# Patient Record
Sex: Female | Born: 1950 | Race: White | Hispanic: No | Smoking: Never smoker
Health system: Southern US, Community
[De-identification: ages and names within clinical notes are randomized; demographics above are authoritative.]

## PROBLEM LIST (undated history)

## (undated) DIAGNOSIS — E119 Type 2 diabetes mellitus without complications: Secondary | ICD-10-CM

## (undated) DIAGNOSIS — T148XXA Other injury of unspecified body region, initial encounter: Secondary | ICD-10-CM

## (undated) DIAGNOSIS — S42202A Unspecified fracture of upper end of left humerus, initial encounter for closed fracture: Secondary | ICD-10-CM

## (undated) DIAGNOSIS — K08109 Complete loss of teeth, unspecified cause, unspecified class: Secondary | ICD-10-CM

## (undated) DIAGNOSIS — Z973 Presence of spectacles and contact lenses: Secondary | ICD-10-CM

## (undated) DIAGNOSIS — R112 Nausea with vomiting, unspecified: Secondary | ICD-10-CM

## (undated) DIAGNOSIS — Z9889 Other specified postprocedural states: Secondary | ICD-10-CM

## (undated) DIAGNOSIS — Z972 Presence of dental prosthetic device (complete) (partial): Secondary | ICD-10-CM

## (undated) DIAGNOSIS — M792 Neuralgia and neuritis, unspecified: Secondary | ICD-10-CM

## (undated) DIAGNOSIS — T8859XA Other complications of anesthesia, initial encounter: Secondary | ICD-10-CM

## (undated) DIAGNOSIS — I509 Heart failure, unspecified: Secondary | ICD-10-CM

## (undated) DIAGNOSIS — I2510 Atherosclerotic heart disease of native coronary artery without angina pectoris: Secondary | ICD-10-CM

## (undated) DIAGNOSIS — F410 Panic disorder [episodic paroxysmal anxiety] without agoraphobia: Secondary | ICD-10-CM

## (undated) DIAGNOSIS — I219 Acute myocardial infarction, unspecified: Secondary | ICD-10-CM

## (undated) DIAGNOSIS — T4145XA Adverse effect of unspecified anesthetic, initial encounter: Secondary | ICD-10-CM

## (undated) DIAGNOSIS — C801 Malignant (primary) neoplasm, unspecified: Secondary | ICD-10-CM

## (undated) DIAGNOSIS — S88111A Complete traumatic amputation at level between knee and ankle, right lower leg, initial encounter: Secondary | ICD-10-CM

## (undated) HISTORY — PX: TUBAL LIGATION: SHX77

## (undated) HISTORY — PX: CARDIAC CATHETERIZATION: SHX172

## (undated) HISTORY — PX: MULTIPLE TOOTH EXTRACTIONS: SHX2053

## (undated) HISTORY — PX: COLON SURGERY: SHX602

## (undated) HISTORY — DX: Malignant (primary) neoplasm, unspecified: C80.1

## (undated) HISTORY — PX: COLONOSCOPY: SHX174

## (undated) HISTORY — PX: CHOLECYSTECTOMY: SHX55

## (undated) HISTORY — PX: APPENDECTOMY: SHX54

## (undated) HISTORY — PX: DILATION AND CURETTAGE OF UTERUS: SHX78

## (undated) HISTORY — PX: EYE SURGERY: SHX253

---

## 2003-04-06 ENCOUNTER — Inpatient Hospital Stay (HOSPITAL_COMMUNITY): Admission: EM | Admit: 2003-04-06 | Discharge: 2003-04-09 | Payer: Self-pay | Admitting: Emergency Medicine

## 2003-04-06 ENCOUNTER — Encounter: Payer: Self-pay | Admitting: Emergency Medicine

## 2003-04-07 ENCOUNTER — Encounter: Payer: Self-pay | Admitting: *Deleted

## 2003-04-08 ENCOUNTER — Encounter: Payer: Self-pay | Admitting: *Deleted

## 2005-12-28 HISTORY — PX: OTHER SURGICAL HISTORY: SHX169

## 2006-09-06 ENCOUNTER — Ambulatory Visit: Payer: Self-pay | Admitting: Cardiovascular Disease

## 2006-10-04 ENCOUNTER — Ambulatory Visit: Payer: Self-pay | Admitting: Anesthesiology

## 2006-10-14 ENCOUNTER — Ambulatory Visit: Payer: Self-pay | Admitting: Anesthesiology

## 2006-11-11 ENCOUNTER — Ambulatory Visit: Payer: Self-pay | Admitting: Anesthesiology

## 2007-01-06 ENCOUNTER — Ambulatory Visit: Payer: Self-pay | Admitting: Anesthesiology

## 2007-01-31 ENCOUNTER — Ambulatory Visit: Payer: Self-pay | Admitting: Anesthesiology

## 2007-02-01 ENCOUNTER — Other Ambulatory Visit: Payer: Self-pay

## 2007-02-01 ENCOUNTER — Inpatient Hospital Stay: Payer: Self-pay | Admitting: Internal Medicine

## 2007-03-03 ENCOUNTER — Ambulatory Visit: Payer: Self-pay | Admitting: Anesthesiology

## 2007-05-03 ENCOUNTER — Ambulatory Visit: Payer: Self-pay | Admitting: Anesthesiology

## 2007-05-31 ENCOUNTER — Ambulatory Visit: Payer: Self-pay | Admitting: Anesthesiology

## 2007-06-28 ENCOUNTER — Ambulatory Visit: Payer: Self-pay | Admitting: Anesthesiology

## 2007-08-17 ENCOUNTER — Ambulatory Visit: Payer: Self-pay | Admitting: Anesthesiology

## 2007-10-03 ENCOUNTER — Ambulatory Visit: Payer: Self-pay | Admitting: Anesthesiology

## 2007-12-19 ENCOUNTER — Ambulatory Visit: Payer: Self-pay | Admitting: Anesthesiology

## 2008-03-14 ENCOUNTER — Ambulatory Visit: Payer: Self-pay | Admitting: Gastroenterology

## 2008-04-17 ENCOUNTER — Ambulatory Visit: Payer: Self-pay | Admitting: Anesthesiology

## 2008-04-23 ENCOUNTER — Ambulatory Visit: Payer: Self-pay | Admitting: Cardiovascular Disease

## 2008-07-24 ENCOUNTER — Ambulatory Visit: Payer: Self-pay | Admitting: Anesthesiology

## 2008-10-17 ENCOUNTER — Ambulatory Visit: Payer: Self-pay | Admitting: Anesthesiology

## 2008-12-31 ENCOUNTER — Ambulatory Visit: Payer: Self-pay | Admitting: Oncology

## 2009-01-02 LAB — CMP (CANCER CENTER ONLY)
ALT(SGPT): 44 U/L (ref 10–47)
Alkaline Phosphatase: 120 U/L — ABNORMAL HIGH (ref 26–84)
Sodium: 138 mEq/L (ref 128–145)
Total Bilirubin: 0.5 mg/dl (ref 0.20–1.60)
Total Protein: 8 g/dL (ref 6.4–8.1)

## 2009-01-02 LAB — CBC WITH DIFFERENTIAL (CANCER CENTER ONLY)
BASO#: 0.1 10*3/uL (ref 0.0–0.2)
HCT: 40.6 % (ref 34.8–46.6)
HGB: 13.3 g/dL (ref 11.6–15.9)
LYMPH#: 3.4 10*3/uL — ABNORMAL HIGH (ref 0.9–3.3)
LYMPH%: 43.5 % (ref 14.0–48.0)
MCV: 86 fL (ref 81–101)
MONO#: 0.4 10*3/uL (ref 0.1–0.9)
NEUT%: 48.4 % (ref 39.6–80.0)
RDW: 12.1 % (ref 10.5–14.6)
WBC: 7.9 10*3/uL (ref 3.9–10.0)

## 2009-01-04 LAB — COMPREHENSIVE METABOLIC PANEL
Alkaline Phosphatase: 114 U/L (ref 39–117)
Creatinine, Ser: 1.35 mg/dL — ABNORMAL HIGH (ref 0.40–1.20)
Glucose, Bld: 360 mg/dL — ABNORMAL HIGH (ref 70–99)
Sodium: 133 mEq/L — ABNORMAL LOW (ref 135–145)
Total Bilirubin: 0.4 mg/dL (ref 0.3–1.2)
Total Protein: 7.7 g/dL (ref 6.0–8.3)

## 2009-01-04 LAB — SPEP & IFE WITH QIG
Alpha-2-Globulin: 11.6 % (ref 7.1–11.8)
Beta 2: 7.9 % — ABNORMAL HIGH (ref 3.2–6.5)
Gamma Globulin: 16.8 % (ref 11.1–18.8)
IgG (Immunoglobin G), Serum: 1480 mg/dL (ref 694–1618)
IgM, Serum: 71 mg/dL (ref 60–263)

## 2009-01-04 LAB — BETA 2 MICROGLOBULIN, SERUM: Beta-2 Microglobulin: 3.42 mg/L — ABNORMAL HIGH (ref 1.01–1.73)

## 2009-01-04 LAB — KAPPA/LAMBDA LIGHT CHAINS: Kappa free light chain: 3.87 mg/dL — ABNORMAL HIGH (ref 0.33–1.94)

## 2009-01-07 ENCOUNTER — Ambulatory Visit: Payer: Self-pay | Admitting: Anesthesiology

## 2009-01-15 LAB — UIFE/LIGHT CHAINS/TP QN, 24-HR UR
Albumin, U: DETECTED
Alpha 1, Urine: DETECTED — AB
Free Kappa/Lambda Ratio: 8.14 ratio — ABNORMAL HIGH (ref 0.46–4.00)
Free Lambda Excretion/Day: 11.9 mg/d
Free Lambda Lt Chains,Ur: 1.83 mg/dL — ABNORMAL HIGH (ref 0.08–1.01)
Gamma Globulin, Urine: DETECTED — AB
Time: 24 hours
Total Protein, Urine-Ur/day: 138 mg/d (ref 10–140)
Total Protein, Urine: 21.3 mg/dL

## 2009-02-12 ENCOUNTER — Encounter: Payer: Self-pay | Admitting: Oncology

## 2009-02-12 ENCOUNTER — Other Ambulatory Visit: Admission: RE | Admit: 2009-02-12 | Discharge: 2009-02-12 | Payer: Self-pay | Admitting: Oncology

## 2009-02-12 LAB — CBC WITH DIFFERENTIAL (CANCER CENTER ONLY)
BASO%: 0.8 % (ref 0.0–2.0)
Eosinophils Absolute: 0.2 10*3/uL (ref 0.0–0.5)
HCT: 39.2 % (ref 34.8–46.6)
LYMPH#: 2.9 10*3/uL (ref 0.9–3.3)
LYMPH%: 39.1 % (ref 14.0–48.0)
MCV: 85 fL (ref 81–101)
MONO#: 0.4 10*3/uL (ref 0.1–0.9)
NEUT%: 52.4 % (ref 39.6–80.0)
RBC: 4.6 10*6/uL (ref 3.70–5.32)
RDW: 13.1 % (ref 10.5–14.6)
WBC: 7.3 10*3/uL (ref 3.9–10.0)

## 2009-02-12 LAB — BASIC METABOLIC PANEL - CANCER CENTER ONLY
BUN, Bld: 18 mg/dL (ref 7–22)
Calcium: 9.5 mg/dL (ref 8.0–10.3)
Creat: 1.3 mg/dl — ABNORMAL HIGH (ref 0.6–1.2)
Glucose, Bld: 259 mg/dL — ABNORMAL HIGH (ref 73–118)
Sodium: 140 mEq/L (ref 128–145)

## 2009-03-06 ENCOUNTER — Ambulatory Visit: Payer: Self-pay | Admitting: Oncology

## 2009-03-12 LAB — CBC WITH DIFFERENTIAL (CANCER CENTER ONLY)
BASO%: 0.5 % (ref 0.0–2.0)
Eosinophils Absolute: 0.1 10*3/uL (ref 0.0–0.5)
LYMPH#: 2.2 10*3/uL (ref 0.9–3.3)
MONO#: 0.4 10*3/uL (ref 0.1–0.9)
NEUT#: 3.8 10*3/uL (ref 1.5–6.5)
Platelets: 237 10*3/uL (ref 145–400)
RBC: 4.45 10*6/uL (ref 3.70–5.32)
RDW: 14 % (ref 10.5–14.6)
WBC: 6.6 10*3/uL (ref 3.9–10.0)

## 2009-03-12 LAB — BASIC METABOLIC PANEL - CANCER CENTER ONLY
CO2: 28 mEq/L (ref 18–33)
Calcium: 9.5 mg/dL (ref 8.0–10.3)
Creat: 1.3 mg/dl — ABNORMAL HIGH (ref 0.6–1.2)
Sodium: 138 mEq/L (ref 128–145)

## 2009-04-11 ENCOUNTER — Ambulatory Visit: Payer: Self-pay | Admitting: Anesthesiology

## 2009-04-26 ENCOUNTER — Ambulatory Visit: Payer: Self-pay | Admitting: Cardiovascular Disease

## 2009-06-27 ENCOUNTER — Ambulatory Visit: Payer: Self-pay | Admitting: Anesthesiology

## 2009-09-30 ENCOUNTER — Ambulatory Visit: Payer: Self-pay | Admitting: Anesthesiology

## 2009-12-30 ENCOUNTER — Ambulatory Visit: Payer: Self-pay | Admitting: Anesthesiology

## 2010-01-22 ENCOUNTER — Ambulatory Visit: Payer: Self-pay | Admitting: Podiatry

## 2010-01-24 ENCOUNTER — Ambulatory Visit: Payer: Self-pay | Admitting: Podiatry

## 2010-04-01 ENCOUNTER — Ambulatory Visit: Payer: Self-pay | Admitting: Anesthesiology

## 2010-04-25 ENCOUNTER — Ambulatory Visit: Payer: Self-pay | Admitting: Podiatry

## 2010-05-16 ENCOUNTER — Ambulatory Visit: Payer: Self-pay | Admitting: Podiatry

## 2010-05-19 ENCOUNTER — Ambulatory Visit: Payer: Self-pay | Admitting: Vascular Surgery

## 2010-07-24 ENCOUNTER — Ambulatory Visit: Payer: Self-pay | Admitting: Anesthesiology

## 2010-10-30 ENCOUNTER — Ambulatory Visit: Payer: Self-pay | Admitting: Anesthesiology

## 2010-12-28 DIAGNOSIS — C801 Malignant (primary) neoplasm, unspecified: Secondary | ICD-10-CM

## 2010-12-28 HISTORY — PX: BREAST SURGERY: SHX581

## 2010-12-28 HISTORY — DX: Malignant (primary) neoplasm, unspecified: C80.1

## 2010-12-28 HISTORY — PX: MASTECTOMY: SHX3

## 2011-02-02 ENCOUNTER — Ambulatory Visit: Payer: Medicare Other | Admitting: Anesthesiology

## 2011-03-04 ENCOUNTER — Ambulatory Visit: Payer: Self-pay | Admitting: Emergency Medicine

## 2011-03-06 ENCOUNTER — Ambulatory Visit: Payer: Self-pay | Admitting: Emergency Medicine

## 2011-03-10 LAB — PATHOLOGY REPORT

## 2011-03-19 ENCOUNTER — Ambulatory Visit: Payer: Self-pay | Admitting: Emergency Medicine

## 2011-04-14 LAB — TISSUE HYBRIDIZATION (BONE MARROW)-NCBH

## 2011-04-27 ENCOUNTER — Ambulatory Visit: Payer: Self-pay | Admitting: Anesthesiology

## 2011-05-15 NOTE — H&P (Signed)
NAME:  [name], [name]                    ACCOUNT NO.:  [id number]   MEDICAL RECORD NO.:  [id number]                   PATIENT TYPE:  EMS   LOCATION:  MAJO                                 FACILITY:  [facility]   PHYSICIAN:  Althea Grimmer. Luther Parody, M.D.            DATE OF BIRTH:  April 14, 1951   DATE OF ADMISSION:  04/06/2003  DATE OF DISCHARGE:                                HISTORY & PHYSICAL   HISTORY OF PRESENT ILLNESS:  The patient is a 60 year old female who  presented to the emergency room complaining of severe right lower quadrant  pain radiating into her back.  She states this was so severe she was doubled  over and could not stand up.  She has a history of two to three months of  chronic right lower quadrant pain, but it has never been as bad as today.  She underwent a colonoscopy yesterday at [facility] and she  was told that it was normal.  Reportedly there were no biopsies or  polypectomies.  She says that eating has worsened her right lower quadrant  pain.  She feels nauseous, but she is not vomiting.  She has not lost any  weight.  She has chronic longstanding constipation and moves her bowels only  once ever week to two weeks.  She has not seen any blood in the stool.  She  reports never have had an upper endoscopy or small bowel series.  She has  not moved her bowels today, but that is to be expected since she just had a  colonoscopy yesterday and was cleaned out.  In the emergency room, she had  an abdominal CT scan that was fairly unrevealing.  There was a 5 cm cystic  structure in the right adnexal area that was felt to be an ovarian cyst,  however, follow-up ultrasound reveals that this is not the ovary, but  probably a fluid-filled bowel loop.  CBC, urinalysis, electrolytes, amylase,  lipase, and liver function are all normal.  Her blood sugar was 205.   PAST MEDICAL HISTORY:  Pertinent for diabetes mellitus with peripheral  neuropathy and for  hypertension.   PAST SURGICAL HISTORY:  Cholecystectomy and appendectomy prior to a bowel  obstruction requiring surgery in 1982.  She also had a tubal ligation.   CURRENT MEDICATIONS:  1. Insulin 15 units of Regular and 25 units of NPH twice daily before meals.  2. Lisinopril 20 mg daily.  3. Glucophage 1000 mg daily.  4. Neurontin, questionable dose t.i.d.   ALLERGIES:  No known drug allergies.   FAMILY HISTORY:  Negative for inflammatory bowel disease or colorectal  neoplasia.  Her son was operated on for a volvulus.   SOCIAL HISTORY:  She is widowed.  She works as an Sales executive.  She does not  smoke.  She drinks minimal alcohol.  She was coming here for the weekend to  celebrate her son's birthday.   PHYSICAL EXAMINATION:  GENERAL APPEARANCE:  She is a well-developed, well-  nourished, adult female appearing in some discomfort despite pain  medications.  VITAL SIGNS:  Afebrile.  Blood pressure 106/56, pulse 88 and regular.  SKIN:  Normal.  HEENT:  The eyes are anicteric.  The oropharynx is unremarkable.  NECK:  Supple without thyromegaly.  There is no cervical or inguinal  adenopathy.  CHEST:  Sounds clear.  HEART:  Regular rate and rhythm without murmurs, rubs, or gallops.  ABDOMEN:  Questionable mildly distended.  Bowel sounds are present.  There  is mild to moderate tenderness to deep palpation in the right lower  quadrant.  There is no rebound.  I do not appreciate any masses.  RECTAL:  Exam is not performed.  EXTREMITIES:  Without cyanosis, clubbing, or edema or rash.  Dorsalis pedis  pulses 2+ bilaterally.  Sensation in the feet is intact to light touch.   IMPRESSION:  A 60 year old female with right lower quadrant pain radiating  to her back of unclear etiology.  This is not likely due to luminal colonic  pathology if she had a normal colonoscopy yesterday.  I also doubt that it  is a complication of colonoscopy if no biopsies were done and the CT does  not reveal  any problem.  Leading diagnostic possibilities include functional  pain from constipation/irritable bowel syndrome, though onset at her age is  not usual, ischemic bowel, an internal hernia, or possibly a diabetic  nephropathy of the gut or abdominal wall.   PLAN:  The patient will be admitted and kept on an IV and given pain  medications.  I have asked the surgeons to consult on her case in the  morning.  I will repeat her abdominal x-ray series along with CBC,  sedimentation rate, and LDH.                                               Althea Grimmer. Luther Parody, M.D.    PJS/MEDQ  D:  04/06/2003  T:  04/07/2003  Job:  [id number]

## 2011-05-15 NOTE — Discharge Summary (Signed)
[name], [name]                    ACCOUNT NO.:  [id number]   MEDICAL RECORD NO.:  [id number]                   PATIENT TYPE:  INP   LOCATION:  5741                                 FACILITY:  [facility]   PHYSICIAN:  Althea Grimmer. Luther Parody, M.D.            DATE OF BIRTH:  08/09/1951   DATE OF ADMISSION:  04/06/2003  DATE OF DISCHARGE:  04/09/2003                                 DISCHARGE SUMMARY   DISCHARGE DIAGNOSES:  1. Right lower quadrant abdominal pain likely secondary to constipation     prone irritable bowel syndrome.  2. Diabetes mellitus insulin-requiring.  3. Hypertension.  4. Peripheral neuropathy.   HISTORY OF PRESENT ILLNESS:  A 60 year old female who presented to the  emergency room complaining of severe right lower quadrant pain radiating  into the back.  She had been having pain for two to three months but it  became severe today.  The day prior to the admission she had had a  colonoscopy at [facility] that she reports was negative.  For  the remainder of her history, please see the admission note.   PHYSICAL EXAMINATION:  Physical exam on admission was pertinent for normal  vital signs and no fever.  Abdomen was questionably mildly distended with  bowel sounds present.  There was mild to moderate tenderness to deep  palpation in the right lower quadrant.  There was no rebound.   HOSPITAL COURSE:  Abdominal CT scan was performed which revealed a possible  right lower quadrant fluid filled cystic structure.  Follow up transvaginal  and pelvis ultrasound suggested this was a fluid filled loop of bowel and  not related to gynecologic structures.  A repeat CT scan two days later with  IV and oral contrast was completely unremarkable.  The patient's pain  gradually diminished.  CBC, metabolic profile and urinalysis were negative.  Sedimentation rate was 35.  Consultation was obtained with Gabrielle Dare.  Janee Morn, M.D. of general surgery, who agreed that  exam was unremarkable and  there was no significant pathology to be addressed by laparotomy or  laparoscopy.  He felt the pain was probably functional.  On the morning of  discharge, the patient was feeling somewhat better and tolerating regular  diet.   DISCHARGE CONDITION:  Improved.   FOLLOW UP:  With her physician in [place].   DISCHARGE MEDICATIONS:  1. Insulin 15 units of regular and 25 units of NPH b.i.d. before meals.  2. Lisinopril 20 mg q.d.  3.     Glucophage 1000 mg q.d.  4. Neurontin, resume home dose t.i.d. as previously.  5. To this I have added Zelnorm 6 mg b.i.d. and NuLev 0.125 mg one or two on     her tongue p.r.n. pain.  Althea Grimmer. Luther Parody, M.D.    PJS/MEDQ  D:  04/09/2003  T:  04/09/2003  Job:  [id number]   cc:   Gabrielle Dare. Janee Morn, M.D.  [facility] Surgery  [address] [place], [place] [address]  Fax: [phone]

## 2011-05-15 NOTE — Consult Note (Signed)
NAME:  [name], WARRIOR                    ACCOUNT NO.:  [id number]   MEDICAL RECORD NO.:  [id number]                   PATIENT TYPE:  INP   LOCATION:  5741                                 FACILITY:  [facility]   PHYSICIAN:  Gabrielle Dare. Janee Morn, M.D.             DATE OF BIRTH:  1951/05/12   DATE OF CONSULTATION:  04/07/2003  DATE OF DISCHARGE:                                   CONSULTATION   REFERRING PHYSICIAN:  Althea Grimmer. Santogade, M.D.   CHIEF COMPLAINT:  Right lower quadrant abdominal pain.   HISTORY OF PRESENT ILLNESS:  The patient is a 60 year old white female with  a two to three-month history of persistent waxing and waning right lower  quadrant abdominal pain.  She has undergone work-up for this over the past  two months at the [facility], including a colonoscopy done at  that location two days ago, which reported demonstrated no abnormalities in  the colon.  No biopsies were done at that time by report.  She is continuing  to have this pain.  She is in [place], [place], to visit her son.  She came in to be evaluated in the emergency department and was admitted by  Althea Grimmer. Luther Parody, M.D.  Currently the patient still complains of this  crampy right lower quadrant abdominal pain that is coming and going, but  never totally leaves.  She denies any diarrhea or blood per rectum.  She has  been eating, but only small amounts as she has not had much of an appetite.  No other current complaints.   PAST MEDICAL HISTORY:  1. Diabetes mellitus.  2. Hypertension.   PAST SURGICAL HISTORY:  1. Appendectomy.  2. Cholecystectomy.  3. __________ for bowel obstruction.  4. Tubal ligation.   ALLERGIES:  No known drug allergies.   FAMILY HISTORY:  Her mother has diabetes mellitus.   MEDICATIONS:  Medications in the hospital include the following:  1. Demerol.  2. Ambien.  3. Tylenol.  4. Zelnorm.  5. Lisinopril.  6. Glucophage.  7. NPH Insulin.  8.  Neurontin.  9. Reglan.   REVIEW OF SYSTEMS:  GENERAL:  She feels a little bit weak.  CARDIOVASCULAR:  No complaints.  PULMONARY:  No complaints.  GASTROINTESTINAL:  Please see  the history of present illness.  GENITOURINARY:  She denies any complaints.  The rest of the review of systems is negative.   PHYSICAL EXAMINATION:  VITAL SIGNS:  Temperature 98.2 degrees, pulse 77,  respirations 16, blood pressure 90/48, 98% saturations on room air.  NECK:  Supple without any adenopathy.  HEENT:  Extraocular movements are intact.  Pupils are equal, round, and  reactive.  Ears, nose, and throat with moist mucous membranes.  LUNGS:  Clear to auscultation bilaterally.  CARDIOVASCULAR:  The heart has a regular rate and rhythm.  Distal pulses are  2+.  ABDOMEN:  Soft.  No masses are palpable.  She is not  tender in the area of  the pain on palpation.  No incisional hernias are palpable.  SKIN:  Warm and dry.  EXTREMITIES:  Strength is equal and intact.   LABORATORY DATA:  White blood cell count 11.6, hemoglobin 13.6, hematocrit  39.3, platelets 307.  Sodium 137, potassium 3.6, chloride 106, CO2 23, BUN  11, creatinine 0.7, glucose 205, AST 18, ALT 16, alkaline phosphatase 106,  bilirubin 0.5, amylase 18, lipase 21.  CT scan of the abdomen was reviewed  with the radiologist, which demonstrates what appears to be a 5 cm right  adnexal cyst which appears to be within the fallopian tube.  On the CT scan,  there is no evidence of bowel obstruction, although there is no p.o. or IV  contrast given.   ASSESSMENT:  A 60 year old white female with a two to three-month history of  right lower quadrant abdominal pain and questionable adnexal mass on the CT  scan which was read as a small bowel loop on ultrasound.  No emergent  surgical intervention is necessary.   PLAN:  1. I will discuss with Althea Grimmer. Luther Parody, M.D., regarding possible GYN     evaluation.  2. If this pain is persisting until tomorrow,  will plan to do a laparoscopic     exploration in the operating room to see if I can further delineate what     is causing the patient's pain.  This was discussed in full with the     patient, including the risks and benefits of that procedure.  We will     make a decision in proceeding with that tomorrow.   Thank you very much for this consultation.                                               Gabrielle Dare Janee Morn, M.D.    BET/MEDQ  D:  04/07/2003  T:  04/08/2003  Job:  [id number]

## 2011-07-14 ENCOUNTER — Ambulatory Visit: Payer: Self-pay | Admitting: Anesthesiology

## 2011-10-21 ENCOUNTER — Ambulatory Visit: Payer: Self-pay | Admitting: Anesthesiology

## 2012-01-11 ENCOUNTER — Ambulatory Visit: Payer: Self-pay | Admitting: Anesthesiology

## 2012-04-05 ENCOUNTER — Ambulatory Visit: Payer: Self-pay | Admitting: Anesthesiology

## 2012-07-12 ENCOUNTER — Ambulatory Visit: Payer: Self-pay | Admitting: Anesthesiology

## 2012-07-12 IMAGING — US US OUTSIDE FILMS BREAST
1 series · 16 of 16 positions shown · non-contrast
Comparison: none

[Series 1: us outside films breast · 16 of 16 slices shown]
[im 1/16]
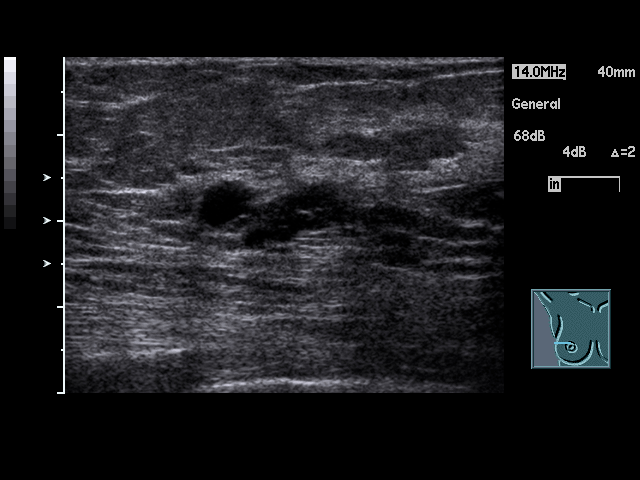
[im 2/16]
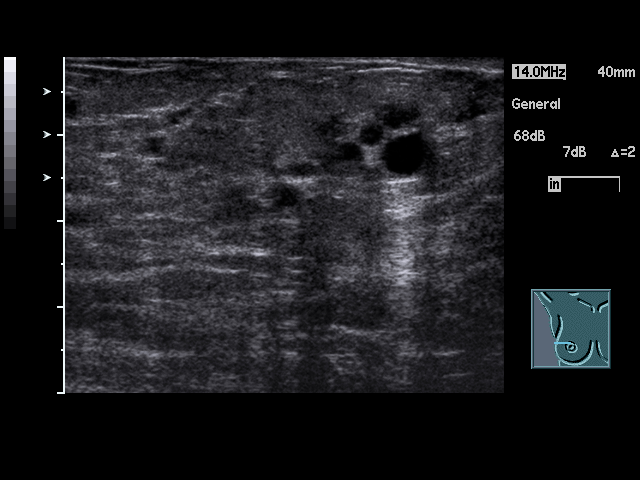
[im 3/16]
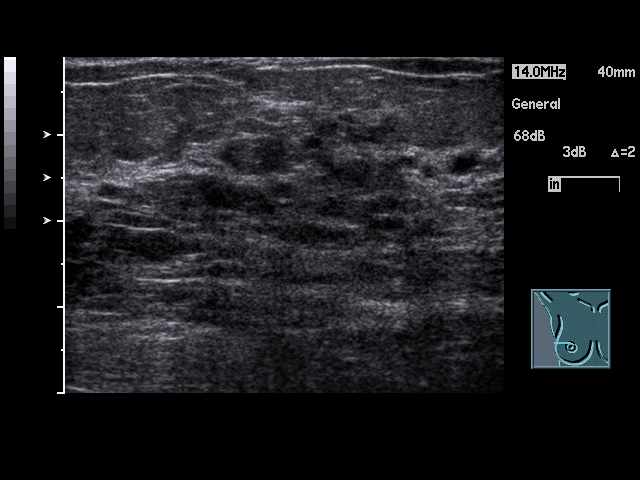
[im 4/16]
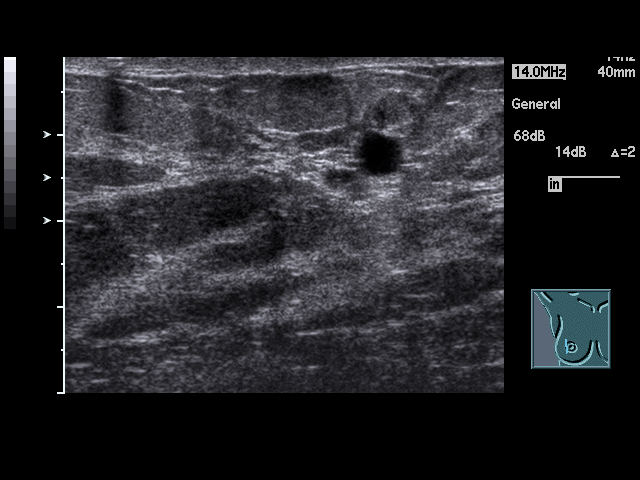
[im 5/16]
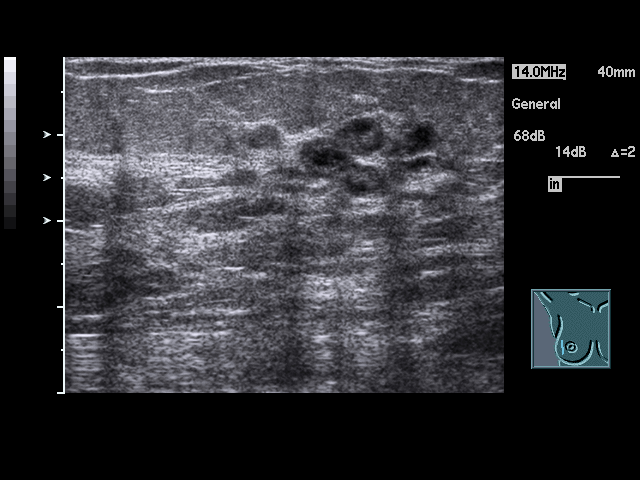
[im 6/16]
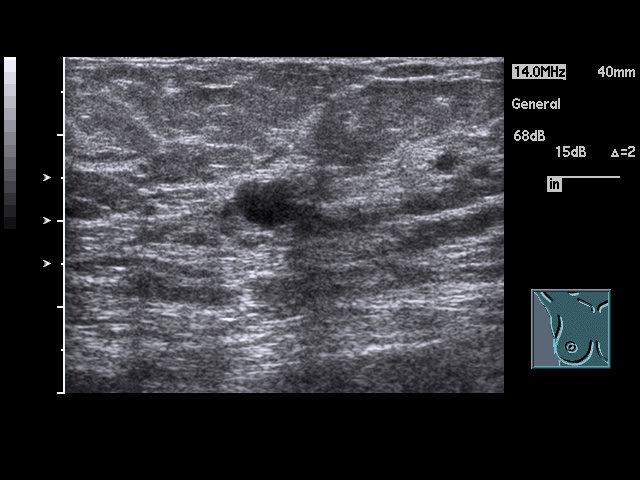
[im 7/16]
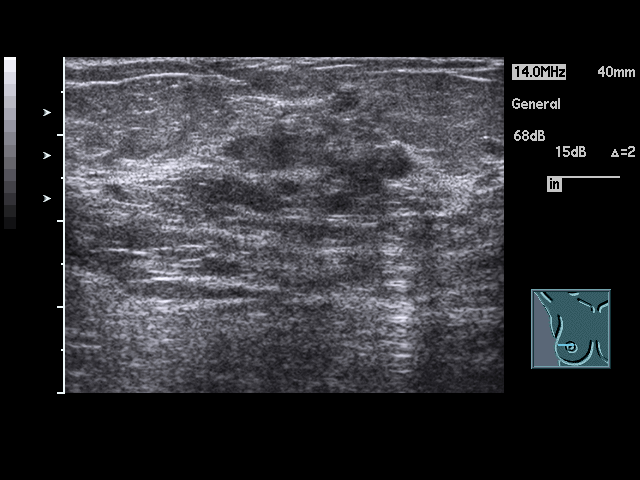
[im 8/16]
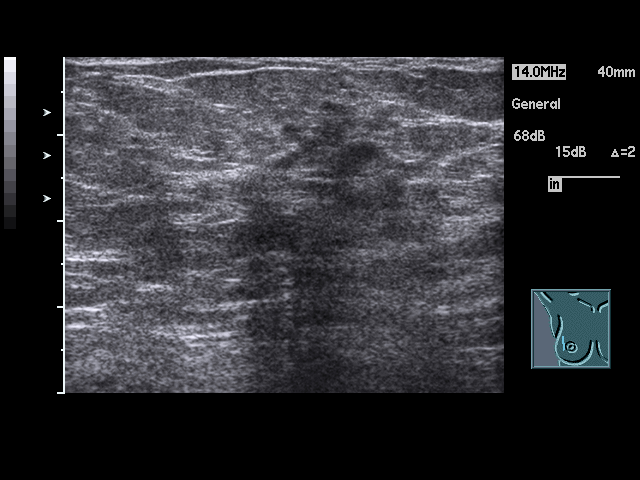
[im 9/16]
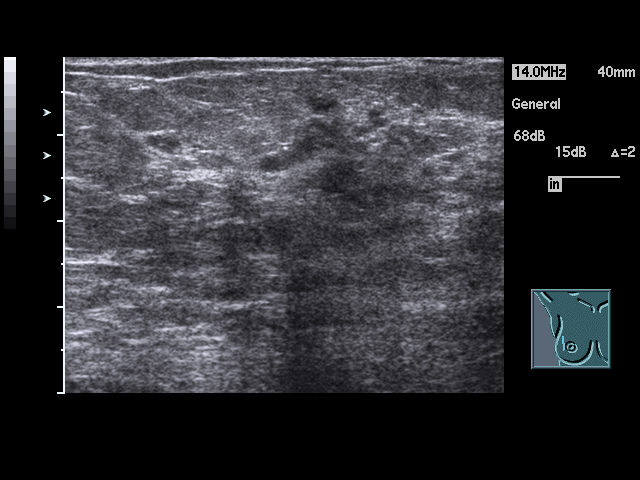
[im 10/16]
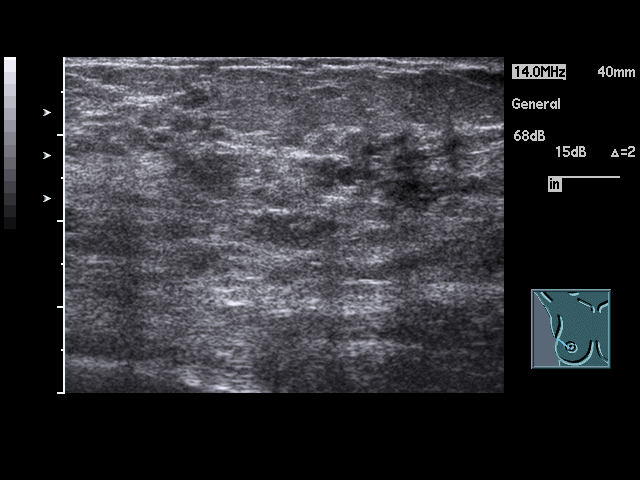
[im 11/16]
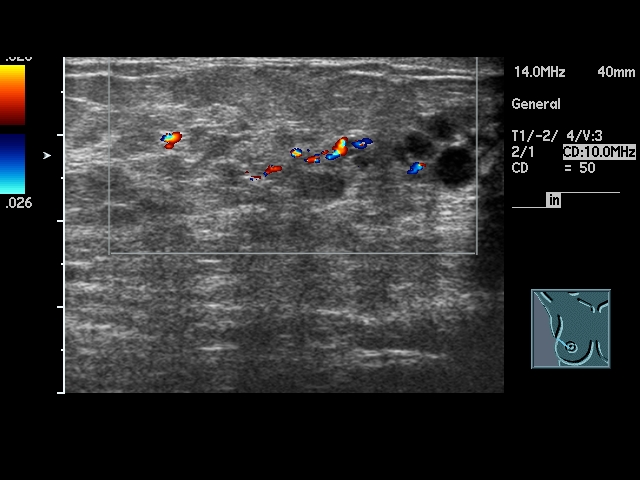
[im 12/16]
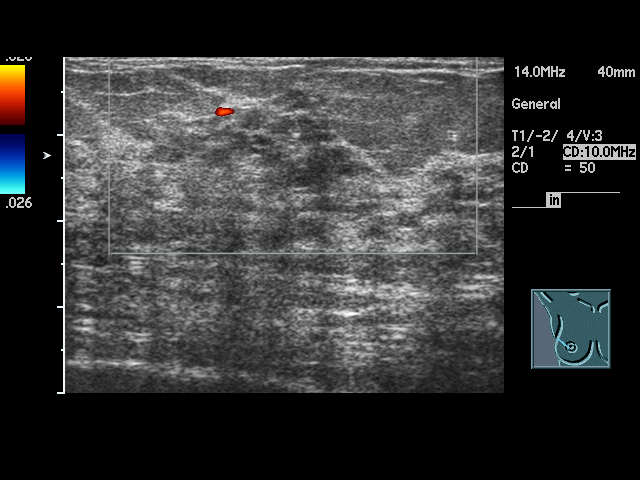
[im 13/16]
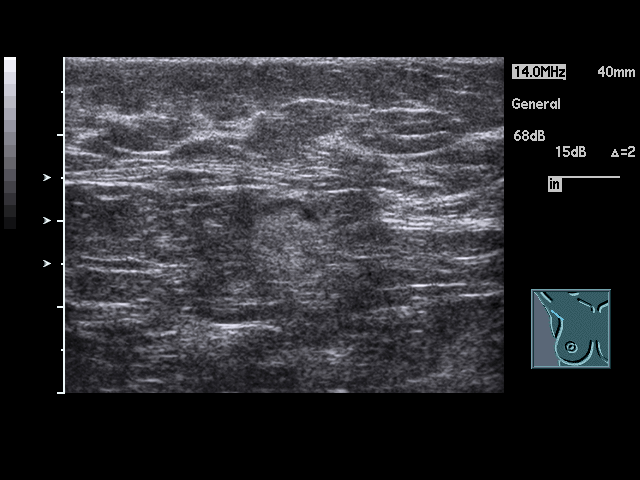
[im 14/16]
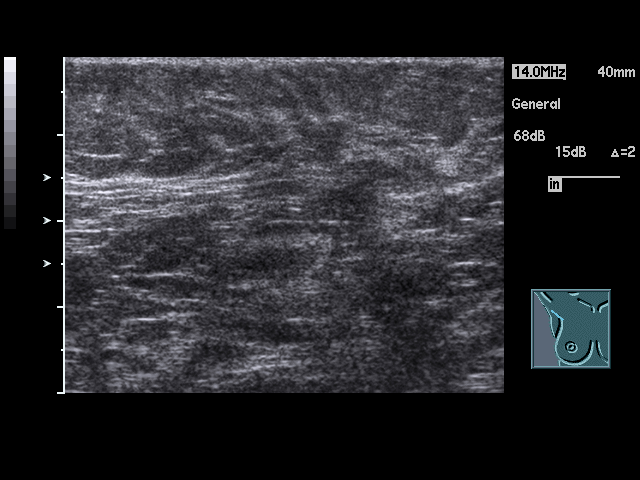
[im 15/16]
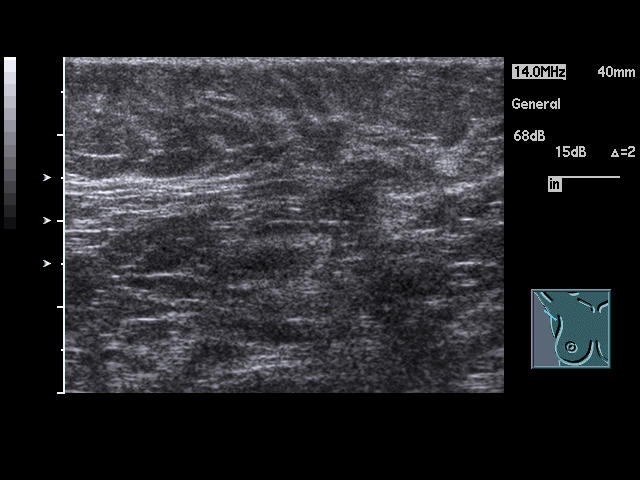
[im 16/16]
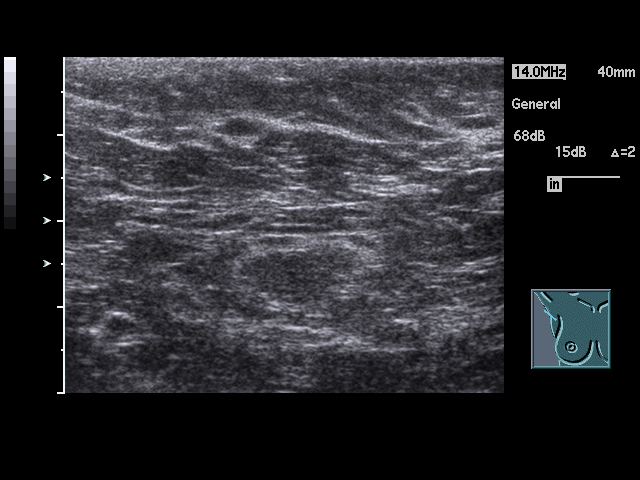

[16 of 16 positions shown; findings below may reference images not displayed]

IMAGES IMPORTED FROM THE SYNGO WORKFLOW SYSTEM
NO DICTATION FOR STUDY

## 2012-07-30 IMAGING — US US NEEDLE LOCALIZATION*R*
1 series · 3 of 3 positions shown · non-contrast
Comparison: none

REASON FOR EXAM: rt breast bx with US NL  06064 surgery at 369pm  [DATE]
COMMENTS:

[Series 1: us needle localization*right* · 3 of 3 slices shown]
[im 1/3]
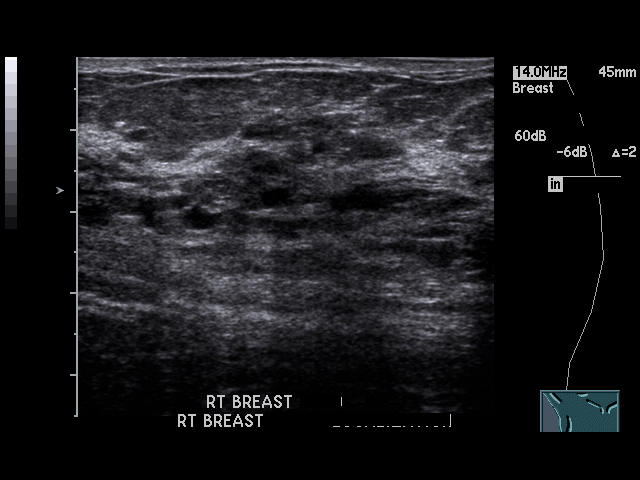
[im 2/3]
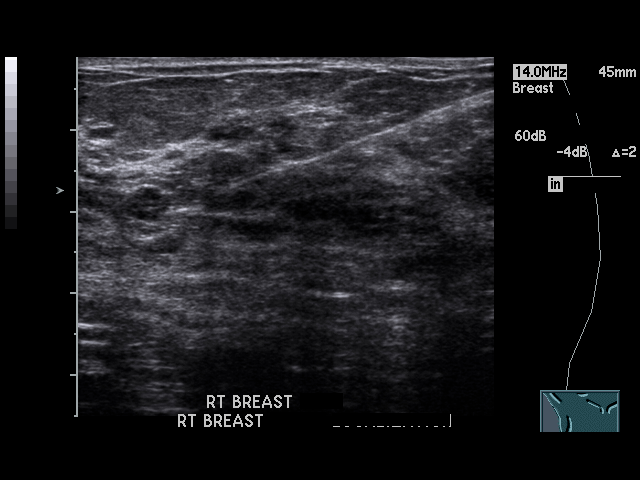
[im 3/3]
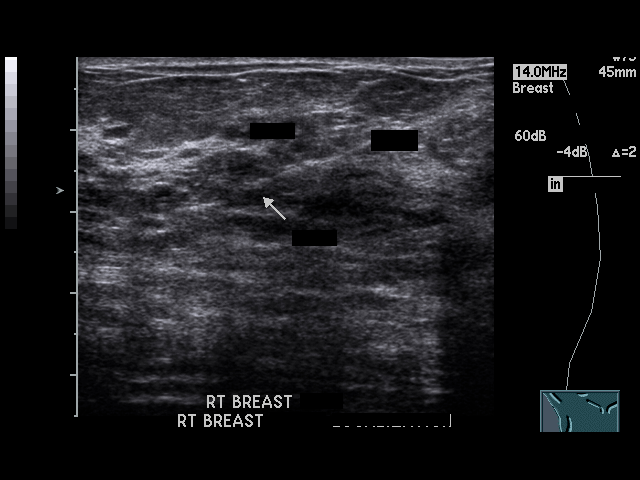

[3 of 3 positions shown; findings below may reference images not displayed]

PROCEDURE:     US  - US GUIDED NEEDLE LOCAL R BREAST  - March 06, 2011 [DATE]

RESULT:     The patient's outside studies from [HOSPITAL] were reviewed. The area
of ill-defined abnormal echogenicity superolateral to the nipple on the
right was demonstrated on today's ultrasound prior to initiating the
procedure. Deep to this an area of palpable abnormality was demonstrated.
The anticipated procedure was discussed with Ms. Alexandre Francisco. She voiced
her willingness to proceed. There was no contraindication to the use of
cutaneous iodine or subcutaneous lidocaine. A timeout procedure was called
and the skin of the right breast was marked with an indelible marker.

The skin over the upper outer aspect of the breast was cleansed with an
iodine solution and the subcutaneous tissues were infiltrated with
approximately 3 cc of 1% buffered lidocaine. Subsequently under ultrasonic
guidance a 7 cm Agnes needle was introduced into the breast using a
lateral approach. Positioning was confirmed and the carrier needle withdrawn
leaving the hookwire in the middle of the ill-defined mass. The patient
tolerated the procedure well.
IMPRESSION: The patient underwent successful wire localization of an
ill-defined area of heterogeneous echogenicity with some distal shadowing
associated with a palpable nodule at approximately the [DATE] position of the
right breast.

## 2012-10-10 ENCOUNTER — Ambulatory Visit: Payer: Self-pay | Admitting: Anesthesiology

## 2013-01-03 ENCOUNTER — Emergency Department: Payer: Self-pay | Admitting: Emergency Medicine

## 2013-01-03 LAB — BASIC METABOLIC PANEL
Anion Gap: 11 (ref 7–16)
BUN: 13 mg/dL (ref 7–18)
Calcium, Total: 9 mg/dL (ref 8.5–10.1)
Chloride: 106 mmol/L (ref 98–107)
Co2: 22 mmol/L (ref 21–32)
Creatinine: 1.17 mg/dL (ref 0.60–1.30)
EGFR (Non-African Amer.): 50 — ABNORMAL LOW
Glucose: 92 mg/dL (ref 65–99)
Osmolality: 277 (ref 275–301)

## 2013-01-03 LAB — CBC
HGB: 14.3 g/dL (ref 12.0–16.0)
MCH: 30.3 pg (ref 26.0–34.0)
MCV: 91 fL (ref 80–100)
Platelet: 250 10*3/uL (ref 150–440)
RBC: 4.72 10*6/uL (ref 3.80–5.20)
RDW: 13.7 % (ref 11.5–14.5)
WBC: 14.3 10*3/uL — ABNORMAL HIGH (ref 3.6–11.0)

## 2013-01-03 LAB — CK TOTAL AND CKMB (NOT AT ARMC): CK-MB: 3.6 ng/mL (ref 0.5–3.6)

## 2013-01-04 ENCOUNTER — Ambulatory Visit: Payer: Self-pay | Admitting: Anesthesiology

## 2013-06-27 ENCOUNTER — Ambulatory Visit: Payer: Self-pay | Admitting: Anesthesiology

## 2013-09-25 ENCOUNTER — Emergency Department: Payer: Self-pay | Admitting: Emergency Medicine

## 2013-10-12 ENCOUNTER — Emergency Department (HOSPITAL_COMMUNITY)
Admission: EM | Admit: 2013-10-12 | Discharge: 2013-10-13 | Disposition: A | Discharge status: Home / Self Care | Payer: Medicare Other | Attending: Emergency Medicine | Admitting: Emergency Medicine

## 2013-10-12 ENCOUNTER — Encounter (HOSPITAL_COMMUNITY): Payer: Self-pay | Admitting: Emergency Medicine

## 2013-10-12 DIAGNOSIS — R42 Dizziness and giddiness: Secondary | ICD-10-CM | POA: Insufficient documentation

## 2013-10-12 DIAGNOSIS — R3589 Other polyuria: Secondary | ICD-10-CM | POA: Insufficient documentation

## 2013-10-12 DIAGNOSIS — X58XXXA Exposure to other specified factors, initial encounter: Secondary | ICD-10-CM | POA: Insufficient documentation

## 2013-10-12 DIAGNOSIS — R739 Hyperglycemia, unspecified: Secondary | ICD-10-CM

## 2013-10-12 DIAGNOSIS — E1149 Type 2 diabetes mellitus with other diabetic neurological complication: Secondary | ICD-10-CM | POA: Insufficient documentation

## 2013-10-12 DIAGNOSIS — I252 Old myocardial infarction: Secondary | ICD-10-CM | POA: Insufficient documentation

## 2013-10-12 DIAGNOSIS — R079 Chest pain, unspecified: Secondary | ICD-10-CM | POA: Insufficient documentation

## 2013-10-12 DIAGNOSIS — R109 Unspecified abdominal pain: Secondary | ICD-10-CM | POA: Insufficient documentation

## 2013-10-12 DIAGNOSIS — Y9389 Activity, other specified: Secondary | ICD-10-CM | POA: Insufficient documentation

## 2013-10-12 DIAGNOSIS — Z9861 Coronary angioplasty status: Secondary | ICD-10-CM | POA: Insufficient documentation

## 2013-10-12 DIAGNOSIS — Y9289 Other specified places as the place of occurrence of the external cause: Secondary | ICD-10-CM | POA: Insufficient documentation

## 2013-10-12 DIAGNOSIS — IMO0002 Reserved for concepts with insufficient information to code with codable children: Secondary | ICD-10-CM | POA: Insufficient documentation

## 2013-10-12 DIAGNOSIS — I2510 Atherosclerotic heart disease of native coronary artery without angina pectoris: Secondary | ICD-10-CM | POA: Insufficient documentation

## 2013-10-12 DIAGNOSIS — G909 Disorder of the autonomic nervous system, unspecified: Secondary | ICD-10-CM | POA: Insufficient documentation

## 2013-10-12 DIAGNOSIS — S90819A Abrasion, unspecified foot, initial encounter: Secondary | ICD-10-CM

## 2013-10-12 DIAGNOSIS — R358 Other polyuria: Secondary | ICD-10-CM | POA: Insufficient documentation

## 2013-10-12 HISTORY — DX: Neuralgia and neuritis, unspecified: M79.2

## 2013-10-12 HISTORY — DX: Atherosclerotic heart disease of native coronary artery without angina pectoris: I25.10

## 2013-10-12 HISTORY — DX: Type 2 diabetes mellitus without complications: E11.9

## 2013-10-12 HISTORY — DX: Acute myocardial infarction, unspecified: I21.9

## 2013-10-12 NOTE — ED Notes (Signed)
Patient decided to clean feet with 2 parts water one part bleach for , bleeding and burning to bilat feet, chest pain started after incident.  Patient does have neuropathy of her feet.  Patient was given 324mg  and one nitro and became hypotensive.  Patient with history of RBBB and MI with 3 stents placed.  Patient is chest pain free.

## 2013-10-13 ENCOUNTER — Emergency Department (HOSPITAL_COMMUNITY): Payer: Medicare Other

## 2013-10-13 LAB — CBC WITH DIFFERENTIAL/PLATELET
Basophils Absolute: 0 10*3/uL (ref 0.0–0.1)
Basophils Relative: 0 % (ref 0–1)
HCT: 34.1 % — ABNORMAL LOW (ref 36.0–46.0)
Hemoglobin: 11.8 g/dL — ABNORMAL LOW (ref 12.0–15.0)
Lymphocytes Relative: 24 % (ref 12–46)
MCV: 90.5 fL (ref 78.0–100.0)
Monocytes Relative: 9 % (ref 3–12)
Platelets: 219 10*3/uL (ref 150–400)
RBC: 3.77 MIL/uL — ABNORMAL LOW (ref 3.87–5.11)
RDW: 13.6 % (ref 11.5–15.5)
WBC: 10.9 10*3/uL — ABNORMAL HIGH (ref 4.0–10.5)

## 2013-10-13 LAB — POCT I-STAT, CHEM 8
BUN: 19 mg/dL (ref 6–23)
Calcium, Ion: 1.2 mmol/L (ref 1.13–1.30)
Chloride: 104 mEq/L (ref 96–112)
Potassium: 4.3 mEq/L (ref 3.5–5.1)

## 2013-10-13 LAB — COMPREHENSIVE METABOLIC PANEL
ALT: 30 U/L (ref 0–35)
AST: 35 U/L (ref 0–37)
CO2: 21 mEq/L (ref 19–32)
Chloride: 100 mEq/L (ref 96–112)
Creatinine, Ser: 1.41 mg/dL — ABNORMAL HIGH (ref 0.50–1.10)
GFR calc non Af Amer: 39 mL/min — ABNORMAL LOW (ref >90)
Potassium: 4.2 mEq/L (ref 3.5–5.1)
Sodium: 134 mEq/L — ABNORMAL LOW (ref 135–145)
Total Bilirubin: 0.4 mg/dL (ref 0.3–1.2)
Total Protein: 6.9 g/dL (ref 6.0–8.3)

## 2013-10-13 LAB — URINALYSIS, ROUTINE W REFLEX MICROSCOPIC
Glucose, UA: 100 mg/dL — AB
Hgb urine dipstick: NEGATIVE
Ketones, ur: NEGATIVE mg/dL
Nitrite: NEGATIVE
Protein, ur: NEGATIVE mg/dL
Specific Gravity, Urine: 1.012 (ref 1.005–1.030)
pH: 5 (ref 5.0–8.0)

## 2013-10-13 LAB — URINE MICROSCOPIC-ADD ON

## 2013-10-13 LAB — POCT I-STAT TROPONIN I

## 2013-10-13 LAB — GLUCOSE, CAPILLARY: Glucose-Capillary: 183 mg/dL — ABNORMAL HIGH (ref 70–99)

## 2013-10-13 LAB — CG4 I-STAT (LACTIC ACID): Lactic Acid, Venous: 2.77 mmol/L — ABNORMAL HIGH (ref 0.5–2.2)

## 2013-10-13 LAB — KETONES, QUALITATIVE: Acetone, Bld: NEGATIVE

## 2013-10-13 MED ORDER — SODIUM CHLORIDE 0.9 % IV BOLUS (SEPSIS)
2000.0000 mL | Freq: Once | INTRAVENOUS | Status: AC
  Administered 2013-10-13: 1000 mL via INTRAVENOUS

## 2013-10-13 MED ORDER — FAMOTIDINE IN NACL 20-0.9 MG/50ML-% IV SOLN
20.0000 mg | Freq: Once | INTRAVENOUS | Status: AC
  Administered 2013-10-13: 20 mg via INTRAVENOUS
  Filled 2013-10-13: qty 50

## 2013-10-13 MED ORDER — METOCLOPRAMIDE HCL 5 MG/ML IJ SOLN
10.0000 mg | Freq: Once | INTRAMUSCULAR | Status: AC
  Administered 2013-10-13: 10 mg via INTRAVENOUS
  Filled 2013-10-13: qty 2

## 2013-10-13 MED ORDER — SODIUM CHLORIDE 0.9 % IV SOLN
INTRAVENOUS | Status: DC
  Administered 2013-10-13: 02:00:00 via INTRAVENOUS

## 2013-10-13 MED ORDER — PANTOPRAZOLE SODIUM 40 MG IV SOLR
40.0000 mg | Freq: Once | INTRAVENOUS | Status: AC
  Administered 2013-10-13: 40 mg via INTRAVENOUS
  Filled 2013-10-13: qty 40

## 2013-10-13 MED ORDER — GI COCKTAIL ~~LOC~~
30.0000 mL | Freq: Once | ORAL | Status: AC
  Administered 2013-10-13: 30 mL via ORAL
  Filled 2013-10-13: qty 30

## 2013-10-13 NOTE — ED Provider Notes (Signed)
CSN: [id number]     Arrival date & time 10/12/13  2349 History   First MD Initiated Contact with Patient 10/13/13 0002     Chief Complaint  Patient presents with  . Foot Burn  . Chest Pain   (Consider location/radiation/quality/duration/timing/severity/associated sxs/prior Treatment) HPI This 62 year old female has diabetes with neuropathy in her feet, for the last 6 hours or so she had a gradual onset mild burning discomfort in her chest and upper abdomen, she states all day she has felt lightheaded when she stands up and is a dry mouth polyuria today, she states her blood sugars were running in the 450 range earlier today and 250 this evening, tonight she went to clean her feet so she poured household bleach into some water and put her feet in the container and realized that her feet started bleeding so called EMS who transported her to the emergency department, she is no fever no cough no shortness breath no vomiting no diarrhea no bloody stools no vertigo no altered mental status no change in speech or vision no new focal weakness or numbness and no new numbness to her feet. Her tetanus shot is up-to-date. There is no treatment prior to arrival other than one nitroglycerin from EMS which dropped her systolic blood pressures in the 70s which improved with IV fluids from EMS prior to arrival. The nitroglycerin did not help the burning sensation in her abdomen or chest. Antacids at home did not help the burning sensation either. Past Medical History  Diagnosis Date  . Diabetes mellitus without complication   . Neuropathic pain   . Coronary artery disease   . Myocardial infarct    Past Surgical History  Procedure Laterality Date  . Cardiac stents     History reviewed. No pertinent family history. History  Substance Use Topics  . Smoking status: Never Smoker   . Smokeless tobacco: Not on file  . Alcohol Use: No   OB History   Grav Para Term Preterm Abortions TAB SAB Ect Mult Living               Review of Systems 10 Systems reviewed and are negative for acute change except as noted in the HPI. Allergies  Cymbalta and Toradol  Home Medications   Current Outpatient Rx  Name  Route  Sig  Dispense  Refill  . clindamycin (CLEOCIN) 300 MG capsule   Oral   Take 1 capsule (300 mg total) by mouth 3 (three) times daily. X 10 days   30 capsule   0   . HYDROcodone-acetaminophen (NORCO/VICODIN) 5-325 MG per tablet   Oral   Take 1 tablet by mouth every 4 (four) hours as needed for pain.   15 tablet   0   . silver sulfADIAZINE (SILVADENE) 1 % cream   Topical   Apply topically daily. Apply to affected areas of both feet daily   50 g   1    BP 119/64  Pulse 78  Temp(Src) 98 F (36.7 C) (Oral)  Resp 15  Ht 5\' 9"  (1.753 m)  Wt 185 lb (83.915 kg)  BMI 27.31 kg/m2  SpO2 97% Physical Exam  Nursing note and vitals reviewed. Constitutional:  Awake, alert, nontoxic appearance.  HENT:  Head: Atraumatic.  Eyes: Right eye exhibits no discharge. Left eye exhibits no discharge.  Neck: Neck supple.  Cardiovascular: Normal rate and regular rhythm.   No murmur heard. Pulmonary/Chest: Effort normal and breath sounds normal. No respiratory distress. She has no  wheezes. She has no rales. She exhibits no tenderness.  Abdominal: Soft. Bowel sounds are normal. She exhibits no distension and no mass. There is no tenderness. There is no rebound and no guarding.  Musculoskeletal: She exhibits no edema and no tenderness.  Baseline ROM, no obvious new focal weakness. Both feet have capillary refill less than 2 seconds in the toes dorsalis pedis pulses intact a slight numbness neuropathy, she has some superficial abrasions across the dorsum of both feet with scant superficial bleeding without surrounding erythema or blistering to suggest significant chemical burns  Neurological: She is alert.  Mental status and motor strength appears baseline for patient and situation.  Skin: No rash  noted.  Psychiatric: She has a normal mood and affect.    ED Course  Procedures (including critical care time) Pt stable in ED with no significant deterioration in condition.Patient / Family / Caregiver informed of clinical course, understand medical decision-making process, and agree with plan. Labs Review Labs Reviewed  GLUCOSE, CAPILLARY - Abnormal; Notable for the following:    Glucose-Capillary 183 (*)    All other components within normal limits  CBC WITH DIFFERENTIAL - Abnormal; Notable for the following:    WBC 10.9 (*)    RBC 3.77 (*)    Hemoglobin 11.8 (*)    HCT 34.1 (*)    All other components within normal limits  COMPREHENSIVE METABOLIC PANEL - Abnormal; Notable for the following:    Sodium 134 (*)    Glucose, Bld 182 (*)    Creatinine, Ser 1.41 (*)    Albumin 3.2 (*)    GFR calc non Af Amer 39 (*)    GFR calc Af Amer 45 (*)    All other components within normal limits  URINALYSIS, ROUTINE W REFLEX MICROSCOPIC - Abnormal; Notable for the following:    APPearance CLOUDY (*)    Glucose, UA 100 (*)    Leukocytes, UA SMALL (*)    All other components within normal limits  URINE MICROSCOPIC-ADD ON - Abnormal; Notable for the following:    Bacteria, UA FEW (*)    Crystals CA OXALATE CRYSTALS (*)    All other components within normal limits  POCT I-STAT, CHEM 8 - Abnormal; Notable for the following:    Creatinine, Ser 1.60 (*)    Glucose, Bld 180 (*)    All other components within normal limits  CG4 I-STAT (LACTIC ACID) - Abnormal; Notable for the following:    Lactic Acid, Venous 2.77 (*)    All other components within normal limits  URINE CULTURE  LIPASE, BLOOD  KETONES, QUALITATIVE  POCT I-STAT TROPONIN I   Imaging Review No results found.  EKG Interpretation     Ventricular Rate:  85 PR Interval:  159 QRS Duration: 135 QT Interval:  464 QTC Calculation: 552 R Axis:   -47 Text Interpretation:  Sinus rhythm Consider left atrial enlargement Right  bundle branch block Anterolateral infarct, age indeterminate No previous ECGs available            MDM   1. Foot abrasion, unspecified laterality, initial encounter   2. Abdominal pain   3. Hyperglycemia    I doubt any other EMC precluding discharge at this time including, but not necessarily limited to the following:DKA, significant chemical burns requiring admit.    Hurman Horn, MD 10/29/13 2154

## 2013-10-13 NOTE — ED Notes (Signed)
Time should be 2347

## 2013-10-14 ENCOUNTER — Encounter (HOSPITAL_COMMUNITY): Payer: Self-pay | Admitting: Emergency Medicine

## 2013-10-14 ENCOUNTER — Emergency Department (INDEPENDENT_AMBULATORY_CARE_PROVIDER_SITE_OTHER)
Admission: EM | Admit: 2013-10-14 | Discharge: 2013-10-14 | Disposition: A | Discharge status: Home / Self Care | Payer: Medicare Other | Source: Home / Self Care

## 2013-10-14 DIAGNOSIS — T25429D Corrosion of unspecified degree of unspecified foot, subsequent encounter: Secondary | ICD-10-CM

## 2013-10-14 DIAGNOSIS — E119 Type 2 diabetes mellitus without complications: Secondary | ICD-10-CM

## 2013-10-14 DIAGNOSIS — Z5189 Encounter for other specified aftercare: Secondary | ICD-10-CM

## 2013-10-14 LAB — POCT URINALYSIS DIP (DEVICE)
Ketones, ur: NEGATIVE mg/dL
Protein, ur: NEGATIVE mg/dL
Specific Gravity, Urine: 1.015 (ref 1.005–1.030)
Urobilinogen, UA: 0.2 mg/dL (ref 0.0–1.0)
pH: 6 (ref 5.0–8.0)

## 2013-10-14 LAB — URINE CULTURE: Colony Count: 25000

## 2013-10-14 LAB — POCT I-STAT, CHEM 8
BUN: 14 mg/dL (ref 6–23)
Calcium, Ion: 1.23 mmol/L (ref 1.13–1.30)
Creatinine, Ser: 1.4 mg/dL — ABNORMAL HIGH (ref 0.50–1.10)
Glucose, Bld: 138 mg/dL — ABNORMAL HIGH (ref 70–99)
Hemoglobin: 12.2 g/dL (ref 12.0–15.0)
TCO2: 21 mmol/L (ref 0–100)

## 2013-10-14 MED ORDER — SILVER SULFADIAZINE 1 % EX CREA: TOPICAL_CREAM | Freq: Every day | CUTANEOUS | Status: DC

## 2013-10-14 MED ORDER — CLINDAMYCIN HCL 300 MG PO CAPS: 300.0000 mg | ORAL_CAPSULE | Freq: Three times a day (TID) | ORAL | Status: DC

## 2013-10-14 MED ORDER — HYDROCODONE-ACETAMINOPHEN 5-325 MG PO TABS: 1.0000 | ORAL_TABLET | ORAL | Status: DC | PRN

## 2013-10-14 MED ORDER — SILVER SULFADIAZINE 1 % EX CREA
TOPICAL_CREAM | Freq: Once | CUTANEOUS | Status: AC
  Administered 2013-10-14: 16:00:00 via TOPICAL
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
## 2013-10-14 NOTE — ED Notes (Signed)
Extensive wound care performed by tim, emt.

## 2013-10-14 NOTE — ED Notes (Signed)
Placed urine in lab 
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
## 2013-10-14 NOTE — ED Notes (Signed)
Patient"s wounds required extensive dressings and patient required extensive teaching
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
## 2013-10-16 NOTE — ED Provider Notes (Signed)
Medical screening examination/treatment/procedure(s) were performed by a resident physician or non-physician practitioner and as the supervising physician I was immediately available for consultation/collaboration.  Ferdinando Lodge, MD    Janett Kamath S Javad Salva, MD 10/16/13 0952 

## 2014-01-16 ENCOUNTER — Ambulatory Visit: Payer: Self-pay | Admitting: Anesthesiology

## 2014-02-17 ENCOUNTER — Emergency Department: Payer: Self-pay | Admitting: Emergency Medicine

## 2014-02-20 ENCOUNTER — Other Ambulatory Visit: Payer: Self-pay | Admitting: Orthopedic Surgery

## 2014-02-20 ENCOUNTER — Ambulatory Visit
Admission: RE | Admit: 2014-02-20 | Discharge: 2014-02-20 | Disposition: A | Discharge status: Home / Self Care | Payer: Medicare Other | Source: Ambulatory Visit | Attending: Orthopedic Surgery | Admitting: Orthopedic Surgery

## 2014-02-20 DIAGNOSIS — S42209A Unspecified fracture of upper end of unspecified humerus, initial encounter for closed fracture: Secondary | ICD-10-CM

## 2014-02-21 ENCOUNTER — Encounter (HOSPITAL_COMMUNITY): Payer: Self-pay | Admitting: Pharmacist

## 2014-02-21 ENCOUNTER — Other Ambulatory Visit: Payer: Self-pay | Admitting: Orthopedic Surgery

## 2014-02-22 ENCOUNTER — Encounter (HOSPITAL_COMMUNITY): Payer: Self-pay | Admitting: *Deleted

## 2014-02-22 MED ORDER — CEFAZOLIN SODIUM-DEXTROSE 2-3 GM-% IV SOLR
2.0000 g | INTRAVENOUS | Status: AC
  Administered 2014-02-23: 2 g via INTRAVENOUS
  Filled 2014-02-22: qty 50

## 2014-02-22 NOTE — Progress Notes (Signed)
Pt denies SOB and chest pain but is currently under the care of Dr. Chancy Milroy ( cardiology). Pt had stents placed in both 2007 and 2008. Pt stated that she had an echo and stress test in 2012 at [facility]. According to pt, she took her last dose of Plavix on Tuesday, 02/20/14 without the surgeons knowledge.

## 2014-02-22 NOTE — Progress Notes (Signed)
Spoke with Judeen Hammans ( Nurse) at Dr. Luanna Cole office to make MD aware that pt stopped her Plavix ( last dose Tuesday, 2/ 24/14). According to Judeen Hammans, "pt has not seen her cardiologist in 3 years, pt PCP was refilling her Plavix prescriptions, and pt does not have cardiac clearance for procedure." Spoke with Dr. Marcie Bal ( anesthesia) regarding pt discontinuing Plavix without MD consent, not seeing her cardiologist and not having cardiac clearance with a history of stents placed in 2007 and 2008 ( per pt). A request was made from [facility] for results of stress test and echo done in 2012 in addition to latest office notes.

## 2014-02-23 ENCOUNTER — Observation Stay (HOSPITAL_COMMUNITY): Payer: Medicare Other

## 2014-02-23 ENCOUNTER — Ambulatory Visit (HOSPITAL_COMMUNITY): Payer: Medicare Other | Admitting: Anesthesiology

## 2014-02-23 ENCOUNTER — Observation Stay (HOSPITAL_COMMUNITY)
Admission: RE | Admit: 2014-02-23 | Discharge: 2014-02-24 | Disposition: A | Discharge status: Home / Self Care | Payer: Medicare Other | Source: Ambulatory Visit | Attending: Orthopedic Surgery | Admitting: Orthopedic Surgery

## 2014-02-23 ENCOUNTER — Encounter (HOSPITAL_COMMUNITY): Payer: Medicare Other | Admitting: Anesthesiology

## 2014-02-23 ENCOUNTER — Encounter (HOSPITAL_COMMUNITY): Payer: Self-pay

## 2014-02-23 ENCOUNTER — Encounter (HOSPITAL_COMMUNITY)
Admission: RE | Disposition: A | Discharge status: Home / Self Care | Payer: Self-pay | Source: Ambulatory Visit | Attending: Orthopedic Surgery

## 2014-02-23 DIAGNOSIS — S42209A Unspecified fracture of upper end of unspecified humerus, initial encounter for closed fracture: Principal | ICD-10-CM | POA: Diagnosis present

## 2014-02-23 DIAGNOSIS — E119 Type 2 diabetes mellitus without complications: Secondary | ICD-10-CM | POA: Insufficient documentation

## 2014-02-23 DIAGNOSIS — S42202A Unspecified fracture of upper end of left humerus, initial encounter for closed fracture: Secondary | ICD-10-CM | POA: Diagnosis present

## 2014-02-23 DIAGNOSIS — Z888 Allergy status to other drugs, medicaments and biological substances status: Secondary | ICD-10-CM | POA: Insufficient documentation

## 2014-02-23 DIAGNOSIS — Z9889 Other specified postprocedural states: Secondary | ICD-10-CM | POA: Insufficient documentation

## 2014-02-23 DIAGNOSIS — I2510 Atherosclerotic heart disease of native coronary artery without angina pectoris: Secondary | ICD-10-CM | POA: Insufficient documentation

## 2014-02-23 DIAGNOSIS — X58XXXA Exposure to other specified factors, initial encounter: Secondary | ICD-10-CM | POA: Insufficient documentation

## 2014-02-23 DIAGNOSIS — Z9089 Acquired absence of other organs: Secondary | ICD-10-CM | POA: Insufficient documentation

## 2014-02-23 DIAGNOSIS — I252 Old myocardial infarction: Secondary | ICD-10-CM | POA: Diagnosis not present

## 2014-02-23 HISTORY — DX: Unspecified fracture of upper end of left humerus, initial encounter for closed fracture: S42.202A

## 2014-02-23 HISTORY — DX: Other complications of anesthesia, initial encounter: T88.59XA

## 2014-02-23 HISTORY — DX: Other specified postprocedural states: Z98.890

## 2014-02-23 HISTORY — DX: Other specified postprocedural states: R11.2

## 2014-02-23 HISTORY — DX: Panic disorder (episodic paroxysmal anxiety): F41.0

## 2014-02-23 HISTORY — PX: ORIF HUMERUS FRACTURE: SHX2126

## 2014-02-23 HISTORY — DX: Adverse effect of unspecified anesthetic, initial encounter: T41.45XA

## 2014-02-23 LAB — CBC
HEMATOCRIT: 32 % — AB (ref 36.0–46.0)
Hemoglobin: 10.5 g/dL — ABNORMAL LOW (ref 12.0–15.0)
MCH: 28.5 pg (ref 26.0–34.0)
MCHC: 32.8 g/dL (ref 30.0–36.0)
MCV: 87 fL (ref 78.0–100.0)
Platelets: 214 10*3/uL (ref 150–400)
RBC: 3.68 MIL/uL — AB (ref 3.87–5.11)
RDW: 16.4 % — ABNORMAL HIGH (ref 11.5–15.5)
WBC: 8.8 10*3/uL (ref 4.0–10.5)

## 2014-02-23 LAB — GLUCOSE, CAPILLARY
GLUCOSE-CAPILLARY: 145 mg/dL — AB (ref 70–99)
GLUCOSE-CAPILLARY: 89 mg/dL (ref 70–99)
Glucose-Capillary: 108 mg/dL — ABNORMAL HIGH (ref 70–99)
Glucose-Capillary: 196 mg/dL — ABNORMAL HIGH (ref 70–99)

## 2014-02-23 LAB — BASIC METABOLIC PANEL
BUN: 16 mg/dL (ref 6–23)
CALCIUM: 8.9 mg/dL (ref 8.4–10.5)
CO2: 24 meq/L (ref 19–32)
Chloride: 102 mEq/L (ref 96–112)
Creatinine, Ser: 0.98 mg/dL (ref 0.50–1.10)
GFR calc Af Amer: 69 mL/min — ABNORMAL LOW (ref >90)
GFR calc non Af Amer: 60 mL/min — ABNORMAL LOW (ref >90)
GLUCOSE: 121 mg/dL — AB (ref 70–99)
Potassium: 3.8 mEq/L (ref 3.7–5.3)
SODIUM: 139 meq/L (ref 137–147)

## 2014-02-23 SURGERY — OPEN REDUCTION INTERNAL FIXATION (ORIF) PROXIMAL HUMERUS FRACTURE
Anesthesia: General | Site: Arm Upper | Laterality: Left

## 2014-02-23 MED ORDER — PHENYLEPHRINE HCL 10 MG/ML IJ SOLN
INTRAMUSCULAR | Status: DC | PRN
  Administered 2014-02-23: 120 ug via INTRAVENOUS

## 2014-02-23 MED ORDER — CALCIUM CARBONATE 1250 (500 CA) MG PO TABS
1.0000 | ORAL_TABLET | Freq: Every day | ORAL | Status: DC
  Filled 2014-02-23 (×2): qty 1

## 2014-02-23 MED ORDER — LIDOCAINE HCL (CARDIAC) 20 MG/ML IV SOLN
INTRAVENOUS | Status: AC
  Filled 2014-02-23: qty 5

## 2014-02-23 MED ORDER — MIDAZOLAM HCL 5 MG/5ML IJ SOLN
INTRAMUSCULAR | Status: DC | PRN
  Administered 2014-02-23 (×2): 1 mg via INTRAVENOUS

## 2014-02-23 MED ORDER — ONDANSETRON HCL 4 MG/2ML IJ SOLN
INTRAMUSCULAR | Status: DC | PRN
  Administered 2014-02-23: 4 mg via INTRAVENOUS

## 2014-02-23 MED ORDER — MIDAZOLAM HCL 2 MG/2ML IJ SOLN
INTRAMUSCULAR | Status: AC
  Filled 2014-02-23: qty 2

## 2014-02-23 MED ORDER — POTASSIUM CHLORIDE IN NACL 20-0.45 MEQ/L-% IV SOLN
INTRAVENOUS | Status: DC
  Administered 2014-02-23: 23:00:00 via INTRAVENOUS
  Filled 2014-02-23 (×4): qty 1000

## 2014-02-23 MED ORDER — METOCLOPRAMIDE HCL 10 MG PO TABS: 5.0000 mg | ORAL_TABLET | Freq: Three times a day (TID) | ORAL | Status: DC | PRN

## 2014-02-23 MED ORDER — STERILE WATER FOR INJECTION IJ SOLN
INTRAMUSCULAR | Status: AC
  Filled 2014-02-23: qty 10

## 2014-02-23 MED ORDER — CALCIUM CARBONATE 600 MG PO TABS: 600.0000 mg | ORAL_TABLET | Freq: Every day | ORAL | Status: DC

## 2014-02-23 MED ORDER — MENTHOL 3 MG MT LOZG: 1.0000 | LOZENGE | OROMUCOSAL | Status: DC | PRN

## 2014-02-23 MED ORDER — PROPOFOL 10 MG/ML IV BOLUS
INTRAVENOUS | Status: AC
  Filled 2014-02-23: qty 20

## 2014-02-23 MED ORDER — DIPHENHYDRAMINE HCL 12.5 MG/5ML PO ELIX: 12.5000 mg | ORAL_SOLUTION | ORAL | Status: DC | PRN

## 2014-02-23 MED ORDER — PHENOL 1.4 % MT LIQD: 1.0000 | OROMUCOSAL | Status: DC | PRN

## 2014-02-23 MED ORDER — FENTANYL CITRATE 0.05 MG/ML IJ SOLN
INTRAMUSCULAR | Status: DC | PRN
  Administered 2014-02-23: 50 ug via INTRAVENOUS

## 2014-02-23 MED ORDER — GABAPENTIN 600 MG PO TABS
600.0000 mg | ORAL_TABLET | ORAL | Status: AC
  Administered 2014-02-23: 600 mg via ORAL
  Filled 2014-02-23: qty 1

## 2014-02-23 MED ORDER — ACETAMINOPHEN 325 MG PO TABS
650.0000 mg | ORAL_TABLET | Freq: Four times a day (QID) | ORAL | Status: DC | PRN
  Administered 2014-02-23 – 2014-02-24 (×2): 650 mg via ORAL
  Filled 2014-02-23 (×2): qty 2

## 2014-02-23 MED ORDER — ATORVASTATIN CALCIUM 40 MG PO TABS
40.0000 mg | ORAL_TABLET | Freq: Every day | ORAL | Status: DC
  Administered 2014-02-23: 40 mg via ORAL
  Filled 2014-02-23 (×2): qty 1

## 2014-02-23 MED ORDER — BISACODYL 10 MG RE SUPP: 10.0000 mg | Freq: Every day | RECTAL | Status: DC | PRN

## 2014-02-23 MED ORDER — ROCURONIUM BROMIDE 50 MG/5ML IV SOLN
INTRAVENOUS | Status: AC
  Filled 2014-02-23: qty 1

## 2014-02-23 MED ORDER — CYCLOBENZAPRINE HCL 10 MG PO TABS
10.0000 mg | ORAL_TABLET | Freq: Every day | ORAL | Status: DC
  Administered 2014-02-23: 10 mg via ORAL
  Filled 2014-02-23 (×2): qty 1

## 2014-02-23 MED ORDER — OXYCODONE HCL 5 MG PO TABS: 5.0000 mg | ORAL_TABLET | ORAL | Status: DC | PRN

## 2014-02-23 MED ORDER — DOCUSATE SODIUM 100 MG PO CAPS
100.0000 mg | ORAL_CAPSULE | Freq: Two times a day (BID) | ORAL | Status: DC
  Administered 2014-02-23 – 2014-02-24 (×2): 100 mg via ORAL
  Filled 2014-02-23 (×2): qty 1

## 2014-02-23 MED ORDER — ONDANSETRON HCL 4 MG/2ML IJ SOLN: 4.0000 mg | Freq: Four times a day (QID) | INTRAMUSCULAR | Status: DC | PRN

## 2014-02-23 MED ORDER — OXYCODONE HCL 5 MG/5ML PO SOLN: 5.0000 mg | Freq: Once | ORAL | Status: DC | PRN

## 2014-02-23 MED ORDER — LACTATED RINGERS IV SOLN
INTRAVENOUS | Status: DC | PRN
  Administered 2014-02-23: 07:00:00 via INTRAVENOUS

## 2014-02-23 MED ORDER — CLOPIDOGREL BISULFATE 75 MG PO TABS
75.0000 mg | ORAL_TABLET | Freq: Every day | ORAL | Status: DC
  Administered 2014-02-24: 75 mg via ORAL
  Filled 2014-02-23 (×2): qty 1

## 2014-02-23 MED ORDER — BUPIVACAINE-EPINEPHRINE (PF) 0.25% -1:200000 IJ SOLN
INTRAMUSCULAR | Status: AC
  Filled 2014-02-23: qty 30

## 2014-02-23 MED ORDER — INSULIN ASPART 100 UNIT/ML ~~LOC~~ SOLN
30.0000 [IU] | Freq: Three times a day (TID) | SUBCUTANEOUS | Status: DC
  Administered 2014-02-23: 30 [IU] via SUBCUTANEOUS
  Administered 2014-02-24: 45 [IU] via SUBCUTANEOUS

## 2014-02-23 MED ORDER — HYDROMORPHONE HCL PF 1 MG/ML IJ SOLN
0.5000 mg | INTRAMUSCULAR | Status: DC | PRN
  Administered 2014-02-23: 0.5 mg via INTRAVENOUS
  Filled 2014-02-23: qty 1

## 2014-02-23 MED ORDER — SENNA-DOCUSATE SODIUM 8.6-50 MG PO TABS: 2.0000 | ORAL_TABLET | Freq: Every day | ORAL | Status: DC

## 2014-02-23 MED ORDER — PHENYLEPHRINE HCL 10 MG/ML IJ SOLN
10.0000 mg | INTRAVENOUS | Status: DC | PRN
  Administered 2014-02-23: 15 ug/min via INTRAVENOUS

## 2014-02-23 MED ORDER — TRAMADOL HCL 50 MG PO TABS
100.0000 mg | ORAL_TABLET | Freq: Three times a day (TID) | ORAL | Status: DC
  Administered 2014-02-23 – 2014-02-24 (×3): 100 mg via ORAL
  Filled 2014-02-23 (×3): qty 2

## 2014-02-23 MED ORDER — TRAMADOL HCL 50 MG PO TABS
100.0000 mg | ORAL_TABLET | ORAL | Status: AC
  Administered 2014-02-23: 100 mg via ORAL
  Filled 2014-02-23: qty 2

## 2014-02-23 MED ORDER — BUPIVACAINE-EPINEPHRINE PF 0.5-1:200000 % IJ SOLN
INTRAMUSCULAR | Status: DC | PRN
  Administered 2014-02-23: 30 mL via PERINEURAL

## 2014-02-23 MED ORDER — SUCCINYLCHOLINE CHLORIDE 20 MG/ML IJ SOLN
INTRAMUSCULAR | Status: AC
  Filled 2014-02-23: qty 1

## 2014-02-23 MED ORDER — METOCLOPRAMIDE HCL 5 MG/ML IJ SOLN: 5.0000 mg | Freq: Three times a day (TID) | INTRAMUSCULAR | Status: DC | PRN

## 2014-02-23 MED ORDER — OXYCODONE-ACETAMINOPHEN 5-325 MG PO TABS
1.0000 | ORAL_TABLET | ORAL | Status: DC | PRN
  Administered 2014-02-23 – 2014-02-24 (×4): 2 via ORAL
  Filled 2014-02-23 (×4): qty 2

## 2014-02-23 MED ORDER — ALBUMIN HUMAN 5 % IV SOLN
INTRAVENOUS | Status: DC | PRN
  Administered 2014-02-23: 10:00:00 via INTRAVENOUS

## 2014-02-23 MED ORDER — ONDANSETRON HCL 4 MG PO TABS: 4.0000 mg | ORAL_TABLET | Freq: Four times a day (QID) | ORAL | Status: DC | PRN

## 2014-02-23 MED ORDER — NEOSTIGMINE METHYLSULFATE 1 MG/ML IJ SOLN
INTRAMUSCULAR | Status: AC
  Filled 2014-02-23: qty 10

## 2014-02-23 MED ORDER — ARTIFICIAL TEARS OP OINT
TOPICAL_OINTMENT | OPHTHALMIC | Status: AC
  Filled 2014-02-23: qty 3.5

## 2014-02-23 MED ORDER — METHOCARBAMOL 500 MG PO TABS: 500.0000 mg | ORAL_TABLET | Freq: Four times a day (QID) | ORAL | Status: DC | PRN

## 2014-02-23 MED ORDER — METOCLOPRAMIDE HCL 10 MG PO TABS
10.0000 mg | ORAL_TABLET | Freq: Every day | ORAL | Status: DC
  Administered 2014-02-23: 10 mg via ORAL
  Filled 2014-02-23 (×2): qty 1

## 2014-02-23 MED ORDER — OXYCODONE-ACETAMINOPHEN 10-325 MG PO TABS: 1.0000 | ORAL_TABLET | Freq: Four times a day (QID) | ORAL | Status: DC | PRN

## 2014-02-23 MED ORDER — ONDANSETRON HCL 4 MG PO TABS: 4.0000 mg | ORAL_TABLET | Freq: Three times a day (TID) | ORAL | Status: DC | PRN

## 2014-02-23 MED ORDER — 0.9 % SODIUM CHLORIDE (POUR BTL) OPTIME
TOPICAL | Status: DC | PRN
  Administered 2014-02-23: 1000 mL

## 2014-02-23 MED ORDER — EPHEDRINE SULFATE 50 MG/ML IJ SOLN
INTRAMUSCULAR | Status: AC
  Filled 2014-02-23: qty 1

## 2014-02-23 MED ORDER — OXYCODONE HCL 5 MG PO TABS: 5.0000 mg | ORAL_TABLET | Freq: Once | ORAL | Status: DC | PRN

## 2014-02-23 MED ORDER — ONDANSETRON HCL 4 MG/2ML IJ SOLN
INTRAMUSCULAR | Status: AC
  Filled 2014-02-23: qty 2

## 2014-02-23 MED ORDER — POLYETHYLENE GLYCOL 3350 17 G PO PACK: 17.0000 g | PACK | Freq: Every day | ORAL | Status: DC | PRN

## 2014-02-23 MED ORDER — GABAPENTIN 600 MG PO TABS
600.0000 mg | ORAL_TABLET | Freq: Three times a day (TID) | ORAL | Status: DC
  Administered 2014-02-23 – 2014-02-24 (×2): 600 mg via ORAL
  Filled 2014-02-23 (×4): qty 1

## 2014-02-23 MED ORDER — FENTANYL CITRATE 0.05 MG/ML IJ SOLN
INTRAMUSCULAR | Status: AC
  Filled 2014-02-23: qty 5

## 2014-02-23 MED ORDER — CEFAZOLIN SODIUM-DEXTROSE 2-3 GM-% IV SOLR
2.0000 g | Freq: Four times a day (QID) | INTRAVENOUS | Status: AC
  Administered 2014-02-23 – 2014-02-24 (×3): 2 g via INTRAVENOUS
  Filled 2014-02-23 (×6): qty 50

## 2014-02-23 MED ORDER — GLYCOPYRROLATE 0.2 MG/ML IJ SOLN
INTRAMUSCULAR | Status: AC
Start: 2014-02-23 — End: 2014-02-23
  Filled 2014-02-23: qty 3

## 2014-02-23 MED ORDER — ALUM & MAG HYDROXIDE-SIMETH 200-200-20 MG/5ML PO SUSP: 30.0000 mL | ORAL | Status: DC | PRN

## 2014-02-23 MED ORDER — ARTIFICIAL TEARS OP OINT
TOPICAL_OINTMENT | OPHTHALMIC | Status: DC | PRN
  Administered 2014-02-23: 1 via OPHTHALMIC

## 2014-02-23 MED ORDER — ROCURONIUM BROMIDE 100 MG/10ML IV SOLN
INTRAVENOUS | Status: DC | PRN
  Administered 2014-02-23 (×2): 50 mg via INTRAVENOUS

## 2014-02-23 MED ORDER — METHOCARBAMOL 100 MG/ML IJ SOLN: 500.0000 mg | Freq: Four times a day (QID) | INTRAVENOUS | Status: DC | PRN

## 2014-02-23 MED ORDER — NEOSTIGMINE METHYLSULFATE 1 MG/ML IJ SOLN
INTRAMUSCULAR | Status: DC | PRN
  Administered 2014-02-23: 4 mg via INTRAVENOUS

## 2014-02-23 MED ORDER — GLYCOPYRROLATE 0.2 MG/ML IJ SOLN
INTRAMUSCULAR | Status: DC | PRN
  Administered 2014-02-23 (×2): 0.2 mg via INTRAVENOUS
  Administered 2014-02-23: 0.6 mg via INTRAVENOUS

## 2014-02-23 MED ORDER — LIDOCAINE HCL (CARDIAC) 20 MG/ML IV SOLN
INTRAVENOUS | Status: DC | PRN
  Administered 2014-02-23: 80 mg via INTRAVENOUS

## 2014-02-23 MED ORDER — ACETAMINOPHEN 650 MG RE SUPP: 650.0000 mg | Freq: Four times a day (QID) | RECTAL | Status: DC | PRN

## 2014-02-23 MED ORDER — INSULIN NPH (HUMAN) (ISOPHANE) 100 UNIT/ML ~~LOC~~ SUSP
60.0000 [IU] | Freq: Two times a day (BID) | SUBCUTANEOUS | Status: DC
  Administered 2014-02-23 – 2014-02-24 (×2): 60 [IU] via SUBCUTANEOUS
  Filled 2014-02-23 (×2): qty 10

## 2014-02-23 MED ORDER — FENTANYL CITRATE 0.05 MG/ML IJ SOLN: 25.0000 ug | INTRAMUSCULAR | Status: DC | PRN

## 2014-02-23 MED ORDER — CYCLOBENZAPRINE HCL 10 MG PO TABS: 10.0000 mg | ORAL_TABLET | Freq: Every day | ORAL | Status: DC

## 2014-02-23 MED ORDER — GLYCOPYRROLATE 0.2 MG/ML IJ SOLN
INTRAMUSCULAR | Status: AC
  Filled 2014-02-23: qty 2

## 2014-02-23 MED ORDER — PROPOFOL 10 MG/ML IV BOLUS
INTRAVENOUS | Status: DC | PRN
  Administered 2014-02-23: 120 mg via INTRAVENOUS

## 2014-02-23 MED ORDER — SENNA 8.6 MG PO TABS
1.0000 | ORAL_TABLET | Freq: Two times a day (BID) | ORAL | Status: DC
  Filled 2014-02-23 (×3): qty 1

## 2014-02-23 SURGICAL SUPPLY — 82 items
BENZOIN TINCTURE PRP APPL 2/3 (GAUZE/BANDAGES/DRESSINGS) ×3 IMPLANT
BIT DRILL 4 LONG FAST STEP (BIT) ×3 IMPLANT
BIT DRILL 4 SHORT FAST STEP (BIT) ×3 IMPLANT
CLEANER TIP ELECTROSURG 2X2 (MISCELLANEOUS) ×3 IMPLANT
CLOSURE STERI-STRIP 1/2X4 (GAUZE/BANDAGES/DRESSINGS) ×1
CLOSURE WOUND 1/2 X4 (GAUZE/BANDAGES/DRESSINGS) ×1
CLOTH BEACON ORANGE TIMEOUT ST (SAFETY) IMPLANT
CLSR STERI-STRIP ANTIMIC 1/2X4 (GAUZE/BANDAGES/DRESSINGS) ×2 IMPLANT
COVER SURGICAL LIGHT HANDLE (MISCELLANEOUS) ×3 IMPLANT
DRAPE C-ARM 42X72 X-RAY (DRAPES) ×3 IMPLANT
DRAPE INCISE IOBAN 66X45 STRL (DRAPES) ×3 IMPLANT
DRAPE SURG 17X23 STRL (DRAPES) ×3 IMPLANT
DRAPE U-SHAPE 47X51 STRL (DRAPES) ×3 IMPLANT
DRILL BIT 2.8MM DB28 (MISCELLANEOUS) ×3 IMPLANT
DRSG MEPILEX BORDER 4X8 (GAUZE/BANDAGES/DRESSINGS) ×3 IMPLANT
DRSG PAD ABDOMINAL 8X10 ST (GAUZE/BANDAGES/DRESSINGS) IMPLANT
DURAPREP 26ML APPLICATOR (WOUND CARE) ×3 IMPLANT
ELECT REM PT RETURN 9FT ADLT (ELECTROSURGICAL) ×3
ELECTRODE REM PT RTRN 9FT ADLT (ELECTROSURGICAL) ×1 IMPLANT
GAUZE XEROFORM 1X8 LF (GAUZE/BANDAGES/DRESSINGS) IMPLANT
GLOVE BIO SURGEON STRL SZ 6.5 (GLOVE) ×4 IMPLANT
GLOVE BIO SURGEONS STRL SZ 6.5 (GLOVE) ×2
GLOVE BIOGEL PI IND STRL 6.5 (GLOVE) ×2 IMPLANT
GLOVE BIOGEL PI IND STRL 7.0 (GLOVE) ×1 IMPLANT
GLOVE BIOGEL PI INDICATOR 6.5 (GLOVE) ×4
GLOVE BIOGEL PI INDICATOR 7.0 (GLOVE) ×2
GLOVE BIOGEL PI ORTHO PRO SZ8 (GLOVE) ×4
GLOVE ECLIPSE 6.5 STRL STRAW (GLOVE) ×3 IMPLANT
GLOVE ORTHO TXT STRL SZ7.5 (GLOVE) ×9 IMPLANT
GLOVE PI ORTHO PRO STRL SZ8 (GLOVE) ×2 IMPLANT
GLOVE SURG ORTHO 8.0 STRL STRW (GLOVE) ×6 IMPLANT
GLOVE SURG SS PI 6.5 STRL IVOR (GLOVE) ×3 IMPLANT
GOWN STRL NON-REIN LRG LVL3 (GOWN DISPOSABLE) IMPLANT
GOWN STRL REUS W/ TWL LRG LVL3 (GOWN DISPOSABLE) ×3 IMPLANT
GOWN STRL REUS W/TWL 2XL LVL3 (GOWN DISPOSABLE) ×3 IMPLANT
GOWN STRL REUS W/TWL LRG LVL3 (GOWN DISPOSABLE) ×6
KIT BASIN OR (CUSTOM PROCEDURE TRAY) ×3 IMPLANT
KIT ROOM TURNOVER OR (KITS) ×3 IMPLANT
MANIFOLD NEPTUNE II (INSTRUMENTS) ×3 IMPLANT
NEEDLE HYPO 25GX1X1/2 BEV (NEEDLE) ×3 IMPLANT
NS IRRIG 1000ML POUR BTL (IV SOLUTION) ×3 IMPLANT
PACK SHOULDER (CUSTOM PROCEDURE TRAY) ×3 IMPLANT
PAD ARMBOARD 7.5X6 YLW CONV (MISCELLANEOUS) ×6 IMPLANT
PEG STND 4.0X30MM (Orthopedic Implant) ×3 IMPLANT
PEG STND 4.0X32.5MM (Orthopedic Implant) ×3 IMPLANT
PEG STND 4.0X35MM (Orthopedic Implant) ×3 IMPLANT
PEG STND 4.0X37.5MM (Orthopedic Implant) ×3 IMPLANT
PEG STND 4.0X40MM (Orthopedic Implant) ×6 IMPLANT
PEG STND 4.0X50.0MM (Orthopedic Implant) ×3 IMPLANT
PEGSTD 4.0X30MM (Orthopedic Implant) ×1 IMPLANT
PEGSTD 4.0X32.5MM (Orthopedic Implant) ×1 IMPLANT
PEGSTD 4.0X35MM (Orthopedic Implant) ×1 IMPLANT
PEGSTD 4.0X37.5MM (Orthopedic Implant) ×1 IMPLANT
PEGSTD 4.0X40MM (Orthopedic Implant) ×2 IMPLANT
PEGSTD 4.0X50.0MM (Orthopedic Implant) ×1 IMPLANT
PIN GUIDE SHOULDER 2.0MM (PIN) ×6 IMPLANT
PLATE SHOULDER S3 4HOLE LT (Plate) ×3 IMPLANT
SCREW LOCK 90D ANGLED 3.8X28 (Screw) ×6 IMPLANT
SCREW MULTIDIR SS 3.8X28 HUMRL (Screw) ×3 IMPLANT
SLING ARM IMMOBILIZER XL (CAST SUPPLIES) ×3 IMPLANT
SPONGE GAUZE 4X4 12PLY (GAUZE/BANDAGES/DRESSINGS) IMPLANT
SPONGE LAP 18X18 X RAY DECT (DISPOSABLE) ×3 IMPLANT
SPONGE LAP 4X18 X RAY DECT (DISPOSABLE) IMPLANT
STAPLER VISISTAT 35W (STAPLE) IMPLANT
STOCKINETTE 6  STRL (DRAPES) ×2
STOCKINETTE 6 STRL (DRAPES) ×1 IMPLANT
STRIP CLOSURE SKIN 1/2X4 (GAUZE/BANDAGES/DRESSINGS) ×2 IMPLANT
SUCTION FRAZIER TIP 10 FR DISP (SUCTIONS) ×3 IMPLANT
SUPPORT WRAP ARM LG (MISCELLANEOUS) ×3 IMPLANT
SUT FIBERWIRE #2 38 T-5 BLUE (SUTURE) ×3
SUT MNCRL AB 4-0 PS2 18 (SUTURE) ×3 IMPLANT
SUT VIC AB 0 CTB1 27 (SUTURE) ×3 IMPLANT
SUT VIC AB 2-0 CT1 27 (SUTURE) ×2
SUT VIC AB 2-0 CT1 36 (SUTURE) ×3 IMPLANT
SUT VIC AB 2-0 CT1 TAPERPNT 27 (SUTURE) ×1 IMPLANT
SUT VIC AB 3-0 FS2 27 (SUTURE) ×3 IMPLANT
SUTURE FIBERWR #2 38 T-5 BLUE (SUTURE) ×1 IMPLANT
SYR BULB IRRIGATION 50ML (SYRINGE) ×3 IMPLANT
SYR CONTROL 10ML LL (SYRINGE) ×3 IMPLANT
TOWEL OR 17X24 6PK STRL BLUE (TOWEL DISPOSABLE) ×3 IMPLANT
TOWEL OR 17X26 10 PK STRL BLUE (TOWEL DISPOSABLE) ×3 IMPLANT
WATER STERILE IRR 1000ML POUR (IV SOLUTION) IMPLANT

## 2014-02-23 NOTE — Anesthesia Procedure Notes (Addendum)
Anesthesia Regional Block:  Interscalene brachial plexus block  Pre-Anesthetic Checklist: ,, timeout performed, Correct Patient, Correct Site, Correct Laterality, Correct Procedure, Correct Position, site marked, Risks and benefits discussed,  Surgical consent,  Pre-op evaluation,  At surgeon's request and post-op pain management  Laterality: Left  Prep: chloraprep       Needles:  Injection technique: Single-shot  Needle Type: Echogenic Stimulator Needle     Needle Length: 5cm 5 cm Needle Gauge: 22 and 22 G    Additional Needles:  Procedures: ultrasound guided (picture in chart) and nerve stimulator Interscalene brachial plexus block  Nerve Stimulator or Paresthesia:  Response: biceps flexion, 0.45 mA,   Additional Responses:   Narrative:  Start time: 02/23/2014 7:16 AM End time: 02/23/2014 7:28 AM Injection made incrementally with aspirations every 5 mL.  Performed by: Personally  Anesthesiologist: Dr Marcie Bal  Additional Notes: Functioning IV was confirmed and monitors were applied.  A 3mm 22ga Arrow echogenic stimulator needle was used. Sterile prep and drape,hand hygiene and sterile gloves were used.  Negative aspiration and negative test dose prior to incremental administration of local anesthetic. The patient tolerated the procedure well.  Ultrasound guidance: relevent anatomy identified, needle position confirmed, local anesthetic spread visualized around nerve(s), vascular puncture avoided.  Image printed for medical record.    Procedure Name: Intubation Date/Time: 02/23/2014 7:47 AM Performed by: Erik Obey Pre-anesthesia Checklist: Patient identified, Patient being monitored, Emergency Drugs available, Timeout performed and Suction available Patient Re-evaluated:Patient Re-evaluated prior to inductionOxygen Delivery Method: Circle system utilized Preoxygenation: Pre-oxygenation with 100% oxygen Intubation Type: IV induction Ventilation: Mask ventilation  without difficulty Laryngoscope Size: Mac and 3 Grade View: Grade I Tube type: Oral Tube size: 7.5 mm Number of attempts: 1 Airway Equipment and Method: Stylet Placement Confirmation: ETT inserted through vocal cords under direct vision,  positive ETCO2 and breath sounds checked- equal and bilateral Secured at: 21 cm Tube secured with: Tape Dental Injury: Teeth and Oropharynx as per pre-operative assessment

## 2014-02-23 NOTE — Discharge Instructions (Signed)
Diet: As you were doing prior to hospitalization  ° °Shower:  May shower but keep the wounds dry, use an occlusive plastic wrap, NO SOAKING IN TUB.  If the bandage gets wet, change with a clean dry gauze. ° °Dressing:  You may change your dressing 3-5 days after surgery.  Then change the dressing daily with sterile gauze dressing.   ° °There are sticky tapes (steri-strips) on your wounds and all the stitches are absorbable.  Leave the steri-strips in place when changing your dressings, they will peel off with time, usually 2-3 weeks. ° °Activity:  Increase activity slowly as tolerated, but follow the weight bearing instructions below.  No lifting or driving for 6 weeks. ° °Weight Bearing:   Sling at all times..   ° °To prevent constipation: you may use a stool softener such as - ° °Colace (over the counter) 100 mg by mouth twice a day  °Drink plenty of fluids (prune juice may be helpful) and high fiber foods °Miralax (over the counter) for constipation as needed.   ° °Itching:  If you experience itching with your medications, try taking only a single pain pill, or even half a pain pill at a time.  You may take up to 10 pain pills per day, and you can also use benadryl over the counter for itching or also to help with sleep.  ° °Precautions:  If you experience chest pain or shortness of breath - call 911 immediately for transfer to the hospital emergency department!! ° °If you develop a fever greater that 101 F, purulent drainage from wound, increased redness or drainage from wound, or calf pain -- Call the office at [phone]                                                °Follow- Up Appointment:  Please call for an appointment to be seen in 2 weeks [place] - [phone] ° ° ° ° ° °

## 2014-02-23 NOTE — Anesthesia Preprocedure Evaluation (Signed)
Anesthesia Evaluation  Patient identified by MRN, date of birth, ID band Patient awake    Reviewed: Allergy & Precautions, H&P , NPO status , Patient's Chart, lab work & pertinent test results  History of Anesthesia Complications (+) PONV  Airway Mallampati: II  Neck ROM: full    Dental   Pulmonary          Cardiovascular + CAD, + Past MI and + Cardiac Stents  Stress test in 2012 was OK per patient.  Proceeded with breast surgery after that without problem.  She denies any current cardiac symptoms.  No SOB or CP.  Her most recent dose of plavix was 02/20/14.  Her coronary stents have been in place since 2008.   Neuro/Psych Anxiety Severe lower extremity neuropathy    GI/Hepatic   Endo/Other  diabetes, Type 2  Renal/GU      Musculoskeletal   Abdominal   Peds  Hematology   Anesthesia Other Findings   Reproductive/Obstetrics                           Anesthesia Physical Anesthesia Plan  ASA: III  Anesthesia Plan: General and Regional   Post-op Pain Management: MAC Combined w/ Regional for Post-op pain   Induction: Intravenous  Airway Management Planned: Oral ETT  Additional Equipment:   Intra-op Plan:   Post-operative Plan: Extubation in OR  Informed Consent: I have reviewed the patients History and Physical, chart, labs and discussed the procedure including the risks, benefits and alternatives for the proposed anesthesia with the patient or authorized representative who has indicated his/her understanding and acceptance.     Plan Discussed with: CRNA, Anesthesiologist and Surgeon  Anesthesia Plan Comments:         Anesthesia Quick Evaluation

## 2014-02-23 NOTE — Op Note (Signed)
02/23/2014  9:55 AM  PATIENT:  [name]    PRE-OPERATIVE DIAGNOSIS:  left proximal humerus fracture  POST-OPERATIVE DIAGNOSIS:  Same  PROCEDURE:  OPEN REDUCTION INTERNAL FIXATION (ORIF) PROXIMAL HUMERUS FRACTURE  SURGEON:  Johnny Bridge, MD  PHYSICIAN ASSISTANT: Joya Gaskins, OPA-C, present and scrubbed throughout the case, critical for completion in a timely fashion, and for retraction, instrumentation, and closure.  ANESTHESIA:   General  PREOPERATIVE INDICATIONS:  [name] is a  63 y.o. female with a diagnosis of left humerus fracture who elected for surgical management.    The risks benefits and alternatives were discussed with the patient including but not limited to the risks of nonoperative treatment, versus surgical intervention including infection, bleeding, nerve injury, malunion, nonunion, the need for revision surgery, hardware prominence, hardware failure, the need for hardware removal, blood clots, cardiopulmonary complications, conversion to arthroplasty, morbidity, mortality, among others, and they were willing to proceed.  Predicted outcome is good, although there will be at least a six to nine month expected recovery.    OPERATIVE IMPLANTS: Biomet S3 locking plate  OPERATIVE FINDINGS: Displaced proximal humerus fracture  OPERATIVE PROCEDURE: The patient was brought to the operating room and placed in the supine position. General anesthesia was administered. IV antibiotics were given. She was placed in the beach chair position. All bony prominences were padded. The upper extremity was prepped and draped in usual sterile fashion. Deltopectoral incision was performed.  I exposed the fracture site, and placed deep retractors. I did not tenotomize the biceps tendon. This was left in place. I elevated a small portion of the deltoid off of the shaft, in order to gain access for the plate.   I applied the plate and secured it into the sliding hole  first. I confirmed position of the reduction and the plate with C-arm, and I placed a total of 2 guidewires into the appropriate position in the head. I was satisfied that the plate was distal appropriately, and then secured the plate proximally with smooth pegs, taking care to prevent penetration into the arch articular surface, using C-arm, as well as manual feel using a hand drill.  The anterior superior screw preparation penetrated, and the peg wouldn't lock, so the fixation was abandoned, as I already had satisfactory fixation..  I then secured the plate distally using another cortical screw. Once complete fixation and reduction of been achieved, took final C-arm pictures, and irrigated the wounds copiously, and repaired the deltopectoral interval with Vicryl followed by Vicryl for the subcutaneous tissue with Monocryl and Steri-Strips for the skin. She was placed in a sling. She had a preoperative regional block as well. She tolerated the procedure well with no complications.

## 2014-02-23 NOTE — H&P (Signed)
PREOPERATIVE H&P  Chief Complaint: left humerus fracture  HPI: [name] is a 63 y.o. female who presents for preoperative history and physical with a diagnosis of left humerus fracture. Symptoms are rated as moderate to severe, and have been worsening.  This is significantly impairing activities of daily living.  She has elected for surgical management. The fracture occurred approximately a week ago, and has had significant displacement, surgical intervention recommended in order to optimize long-term function.  Past Medical History  Diagnosis Date  . Diabetes mellitus without complication   . Neuropathic pain   . Coronary artery disease   . Myocardial infarct   . Complication of anesthesia   . PONV (postoperative nausea and vomiting)   . Panic attacks     Hx: of   Past Surgical History  Procedure Laterality Date  . Cardiac stents    . Colon surgery    . Eye surgery    . Mastectomy      Hx: of right breast  . Breast surgery      Hx; of lumpectomy of right breast  . Cholecystectomy    . Appendectomy    . Tubal ligation    . Dilation and curettage of uterus    . Cardiac catheterization     History   Social History  . Marital Status: Widowed    Spouse Name: N/A    Number of Children: N/A  . Years of Education: N/A   Social History Main Topics  . Smoking status: Never Smoker   . Smokeless tobacco: Never Used  . Alcohol Use: No  . Drug Use: No  . Sexual Activity: None   Other Topics Concern  . None   Social History Narrative  . None   Family History  Problem Relation Age of Onset  . Diabetes Mother   . Hypertension Mother   . Stroke Mother   . Diabetes Brother    Allergies  Allergen Reactions  . Cymbalta [Duloxetine Hcl] Other (See Comments)    Vomiting and stiff muscles in legs  . Toradol [Ketorolac Tromethamine] Hives   Prior to Admission medications   Medication Sig Start Date End Date Taking? Authorizing Provider  atorvastatin (LIPITOR)  40 MG tablet Take 40 mg by mouth at bedtime.   Yes Historical Provider, MD  calcium carbonate (OS-CAL) 600 MG TABS tablet Take 600 mg by mouth daily.    Yes Historical Provider, MD  clopidogrel (PLAVIX) 75 MG tablet Take 75 mg by mouth daily with breakfast.   Yes Historical Provider, MD  cyclobenzaprine (FLEXERIL) 10 MG tablet Take 10 mg by mouth at bedtime.   Yes Historical Provider, MD  gabapentin (NEURONTIN) 600 MG tablet Take 600 mg by mouth 3 (three) times daily.   Yes Historical Provider, MD  insulin aspart (NOVOLOG) 100 UNIT/ML injection Inject 30-60 Units into the skin 3 (three) times daily before meals. If AM blood sugar is =or>200 give 60 units. If AM blood sugar is <200 gives 40 units, and uses sliding schedule at lunch and 30-60 units in the evening depending on blood sugar.   Yes Historical Provider, MD  insulin NPH Human (HUMULIN N,NOVOLIN N) 100 UNIT/ML injection Inject 60 Units into the skin 2 (two) times daily before a meal.   Yes Historical Provider, MD  metoCLOPramide (REGLAN) 10 MG tablet Take 10 mg by mouth at bedtime.   Yes Historical Provider, MD  oxyCODONE-acetaminophen (PERCOCET) 10-325 MG per tablet Take 1 tablet by mouth every 6 (six) hours as  needed for pain.   Yes Historical Provider, MD  traMADol (ULTRAM) 50 MG tablet Take 100 mg by mouth every 8 (eight) hours.   Yes Historical Provider, MD     Positive ROS: All other systems have been reviewed and were otherwise negative with the exception of those mentioned in the HPI and as above.  Physical Exam: General: Alert, no acute distress Cardiovascular: No pedal edema Respiratory: No cyanosis, no use of accessory musculature GI: No organomegaly, abdomen is soft and non-tender Skin: No lesions in the area of chief complaint Neurologic: Sensation intact distally Psychiatric: Patient is competent for consent with normal mood and affect Lymphatic: No axillary or cervical lymphadenopathy  MUSCULOSKELETAL: Left arm has  ecchymosis and swelling. Sensation intact throughout the hand and shoulder. Unable to lift the arm.   Assessment: left humerus fracture with coexisting risk factors as listed above, including her cardiac disease.  Plan: Plan for Procedure(s): OPEN REDUCTION INTERNAL FIXATION (ORIF) PROXIMAL HUMERUS FRACTURE  The risks benefits and alternatives were discussed with the patient including but not limited to the risks of nonoperative treatment, versus surgical intervention including infection, bleeding, nerve injury, malunion, nonunion, the need for revision surgery, hardware prominence, hardware failure, the need for hardware removal, blood clots, cardiopulmonary complications, morbidity, mortality, among others, and they were willing to proceed.     Johnny Bridge, MD Cell [phone]   02/23/2014 7:16 AM

## 2014-02-23 NOTE — Preoperative (Signed)
Beta Blockers   Reason not to administer Beta Blockers:Not Applicable 

## 2014-02-23 NOTE — Anesthesia Postprocedure Evaluation (Signed)
Anesthesia Post Note  Patient: [name]  Procedure(s) Performed: Procedure(s) (LRB): OPEN REDUCTION INTERNAL FIXATION (ORIF) PROXIMAL HUMERUS FRACTURE (Left)  Anesthesia type: General  Patient location: PACU  Post pain: Pain level controlled and Adequate analgesia  Post assessment: Post-op Vital signs reviewed, Patient's Cardiovascular Status Stable, Respiratory Function Stable, Patent Airway and Pain level controlled  Last Vitals:  Filed Vitals:   02/23/14 1230  BP: 147/68  Pulse: 78  Temp:   Resp: 7    Post vital signs: Reviewed and stable  Level of consciousness: awake, alert  and oriented  Complications: No apparent anesthesia complications

## 2014-02-23 NOTE — Progress Notes (Signed)
Orthopedic Tech Progress Note Patient Details:  [name] 08-Mar-1951 [id number] Unable to use Patient ID: [name], female   DOB: 1951/01/12, 63 y.o.   MRN: [id number]   [name] 02/23/2014, 5:38 PM

## 2014-02-23 NOTE — Transfer of Care (Signed)
Immediate Anesthesia Transfer of Care Note  Patient: [name]  Procedure(s) Performed: Procedure(s): OPEN REDUCTION INTERNAL FIXATION (ORIF) PROXIMAL HUMERUS FRACTURE (Left)  Patient Location: PACU  Anesthesia Type:General  Level of Consciousness: awake, alert  and oriented  Airway & Oxygen Therapy: Patient Spontanous Breathing and Patient connected to nasal cannula oxygen  Post-op Assessment: Report given to PACU RN and Post -op Vital signs reviewed and stable  Post vital signs: Reviewed and stable  Complications: No apparent anesthesia complications

## 2014-02-24 LAB — BASIC METABOLIC PANEL
BUN: 15 mg/dL (ref 6–23)
CO2: 24 mEq/L (ref 19–32)
Calcium: 8.2 mg/dL — ABNORMAL LOW (ref 8.4–10.5)
Chloride: 103 mEq/L (ref 96–112)
Creatinine, Ser: 1.08 mg/dL (ref 0.50–1.10)
GFR calc non Af Amer: 53 mL/min — ABNORMAL LOW (ref >90)
GFR, EST AFRICAN AMERICAN: 62 mL/min — AB (ref >90)
Glucose, Bld: 138 mg/dL — ABNORMAL HIGH (ref 70–99)
POTASSIUM: 4 meq/L (ref 3.7–5.3)
Sodium: 138 mEq/L (ref 137–147)

## 2014-02-24 LAB — GLUCOSE, CAPILLARY
GLUCOSE-CAPILLARY: 158 mg/dL — AB (ref 70–99)
GLUCOSE-CAPILLARY: 173 mg/dL — AB (ref 70–99)

## 2014-02-24 LAB — CBC
HCT: 28.7 % — ABNORMAL LOW (ref 36.0–46.0)
Hemoglobin: 9.4 g/dL — ABNORMAL LOW (ref 12.0–15.0)
MCH: 28.7 pg (ref 26.0–34.0)
MCHC: 32.8 g/dL (ref 30.0–36.0)
MCV: 87.8 fL (ref 78.0–100.0)
PLATELETS: 181 10*3/uL (ref 150–400)
RBC: 3.27 MIL/uL — ABNORMAL LOW (ref 3.87–5.11)
RDW: 16.7 % — AB (ref 11.5–15.5)
WBC: 8 10*3/uL (ref 4.0–10.5)

## 2014-02-24 NOTE — Progress Notes (Signed)
   CARE MANAGEMENT NOTE 02/24/2014  Patient:  ADELLA, MANOLIS   Account Number:  [id number]  Date Initiated:  02/24/2014  Documentation initiated by:  [facility]  Subjective/Objective Assessment:   adm: ORIF proximal humerus fracture (left)     Action/Plan:   discharge planning   Anticipated DC Date:  02/24/2014   Anticipated DC Plan:  [place]  CM consult      [facility] Choice  HOME HEALTH   Choice offered to / List presented to:  C-1 Patient        [place] arranged  HH-3 OT  HH-2 PT      [place] agency  [place]   Status of service:  Completed, signed off Medicare Important Message given?   (If response is "NO", the following Medicare IM given date fields will be blank) Date Medicare IM given:   Date Additional Medicare IM given:    Discharge Disposition:  [place]  Per UR Regulation:    If discussed at Long Length of Stay Meetings, dates discussed:    Comments:  02/24/14 12:20 Cm spoke with pt for choice for HHPT/OT.  Pt chooses [place].  Address and contact numbers were verified with pt.  Referral called to [facility].  No other CM needs were communicated.  Mariane Masters, BSN, CM [phone].

## 2014-02-24 NOTE — Evaluation (Signed)
Occupational Therapy Evaluation Patient Details Name: [name] MRN: [id number] DOB: October 04, 1951 Today's Date: 02/24/2014 Time: 9233-0076 OT Time Calculation (min): 33 min  OT Assessment / Plan / Recommendation History of present illness 63 y.o. s/p ORIF proximal humerus fracture (left)   Clinical Impression   Education provided to pt as well as handout. Pt states she is unsure how long daughter is going to stay with her. Recommending HHOT upon d/c.     OT Assessment  All further OT needs can be met in the next venue of care    Follow Up Recommendations  Home health OT;Supervision/Assistance - 24 hour    Barriers to Discharge      Equipment Recommendations  None recommended by OT    Recommendations for Other Services  PT consult  Frequency       Precautions / Restrictions Precautions Precautions: Shoulder;Fall Type of Shoulder Precautions: No shoulder movement Shoulder Interventions: Shoulder sling/immobilizer;Off for dressing/bathing/exercises;At all times Precaution Booklet Issued: Yes (comment) Precaution Comments: Reviewed precautions with pt Required Braces or Orthoses: Sling Restrictions Weight Bearing Restrictions: Yes LUE Weight Bearing: Non weight bearing   Pertinent Vitals/Pain Pain 6/10. Pt back in bed at end of session with pillows in L-shape.    ADL  Grooming: Wash/dry hands;Supervision/safety Where Assessed - Grooming: Supported standing Upper Body Dressing: Maximal assistance Where Assessed - Upper Body Dressing: Unsupported sitting Toilet Transfer: Copy Method: Sit to Loss adjuster, chartered: Raised toilet seat with arms (or 3-in-1 over toilet) Toileting - Clothing Manipulation and Hygiene: Supervision/safety Where Assessed - Toileting Clothing Manipulation and Hygiene: Sit to stand from 3-in-1 or toilet Tub/Shower Transfer Method: Not assessed Equipment Used: Gait belt;Other (comment)  (sling) Transfers/Ambulation Related to ADLs: Supervision/Min guard-pt with decreased balance at times. ADL Comments: Reviewed shoulder information and gave handout to pt. Pt performed UE exercises to elbow, wrist, and hand. Pt planning to sponge bathe for a while.    OT Diagnosis: Acute pain  OT Problem List: Decreased range of motion;Decreased activity tolerance;Impaired UE functional use;Decreased knowledge of precautions;Pain;Impaired balance (sitting and/or standing);Decreased knowledge of use of DME or AE OT Treatment Interventions:     OT Goals(Current goals can be found in the care plan section)    Visit Information  Last OT Received On: 02/24/14 Assistance Needed: +1 History of Present Illness: 63 y.o. s/p ORIF proximal humerus fracture (left)       Prior [place] expects to be discharged to:: Private residence Living Arrangements: Alone Available Help at Discharge: Family (daughter planning on staying 2 weeks but not sure) Type of Home: House Home Access: Stairs to enter [employer] of Steps: 3 Entrance Stairs-Rails: None Home Layout: One level Home Equipment: Toilet riser;Walker - 2 wheels;Shower seat;Bedside commode;Cane - single point Prior Function Level of Independence: Independent Communication Communication: No difficulties Dominant Hand: Right         Vision/Perception Vision - History Baseline Vision: Wears glasses all the time Visual History: Retinopathy   Cognition  Cognition Arousal/Alertness: Awake/alert Behavior During Therapy: WFL for tasks assessed/performed Overall Cognitive Status: Within Functional Limits for tasks assessed    Extremity/Trunk Assessment Upper Extremity Assessment Upper Extremity Assessment: LUE deficits/detail LUE Deficits / Details: ORIF left proximal humerus fracture     Mobility Bed Mobility Overal bed mobility: Needs Assistance Bed Mobility: Sit to Supine;Supine to  Sit Supine to sit: Mod assist Sit to supine: Min guard General bed mobility comments: Assist to lift trunk. Educated to not  put weight through LUE. Transfers Overall transfer level: Needs assistance Equipment used: None Transfers: Sit to/from Stand Sit to Stand: Supervision General transfer comment: Supervision for safety.     Exercise Shoulder Exercises Elbow Flexion: AROM;Left;10 reps;Standing Elbow Extension: AROM;Left;10 reps;Standing Wrist Flexion: AROM;Left;10 reps;Standing Wrist Extension: AROM;Left;10 reps;Standing Digit Composite Flexion: AROM;Left;10 reps;Standing Composite Extension: AROM;Left;10 reps;Standing Neck Flexion: AROM;5 reps;Seated Neck Extension: AROM;5 reps;Seated Neck Lateral Flexion - Right: AROM;5 reps;Seated Neck Lateral Flexion - Left: AROM;5 reps;Seated Donning/doffing shirt without moving shoulder:  (educated on technique) Method for sponge bathing under operated UE:  (educated on technique) Donning/doffing sling/immobilizer: Maximal assistance (educated on technique and OT demonstrated) Correct positioning of sling/immobilizer: Minimal assistance;Moderate assistance ROM for elbow, wrist and digits of operated UE: Supervision/safety Sling wearing schedule (on at all times/off for ADL's): Supervision/safety Proper positioning of operated UE when showering:  (educated on technique) Positioning of UE while sleeping: Patient able to independently direct caregiver   Balance     End of Session OT - End of Session Equipment Utilized During Treatment: Gait belt;Other (comment) (sling) Activity Tolerance: Patient tolerated treatment well Patient left: in bed;with call bell/phone within reach Nurse Communication: Other (comment) (PT order and HHOT)  GO Functional Assessment Tool Used: clinical judgment Functional Limitation: Self care Self Care Current Status ([id number]): At least 40 percent but less than 60 percent impaired, limited or restricted Self Care  Goal Status (M3559): At least 40 percent but less than 60 percent impaired, limited or restricted Self Care Discharge Status [phone]): At least 40 percent but less than 60 percent impaired, limited or restricted   Benito Mccreedy OTR/L [phone] 02/24/2014, 11:44 AM

## 2014-02-25 NOTE — Discharge Summary (Signed)
Physician Discharge Summary  Patient ID: [name] MRN: [id number] DOB/AGE: [birth year] 63 y.o.  Admit date: 02/23/2014 Discharge date: 02/24/2014  Admission Diagnoses:  Fracture of humerus, proximal, left, closed  Discharge Diagnoses:  Principal Problem:   Fracture of humerus, proximal, left, closed Active Problems:   Proximal humerus fracture   Past Medical History  Diagnosis Date  . Diabetes mellitus without complication   . Neuropathic pain   . Coronary artery disease   . Myocardial infarct   . Complication of anesthesia   . PONV (postoperative nausea and vomiting)   . Panic attacks     Hx: of  . Fracture of humerus, proximal, left, closed 02/23/2014    Surgeries: Procedure(s): OPEN REDUCTION INTERNAL FIXATION (ORIF) PROXIMAL HUMERUS FRACTURE on 02/23/2014   Consultants (if any):    Discharged Condition: Improved  Hospital Course: [name] is an 63 y.o. female who was admitted 02/23/2014 with a diagnosis of Fracture of humerus, proximal, left, closed and went to the operating room on 02/23/2014 and underwent the above named procedures.    She was given perioperative antibiotics:  Anti-infectives   Start     Dose/Rate Route Frequency Ordered Stop   02/23/14 1430  ceFAZolin (ANCEF) IVPB 2 g/50 mL premix     2 g 100 mL/hr over 30 Minutes Intravenous Every 6 hours 02/23/14 1356 02/24/14 0316   02/23/14 0600  ceFAZolin (ANCEF) IVPB 2 g/50 mL premix     2 g 100 mL/hr over 30 Minutes Intravenous On call to O.R. 02/22/14 1356 02/23/14 0800    .  She was given sequential compression devices, early ambulation, and plavix and for DVT prophylaxis.  She benefited maximally from the hospital stay and there were no complications.    Recent vital signs:  Filed Vitals:   02/24/14 1259  BP: 126/56  Pulse: 76  Temp: 98.4 F (36.9 C)  Resp: 18    Recent laboratory studies:  Lab Results  Component Value Date   HGB 9.4* 02/24/2014   HGB 10.5*  02/23/2014   HGB 12.2 10/14/2013   Lab Results  Component Value Date   WBC 8.0 02/24/2014   PLT 181 02/24/2014   No results found for this basename: INR   Lab Results  Component Value Date   NA 138 02/24/2014   K 4.0 02/24/2014   CL 103 02/24/2014   CO2 24 02/24/2014   BUN 15 02/24/2014   CREATININE 1.08 02/24/2014   GLUCOSE 138* 02/24/2014    Discharge Medications:     Medication List         atorvastatin 40 MG tablet  Commonly known as:  LIPITOR  Take 40 mg by mouth at bedtime.     calcium carbonate 600 MG Tabs tablet  Commonly known as:  OS-CAL  Take 600 mg by mouth daily.     clopidogrel 75 MG tablet  Commonly known as:  PLAVIX  Take 75 mg by mouth daily with breakfast.     cyclobenzaprine 10 MG tablet  Commonly known as:  FLEXERIL  Take 1 tablet (10 mg total) by mouth at bedtime.     gabapentin 600 MG tablet  Commonly known as:  NEURONTIN  Take 600 mg by mouth 3 (three) times daily.     insulin aspart 100 UNIT/ML injection  Commonly known as:  novoLOG  Inject 30-60 Units into the skin 3 (three) times daily before meals. If AM blood sugar is =or>200 give 60 units. If AM blood sugar is <200 gives  40 units, and uses sliding schedule at lunch and 30-60 units in the evening depending on blood sugar.     insulin NPH Human 100 UNIT/ML injection  Commonly known as:  HUMULIN N,NOVOLIN N  Inject 60 Units into the skin 2 (two) times daily before a meal.     metoCLOPramide 10 MG tablet  Commonly known as:  REGLAN  Take 10 mg by mouth at bedtime.     ondansetron 4 MG tablet  Commonly known as:  ZOFRAN  Take 1 tablet (4 mg total) by mouth every 8 (eight) hours as needed for nausea or vomiting.     oxyCODONE-acetaminophen 10-325 MG per tablet  Commonly known as:  PERCOCET  Take 1 tablet by mouth every 6 (six) hours as needed for pain.     sennosides-docusate sodium 8.6-50 MG tablet  Commonly known as:  SENOKOT-S  Take 2 tablets by mouth daily.     traMADol 50 MG  tablet  Commonly known as:  ULTRAM  Take 100 mg by mouth every 8 (eight) hours.        Diagnostic Studies: Ct Shoulder Left Wo Contrast  02/20/2014   CLINICAL DATA:  Proximal humerus fracture.  EXAM: CT OF THE LEFT SHOULDER WITHOUT CONTRAST  TECHNIQUE: Multidetector CT imaging was performed according to the standard protocol. Multiplanar CT image reconstructions were also generated.  COMPARISON:  None.  FINDINGS: Severe osteopenia. There is a comminuted angulated displaced proximal humeral metaphysis fracture. Rotation of cortical metaphyseal fragment present along with lateral displacement of a fragment of the metaphysis. Anterior displacement of the humeral shaft is 1/2 shaft width. Apex lateral angulation is present. Hematomas present in the lateral deltoid. Lateral displacement of cortical shard is about 1 shaft width. Osteopenia is present.  There is no fracture extending into the lesser tuberosity or humeral head. The greater trochanter in rotator cuff footprint are intact. Glenohumeral Joint appears within normal limits without osteophytes. The fracture appears extra-articular, without hemarthrosis. The acromion is type 2 with mild AC joint osteoarthritis. Distal clavicle is intact. Visualized left chest is within normal limits with scattered areas of scarring and subsegmental atelectasis. Atherosclerosis is noted along with coronary artery disease/ severe Coronary artery calcification. The scapula is intact.  IMPRESSION: 1. Comminuted extra-articular mildly angulated and displaced proximal left humeral metaphysis fracture. No extension to the head or tuberosities. 2. Osteopenia.   Electronically Signed   By: Dereck Ligas M.D.   On: 02/20/2014 16:57   Dg Humerus Left  02/23/2014   CLINICAL DATA:  . are IF  EXAM: LEFT HUMERUS - 2+ VIEW  COMPARISON:  CT SHOULDER*L* W/O CM dated 02/20/2014  FINDINGS: Patient is status post plate and screw fixation of proximal left humeral fracture with near anatomic  alignment.  IMPRESSION: ORIF proximal left humeral fracture with near anatomic alignment.   Electronically Signed   By: Marcello Moores  Register   On: 02/23/2014 10:43    Disposition: 01-Home or Self Care      Discharge Orders   Future Orders Complete By Expires   Call MD / Call 911  As directed    Comments:     If you experience chest pain or shortness of breath, CALL 911 and be transported to the hospital emergency room.  If you develope a fever above 101 F, pus (white drainage) or increased drainage or redness at the wound, or calf pain, call your surgeon's office.   Constipation Prevention  As directed    Comments:     Drink  plenty of fluids.  Prune juice may be helpful.  You may use a stool softener, such as Colace (over the counter) 100 mg twice a day.  Use MiraLax (over the counter) for constipation as needed.   Diet general  As directed    Discharge instructions  As directed    Comments:     Change dressing in 3 days and reapply fresh dressing, unless you have a splint (half cast).  If you have a splint/cast, just leave in place until your follow-up appointment.    Keep wounds dry for 3 weeks.  Leave steri-strips in place on skin.  Do not apply lotion or anything to the wound.   OT splint  As directed 02/23/2015   Comments:     Ok for elbow, wrist, and hand motion.  Sling at all times except for hygiene, no shoulder activity until 6 weeks.      Follow-up Information   Follow up with Johnny Bridge, MD. Schedule an appointment as soon as possible for a visit in 2 weeks.   Specialty:  Orthopedic Surgery   Contact information:   [place] [address] [phone]       Follow up with [facility].   Specialty:  Home Health Services   Contact information:   [place] [address] [phone]       Please follow up. (for physical and occupational therapy)        Signed: Damara Klunder P 02/25/2014, 9:54 AM

## 2014-02-26 ENCOUNTER — Encounter (HOSPITAL_COMMUNITY): Payer: Self-pay | Admitting: Orthopedic Surgery

## 2014-07-10 ENCOUNTER — Ambulatory Visit: Payer: Self-pay | Admitting: Anesthesiology

## 2014-11-14 DIAGNOSIS — G629 Polyneuropathy, unspecified: Secondary | ICD-10-CM | POA: Insufficient documentation

## 2014-11-14 DIAGNOSIS — H3500 Unspecified background retinopathy: Secondary | ICD-10-CM | POA: Insufficient documentation

## 2014-12-13 ENCOUNTER — Ambulatory Visit: Payer: Self-pay | Admitting: Anesthesiology

## 2015-01-31 ENCOUNTER — Encounter: Payer: Self-pay | Admitting: *Deleted

## 2015-02-06 ENCOUNTER — Encounter: Payer: Self-pay | Admitting: General Surgery

## 2015-02-06 ENCOUNTER — Ambulatory Visit: Payer: Medicare Other

## 2015-02-06 ENCOUNTER — Ambulatory Visit (INDEPENDENT_AMBULATORY_CARE_PROVIDER_SITE_OTHER): Payer: Medicare Other | Admitting: General Surgery

## 2015-02-06 VITALS — BP 118/72 | HR 76 | Resp 14 | Ht 69.0 in | Wt 187.0 lb

## 2015-02-06 DIAGNOSIS — N6320 Unspecified lump in the left breast, unspecified quadrant: Secondary | ICD-10-CM

## 2015-02-06 DIAGNOSIS — Z86000 Personal history of in-situ neoplasm of breast: Secondary | ICD-10-CM

## 2015-02-06 DIAGNOSIS — N63 Unspecified lump in breast: Secondary | ICD-10-CM

## 2015-02-06 DIAGNOSIS — Z853 Personal history of malignant neoplasm of breast: Secondary | ICD-10-CM

## 2015-02-06 NOTE — Patient Instructions (Addendum)
Stop Plavix. Left breast biopsy in 1 week.

## 2015-02-06 NOTE — Progress Notes (Signed)
Patient ID: [name], female   DOB: 1951/03/18, 64 y.o.   MRN: [id number]  Chief Complaint  Patient presents with  . Other    left breast cyst evaluation    HPI [name] is a 64 y.o. female who presents for an evaluation of a left breast cyst. The patients most recent mammogram was done on 01/29/15. She denies any problems with the left breast. She has had a right mastectomy in 2012 and cardiac stents placed in 2007  *HPI  Past Medical History  Diagnosis Date  . Diabetes mellitus without complication   . Neuropathic pain   . Coronary artery disease   . Myocardial infarct   . Complication of anesthesia   . PONV (postoperative nausea and vomiting)   . Panic attacks     Hx: of  . Fracture of humerus, proximal, left, closed 02/23/2014    Past Surgical History  Procedure Laterality Date  . Cardiac stents    . Colon surgery    . Eye surgery    . Mastectomy      Hx: of right breast  . Breast surgery      Hx; of lumpectomy of right breast  . Cholecystectomy    . Appendectomy    . Tubal ligation    . Dilation and curettage of uterus    . Cardiac catheterization    . Orif humerus fracture Left 02/23/2014    Procedure: OPEN REDUCTION INTERNAL FIXATION (ORIF) PROXIMAL HUMERUS FRACTURE;  Surgeon: Johnny Bridge, MD;  Location: [facility];  Service: Orthopedics;  Laterality: Left;    Family History  Problem Relation Age of Onset  . Diabetes Mother   . Hypertension Mother   . Stroke Mother   . Diabetes Brother     Social History History  Substance Use Topics  . Smoking status: Never Smoker   . Smokeless tobacco: Never Used  . Alcohol Use: No    Allergies  Allergen Reactions  . Cymbalta [Duloxetine Hcl] Other (See Comments)    Vomiting and stiff muscles in legs  . Toradol [Ketorolac Tromethamine] Hives    Current Outpatient Prescriptions  Medication Sig Dispense Refill  . atorvastatin (LIPITOR) 40 MG tablet Take 40 mg by mouth at bedtime.    .  Cholecalciferol (VITAMIN D PO) Take 1 tablet by mouth daily.    . clopidogrel (PLAVIX) 75 MG tablet Take 75 mg by mouth daily with breakfast.    . cyclobenzaprine (FLEXERIL) 10 MG tablet Take 1 tablet (10 mg total) by mouth at bedtime. 30 tablet 0  . gabapentin (NEURONTIN) 600 MG tablet Take 600 mg by mouth 3 (three) times daily.    . insulin aspart (NOVOLOG) 100 UNIT/ML injection Inject 30-60 Units into the skin 3 (three) times daily before meals. If AM blood sugar is =or>200 give 60 units. If AM blood sugar is <200 gives 40 units, and uses sliding schedule at lunch and 30-60 units in the evening depending on blood sugar.    . insulin NPH Human (HUMULIN N,NOVOLIN N) 100 UNIT/ML injection Inject 60 Units into the skin 2 (two) times daily before a meal.    . metoCLOPramide (REGLAN) 10 MG tablet Take 10 mg by mouth at bedtime.    . traMADol (ULTRAM) 50 MG tablet Take 100 mg by mouth every 8 (eight) hours.     No current facility-administered medications for this visit.    Review of Systems Review of Systems  Constitutional: Negative.   Respiratory: Negative.  Cardiovascular: Negative.     Blood pressure 118/72, pulse 76, resp. rate 14, height 5\' 9"  (1.753 m), weight 187 lb (84.823 kg).  Physical Exam Physical Exam  Constitutional: She is oriented to person, place, and time. She appears well-developed and well-nourished.  Eyes: Conjunctivae are normal. No scleral icterus.  Neck: Neck supple. No thyromegaly present.  Cardiovascular: Normal rate, regular rhythm and normal heart sounds.   Pulmonary/Chest: Effort normal and breath sounds normal. Left breast exhibits no inverted nipple, no mass, no nipple discharge, no skin change and no tenderness.  Well healed right mastectomy site.   Abdominal: Soft. Bowel sounds are normal. There is no tenderness.  Lymphadenopathy:    She has no cervical adenopathy.    She has no axillary adenopathy.  Neurological: She is alert and oriented to person,  place, and time.  Skin: Skin is warm and dry.    Data Reviewed Mammogram reviewed. US guided aspiration left breast- bloody aspirate with no change in the mass/cyst.  Assessment    Likely will benefit with core biopsy after cessation of Plavix. Discussed fully with pt and she is agreeable.      Plan    Stop Plavix today. Core biopsy next week.        Criss Alvine F 02/06/2015, 10:47 AM

## 2015-02-12 ENCOUNTER — Other Ambulatory Visit: Payer: Medicare Other

## 2015-02-12 ENCOUNTER — Ambulatory Visit (INDEPENDENT_AMBULATORY_CARE_PROVIDER_SITE_OTHER): Payer: Medicare Other | Admitting: General Surgery

## 2015-02-12 ENCOUNTER — Encounter: Payer: Self-pay | Admitting: General Surgery

## 2015-02-12 VITALS — BP 140/70 | HR 76 | Resp 14 | Ht 69.0 in | Wt 186.0 lb

## 2015-02-12 DIAGNOSIS — N6320 Unspecified lump in the left breast, unspecified quadrant: Secondary | ICD-10-CM

## 2015-02-12 DIAGNOSIS — N63 Unspecified lump in breast: Secondary | ICD-10-CM

## 2015-02-12 HISTORY — PX: BREAST BIOPSY: SHX20

## 2015-02-12 NOTE — Progress Notes (Signed)
Here today for left breast biopsy. She has been off her plavix this week. Restart plavix tomorrow. Cytology was non revealing. Core biopsy completed. Mass resolved after one pass of needle. Assuming path is benign will recheck in 5 mos with left mammogram. Pt advised.

## 2015-02-12 NOTE — Patient Instructions (Signed)

## 2015-02-13 ENCOUNTER — Telehealth: Payer: Self-pay | Admitting: *Deleted

## 2015-02-13 NOTE — Telephone Encounter (Signed)
Notified patient as instructed, patient pleased. Discussed follow-up appointments, patient agrees  

## 2015-02-13 NOTE — Telephone Encounter (Signed)
-----   Message from Christene Lye, MD sent at 02/13/2015 11:29 AM EST ----- Please let pt pt know the pathology was normal.

## 2015-02-19 IMAGING — CR RIGHT HAND - COMPLETE 3+ VIEW
1 series · 3 of 3 positions shown · non-contrast
Comparison: none

REASON FOR EXAM: shut in ndoor
COMMENTS:

[Series 1: pa · 0.17mm/px · 3 of 3 slices shown]
[im 1/3]
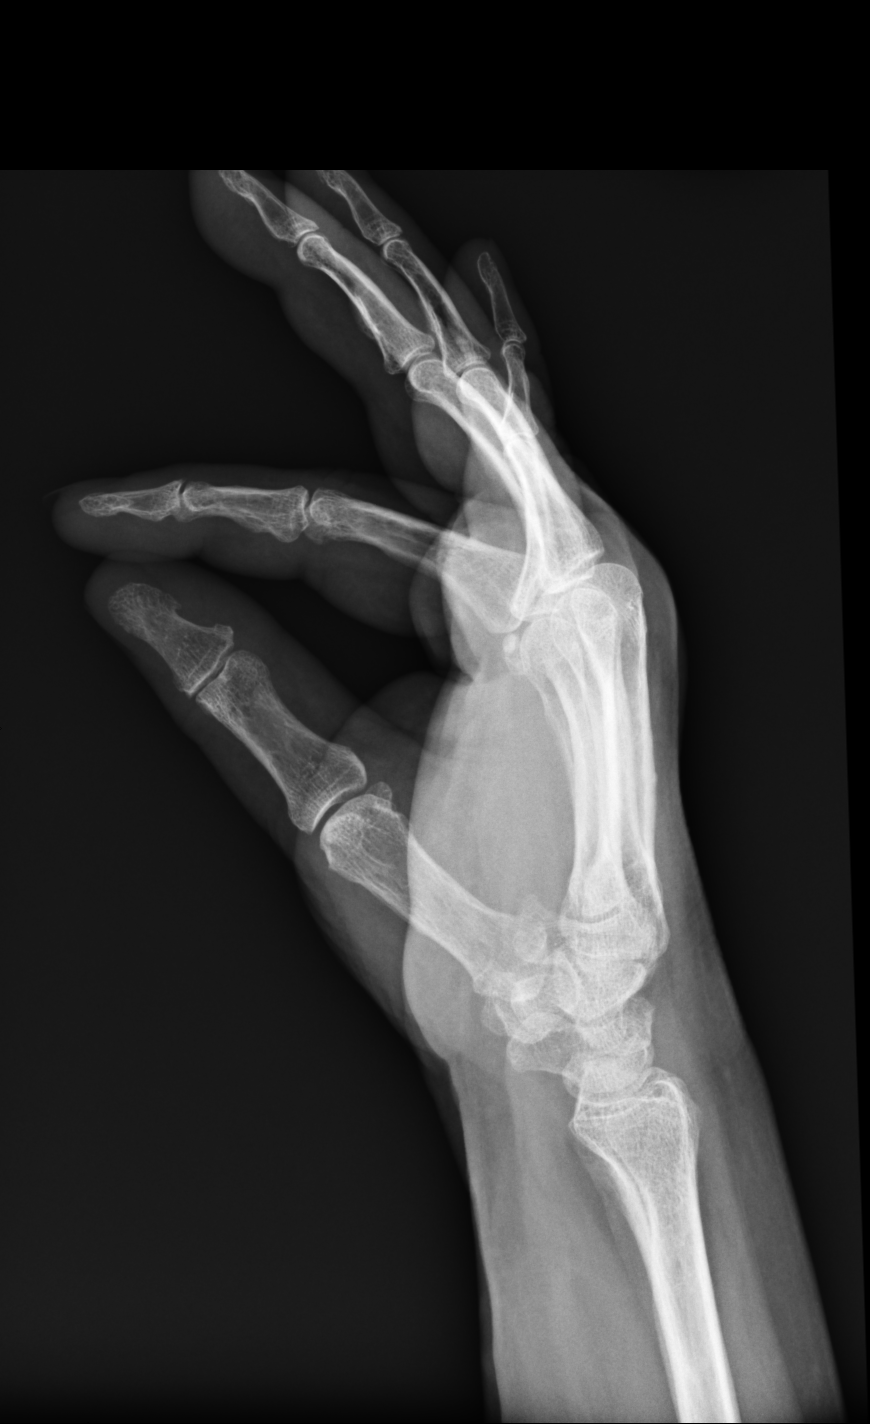
[im 2/3]
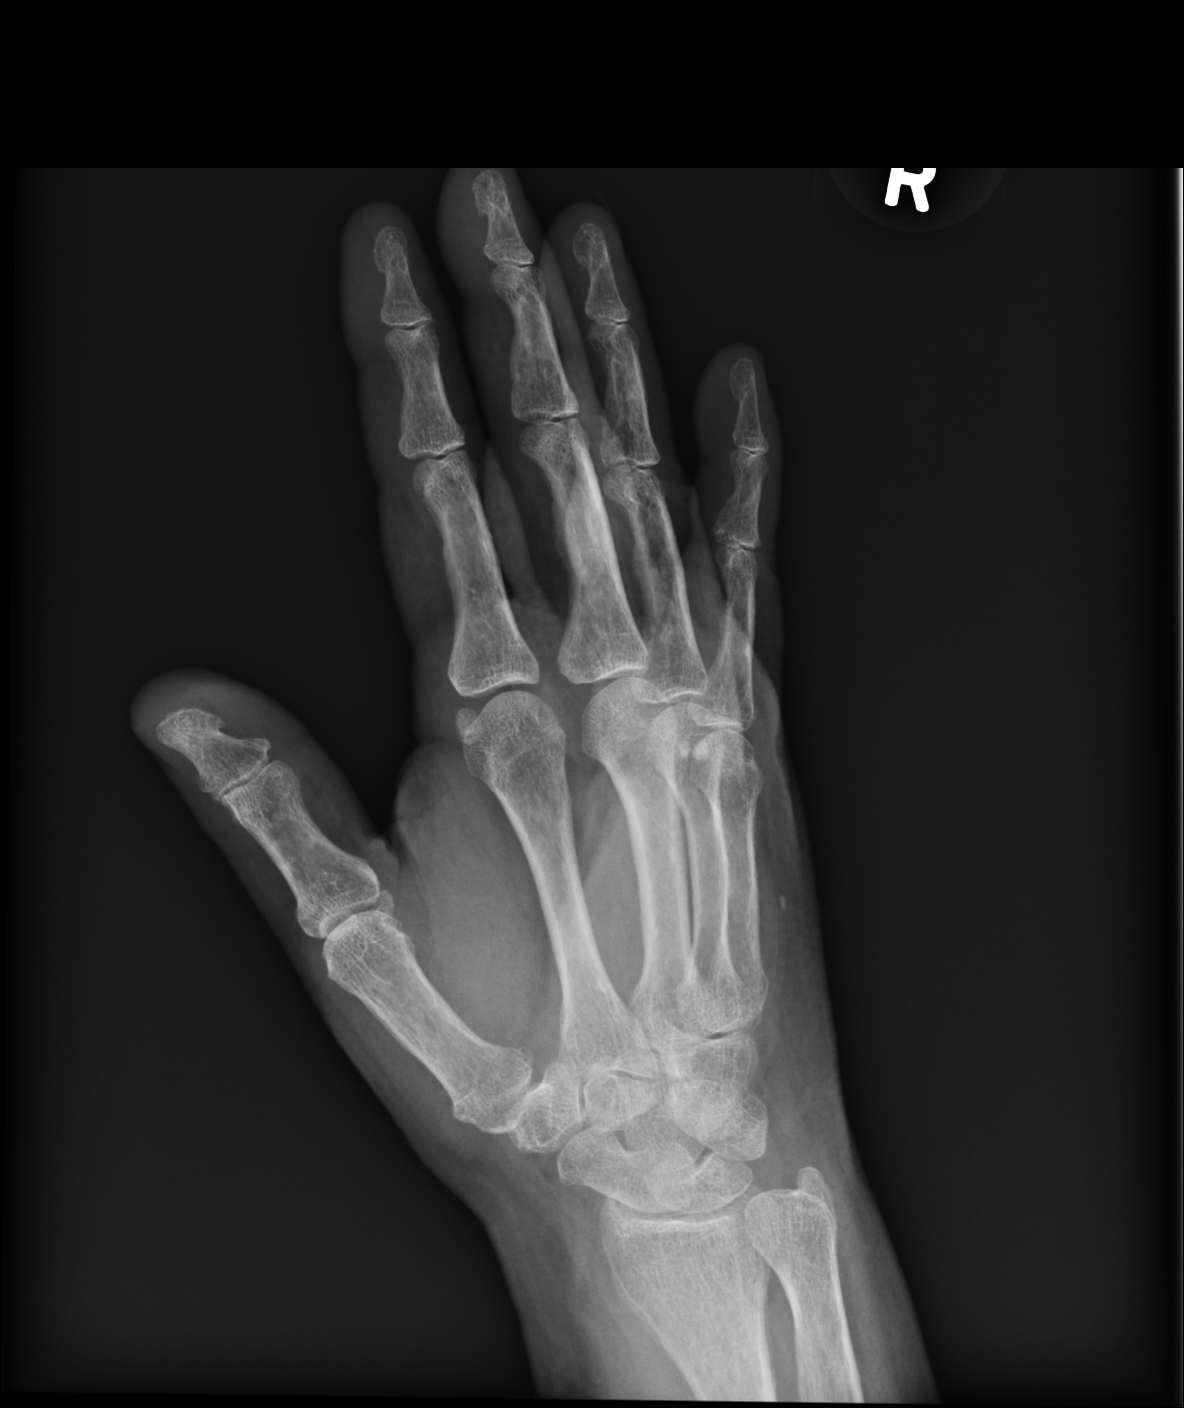
[im 3/3]
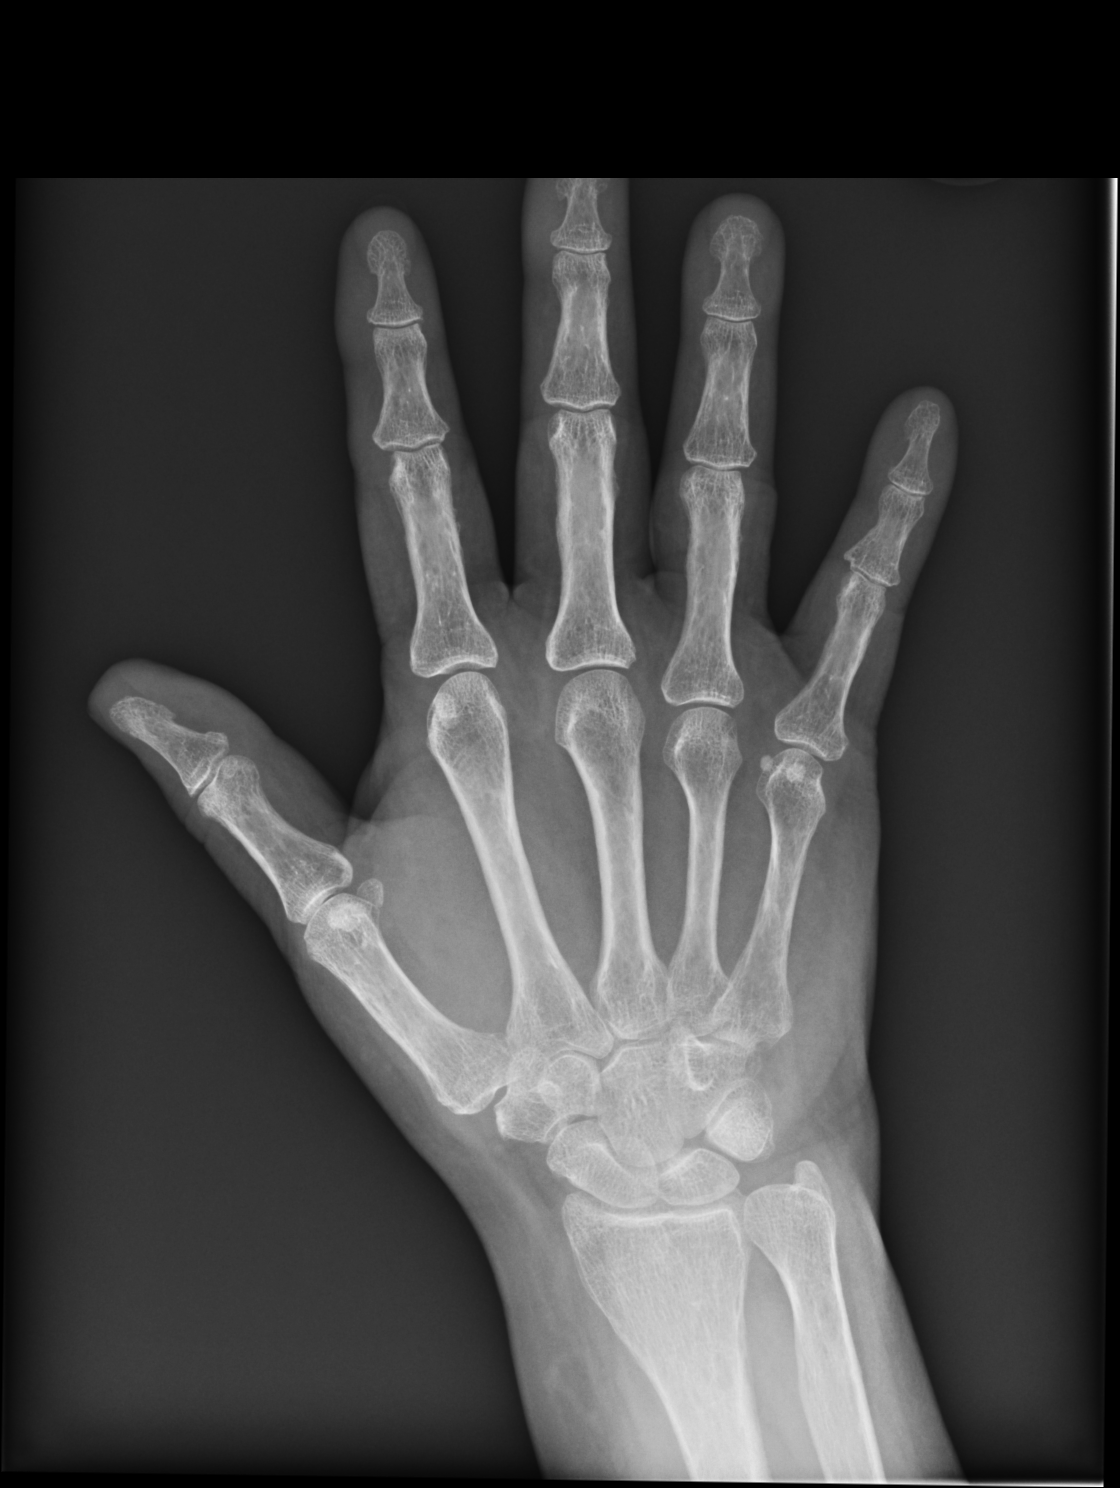

[3 of 3 positions shown; findings below may reference images not displayed]

PROCEDURE:     DXR - DXR HAND RT COMPLETE W/OBLIQUES  - September 25, 2013 [DATE]

RESULT:     Three views of the right hand reveal the bones to be osteopenic.
The interphalangeal joints and metacarpophalangeal joints appear normal.
There is no evidence of acute male metacarpal or phalangeal fracture. The
overlying soft tissues are normal in appearance.
IMPRESSION: There is no acute bony abnormality of the right hand.

## 2015-03-07 ENCOUNTER — Ambulatory Visit: Payer: Self-pay | Admitting: Ophthalmology

## 2015-03-12 ENCOUNTER — Ambulatory Visit: Payer: Self-pay | Admitting: Ophthalmology

## 2015-04-09 ENCOUNTER — Ambulatory Visit
Admit: 2015-04-09 | Disposition: A | Discharge status: Home / Self Care | Payer: Self-pay | Attending: Ophthalmology | Admitting: Ophthalmology

## 2015-04-28 NOTE — Op Note (Signed)
PATIENT NAME:  [name], [name] MR#:  [id number] DATE OF BIRTH:  July 13, 1951  PREOPERATIVE DIAGNOSIS:  Nuclear sclerotic cataract of the left eye.   POSTOPERATIVE DIAGNOSIS:  Nuclear sclerotic cataract of the left eye.   OPERATIVE PROCEDURE:  Cataract extraction by phacoemulsification with implant of intraocular lens to left eye.   SURGEON:  Birder Robson, MD.   ANESTHESIA:  1. Managed anesthesia care.  2. Topical tetracaine drops followed by 2% Xylocaine jelly applied in the preoperative holding area.   COMPLICATIONS:  None.   TECHNIQUE:   Stop and chop.    DESCRIPTION OF PROCEDURE:  The patient was examined and consented in the preoperative holding area where the aforementioned topical anesthesia was applied to the left  eye and then brought back to the Operating Room where the left eye was prepped and draped in the usual sterile ophthalmic fashion and a lid speculum was placed. A paracentesis was created with the side port blade and the anterior chamber was filled with viscoelastic. A near clear corneal incision was performed with the steel keratome. A continuous curvilinear capsulorrhexis was performed with a cystotome followed by the capsulorrhexis forceps. Hydrodissection and hydrodelineation were carried out with BSS on a blunt cannula. The lens was removed in a stop and chop technique and the remaining cortical material was removed with the irrigation-aspiration handpiece. The capsular bag was inflated with viscoelastic and the Tecnis ZCB00 21.0 -diopter lens, serial number [id number], was placed in the capsular bag without complication. The remaining viscoelastic was removed from the eye with the irrigation-aspiration handpiece. The wounds were hydrated. The anterior chamber was flushed with Miostat and the eye was inflated to physiologic pressure. 0.1 mL of cefuroxime concentration 10 mg/mL was placed in the anterior chamber. The wounds were found to be water tight. The eye was  dressed with Vigamox. The patient was given protective glasses to wear throughout the day and a shield with which to sleep tonight. The patient was also given drops with which to begin a drop regimen today and will follow-up with me in one day.    ____________________________ Livingston Diones. Dreden Rivere, MD wlp:kc D: 04/09/2015 21:43:56 ET T: 04/09/2015 22:18:16 ET JOB#: [id number]  cc: Daren Doswell L. Nino Amano, MD, <Dictator> Livingston Diones Makynli Stills MD ELECTRONICALLY SIGNED 04/10/2015 11:07

## 2015-04-28 NOTE — Op Note (Signed)
PATIENT NAME:  [name], MCCOMAS MR#:  [id number] DATE OF BIRTH:  30-Apr-1951  DATE OF PROCEDURE:  03/12/2015  PREOPERATIVE DIAGNOSIS:  Nuclear sclerotic cataract of the right eye.   POSTOPERATIVE DIAGNOSIS:  Nuclear sclerotic cataract of the right eye.   OPERATIVE PROCEDURE:  Cataract extraction by phacoemulsification with implant of intraocular lens to right eye.   SURGEON:  Birder Robson, MD.   ANESTHESIA:  1. Managed anesthesia care.  2. Topical tetracaine drops followed by 2% Xylocaine jelly applied in the preoperative holding area.   COMPLICATIONS:  None.   TECHNIQUE:   Stop and chop.  DESCRIPTION OF PROCEDURE:  The patient was examined and consented in the preoperative holding area where the aforementioned topical anesthesia was applied to the right eye and then brought back to the operating room where the right eye was prepped and draped in the usual sterile ophthalmic fashion and a lid speculum was placed. A paracentesis was created with the side port blade and the anterior chamber was filled with viscoelastic. A near clear corneal incision was performed with the steel keratome. A continuous curvilinear capsulorrhexis was performed with a cystotome followed by the capsulorrhexis forceps. Hydrodissection and hydrodelineation were carried out with BSS on a blunt cannula. The lens was removed in a stop and chop technique and the remaining cortical material was removed with the irrigation-aspiration handpiece. The capsular bag was inflated with viscoelastic and the Tecnis ZCB00 22.0-diopter lens, serial number [id number] was placed in the capsular bag without complication. The remaining viscoelastic was removed from the eye with the irrigation-aspiration handpiece. The wounds were hydrated. The anterior chamber was flushed with Miostat and the eye was inflated to physiologic pressure. 0.1 mL of cefuroxime concentration 10 mg/mL was placed in the anterior chamber. The wounds were found  to be water tight. The eye was dressed with Vigamox. The patient was given protective glasses to wear throughout the day and a shield with which to sleep tonight. The patient was also given drops with which to begin a drop regimen today and will follow-up with me in one day.    ____________________________ Livingston Diones. Virat Prather, MD wlp:bm D: 03/12/2015 21:44:50 ET T: 03/13/2015 06:18:36 ET JOB#: [id number]  cc: Brek Reece L. Skarlette Lattner, MD, <Dictator> Livingston Diones Bruce Paone MD ELECTRONICALLY SIGNED 03/13/2015 11:00

## 2015-07-17 ENCOUNTER — Encounter: Payer: Self-pay | Admitting: Anesthesiology

## 2015-07-17 ENCOUNTER — Ambulatory Visit: Payer: Medicare Other | Attending: Anesthesiology | Admitting: Anesthesiology

## 2015-07-17 VITALS — BP 107/53 | HR 72 | Temp 98.1°F | Resp 18 | Ht 69.0 in | Wt 185.0 lb

## 2015-07-17 DIAGNOSIS — I7300 Raynaud's syndrome without gangrene: Secondary | ICD-10-CM | POA: Diagnosis not present

## 2015-07-17 DIAGNOSIS — E1142 Type 2 diabetes mellitus with diabetic polyneuropathy: Secondary | ICD-10-CM | POA: Diagnosis not present

## 2015-07-17 DIAGNOSIS — M79604 Pain in right leg: Secondary | ICD-10-CM | POA: Diagnosis present

## 2015-07-17 DIAGNOSIS — I429 Cardiomyopathy, unspecified: Secondary | ICD-10-CM | POA: Insufficient documentation

## 2015-07-17 DIAGNOSIS — F411 Generalized anxiety disorder: Secondary | ICD-10-CM

## 2015-07-17 DIAGNOSIS — Z9010 Acquired absence of unspecified breast and nipple: Secondary | ICD-10-CM | POA: Insufficient documentation

## 2015-07-17 DIAGNOSIS — E1141 Type 2 diabetes mellitus with diabetic mononeuropathy: Secondary | ICD-10-CM

## 2015-07-17 DIAGNOSIS — Z79899 Other long term (current) drug therapy: Secondary | ICD-10-CM | POA: Diagnosis not present

## 2015-07-17 DIAGNOSIS — M79605 Pain in left leg: Secondary | ICD-10-CM | POA: Diagnosis present

## 2015-07-17 DIAGNOSIS — F419 Anxiety disorder, unspecified: Secondary | ICD-10-CM | POA: Insufficient documentation

## 2015-07-17 MED ORDER — TRAMADOL HCL 50 MG PO TABS: 100.0000 mg | ORAL_TABLET | Freq: Three times a day (TID) | ORAL | Status: DC

## 2015-07-17 MED ORDER — CYCLOBENZAPRINE HCL 10 MG PO TABS
10.0000 mg | ORAL_TABLET | Freq: Two times a day (BID) | ORAL | Status: DC
Start: 2015-07-17 — End: 2019-03-30

## 2015-07-17 MED ORDER — GABAPENTIN 600 MG PO TABS: 600.0000 mg | ORAL_TABLET | Freq: Three times a day (TID) | ORAL | Status: DC

## 2015-07-17 NOTE — Patient Instructions (Signed)
A prescription for Tramadol was given to you today. A prescription for GABAPENTIN AND FLEXERIL was sent to your pharmacy and should be available for pickup today. RETURN IN 6 MONTHS

## 2015-07-17 NOTE — Progress Notes (Signed)
Safety precautions to be maintained throughout the outpatient stay will include: orient to surroundings, keep bed in low position, maintain call bell within reach at all times, provide assistance with transfer out of bed and ambulation.  Discharged ambulatory at 3:57 pm

## 2015-07-18 NOTE — Progress Notes (Signed)
Chief complaint is bilateral lower extremity pain  Procedure: none  History of present illness: [name] continues to do reasonably well with the current medication regimen. The pain continues to stay  well controlled with no  significant changes noted in baseline symptom complex. No change in lower extremity strength or function or bowel bladder function. Based on the  narcotic assessment sheet, the  patient continues to do well with this current regimen with no evidence of diverting or illicit use and better overall functioning noted. Medications as prescribed. She continues to gain good relief with these medications  BP 107/53 mmHg  Pulse 72  Temp(Src) 98.1 F (36.7 C) (Oral)  Resp 18  Ht 5\' 9"  (1.753 m)  Wt 185 lb (83.915 kg)  BMI 27.31 kg/m2  SpO2 99%   Current outpatient prescriptions:  .  atorvastatin (LIPITOR) 40 MG tablet, Take 40 mg by mouth at bedtime., Disp: , Rfl:  .  Cholecalciferol (VITAMIN D PO), Take 1 tablet by mouth daily., Disp: , Rfl:  .  clopidogrel (PLAVIX) 75 MG tablet, Take 75 mg by mouth daily with breakfast., Disp: , Rfl:  .  cyclobenzaprine (FLEXERIL) 10 MG tablet, Take 1 tablet (10 mg total) by mouth 2 (two) times daily., Disp: 60 tablet, Rfl: 5 .  gabapentin (NEURONTIN) 600 MG tablet, Take 1 tablet (600 mg total) by mouth 3 (three) times daily., Disp: 90 tablet, Rfl: 5 .  insulin aspart (NOVOLOG) 100 UNIT/ML injection, Inject 30-60 Units into the skin 3 (three) times daily before meals. If AM blood sugar is =or>200 give 60 units. If AM blood sugar is <200 gives 40 units, and uses sliding schedule at lunch and 30-60 units in the evening depending on blood sugar., Disp: , Rfl:  .  insulin NPH Human (HUMULIN N,NOVOLIN N) 100 UNIT/ML injection, Inject 60 Units into the skin 2 (two) times daily before a meal., Disp: , Rfl:  .  metoCLOPramide (REGLAN) 10 MG tablet, Take 10 mg by mouth at bedtime., Disp: , Rfl:  .  traMADol (ULTRAM) 50 MG tablet, Take 2  tablets (100 mg total) by mouth every 8 (eight) hours., Disp: 180 tablet, Rfl: 5  Patient Active Problem List   Diagnosis Date Noted  . Fracture of humerus, proximal, left, closed 02/23/2014  . Proximal humerus fracture 02/23/2014    Allergies  Allergen Reactions  . Cymbalta [Duloxetine Hcl] Other (See Comments)    Vomiting and stiff muscles in legs  . Toradol [Ketorolac Tromethamine] Hives    Physical exam:   pupils are equally round and reactive to light  Extraocular muscles are intact   Heart is regular rate and rhythm  Lower extremity strength and function remains at baseline with no significant changes noted on examination today.  Assessment:  #1 diabetic peripheral neuropathy with intractable lower extremity pain   #2 chronic medication management  3.  Raynaud's phenomena  4.  History of CID  5.  Severe cardiomyopathy status post and placement  6.  Anxiety  7. History of mastectomy Plan:   We'll refill medications at present with return to clinic in the next 6 months for reevaluation. Patient is to continue with physical therapy exercises and aerobic conditioning as tolerated and continue follow-up with their primary care physician for baseline medical problems.  Dr. Vashti Hey 9:38 AM

## 2015-07-26 ENCOUNTER — Encounter: Payer: Self-pay | Admitting: General Surgery

## 2015-08-01 ENCOUNTER — Ambulatory Visit: Payer: Medicare Other | Admitting: General Surgery

## 2015-08-05 ENCOUNTER — Ambulatory Visit (INDEPENDENT_AMBULATORY_CARE_PROVIDER_SITE_OTHER): Payer: Medicare Other | Admitting: General Surgery

## 2015-08-05 ENCOUNTER — Encounter: Payer: Self-pay | Admitting: General Surgery

## 2015-08-05 VITALS — BP 126/72 | HR 76 | Resp 14 | Ht 69.0 in | Wt 189.0 lb

## 2015-08-05 DIAGNOSIS — Z853 Personal history of malignant neoplasm of breast: Secondary | ICD-10-CM | POA: Diagnosis not present

## 2015-08-05 DIAGNOSIS — Z86000 Personal history of in-situ neoplasm of breast: Secondary | ICD-10-CM

## 2015-08-05 NOTE — Progress Notes (Signed)
Patient ID: [name], female   DOB: 1951/02/25, 64 y.o.   MRN: [id number]  Chief Complaint  Patient presents with  . Follow-up    mammogram    HPI [name] is a 64 y.o. female with a history of right breast cancer who presents for a breast evaluation. The most recent mammogram was done on 07/26/15. Patient does perform regular self breast checks and gets regular mammograms done. She had a negative left breast core biopsy done on 02/12/15. Patient states no new breast problems. Her friend Darlin Priestly is present for exam.   HPI  Past Medical History  Diagnosis Date  . Diabetes mellitus without complication   . Neuropathic pain   . Coronary artery disease   . Myocardial infarct   . Complication of anesthesia   . PONV (postoperative nausea and vomiting)   . Panic attacks     Hx: of  . Fracture of humerus, proximal, left, closed 02/23/2014  . Cancer     Right DCIS    Past Surgical History  Procedure Laterality Date  . Cardiac stents  2007  . Colon surgery    . Eye surgery    . Mastectomy  2012    Hx: of right breast, DCIS   . Cholecystectomy    . Appendectomy    . Tubal ligation    . Dilation and curettage of uterus    . Cardiac catheterization    . Orif humerus fracture Left 02/23/2014    Procedure: OPEN REDUCTION INTERNAL FIXATION (ORIF) PROXIMAL HUMERUS FRACTURE;  Surgeon: Johnny Bridge, MD;  Location: [facility];  Service: Orthopedics;  Laterality: Left;  . Breast surgery      Hx; of lumpectomy of right breast  . Breast biopsy Left 02/12/15    Family History  Problem Relation Age of Onset  . Diabetes Mother   . Hypertension Mother   . Stroke Mother   . Diabetes Brother     Social History History  Substance Use Topics  . Smoking status: Never Smoker   . Smokeless tobacco: Never Used  . Alcohol Use: No    Allergies  Allergen Reactions  . Cymbalta [Duloxetine Hcl] Other (See Comments)    Vomiting and stiff muscles in legs  . Toradol  [Ketorolac Tromethamine] Hives    Current Outpatient Prescriptions  Medication Sig Dispense Refill  . atorvastatin (LIPITOR) 40 MG tablet Take 40 mg by mouth at bedtime.    . Cholecalciferol (VITAMIN D PO) Take 1 tablet by mouth daily.    . clopidogrel (PLAVIX) 75 MG tablet Take 75 mg by mouth daily with breakfast.    . cyclobenzaprine (FLEXERIL) 10 MG tablet Take 1 tablet (10 mg total) by mouth 2 (two) times daily. 60 tablet 5  . gabapentin (NEURONTIN) 600 MG tablet Take 1 tablet (600 mg total) by mouth 3 (three) times daily. 90 tablet 5  . insulin aspart (NOVOLOG) 100 UNIT/ML injection Inject 30-60 Units into the skin 3 (three) times daily before meals. If AM blood sugar is =or>200 give 60 units. If AM blood sugar is <200 gives 40 units, and uses sliding schedule at lunch and 30-60 units in the evening depending on blood sugar.    . insulin NPH Human (HUMULIN N,NOVOLIN N) 100 UNIT/ML injection Inject 60 Units into the skin 2 (two) times daily before a meal.    . metoCLOPramide (REGLAN) 10 MG tablet Take 10 mg by mouth at bedtime.    . traMADol (ULTRAM) 50 MG tablet  Take 2 tablets (100 mg total) by mouth every 8 (eight) hours. 180 tablet 5   No current facility-administered medications for this visit.    Review of Systems Review of Systems  Constitutional: Negative.   Respiratory: Negative.   Cardiovascular: Negative.     Blood pressure 126/72, pulse 76, resp. rate 14, height 5\' 9"  (1.753 m), weight 189 lb (85.73 kg).  Physical Exam Physical Exam  Constitutional: She is oriented to person, place, and time. She appears well-developed and well-nourished.  HENT:  Mouth/Throat: Oropharynx is clear and moist.  Eyes: Conjunctivae are normal. No scleral icterus.  Neck: Neck supple.  Cardiovascular: Normal rate, regular rhythm and normal heart sounds.   Pulmonary/Chest: Effort normal and breath sounds normal. Left breast exhibits no inverted nipple, no mass, no nipple discharge, no skin  change and no tenderness.  Right mastectomy scar well healed.   Abdominal: Soft. Bowel sounds are normal. There is no tenderness.  Lymphadenopathy:    She has no cervical adenopathy.    She has no axillary adenopathy.  Neurological: She is alert and oriented to person, place, and time.  Skin: Skin is warm and dry.  Psychiatric: Her behavior is normal.    Data Reviewed Mammogram and notes reviewed. Left breast upper outer quadrant shows clip in place with no associated density.   Assessment    History of DCIS right breast post mastectomy. Left breast density resolved.    Plan    The patient has been asked to return to the office in one year with a left screening mammogram.     PCP: Hulda Marin 08/05/2015, 9:59 AM

## 2015-08-05 NOTE — Patient Instructions (Addendum)
The patient has been asked to return to the office in one year with a left screening mammogram.     Continue self breast exams. Call office for any new breast issues or concerns.

## 2016-03-12 DIAGNOSIS — E1142 Type 2 diabetes mellitus with diabetic polyneuropathy: Secondary | ICD-10-CM | POA: Insufficient documentation

## 2016-04-21 DIAGNOSIS — E1143 Type 2 diabetes mellitus with diabetic autonomic (poly)neuropathy: Secondary | ICD-10-CM | POA: Insufficient documentation

## 2016-04-21 DIAGNOSIS — K3184 Gastroparesis: Secondary | ICD-10-CM

## 2016-07-31 ENCOUNTER — Encounter: Payer: Self-pay | Admitting: General Surgery

## 2016-08-03 ENCOUNTER — Encounter: Payer: Self-pay | Admitting: *Deleted

## 2016-08-10 ENCOUNTER — Ambulatory Visit (INDEPENDENT_AMBULATORY_CARE_PROVIDER_SITE_OTHER): Payer: Medicare Other | Admitting: General Surgery

## 2016-08-10 ENCOUNTER — Encounter: Payer: Self-pay | Admitting: General Surgery

## 2016-08-10 VITALS — BP 124/62 | HR 78 | Ht 69.0 in | Wt 191.0 lb

## 2016-08-10 DIAGNOSIS — Z853 Personal history of malignant neoplasm of breast: Secondary | ICD-10-CM | POA: Diagnosis not present

## 2016-08-10 DIAGNOSIS — Z86000 Personal history of in-situ neoplasm of breast: Secondary | ICD-10-CM

## 2016-08-10 NOTE — Progress Notes (Signed)
Patient ID: [name], female   DOB: March 23, 1951, 65 y.o.   MRN: [id number]  Chief Complaint  Patient presents with  . Follow-up    mammogram    HPI [name] is a 65 y.o. female who presents for a breast cancer follow up. The most recent mammogram was done on 07/31/16 at [facility] .  Patient does perform regular self breast checks and gets regular mammograms done. Patient states no new breast changes. Her friend Lytle Butte is present.  I have reviewed the history of present illness with the patient.   HPI  Past Medical History:  Diagnosis Date  . Cancer [facility])    Right DCIS  . Complication of anesthesia   . Coronary artery disease   . Diabetes mellitus without complication ([place])   . Fracture of humerus, proximal, left, closed 02/23/2014  . Myocardial infarct ([place])   . Neuropathic pain   . Panic attacks    Hx: of  . PONV (postoperative nausea and vomiting)     Past Surgical History:  Procedure Laterality Date  . APPENDECTOMY    . BREAST BIOPSY Left 02/12/15  . BREAST SURGERY     Hx; of lumpectomy of right breast  . CARDIAC CATHETERIZATION    . cardiac stents  2007  . CHOLECYSTECTOMY    . COLON SURGERY    . DILATION AND CURETTAGE OF UTERUS    . EYE SURGERY    . MASTECTOMY  2012   Hx: of right breast, DCIS   . ORIF HUMERUS FRACTURE Left 02/23/2014   Procedure: OPEN REDUCTION INTERNAL FIXATION (ORIF) PROXIMAL HUMERUS FRACTURE;  Surgeon: Johnny Bridge, MD;  Location: [facility];  Service: Orthopedics;  Laterality: Left;  . TUBAL LIGATION      Family History  Problem Relation Age of Onset  . Diabetes Mother   . Hypertension Mother   . Stroke Mother   . Diabetes Brother     Social History Social History  Substance Use Topics  . Smoking status: Never Smoker  . Smokeless tobacco: Never Used  . Alcohol use No    Allergies  Allergen Reactions  . Cymbalta [Duloxetine Hcl] Other (See Comments)    Vomiting and stiff muscles in legs  . Toradol  [Ketorolac Tromethamine] Hives    Current Outpatient Prescriptions  Medication Sig Dispense Refill  . atorvastatin (LIPITOR) 40 MG tablet Take 40 mg by mouth at bedtime.    . Cholecalciferol (VITAMIN D PO) Take 1 tablet by mouth daily.    . clopidogrel (PLAVIX) 75 MG tablet Take 75 mg by mouth daily with breakfast.    . cyclobenzaprine (FLEXERIL) 10 MG tablet Take 1 tablet (10 mg total) by mouth 2 (two) times daily. 60 tablet 5  . gabapentin (NEURONTIN) 600 MG tablet Take 1 tablet (600 mg total) by mouth 3 (three) times daily. 90 tablet 5  . insulin aspart (NOVOLOG) 100 UNIT/ML injection Inject 30-60 Units into the skin 3 (three) times daily before meals. If AM blood sugar is =or>200 give 60 units. If AM blood sugar is <200 gives 40 units, and uses sliding schedule at lunch and 30-60 units in the evening depending on blood sugar.    . insulin NPH Human (HUMULIN N,NOVOLIN N) 100 UNIT/ML injection Inject 60 Units into the skin 2 (two) times daily before a meal.    . metoCLOPramide (REGLAN) 10 MG tablet Take 10 mg by mouth at bedtime.    . traMADol (ULTRAM) 50 MG tablet Take 2 tablets (  100 mg total) by mouth every 8 (eight) hours. 180 tablet 5   No current facility-administered medications for this visit.     Review of Systems Review of Systems  Constitutional: Negative.   Respiratory: Negative.   Cardiovascular: Negative.     Blood pressure 124/62, pulse 78, height 5\' 9"  (1.753 m), weight 191 lb (86.6 kg).  Physical Exam Physical Exam  Constitutional: She is oriented to person, place, and time. She appears well-developed and well-nourished.  Eyes: Conjunctivae are normal. No scleral icterus.  Neck: Neck supple. No thyromegaly present.  Cardiovascular: Normal rate, regular rhythm and normal heart sounds.   Pulmonary/Chest: Effort normal and breath sounds normal. Right breast exhibits no inverted nipple, no mass, no nipple discharge, no skin change and no tenderness. Left breast exhibits  no inverted nipple, no mass, no nipple discharge, no skin change and no tenderness.  Lymphadenopathy:    She has no cervical adenopathy.    She has no axillary adenopathy.  Neurological: She is alert and oriented to person, place, and time.  Skin: Skin is warm and dry.  Vitals reviewed.   Data Reviewed Mammogram-reviewed   Assessment    History of DCIS right breast post mastectomy 64yrs ago. Pt also s/p subtotal colectomy No details available on either surgery. Exam is stable.    Plan    The patient has been asked to return to the office in one year with a left breast screening mammogram.        Morris, Rebeca 08/10/2016, 9:20 AM

## 2016-08-10 NOTE — Patient Instructions (Signed)
Continue self breast exams. Call office for any new breast issues or concerns. 

## 2017-06-14 ENCOUNTER — Other Ambulatory Visit: Payer: Self-pay

## 2017-06-14 DIAGNOSIS — Z1231 Encounter for screening mammogram for malignant neoplasm of breast: Secondary | ICD-10-CM

## 2017-06-16 ENCOUNTER — Inpatient Hospital Stay
Admission: RE | Admit: 2017-06-16 | Discharge: 2017-06-16 | Disposition: A | Discharge status: Home / Self Care | Payer: Self-pay | Source: Ambulatory Visit | Attending: *Deleted | Admitting: *Deleted

## 2017-06-16 ENCOUNTER — Other Ambulatory Visit: Payer: Self-pay | Admitting: *Deleted

## 2017-06-16 DIAGNOSIS — Z9289 Personal history of other medical treatment: Secondary | ICD-10-CM

## 2017-08-02 ENCOUNTER — Ambulatory Visit
Admission: RE | Admit: 2017-08-02 | Discharge: 2017-08-02 | Disposition: A | Discharge status: Home / Self Care | Payer: Medicare Other | Source: Ambulatory Visit | Attending: General Surgery | Admitting: General Surgery

## 2017-08-02 DIAGNOSIS — Z9011 Acquired absence of right breast and nipple: Secondary | ICD-10-CM | POA: Insufficient documentation

## 2017-08-02 DIAGNOSIS — Z1231 Encounter for screening mammogram for malignant neoplasm of breast: Secondary | ICD-10-CM | POA: Diagnosis not present

## 2017-08-10 ENCOUNTER — Encounter: Payer: Self-pay | Admitting: *Deleted

## 2017-08-12 ENCOUNTER — Ambulatory Visit (INDEPENDENT_AMBULATORY_CARE_PROVIDER_SITE_OTHER): Payer: Medicare Other | Admitting: General Surgery

## 2017-08-12 ENCOUNTER — Encounter: Payer: Self-pay | Admitting: General Surgery

## 2017-08-12 VITALS — BP 130/70 | HR 71 | Resp 16 | Ht 69.0 in | Wt 199.0 lb

## 2017-08-12 DIAGNOSIS — Z86000 Personal history of in-situ neoplasm of breast: Secondary | ICD-10-CM

## 2017-08-12 NOTE — Progress Notes (Signed)
Patient ID: [name], female   DOB: April 22, 1951, 66 y.o.   MRN: [id number]  Chief Complaint  Patient presents with  . Follow-up    mammogram    HPI [name] is a 66 y.o. female.  who presents for her follow up breast cancer and a breast evaluation. The most recent left mammogram was done on 08-02-17.  Patient does perform regular self breast checks and gets regular mammograms done.  No new issues.  She is here with her neighbor, Daphney. HPI  Past Medical History:  Diagnosis Date  . Cancer [facility])    Right DCIS  . Complication of anesthesia   . Coronary artery disease   . Diabetes mellitus without complication ([place])   . Fracture of humerus, proximal, left, closed 02/23/2014  . Myocardial infarct ([place])   . Neuropathic pain   . Panic attacks    Hx: of  . PONV (postoperative nausea and vomiting)     Past Surgical History:  Procedure Laterality Date  . APPENDECTOMY    . BREAST BIOPSY Left 02/12/15  . BREAST SURGERY     Hx; of lumpectomy of right breast  . CARDIAC CATHETERIZATION    . cardiac stents  2007  . CHOLECYSTECTOMY    . COLON SURGERY    . DILATION AND CURETTAGE OF UTERUS    . EYE SURGERY    . MASTECTOMY  2012   Hx: of right breast, DCIS   . ORIF HUMERUS FRACTURE Left 02/23/2014   Procedure: OPEN REDUCTION INTERNAL FIXATION (ORIF) PROXIMAL HUMERUS FRACTURE;  Surgeon: Johnny Bridge, MD;  Location: [facility];  Service: Orthopedics;  Laterality: Left;  . TUBAL LIGATION      Family History  Problem Relation Age of Onset  . Diabetes Mother   . Hypertension Mother   . Stroke Mother   . Diabetes Brother   . Breast cancer Neg Hx     Social History Social History  Substance Use Topics  . Smoking status: Never Smoker  . Smokeless tobacco: Never Used  . Alcohol use No    Allergies  Allergen Reactions  . Cymbalta [Duloxetine Hcl] Other (See Comments)    Vomiting and stiff muscles in legs  . Toradol [Ketorolac Tromethamine] Hives     Current Outpatient Prescriptions  Medication Sig Dispense Refill  . atorvastatin (LIPITOR) 40 MG tablet Take 40 mg by mouth at bedtime.    . Cholecalciferol (VITAMIN D PO) Take 1 tablet by mouth daily.    . clopidogrel (PLAVIX) 75 MG tablet Take 75 mg by mouth daily with breakfast.    . cyclobenzaprine (FLEXERIL) 10 MG tablet Take 1 tablet (10 mg total) by mouth 2 (two) times daily. 60 tablet 5  . gabapentin (NEURONTIN) 600 MG tablet Take 1 tablet (600 mg total) by mouth 3 (three) times daily. 90 tablet 5  . insulin aspart (NOVOLOG) 100 UNIT/ML injection Inject 30-60 Units into the skin 3 (three) times daily before meals. If AM blood sugar is =or>200 give 60 units. If AM blood sugar is <200 gives 40 units, and uses sliding schedule at lunch and 30-60 units in the evening depending on blood sugar.    . insulin NPH Human (HUMULIN N,NOVOLIN N) 100 UNIT/ML injection Inject 60 Units into the skin 2 (two) times daily before a meal.    . metoCLOPramide (REGLAN) 10 MG tablet Take 10 mg by mouth at bedtime.    . traMADol (ULTRAM) 50 MG tablet Take 2 tablets (100 mg total) by  mouth every 8 (eight) hours. 180 tablet 5   No current facility-administered medications for this visit.     Review of Systems Review of Systems  Constitutional: Negative.   Respiratory: Negative.   Cardiovascular: Negative.     Blood pressure 130/70, pulse 71, resp. rate 16, height 5\' 9"  (1.753 m), weight 199 lb (90.3 kg).  Physical Exam Physical Exam  Constitutional: She is oriented to person, place, and time. She appears well-developed and well-nourished.  HENT:  Mouth/Throat: Oropharynx is clear and moist.  Eyes: Conjunctivae are normal. No scleral icterus.  Neck: Neck supple.  Cardiovascular: Normal rate, regular rhythm and normal heart sounds.   Pulmonary/Chest: Effort normal and breath sounds normal. Left breast exhibits no inverted nipple, no mass, no nipple discharge, no skin change and no tenderness.     Abdominal: Soft. Bowel sounds are normal. There is no tenderness.  Lymphadenopathy:    She has no cervical adenopathy.    She has no axillary adenopathy.  Neurological: She is alert and oriented to person, place, and time.  Skin: Skin is warm and dry.  Psychiatric: She has a normal mood and affect.    Data Reviewed Mammogram, left screening reviewed  Prior notes, pathology report reviewed  Assessment    6 years s/p right mastectomy for DCIS - in review of prior notes, it was noted that testing for  ER/PR sensitivity was never performed following mastectomy 6 years ago. Pt has not undergone antihormonal therapy. Unclear as to the reason for this. In any event, pt has been stable since the procedure, mammograms have been benign.   Hx of breast cyst, left breast    Plan    Return  here to see Dr. Bary Castilla for yearly mammograms and breast checks.       HPI, Physical Exam, Assessment and Plan have been scribed under the direction and in the presence of Mckinley Jewel, MD Karie Fetch, RN  I have completed the exam and reviewed the above documentation for accuracy and completeness.  I agree with the above.  Haematologist has been used and any errors in dictation or transcription are unintentional.  Seeplaputhur G. Jamal Collin, M.D., F.A.C.S.   Junie Panning G 08/12/2017, 11:04 AM

## 2017-08-12 NOTE — Patient Instructions (Addendum)
Return to Dr. Bary Castilla for yearly mammograms and breast checks.  The patient is aware to call back for any questions or concerns.

## 2017-11-29 ENCOUNTER — Other Ambulatory Visit: Payer: Self-pay

## 2017-11-29 ENCOUNTER — Encounter: Payer: Self-pay | Admitting: Anesthesiology

## 2017-11-29 ENCOUNTER — Ambulatory Visit: Payer: Medicare Other | Attending: Anesthesiology | Admitting: Anesthesiology

## 2017-11-29 VITALS — BP 127/67 | HR 78 | Temp 98.7°F | Resp 20 | Ht 69.0 in | Wt 200.0 lb

## 2017-11-29 DIAGNOSIS — M79672 Pain in left foot: Secondary | ICD-10-CM | POA: Insufficient documentation

## 2017-11-29 DIAGNOSIS — M79601 Pain in right arm: Secondary | ICD-10-CM | POA: Diagnosis not present

## 2017-11-29 DIAGNOSIS — G5690 Unspecified mononeuropathy of unspecified upper limb: Secondary | ICD-10-CM

## 2017-11-29 DIAGNOSIS — M79605 Pain in left leg: Secondary | ICD-10-CM | POA: Insufficient documentation

## 2017-11-29 DIAGNOSIS — Z79899 Other long term (current) drug therapy: Secondary | ICD-10-CM | POA: Insufficient documentation

## 2017-11-29 DIAGNOSIS — G5792 Unspecified mononeuropathy of left lower limb: Secondary | ICD-10-CM | POA: Diagnosis not present

## 2017-11-29 DIAGNOSIS — M79671 Pain in right foot: Secondary | ICD-10-CM | POA: Diagnosis not present

## 2017-11-29 DIAGNOSIS — Z7902 Long term (current) use of antithrombotics/antiplatelets: Secondary | ICD-10-CM | POA: Diagnosis not present

## 2017-11-29 DIAGNOSIS — M79642 Pain in left hand: Secondary | ICD-10-CM | POA: Diagnosis not present

## 2017-11-29 DIAGNOSIS — M79641 Pain in right hand: Secondary | ICD-10-CM | POA: Insufficient documentation

## 2017-11-29 DIAGNOSIS — G5791 Unspecified mononeuropathy of right lower limb: Secondary | ICD-10-CM | POA: Diagnosis not present

## 2017-11-29 DIAGNOSIS — M79602 Pain in left arm: Secondary | ICD-10-CM | POA: Diagnosis not present

## 2017-11-29 DIAGNOSIS — Z794 Long term (current) use of insulin: Secondary | ICD-10-CM | POA: Diagnosis not present

## 2017-11-29 DIAGNOSIS — E1140 Type 2 diabetes mellitus with diabetic neuropathy, unspecified: Secondary | ICD-10-CM | POA: Diagnosis not present

## 2017-11-29 DIAGNOSIS — Z5181 Encounter for therapeutic drug level monitoring: Secondary | ICD-10-CM | POA: Insufficient documentation

## 2017-11-29 DIAGNOSIS — M79604 Pain in right leg: Secondary | ICD-10-CM | POA: Diagnosis not present

## 2017-11-29 DIAGNOSIS — M792 Neuralgia and neuritis, unspecified: Secondary | ICD-10-CM

## 2017-11-29 MED ORDER — TRAMADOL HCL 50 MG PO TABS: 100.0000 mg | ORAL_TABLET | Freq: Three times a day (TID) | ORAL | 0 refills | Status: DC

## 2017-11-29 NOTE — Progress Notes (Signed)
Safety precautions to be maintained throughout the outpatient stay will include: orient to surroundings, keep bed in low position, maintain call bell within reach at all times, provide assistance with transfer out of bed and ambulation.  

## 2017-11-29 NOTE — Progress Notes (Signed)
Subjective:  Patient ID: [name], female    DOB: [birth year]  Age: 66 y.o. MRN: [id number]  CC: Leg Pain (bilateral) and Arm Pain (bilateral and hands)   Procedure: None  HPI [name] presents for reevaluation today.  She was last seen approximately 2-3 years ago and is a patient well-known to me.  She has a history of severe unremitting diabetic neuropathy affecting the hands feet and lower legs.  She has been on gabapentin and tramadol with successful relief of severe pain.  She is followed by her primary care physician for these conditions.  Secondary to the nature of the tramadol she is been referred to [place] for management of this medication.  Based on her narcotic assessment sheet she continues to derive good functional relief of her pain with tramadol with minimal side effects noted.  The quality characteristic distribution of the pain is similar to what she is previously described.  It is mainly a burning aching gnawing type pain present throughout the day and generally improved with medication management.  Otherwise no change in strength to the upper lower extremities or in the quality or characteristic of the pain is noted at this time.  Outpatient Medications Prior to Visit  Medication Sig Dispense Refill  . atorvastatin (LIPITOR) 40 MG tablet Take 40 mg by mouth at bedtime.    . Cholecalciferol (VITAMIN D PO) Take 1 tablet by mouth daily.    . clopidogrel (PLAVIX) 75 MG tablet Take 75 mg by mouth daily with breakfast.    . cyclobenzaprine (FLEXERIL) 10 MG tablet Take 1 tablet (10 mg total) by mouth 2 (two) times daily. 60 tablet 5  . enalapril (VASOTEC) 5 MG tablet Take 5 mg by mouth daily.    Marland Kitchen gabapentin (NEURONTIN) 600 MG tablet Take 1 tablet (600 mg total) by mouth 3 (three) times daily. 90 tablet 5  . insulin aspart (NOVOLOG) 100 UNIT/ML injection Inject 30-60 Units into the skin 3 (three) times daily before meals. If AM blood sugar is =or>200 give 60  units. If AM blood sugar is <200 gives 40 units, and uses sliding schedule at lunch and 30-60 units in the evening depending on blood sugar.    . insulin glargine (LANTUS) 100 UNIT/ML injection Inject 60 Units into the skin 2 (two) times daily.    . metoCLOPramide (REGLAN) 10 MG tablet Take 10 mg by mouth at bedtime.    . traMADol (ULTRAM) 50 MG tablet Take 2 tablets (100 mg total) by mouth every 8 (eight) hours. 180 tablet 5  . insulin NPH Human (HUMULIN N,NOVOLIN N) 100 UNIT/ML injection Inject 60 Units into the skin 2 (two) times daily before a meal.     No facility-administered medications prior to visit.     Review of Systems CNS: No confusion or sedation Cardiac: No angina or palpitation GI: No constipation or abdominal pain  Objective:  BP 127/67 (BP Location: Right Arm, Patient Position: Sitting, Cuff Size: Large)   Pulse 78   Temp 98.7 F (37.1 C) (Oral)   Resp 20   Ht 5\' 9"  (1.753 m)   Wt 200 lb (90.7 kg)   SpO2 100%   BMI 29.53 kg/m    BP Readings from Last 3 Encounters:  11/29/17 127/67  08/12/17 130/70  08/10/16 124/62     Wt Readings from Last 3 Encounters:  11/29/17 200 lb (90.7 kg)  08/12/17 199 lb (90.3 kg)  08/10/16 191 lb (86.6 kg)  Physical Exam Pt is alert and oriented PERRL EOMI HEART IS RRR no murmur or rub LCTA no wheezing or rhales MUSCULOSKELETAL reveals no evidence of hyperalgesia or allodynia affecting the hands or feet.  She describes some mild burning pain with touch overlying the dorsum of both feet.  She has some mild swelling about the lower extremities greater than upper extremities.  She has some mild purpura secondary to the chronic Plavix dosing.  Her strength appears to be acceptable.  Labs  No results found for: HGBA1C Lab Results  Component Value Date   CREATININE 1.08 02/24/2014    -------------------------------------------------------------------------------------------------------------------- Lab Results    Component Value Date   WBC 8.0 02/24/2014   HGB 9.4 (L) 02/24/2014   HCT 28.7 (L) 02/24/2014   PLT 181 02/24/2014   GLUCOSE 138 (H) 02/24/2014   ALT 30 10/13/2013   AST 35 10/13/2013   NA 138 02/24/2014   K 4.0 02/24/2014   CL 103 02/24/2014   CREATININE 1.08 02/24/2014   BUN 15 02/24/2014   CO2 24 02/24/2014    --------------------------------------------------------------------------------------------------------------------- Mm Digital Screening Unilat L  Result Date: 08/02/2017 CLINICAL DATA:  Screening. EXAM: DIGITAL SCREENING UNILATERAL LEFT MAMMOGRAM WITH CAD COMPARISON:  Previous exam(s). ACR Breast Density Category b: There are scattered areas of fibroglandular density. FINDINGS: The patient has had a right mastectomy. There are no findings suspicious for malignancy. Images were processed with CAD. IMPRESSION: No mammographic evidence of malignancy. A result letter of this screening mammogram will be mailed directly to the patient. RECOMMENDATION: Screening mammogram in one year.  (Code:SM-L-29M) BI-RADS CATEGORY  1: Negative. Electronically Signed   By: Pamelia Hoit M.D.   On: 08/02/2017 17:27     Assessment & Plan:   [name] was seen today for leg pain and arm pain.  Diagnoses and all orders for this visit:  Neuropathic pain of foot, right  Neuropathic pain of foot, left  Neuropathic pain of hand, unspecified laterality  Other orders -     traMADol (ULTRAM) 50 MG tablet; Take 2 tablets (100 mg total) by mouth every 8 (eight) hours.        ----------------------------------------------------------------------------------------------------------------------  Problem List Items Addressed This Visit    None    Visit Diagnoses    Neuropathic pain of foot, right    -  Primary   Neuropathic pain of foot, left       Neuropathic pain of hand, unspecified laterality             ----------------------------------------------------------------------------------------------------------------------  1. Neuropathic pain of foot, right I want her to continue follow-up with her primary care physicians for her baseline medical care and medication management.  We will see her on a every three-month basis for her Ultram dosing.  Today we have provided a December 10 fill date for tramadol 50 mg tablets 2 tablets taken 3 times a day 180/month and 540 for 45-month prescription.  She is return to clinic in 3 months for reevaluation.  2. Neuropathic pain of foot, left As above  3. Neuropathic pain of hand, unspecified laterality As above and continue with her baseline medication management including the gabapentin and Flexeril.    ----------------------------------------------------------------------------------------------------------------------  I am having [name] maintain her clopidogrel, atorvastatin, metoCLOPramide, insulin aspart, insulin NPH Human, Cholecalciferol (VITAMIN D PO), cyclobenzaprine, gabapentin, enalapril, insulin glargine, and traMADol.   Meds ordered this encounter  Medications  . traMADol (ULTRAM) 50 MG tablet    Sig: Take 2 tablets (100 mg total) by mouth every  8 (eight) hours.    Dispense:  540 tablet    Refill:  0    Do not fill until [id number]   This is a three month supply     Medication List        Accurate as of 11/29/17  2:03 PM. Always use your most recent med list.          atorvastatin 40 MG tablet Commonly known as:  LIPITOR   clopidogrel 75 MG tablet Commonly known as:  PLAVIX   cyclobenzaprine 10 MG tablet Commonly known as:  FLEXERIL Take 1 tablet (10 mg total) by mouth 2 (two) times daily.   enalapril 5 MG tablet Commonly known as:  VASOTEC   gabapentin 600 MG tablet Commonly known as:  NEURONTIN Take 1 tablet (600 mg total) by mouth 3 (three) times daily.   insulin aspart 100 UNIT/ML  injection Commonly known as:  novoLOG   insulin glargine 100 UNIT/ML injection Commonly known as:  LANTUS   insulin NPH Human 100 UNIT/ML injection Commonly known as:  HUMULIN N,NOVOLIN N   metoCLOPramide 10 MG tablet Commonly known as:  REGLAN   traMADol 50 MG tablet Commonly known as:  ULTRAM Take 2 tablets (100 mg total) by mouth every 8 (eight) hours.   VITAMIN D PO       Where to Get Your Medications    You can get these medications from any pharmacy   Bring a paper prescription for each of these medications  traMADol 50 MG tablet    ----------------------------------------------------------------------------------------------------------------------  Follow-up: Return in about 3 months (around 02/27/2018) for evaluation, med refill.    Molli Barrows, MD

## 2017-12-02 ENCOUNTER — Telehealth: Payer: Self-pay

## 2017-12-02 NOTE — Telephone Encounter (Signed)
Reference number for the call back should be [id number]

## 2017-12-02 NOTE — Telephone Encounter (Signed)
Spoke with patient re; conversation with [facility] and they will fill it today and she will receive by next week.  I did tell patient that this needed to be discussed with DR Andree Elk at her next visit as to where she should have her controlled meds filled.    Spoke with [name] at the number left in que at [facility] for approval to fill.

## 2017-12-02 NOTE — Telephone Encounter (Signed)
Spoke with [facility] and note placed in medication notes section in patients chart.

## 2017-12-02 NOTE — Telephone Encounter (Signed)
Pharmacy has a question regarding her Tramadol script. Please call [phone]

## 2018-01-07 ENCOUNTER — Other Ambulatory Visit: Payer: Self-pay | Admitting: Cardiovascular Disease

## 2018-01-13 ENCOUNTER — Other Ambulatory Visit: Payer: Self-pay | Admitting: Cardiovascular Disease

## 2018-01-13 ENCOUNTER — Ambulatory Visit: Admit: 2018-01-13 | Payer: Medicare Other | Admitting: Cardiovascular Disease

## 2018-01-13 SURGERY — LEFT HEART CATH
Anesthesia: Moderate Sedation | Laterality: Right

## 2018-01-14 ENCOUNTER — Ambulatory Visit
Admission: RE | Admit: 2018-01-14 | Discharge: 2018-01-14 | Disposition: A | Discharge status: Home / Self Care | Payer: Medicare Other | Source: Ambulatory Visit | Attending: Cardiovascular Disease | Admitting: Cardiovascular Disease

## 2018-01-14 ENCOUNTER — Encounter
Admission: RE | Disposition: A | Discharge status: Home / Self Care | Payer: Self-pay | Source: Ambulatory Visit | Attending: Cardiovascular Disease

## 2018-01-14 ENCOUNTER — Encounter: Payer: Self-pay | Admitting: *Deleted

## 2018-01-14 DIAGNOSIS — Z7902 Long term (current) use of antithrombotics/antiplatelets: Secondary | ICD-10-CM | POA: Diagnosis not present

## 2018-01-14 DIAGNOSIS — Z955 Presence of coronary angioplasty implant and graft: Secondary | ICD-10-CM | POA: Diagnosis not present

## 2018-01-14 DIAGNOSIS — E1142 Type 2 diabetes mellitus with diabetic polyneuropathy: Secondary | ICD-10-CM | POA: Diagnosis not present

## 2018-01-14 DIAGNOSIS — I252 Old myocardial infarction: Secondary | ICD-10-CM | POA: Insufficient documentation

## 2018-01-14 DIAGNOSIS — E1151 Type 2 diabetes mellitus with diabetic peripheral angiopathy without gangrene: Secondary | ICD-10-CM | POA: Insufficient documentation

## 2018-01-14 DIAGNOSIS — I110 Hypertensive heart disease with heart failure: Secondary | ICD-10-CM | POA: Diagnosis not present

## 2018-01-14 DIAGNOSIS — I200 Unstable angina: Secondary | ICD-10-CM

## 2018-01-14 DIAGNOSIS — I509 Heart failure, unspecified: Secondary | ICD-10-CM | POA: Diagnosis not present

## 2018-01-14 DIAGNOSIS — E11319 Type 2 diabetes mellitus with unspecified diabetic retinopathy without macular edema: Secondary | ICD-10-CM | POA: Diagnosis not present

## 2018-01-14 DIAGNOSIS — I2510 Atherosclerotic heart disease of native coronary artery without angina pectoris: Secondary | ICD-10-CM | POA: Insufficient documentation

## 2018-01-14 DIAGNOSIS — R0602 Shortness of breath: Secondary | ICD-10-CM

## 2018-01-14 DIAGNOSIS — I7090 Unspecified atherosclerosis: Secondary | ICD-10-CM | POA: Diagnosis not present

## 2018-01-14 DIAGNOSIS — Z8249 Family history of ischemic heart disease and other diseases of the circulatory system: Secondary | ICD-10-CM | POA: Diagnosis not present

## 2018-01-14 DIAGNOSIS — Z794 Long term (current) use of insulin: Secondary | ICD-10-CM | POA: Insufficient documentation

## 2018-01-14 DIAGNOSIS — Z79899 Other long term (current) drug therapy: Secondary | ICD-10-CM | POA: Insufficient documentation

## 2018-01-14 HISTORY — PX: LEFT HEART CATH AND CORONARY ANGIOGRAPHY: CATH118249

## 2018-01-14 LAB — GLUCOSE, CAPILLARY: Glucose-Capillary: 120 mg/dL — ABNORMAL HIGH (ref 65–99)

## 2018-01-14 SURGERY — LEFT HEART CATH
Anesthesia: Moderate Sedation | Laterality: Right

## 2018-01-14 SURGERY — LEFT HEART CATH AND CORONARY ANGIOGRAPHY
Anesthesia: Moderate Sedation | Laterality: Left

## 2018-01-14 MED ORDER — FENTANYL CITRATE (PF) 100 MCG/2ML IJ SOLN
INTRAMUSCULAR | Status: AC
  Filled 2018-01-14: qty 2

## 2018-01-14 MED ORDER — SODIUM CHLORIDE 0.9 % IV SOLN: 250.0000 mL | INTRAVENOUS | Status: DC | PRN

## 2018-01-14 MED ORDER — ACETAMINOPHEN 325 MG PO TABS: 650.0000 mg | ORAL_TABLET | ORAL | Status: DC | PRN

## 2018-01-14 MED ORDER — SODIUM CHLORIDE 0.9% FLUSH: 3.0000 mL | INTRAVENOUS | Status: DC | PRN

## 2018-01-14 MED ORDER — HEPARIN (PORCINE) IN NACL 2-0.9 UNIT/ML-% IJ SOLN
INTRAMUSCULAR | Status: AC
  Filled 2018-01-14: qty 500

## 2018-01-14 MED ORDER — SODIUM CHLORIDE 0.9 % WEIGHT BASED INFUSION
3.0000 mL/kg/h | INTRAVENOUS | Status: AC
  Administered 2018-01-14: 3 mL/kg/h via INTRAVENOUS

## 2018-01-14 MED ORDER — FENTANYL CITRATE (PF) 100 MCG/2ML IJ SOLN
INTRAMUSCULAR | Status: DC | PRN
  Administered 2018-01-14: 25 ug via INTRAVENOUS
  Administered 2018-01-14: 12.5 ug via INTRAVENOUS

## 2018-01-14 MED ORDER — SODIUM CHLORIDE 0.9 % WEIGHT BASED INFUSION: 1.0000 mL/kg/h | INTRAVENOUS | Status: DC

## 2018-01-14 MED ORDER — SODIUM CHLORIDE 0.9% FLUSH: 3.0000 mL | Freq: Two times a day (BID) | INTRAVENOUS | Status: DC

## 2018-01-14 MED ORDER — SODIUM CHLORIDE 0.9 % IV SOLN
250.0000 mL | INTRAVENOUS | Status: DC | PRN
Start: 2018-01-14 — End: 2018-01-14

## 2018-01-14 MED ORDER — MIDAZOLAM HCL 2 MG/2ML IJ SOLN
INTRAMUSCULAR | Status: DC | PRN
  Administered 2018-01-14: 1 mg via INTRAVENOUS

## 2018-01-14 MED ORDER — FUROSEMIDE 10 MG/ML IJ SOLN
INTRAMUSCULAR | Status: AC
  Filled 2018-01-14: qty 4

## 2018-01-14 MED ORDER — ONDANSETRON HCL 4 MG/2ML IJ SOLN: 4.0000 mg | Freq: Four times a day (QID) | INTRAMUSCULAR | Status: DC | PRN

## 2018-01-14 MED ORDER — MIDAZOLAM HCL 2 MG/2ML IJ SOLN
INTRAMUSCULAR | Status: AC
  Filled 2018-01-14: qty 2

## 2018-01-14 MED ORDER — IOPAMIDOL (ISOVUE-300) INJECTION 61%
INTRAVENOUS | Status: DC | PRN
  Administered 2018-01-14: 110 mL via INTRA_ARTERIAL

## 2018-01-14 SURGICAL SUPPLY — 9 items
CATH INFINITI 5FR ANG PIGTAIL (CATHETERS) ×3 IMPLANT
CATH INFINITI 5FR JL4 (CATHETERS) ×3 IMPLANT
CATH INFINITI JR4 5F (CATHETERS) ×3 IMPLANT
DEVICE CLOSURE MYNXGRIP 5F (Vascular Products) ×3 IMPLANT
KIT MANI 3VAL PERCEP (MISCELLANEOUS) ×3 IMPLANT
NEEDLE PERC 18GX7CM (NEEDLE) ×3 IMPLANT
PACK CARDIAC CATH (CUSTOM PROCEDURE TRAY) ×3 IMPLANT
SHEATH PINNACLE 5F 10CM (SHEATH) ×3 IMPLANT
WIRE GUIDERIGHT .035X150 (WIRE) ×3 IMPLANT

## 2018-01-14 NOTE — Discharge Instructions (Signed)
Angiogram  An angiogram is an X-ray test. It is used to look at your blood vessels. For this test, a dye is put into the blood vessel being checked. The dye shows up on X-rays. It helps your doctor see if there is a blockage or other problem in the blood vessel.  What happens before the procedure?   Follow your doctor's instructions about limiting what you eat or drink.   Ask your doctor if you may drink enough water to take any needed medicines the morning of the test.   Plan to have someone take you home after the test.   If you go home the same day as the test, plan to have someone stay with you for 24 hours.  What happens during the procedure?   An IV tube will be put into one of your veins.   You will be given a medicine that makes you relax (sedative).   Your skin will be washed where the thin tube (catheter) will be put in. Hair may be removed from this area. The tube may be put into:  ? Your upper leg area (groin).  ? The fold of your arm, near your elbow.  ? Your wrist.   You will be given a medicine that numbs the area where the tube will be inserted (local anesthetic).   The tube will be inserted into a blood vessel.   Using a type of X-ray (fluoroscopy) to see, your doctor will move the tube into the blood vessel to check it.   Dye will be put in through the tube. X-rays of your blood vessels will then be taken.  Different doctors and hospitals may do this procedure differently.  What happens after the procedure?   If the test is done through the leg, you will be kept in bed lying flat for several hours. You will be told to not bend or cross your legs.   The area where the tube was inserted will be checked often.   The pulse in your feet or wrist will be checked often.   More tests or X-rays may be done.  This information is not intended to replace advice given to you by your health care provider. Make sure you discuss any questions you have with your health care provider.  Document  Released: 03/12/2009 Document Revised: 05/21/2016 Document Reviewed: 05/17/2013  Elsevier Interactive Patient Education  2017 [employer]

## 2018-03-03 ENCOUNTER — Encounter: Payer: Self-pay | Admitting: Anesthesiology

## 2018-03-03 ENCOUNTER — Ambulatory Visit: Payer: Medicare Other | Attending: Anesthesiology | Admitting: Anesthesiology

## 2018-03-03 VITALS — BP 111/52 | HR 81 | Temp 98.3°F | Resp 16 | Ht 69.0 in | Wt 203.0 lb

## 2018-03-03 DIAGNOSIS — M792 Neuralgia and neuritis, unspecified: Secondary | ICD-10-CM

## 2018-03-03 DIAGNOSIS — G5690 Unspecified mononeuropathy of unspecified upper limb: Secondary | ICD-10-CM

## 2018-03-03 DIAGNOSIS — G5793 Unspecified mononeuropathy of bilateral lower limbs: Secondary | ICD-10-CM | POA: Diagnosis not present

## 2018-03-03 DIAGNOSIS — G5791 Unspecified mononeuropathy of right lower limb: Secondary | ICD-10-CM | POA: Diagnosis not present

## 2018-03-03 DIAGNOSIS — G5792 Unspecified mononeuropathy of left lower limb: Secondary | ICD-10-CM

## 2018-03-03 MED ORDER — TRAMADOL HCL 50 MG PO TABS: 100.0000 mg | ORAL_TABLET | Freq: Three times a day (TID) | ORAL | 0 refills | Status: DC

## 2018-03-03 NOTE — Progress Notes (Signed)
Nursing Pain Medication Assessment:  Safety precautions to be maintained throughout the outpatient stay will include: orient to surroundings, keep bed in low position, maintain call bell within reach at all times, provide assistance with transfer out of bed and ambulation.  Medication Inspection Compliance: [name] did not comply with our request to bring her pills to be counted. She was reminded that bringing the medication bottles, even when empty, is a requirement.  Medication: None brought in. Pill/Patch Count: None available to be counted. Bottle Appearance: No container available. Did not bring bottle(s) to appointment. Filled Date: N/A Last Medication intake:  Today

## 2018-04-25 ENCOUNTER — Other Ambulatory Visit: Payer: Self-pay

## 2018-04-25 ENCOUNTER — Encounter: Payer: Self-pay | Admitting: Emergency Medicine

## 2018-04-25 ENCOUNTER — Emergency Department
Admission: EM | Admit: 2018-04-25 | Discharge: 2018-04-25 | Disposition: A | Discharge status: Home / Self Care | Payer: Medicare Other | Attending: Emergency Medicine | Admitting: Emergency Medicine

## 2018-04-25 ENCOUNTER — Emergency Department: Payer: Medicare Other

## 2018-04-25 DIAGNOSIS — I509 Heart failure, unspecified: Secondary | ICD-10-CM | POA: Diagnosis not present

## 2018-04-25 DIAGNOSIS — I2510 Atherosclerotic heart disease of native coronary artery without angina pectoris: Secondary | ICD-10-CM | POA: Diagnosis not present

## 2018-04-25 DIAGNOSIS — Z7902 Long term (current) use of antithrombotics/antiplatelets: Secondary | ICD-10-CM | POA: Diagnosis not present

## 2018-04-25 DIAGNOSIS — R1011 Right upper quadrant pain: Secondary | ICD-10-CM | POA: Diagnosis present

## 2018-04-25 DIAGNOSIS — E119 Type 2 diabetes mellitus without complications: Secondary | ICD-10-CM | POA: Diagnosis not present

## 2018-04-25 DIAGNOSIS — Z79899 Other long term (current) drug therapy: Secondary | ICD-10-CM | POA: Insufficient documentation

## 2018-04-25 DIAGNOSIS — Z794 Long term (current) use of insulin: Secondary | ICD-10-CM | POA: Insufficient documentation

## 2018-04-25 DIAGNOSIS — I252 Old myocardial infarction: Secondary | ICD-10-CM | POA: Diagnosis not present

## 2018-04-25 DIAGNOSIS — Z9049 Acquired absence of other specified parts of digestive tract: Secondary | ICD-10-CM | POA: Diagnosis not present

## 2018-04-25 HISTORY — DX: Heart failure, unspecified: I50.9

## 2018-04-25 LAB — COMPREHENSIVE METABOLIC PANEL
ALBUMIN: 3.6 g/dL (ref 3.5–5.0)
ALT: 24 U/L (ref 14–54)
AST: 42 U/L — AB (ref 15–41)
Alkaline Phosphatase: 98 U/L (ref 38–126)
Anion gap: 6 (ref 5–15)
BILIRUBIN TOTAL: 0.7 mg/dL (ref 0.3–1.2)
BUN: 24 mg/dL — AB (ref 6–20)
CO2: 26 mmol/L (ref 22–32)
CREATININE: 1.25 mg/dL — AB (ref 0.44–1.00)
Calcium: 9.1 mg/dL (ref 8.9–10.3)
Chloride: 101 mmol/L (ref 101–111)
GFR calc Af Amer: 50 mL/min — ABNORMAL LOW (ref >60)
GFR, EST NON AFRICAN AMERICAN: 43 mL/min — AB (ref >60)
GLUCOSE: 220 mg/dL — AB (ref 65–99)
Potassium: 5.1 mmol/L (ref 3.5–5.1)
Sodium: 133 mmol/L — ABNORMAL LOW (ref 135–145)
Total Protein: 8.3 g/dL — ABNORMAL HIGH (ref 6.5–8.1)

## 2018-04-25 LAB — CBC WITH DIFFERENTIAL/PLATELET
BASOS ABS: 0.1 10*3/uL (ref 0–0.1)
Basophils Relative: 1 %
EOS PCT: 5 %
Eosinophils Absolute: 0.4 10*3/uL (ref 0–0.7)
HEMATOCRIT: 37.4 % (ref 35.0–47.0)
Hemoglobin: 12.4 g/dL (ref 12.0–16.0)
LYMPHS ABS: 1.9 10*3/uL (ref 1.0–3.6)
Lymphocytes Relative: 24 %
MCH: 27.6 pg (ref 26.0–34.0)
MCHC: 33.2 g/dL (ref 32.0–36.0)
MCV: 83.2 fL (ref 80.0–100.0)
MONO ABS: 0.5 10*3/uL (ref 0.2–0.9)
MONOS PCT: 7 %
Neutro Abs: 4.8 10*3/uL (ref 1.4–6.5)
Neutrophils Relative %: 63 %
PLATELETS: 297 10*3/uL (ref 150–440)
RBC: 4.5 MIL/uL (ref 3.80–5.20)
RDW: 17.7 % — AB (ref 11.5–14.5)
WBC: 7.7 10*3/uL (ref 3.6–11.0)

## 2018-04-25 LAB — URINALYSIS, COMPLETE (UACMP) WITH MICROSCOPIC
BACTERIA UA: NONE SEEN
Bilirubin Urine: NEGATIVE
GLUCOSE, UA: 50 mg/dL — AB
Hgb urine dipstick: NEGATIVE
KETONES UR: NEGATIVE mg/dL
LEUKOCYTES UA: NEGATIVE
Nitrite: NEGATIVE
Protein, ur: NEGATIVE mg/dL
SPECIFIC GRAVITY, URINE: 1.013 (ref 1.005–1.030)
pH: 6 (ref 5.0–8.0)

## 2018-04-25 LAB — LIPASE, BLOOD: LIPASE: 25 U/L (ref 11–51)

## 2018-04-25 MED ORDER — IOHEXOL 300 MG/ML  SOLN
75.0000 mL | Freq: Once | INTRAMUSCULAR | Status: AC | PRN
  Administered 2018-04-25: 75 mL via INTRAVENOUS

## 2018-04-25 MED ORDER — DICYCLOMINE HCL 10 MG PO CAPS: 10.0000 mg | ORAL_CAPSULE | Freq: Three times a day (TID) | ORAL | 0 refills | Status: DC | PRN

## 2018-04-25 MED ORDER — SODIUM CHLORIDE 0.9 % IV BOLUS: 1000.0000 mL | Freq: Once | INTRAVENOUS | Status: DC

## 2018-04-25 MED ORDER — IOPAMIDOL (ISOVUE-300) INJECTION 61%
30.0000 mL | Freq: Once | INTRAVENOUS | Status: AC | PRN
  Administered 2018-04-25: 30 mL via ORAL

## 2018-04-25 NOTE — ED Provider Notes (Signed)
[facility] Emergency Department Provider Note ____________________________________________   First MD Initiated Contact with Patient 04/25/18 1536     (approximate)  I have reviewed the triage vital signs and the nursing notes.   HISTORY  Chief Complaint Abdominal Pain    HPI [name] is a 67 y.o. female with PMH as noted below and history of multiple abdominal surgeries who presents with abdominal discomfort which has been chronic for months, but more severe and persistent over the last several days, mainly in her right upper abdomen, and described as pressure.  Is worse after eating.  She denies associated nausea or vomiting, change in bowel movements, fever, or other acute symptoms.  Past Medical History:  Diagnosis Date  . CHF (congestive heart failure) ([place])   . Complication of anesthesia   . Coronary artery disease   . Diabetes mellitus without complication ([place])   . Fracture of humerus, proximal, left, closed 02/23/2014  . Myocardial infarct ([place])   . Neuropathic pain   . Panic attacks    Hx: of  . PONV (postoperative nausea and vomiting)     Patient Active Problem List   Diagnosis Date Noted  . Fracture of humerus, proximal, left, closed 02/23/2014  . Proximal humerus fracture 02/23/2014    Past Surgical History:  Procedure Laterality Date  . APPENDECTOMY    . BREAST BIOPSY Left 02/12/15  . BREAST SURGERY     Hx; of lumpectomy of right breast  . CARDIAC CATHETERIZATION    . cardiac stents  2007  . CHOLECYSTECTOMY    . COLON SURGERY    . DILATION AND CURETTAGE OF UTERUS    . EYE SURGERY    . LEFT HEART CATH AND CORONARY ANGIOGRAPHY Left 01/14/2018   Procedure: LEFT HEART CATH AND CORONARY ANGIOGRAPHY;  Surgeon: Dionisio David, MD;  Location: [place];  Service: Cardiovascular;  Laterality: Left;  Marland Kitchen MASTECTOMY  2012   Hx: of right breast, DCIS   . ORIF HUMERUS FRACTURE Left 02/23/2014   Procedure: OPEN  REDUCTION INTERNAL FIXATION (ORIF) PROXIMAL HUMERUS FRACTURE;  Surgeon: Johnny Bridge, MD;  Location: [place];  Service: Orthopedics;  Laterality: Left;  . TUBAL LIGATION      Prior to Admission medications   Medication Sig Start Date End Date Taking? Authorizing Provider  acetaminophen (TYLENOL) 500 MG tablet Take 1,000 mg by mouth every 6 (six) hours as needed for moderate pain or headache.    [provider]  atorvastatin (LIPITOR) 40 MG tablet Take 40 mg by mouth at bedtime.    [provider]  Cholecalciferol (VITAMIN D) 2000 units CAPS Take 2,000 Units by mouth daily.     [provider]  clopidogrel (PLAVIX) 75 MG tablet Take 75 mg by mouth daily with breakfast.    [provider]  cyclobenzaprine (FLEXERIL) 10 MG tablet Take 1 tablet (10 mg total) by mouth 2 (two) times daily. Patient taking differently: Take 10 mg by mouth daily.  07/17/15   Molli Barrows, MD  dicyclomine (BENTYL) 10 MG capsule Take 1 capsule (10 mg total) by mouth 3 (three) times daily with meals as needed for spasms. 04/25/18   Arta Silence, MD  diphenoxylate-atropine (LOMOTIL) 2.5-0.025 MG tablet Take 1 tablet by mouth 4 (four) times daily as needed for diarrhea or loose stools.  11/24/17   [provider]  enalapril (VASOTEC) 5 MG tablet Take 5 mg by mouth daily.    [provider]  gabapentin (NEURONTIN) 600 MG tablet Take 1 tablet (600 mg total) by mouth 3 (three) times daily. 07/17/15   Molli Barrows, MD  insulin aspart (NOVOLOG) 100 UNIT/ML injection Inject 20-60 Units into the skin 3 (three) times daily before meals. Per sliding scale    [provider]  insulin glargine (LANTUS) 100 UNIT/ML injection Inject 60 Units into the skin 2 (two) times daily.    [provider]  metoCLOPramide (REGLAN) 10 MG tablet Take 10 mg by mouth every evening.     [provider]  ondansetron (ZOFRAN) 4 MG tablet Take 4 mg by mouth as needed.  01/07/18   [provider]  spironolactone (ALDACTONE) 25 MG tablet Take 25 mg by mouth as needed. 01/07/18   [provider]  traMADol (ULTRAM) 50 MG tablet Take 2 tablets (100 mg total) by mouth every 8 (eight) hours. 03/03/18   Molli Barrows, MD    Allergies Cymbalta [duloxetine hcl] and Toradol [ketorolac tromethamine]  Family History  Problem Relation Age of Onset  . Diabetes Mother   . Hypertension Mother   . Stroke Mother   . Diabetes Brother   . Breast cancer Neg Hx     Social History Social History   Tobacco Use  . Smoking status: Never Smoker  . Smokeless tobacco: Never Used  Substance Use Topics  . Alcohol use: No    Alcohol/week: 0.0 oz  . Drug use: No    Review of Systems  Constitutional: No fever/chills Eyes: No visual changes. ENT: No sore throat. Cardiovascular: Denies chest pain. Respiratory: Denies shortness of breath. Gastrointestinal: No nausea, no vomiting.  No diarrhea.  Genitourinary: Negative for dysuria.  Musculoskeletal: Negative for back pain. Skin: Negative for rash. Neurological: Negative for headaches, focal weakness or numbness.   ____________________________________________   PHYSICAL EXAM:  VITAL SIGNS: ED Triage Vitals  Enc Vitals Group     BP 04/25/18 1523 122/76     Pulse Rate 04/25/18 1523 81     Resp 04/25/18 1523 18     Temp 04/25/18 1523 98.1 F (36.7 C)     Temp Source 04/25/18 1523 Oral     SpO2 04/25/18 1523 98 %     Weight 04/25/18 1522 203 lb (92.1 kg)     Height 04/25/18 1522 5\' 9"  (1.753 m)     Head Circumference --      Peak Flow --      Pain Score 04/25/18 1522 6     Pain Loc --      Pain Edu? --      Excl. in [place]? --     Constitutional: Alert and oriented.  Relatively comfortable appearing and in no acute distress. Eyes: Conjunctivae are normal.  No scleral icterus. Head: Atraumatic. Nose: No congestion/rhinnorhea. Mouth/Throat: Mucous membranes are dry.   Neck: Normal range of  motion.  Cardiovascular: Normal rate, regular rhythm. Grossly normal heart sounds.  Good peripheral circulation. Respiratory: Normal respiratory effort.  No retractions. Lungs CTAB. Gastrointestinal: Soft with mild right upper quadrant tenderness. No distention.  Genitourinary: No flank tenderness. Musculoskeletal: Extremities warm and well perfused.  Neurologic:  Normal speech and language. No gross focal neurologic deficits are appreciated.  Skin:  Skin is warm and dry. No rash noted. Psychiatric: Mood and affect are normal. Speech and behavior are normal.  ____________________________________________   LABS (all labs ordered are listed, but only abnormal results are displayed)  Labs Reviewed  COMPREHENSIVE METABOLIC PANEL - Abnormal; Notable for the  following components:      Result Value   Sodium 133 (*)    Glucose, Bld 220 (*)    BUN 24 (*)    Creatinine, Ser 1.25 (*)    Total Protein 8.3 (*)    AST 42 (*)    GFR calc non Af Amer 43 (*)    GFR calc Af Amer 50 (*)    All other components within normal limits  CBC WITH DIFFERENTIAL/PLATELET - Abnormal; Notable for the following components:   RDW 17.7 (*)    All other components within normal limits  URINALYSIS, COMPLETE (UACMP) WITH MICROSCOPIC - Abnormal; Notable for the following components:   Color, Urine YELLOW (*)    APPearance CLEAR (*)    Glucose, UA 50 (*)    All other components within normal limits  LIPASE, BLOOD   ____________________________________________  EKG  ED ECG REPORT I, Arta Silence, the attending physician, personally viewed and interpreted this ECG.  Date: 04/25/2018 EKG Time: 1530 Rate: 87 Rhythm: normal sinus rhythm QRS Axis: normal Intervals: RBBB, L FB ST/T Wave abnormalities: Nonspecific anterolateral abnormalities Narrative Interpretation: no evidence of acute ischemia  ____________________________________________  RADIOLOGY  CT abdomen: No acute findings to explain the  patient's pain.  Findings of possible cirrhosis, and right ovarian cyst.  ____________________________________________   PROCEDURES  Procedure(s) performed: No  Procedures  Critical Care performed: No ____________________________________________   INITIAL IMPRESSION / ASSESSMENT AND PLAN / ED COURSE  Pertinent labs & imaging results that were available during my care of the patient were reviewed by me and considered in my medical decision making (see chart for details).  66 year old female presents with right upper quadrant discomfort which has been somewhat chronic but worse in the last several days, with no significant associated nausea, vomiting, fever, or other acute symptoms.  On exam, the patient is relatively well-appearing, vital signs are normal, and the remainder the exam is as described above.  I reviewed the past medical records in [place].  Patient has history of colectomy, cholecystectomy, appendectomy, and several other surgeries.  Differential includes pancreatitis, enteritis, gastritis, gas, UTI, or less likely SBO or other postsurgical complication.  Plan: Labs, CT abdomen, and reassess.   ----------------------------------------- 6:27 PM on 04/25/2018 -----------------------------------------  Patient's lab work-up and CT show no significant acute findings.  CT shows possible cirrhosis, however patient's LFTs are normal, and she has no jaundice or acute symptoms of liver disease.  She states she has known about the right ovarian cyst for many years.  The patient feels well and would like to go home.  I prescribed Bentyl as the patient describes the discomfort is somewhat crampy and squeezing, and may be related to intestinal spasms.  I gave her thorough return precautions, and she expressed understanding.  ____________________________________________   FINAL CLINICAL IMPRESSION(S) / ED DIAGNOSES  Final diagnoses:  Right upper quadrant abdominal pain       NEW MEDICATIONS STARTED DURING THIS VISIT:  New Prescriptions   DICYCLOMINE (BENTYL) 10 MG CAPSULE    Take 1 capsule (10 mg total) by mouth 3 (three) times daily with meals as needed for spasms.     Note:  This document was prepared using Dragon voice recognition software and may include unintentional dictation errors.    Arta Silence, MD 04/25/18 [phone]

## 2018-04-25 NOTE — ED Notes (Signed)
Pt complains of lower right quadrant pain that has been persisting for a long period of time. Pt states that the pain has gotten worse recently and is wanting to have x-rays done in order to figure out why she is feeling a constant pressure on the right abdomen.

## 2018-04-25 NOTE — ED Triage Notes (Signed)
Here for upper abdominal pain that is worse 2 days ago.  Has always had RUQ pain for years but never had looked at.  It now radiates to LUQ. Is worse after eating.  Nauseated.

## 2018-04-25 NOTE — Discharge Instructions (Addendum)
Return to the ER for new, worsening, or persistent discomfort or pain, swelling, vomiting, fevers, weakness, or any other new or worsening symptoms that concern you.  You can take the Bentyl as needed for the spasm type pain.  If it does not help, you do not need to continue it.  Follow-up with your regular doctor.

## 2018-06-07 ENCOUNTER — Encounter: Payer: Self-pay | Admitting: Anesthesiology

## 2018-06-07 ENCOUNTER — Ambulatory Visit: Payer: Medicare Other | Attending: Anesthesiology | Admitting: Anesthesiology

## 2018-06-07 ENCOUNTER — Other Ambulatory Visit: Payer: Self-pay

## 2018-06-07 VITALS — BP 137/62 | HR 75 | Temp 98.0°F | Resp 18 | Ht 69.0 in | Wt 200.0 lb

## 2018-06-07 DIAGNOSIS — M79662 Pain in left lower leg: Secondary | ICD-10-CM | POA: Diagnosis not present

## 2018-06-07 DIAGNOSIS — M79661 Pain in right lower leg: Secondary | ICD-10-CM | POA: Insufficient documentation

## 2018-06-07 DIAGNOSIS — M792 Neuralgia and neuritis, unspecified: Secondary | ICD-10-CM | POA: Diagnosis not present

## 2018-06-07 DIAGNOSIS — Z794 Long term (current) use of insulin: Secondary | ICD-10-CM | POA: Insufficient documentation

## 2018-06-07 DIAGNOSIS — M79643 Pain in unspecified hand: Secondary | ICD-10-CM | POA: Insufficient documentation

## 2018-06-07 DIAGNOSIS — Z7901 Long term (current) use of anticoagulants: Secondary | ICD-10-CM | POA: Diagnosis not present

## 2018-06-07 DIAGNOSIS — Z79899 Other long term (current) drug therapy: Secondary | ICD-10-CM | POA: Diagnosis not present

## 2018-06-07 MED ORDER — TRAMADOL HCL 50 MG PO TABS: 100.0000 mg | ORAL_TABLET | Freq: Three times a day (TID) | ORAL | 0 refills | Status: DC

## 2018-06-07 NOTE — Patient Instructions (Signed)
Prescription given for tramadol 

## 2018-06-07 NOTE — Progress Notes (Signed)
Nursing Pain Medication Assessment:  Safety precautions to be maintained throughout the outpatient stay will include: orient to surroundings, keep bed in low position, maintain call bell within reach at all times, provide assistance with transfer out of bed and ambulation.  Medication Inspection Compliance: Pill count conducted under aseptic conditions, in front of the patient. Neither the pills nor the bottle was removed from the patient's sight at any time. Once count was completed pills were immediately returned to the patient in their original bottle.  Medication: Tramadol (Ultram) Pill/Patch Count: 36 of 540 pills remain Pill/Patch Appearance: Markings consistent with prescribed medication Bottle Appearance: Standard pharmacy container. Clearly labeled. Filled Date: 03 / 12 / 2019 Last Medication intake:  Today

## 2018-06-08 NOTE — Progress Notes (Signed)
Subjective:  Patient ID: [name], female    DOB: [birth year]  Age: 67 y.o. MRN: [id number]  CC: Leg Pain (bilateral)   Procedure: None  HPI [name] presents for evaluation.  She was last seen 3 months ago and is been doing well with her regimen.  The quality characteristic distribution of her lower legs pain is stable in nature.  She has some generalized weakness which is long-standing and chronic in nature.  No changes been noted recently.  Her bowel and bladder function been stable as well she is tolerating her tramadol well and no problems are noted.  I reviewed her narcotic assessment sheet and this seems to be working well for her.  Otherwise she is in her usual state of health.  Outpatient Medications Prior to Visit  Medication Sig Dispense Refill  . acetaminophen (TYLENOL) 500 MG tablet Take 1,000 mg by mouth every 6 (six) hours as needed for moderate pain or headache.    Marland Kitchen atorvastatin (LIPITOR) 40 MG tablet Take 40 mg by mouth at bedtime.    . Cholecalciferol (VITAMIN D) 2000 units CAPS Take 2,000 Units by mouth daily.     . clopidogrel (PLAVIX) 75 MG tablet Take 75 mg by mouth daily with breakfast.    . cyclobenzaprine (FLEXERIL) 10 MG tablet Take 1 tablet (10 mg total) by mouth 2 (two) times daily. (Patient taking differently: Take 10 mg by mouth daily. ) 60 tablet 5  . diphenoxylate-atropine (LOMOTIL) 2.5-0.025 MG tablet Take 1 tablet by mouth 4 (four) times daily as needed for diarrhea or loose stools.     . gabapentin (NEURONTIN) 600 MG tablet Take 1 tablet (600 mg total) by mouth 3 (three) times daily. 90 tablet 5  . insulin aspart (NOVOLOG) 100 UNIT/ML injection Inject 20-60 Units into the skin 3 (three) times daily before meals. Per sliding scale    . insulin glargine (LANTUS) 100 UNIT/ML injection Inject 60 Units into the skin 2 (two) times daily.    . metoCLOPramide (REGLAN) 10 MG tablet Take 10 mg by mouth every evening.     . ondansetron  (ZOFRAN) 4 MG tablet Take 4 mg by mouth as needed.    . sacubitril-valsartan (ENTRESTO) 24-26 MG Take by mouth.    . traMADol (ULTRAM) 50 MG tablet Take 2 tablets (100 mg total) by mouth every 8 (eight) hours. 540 tablet 0  . dicyclomine (BENTYL) 10 MG capsule Take 1 capsule (10 mg total) by mouth 3 (three) times daily with meals as needed for spasms. (Patient not taking: Reported on 06/07/2018) 15 capsule 0  . enalapril (VASOTEC) 5 MG tablet Take 5 mg by mouth daily.    Marland Kitchen spironolactone (ALDACTONE) 25 MG tablet Take 25 mg by mouth as needed.     No facility-administered medications prior to visit.     Review of Systems CNS: No confusion or sedation Cardiac: No angina or palpitations GI: No abdominal pain or constipation Constitutional: No nausea vomiting fevers or chills  Objective:  BP 137/62   Pulse 75   Temp 98 F (36.7 C)   Resp 18   Ht 5\' 9"  (1.753 m)   Wt 200 lb (90.7 kg)   SpO2 98%   BMI 29.53 kg/m    BP Readings from Last 3 Encounters:  06/07/18 137/62  04/25/18 121/62  03/03/18 (!) 111/52     Wt Readings from Last 3 Encounters:  06/07/18 200 lb (90.7 kg)  04/25/18 203 lb (92.1 kg)  03/03/18  203 lb (92.1 kg)     Physical Exam Pt is alert and oriented PERRL EOMI HEART IS RRR no murmur or rub LCTA no wheezing or rales MUSCULOSKELETAL reveals some persistent allodynia to the lower extremities.  No cutaneous changes are noted no swelling noted and otherwise her exam is at baseline.  Labs  No results found for: HGBA1C Lab Results  Component Value Date   CREATININE 1.25 (H) 04/25/2018    -------------------------------------------------------------------------------------------------------------------- Lab Results  Component Value Date   WBC 7.7 04/25/2018   HGB 12.4 04/25/2018   HCT 37.4 04/25/2018   PLT 297 04/25/2018   GLUCOSE 220 (H) 04/25/2018   ALT 24 04/25/2018   AST 42 (H) 04/25/2018   NA 133 (L) 04/25/2018   K 5.1 04/25/2018   CL 101  04/25/2018   CREATININE 1.25 (H) 04/25/2018   BUN 24 (H) 04/25/2018   CO2 26 04/25/2018    --------------------------------------------------------------------------------------------------------------------- Ct Abdomen Pelvis W Contrast  Result Date: 04/25/2018 CLINICAL DATA:  Generalized acute abdominal pain EXAM: CT ABDOMEN AND PELVIS WITH CONTRAST TECHNIQUE: Multidetector CT imaging of the abdomen and pelvis was performed using the standard protocol following bolus administration of intravenous contrast. CONTRAST:  4mL OMNIPAQUE IOHEXOL 300 MG/ML  SOLN COMPARISON:  None. FINDINGS: Lower chest: Cardiomegaly with remote left ventricular infarct and apical thinning. Partially covered aortic valve calcification. Hepatobiliary: Lobulated appearance of the liver with large caudate lobe and fissures. No evidence of mass lesion.Cholecystectomy which may account for prominence of the common bile duct. Pancreas: Generalized atrophy with fatty infiltration. No acute finding. Spleen: Unremarkable. Adrenals/Urinary Tract: Negative adrenals. Bilateral renal atrophy. No hydronephrosis or asymmetric enhancement. Unremarkable bladder. Stomach/Bowel: Subtotal colectomy. No obstruction or inflammation seen. No appendicitis. Vascular/Lymphatic: Diffuse atherosclerotic calcification. Extensive atherosclerotic plaque on the visceral branches without definite acute finding. No mass or adenopathy. Reproductive:4.7 x 6.7 cm right ovarian cyst. The elongated cyst has a simple CT appearance but is nonspecific given patient's age. Other: No ascites or pneumoperitoneum. Musculoskeletal: No acute finding. Osteopenia and spondylosis. Multilevel lower thoracic ankylosis from bridging osteophytes. IMPRESSION: 1. No acute finding. 2. Nearly 7 cm right ovarian cyst. Recommend nonemergent pelvic ultrasound for characterization. 3. Probable cirrhosis.  Recommend correlation with risk factors. 4. Subtotal colectomy. 5. Cardiomegaly with  remote transmural left ventricular apex infarct. Electronically Signed   By: Monte Fantasia M.D.   On: 04/25/2018 17:45     Assessment & Plan:   Skylarr was seen today for leg pain.  Diagnoses and all orders for this visit:  Neuropathic pain of foot, right  Neuropathic pain of foot, left  Neuropathic pain of hand, unspecified laterality  Other orders -     traMADol (ULTRAM) 50 MG tablet; Take 2 tablets (100 mg total) by mouth every 8 (eight) hours.        ----------------------------------------------------------------------------------------------------------------------  Problem List Items Addressed This Visit    None    Visit Diagnoses    Neuropathic pain of foot, right    -  Primary   Neuropathic pain of foot, left       Neuropathic pain of hand, unspecified laterality            ----------------------------------------------------------------------------------------------------------------------  1. Neuropathic pain of foot, right We will continue the tramadol.  She is taking 2 tablets 3 times a day and this is working well for her.  We have reviewed the [facility] practitioner database information and it is appropriate.  Refills will be given for the next 2 months with return to  clinic in 3 months.  2. Neuropathic pain of foot, left Continue follow-up with her primary care physicians as well  3. Neuropathic pain of hand, unspecified laterality As above    ----------------------------------------------------------------------------------------------------------------------  I am having Kyri L. Bohl maintain her clopidogrel, atorvastatin, metoCLOPramide, insulin aspart, Vitamin D, cyclobenzaprine, gabapentin, enalapril, insulin glargine, diphenoxylate-atropine, acetaminophen, ondansetron, spironolactone, dicyclomine, sacubitril-valsartan, and traMADol.   Meds ordered this encounter  Medications  . traMADol (ULTRAM) 50 MG tablet    Sig:  Take 2 tablets (100 mg total) by mouth every 8 (eight) hours.    Dispense:  540 tablet    Refill:  0    Do not fill until [id number]   This is a three month supply   Patient's Medications  New Prescriptions   No medications on file  Previous Medications   ACETAMINOPHEN (TYLENOL) 500 MG TABLET    Take 1,000 mg by mouth every 6 (six) hours as needed for moderate pain or headache.   ATORVASTATIN (LIPITOR) 40 MG TABLET    Take 40 mg by mouth at bedtime.   CHOLECALCIFEROL (VITAMIN D) 2000 UNITS CAPS    Take 2,000 Units by mouth daily.    CLOPIDOGREL (PLAVIX) 75 MG TABLET    Take 75 mg by mouth daily with breakfast.   CYCLOBENZAPRINE (FLEXERIL) 10 MG TABLET    Take 1 tablet (10 mg total) by mouth 2 (two) times daily.   DICYCLOMINE (BENTYL) 10 MG CAPSULE    Take 1 capsule (10 mg total) by mouth 3 (three) times daily with meals as needed for spasms.   DIPHENOXYLATE-ATROPINE (LOMOTIL) 2.5-0.025 MG TABLET    Take 1 tablet by mouth 4 (four) times daily as needed for diarrhea or loose stools.    ENALAPRIL (VASOTEC) 5 MG TABLET    Take 5 mg by mouth daily.   GABAPENTIN (NEURONTIN) 600 MG TABLET    Take 1 tablet (600 mg total) by mouth 3 (three) times daily.   INSULIN ASPART (NOVOLOG) 100 UNIT/ML INJECTION    Inject 20-60 Units into the skin 3 (three) times daily before meals. Per sliding scale   INSULIN GLARGINE (LANTUS) 100 UNIT/ML INJECTION    Inject 60 Units into the skin 2 (two) times daily.   METOCLOPRAMIDE (REGLAN) 10 MG TABLET    Take 10 mg by mouth every evening.    ONDANSETRON (ZOFRAN) 4 MG TABLET    Take 4 mg by mouth as needed.   SACUBITRIL-VALSARTAN (ENTRESTO) 24-26 MG    Take by mouth.   SPIRONOLACTONE (ALDACTONE) 25 MG TABLET    Take 25 mg by mouth as needed.  Modified Medications   Modified Medication Previous Medication   TRAMADOL (ULTRAM) 50 MG TABLET traMADol (ULTRAM) 50 MG tablet      Take 2 tablets (100 mg total) by mouth every 8 (eight) hours.    Take 2 tablets (100 mg total) by  mouth every 8 (eight) hours.  Discontinued Medications   No medications on file   ----------------------------------------------------------------------------------------------------------------------  Follow-up: Return in about 3 months (around 09/07/2018) for evaluation, med refill.    Molli Barrows, MD

## 2018-06-22 ENCOUNTER — Other Ambulatory Visit: Payer: Self-pay

## 2018-06-22 DIAGNOSIS — Z1231 Encounter for screening mammogram for malignant neoplasm of breast: Secondary | ICD-10-CM

## 2018-08-11 ENCOUNTER — Ambulatory Visit
Admission: RE | Admit: 2018-08-11 | Discharge: 2018-08-11 | Disposition: A | Discharge status: Home / Self Care | Payer: Medicare Other | Source: Ambulatory Visit | Attending: General Surgery | Admitting: General Surgery

## 2018-08-11 DIAGNOSIS — Z1231 Encounter for screening mammogram for malignant neoplasm of breast: Secondary | ICD-10-CM | POA: Diagnosis present

## 2018-08-16 ENCOUNTER — Ambulatory Visit: Payer: Self-pay | Admitting: Urology

## 2018-08-17 NOTE — Progress Notes (Signed)
Patient rescheduled

## 2018-08-18 ENCOUNTER — Encounter: Payer: Self-pay | Admitting: General Surgery

## 2018-08-18 ENCOUNTER — Encounter: Payer: Self-pay | Admitting: Urology

## 2018-08-18 ENCOUNTER — Ambulatory Visit (INDEPENDENT_AMBULATORY_CARE_PROVIDER_SITE_OTHER): Payer: Medicare Other | Admitting: Urology

## 2018-08-18 ENCOUNTER — Ambulatory Visit (INDEPENDENT_AMBULATORY_CARE_PROVIDER_SITE_OTHER): Payer: Medicare Other | Admitting: General Surgery

## 2018-08-18 VITALS — BP 75/44 | HR 71 | Ht 69.0 in | Wt 200.0 lb

## 2018-08-18 VITALS — BP 130/68 | HR 70 | Resp 14 | Ht 69.0 in | Wt 200.0 lb

## 2018-08-18 DIAGNOSIS — Z86000 Personal history of in-situ neoplasm of breast: Secondary | ICD-10-CM

## 2018-08-18 DIAGNOSIS — Z1231 Encounter for screening mammogram for malignant neoplasm of breast: Secondary | ICD-10-CM | POA: Diagnosis not present

## 2018-08-18 DIAGNOSIS — I200 Unstable angina: Secondary | ICD-10-CM | POA: Diagnosis not present

## 2018-08-18 DIAGNOSIS — N952 Postmenopausal atrophic vaginitis: Secondary | ICD-10-CM | POA: Diagnosis not present

## 2018-08-18 DIAGNOSIS — N83201 Unspecified ovarian cyst, right side: Secondary | ICD-10-CM

## 2018-08-18 DIAGNOSIS — N390 Urinary tract infection, site not specified: Secondary | ICD-10-CM

## 2018-08-18 LAB — BLADDER SCAN AMB NON-IMAGING

## 2018-08-18 NOTE — Progress Notes (Signed)
Patient ID: [name], female   DOB: 1951/07/21, 67 y.o.   MRN: [id number]  Chief Complaint  Patient presents with  . Follow-up    HPI [name] is a 67 y.o. female who presents for a breast evaluation. The most recent left breast screening mammogram was done on 08/11/2018.  Patient does perform regular self breast checks and gets regular mammograms done.    HPI  Past Medical History:  Diagnosis Date  . 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 CHF (congestive heart failure) ([place])   . Complication of anesthesia   . Coronary artery disease   . Diabetes mellitus without complication ([place])   . Fracture of humerus, proximal, left, closed 02/23/2014  . Myocardial infarct ([place])   . Neuropathic pain   . Panic attacks    Hx: of  . PONV (postoperative nausea and vomiting)     Past Surgical History:  Procedure Laterality Date  . APPENDECTOMY    . BREAST BIOPSY Left 02/12/15   benign, clip placed  . BREAST SURGERY Right 2012   Right mastectomy with SLN, DCIS, grade 1-2. ER/PR not performed.   Marland Kitchen CARDIAC CATHETERIZATION    . cardiac stents  2007  . CHOLECYSTECTOMY    . COLON SURGERY    . DILATION AND CURETTAGE OF UTERUS    . EYE SURGERY    . LEFT HEART CATH AND CORONARY ANGIOGRAPHY Left 01/14/2018   Procedure: LEFT HEART CATH AND CORONARY ANGIOGRAPHY;  Surgeon: Dionisio David, MD;  Location: [place] [facility];  Service: Cardiovascular;  Laterality: Left;  Marland Kitchen MASTECTOMY Right 2012   Hx: of right breast, DCIS   . ORIF HUMERUS FRACTURE Left 02/23/2014   Procedure: OPEN REDUCTION INTERNAL FIXATION (ORIF) PROXIMAL HUMERUS FRACTURE;  Surgeon: Johnny Bridge, MD;  Location: [place];  Service: Orthopedics;  Laterality: Left;  . TUBAL LIGATION      Family History  Problem Relation Age of Onset  . Diabetes Mother   . Hypertension Mother   . Stroke Mother   . Diabetes Brother   . Breast cancer Neg Hx     Social History Social History   Tobacco Use  . Smoking status:  Never Smoker  . Smokeless tobacco: Never Used  Substance Use Topics  . Alcohol use: No    Alcohol/week: 0.0 standard drinks  . Drug use: No    Allergies  Allergen Reactions  . Cymbalta [Duloxetine Hcl] Other (See Comments)    Vomiting and stiff muscles in legs  . Toradol [Ketorolac Tromethamine] Hives    Current Outpatient Medications  Medication Sig Dispense Refill  . acetaminophen (TYLENOL) 500 MG tablet Take 1,000 mg by mouth every 6 (six) hours as needed for moderate pain or headache.    Marland Kitchen atorvastatin (LIPITOR) 40 MG tablet Take 40 mg by mouth at bedtime.    . Cholecalciferol (VITAMIN D) 2000 units CAPS Take 2,000 Units by mouth daily.     . clopidogrel (PLAVIX) 75 MG tablet Take 75 mg by mouth daily with breakfast.    . cyclobenzaprine (FLEXERIL) 10 MG tablet Take 1 tablet (10 mg total) by mouth 2 (two) times daily. (Patient taking differently: Take 10 mg by mouth daily. ) 60 tablet 5  . dicyclomine (BENTYL) 10 MG capsule Take 1 capsule (10 mg total) by mouth 3 (three) times daily with meals as needed for spasms. 15 capsule 0  . diphenoxylate-atropine (LOMOTIL) 2.5-0.025 MG tablet Take 1 tablet by mouth 4 (four) times  daily as needed for diarrhea or loose stools.     . enalapril (VASOTEC) 5 MG tablet Take 5 mg by mouth daily.    Marland Kitchen gabapentin (NEURONTIN) 600 MG tablet Take 1 tablet (600 mg total) by mouth 3 (three) times daily. 90 tablet 5  . insulin aspart (NOVOLOG) 100 UNIT/ML injection Inject 20-60 Units into the skin 3 (three) times daily before meals. Per sliding scale    . insulin glargine (LANTUS) 100 UNIT/ML injection Inject 60 Units into the skin 2 (two) times daily.    . metoCLOPramide (REGLAN) 10 MG tablet Take 10 mg by mouth every evening.     . ondansetron (ZOFRAN) 4 MG tablet Take 4 mg by mouth as needed.    . sacubitril-valsartan (ENTRESTO) 24-26 MG Take by mouth.    . spironolactone (ALDACTONE) 25 MG tablet Take 25 mg by mouth as needed.    . traMADol (ULTRAM) 50  MG tablet Take 2 tablets (100 mg total) by mouth every 8 (eight) hours. 540 tablet 0   No current facility-administered medications for this visit.     Review of Systems Review of Systems  Constitutional: Negative.   Respiratory: Negative.   Cardiovascular: Negative.     Blood pressure 130/68, pulse 70, resp. rate 14, height 5\' 9"  (1.753 m), weight 200 lb (90.7 kg).  Physical Exam Physical Exam  Constitutional: She is oriented to person, place, and time. She appears well-developed and well-nourished.  Eyes: Conjunctivae are normal. No scleral icterus.  Neck: Neck supple.  Cardiovascular: Normal rate, regular rhythm and normal heart sounds.  Pulmonary/Chest: Effort normal and breath sounds normal. Left breast exhibits no inverted nipple, no mass, no nipple discharge, no skin change and no tenderness.  Right mastectomy site is clean and well healed.     Lymphadenopathy:    She has no cervical adenopathy.    She has no axillary adenopathy.  Neurological: She is alert and oriented to person, place, and time.  Skin: Skin is warm and dry.    Data Reviewed Left breast screening mammogram dated August 11, 2018 was reviewed.  BI-RADS-1.  Assessment    No evidence of recurrent cancer.    Plan  Patient will be asked to return to the office in one year with a left screening mammogram.The patient is aware to call back for any questions or concerns.  Patient to see her  OBGYN .   HPI, Physical Exam, Assessment and Plan have been scribed under the direction and in the presence of Hervey Ard, MD.  Gaspar Cola, CMA  I have completed the exam and reviewed the above documentation for accuracy and completeness.  I agree with the above.  Haematologist has been used and any errors in dictation or transcription are unintentional.  Hervey Ard, M.D., F.A.C.S.  Forest Gleason Paulmichael Schreck 08/19/2018, 5:02 PM

## 2018-08-18 NOTE — Patient Instructions (Signed)
Patient will be asked to return to the office in one year with al eft screening mammogram. The patient is aware to call back for any questions or concerns. 

## 2018-08-19 ENCOUNTER — Encounter: Payer: Self-pay | Admitting: General Surgery

## 2018-08-30 ENCOUNTER — Other Ambulatory Visit: Payer: Self-pay

## 2018-08-30 ENCOUNTER — Ambulatory Visit: Payer: Medicare Other | Attending: Anesthesiology | Admitting: Anesthesiology

## 2018-08-30 ENCOUNTER — Encounter: Payer: Self-pay | Admitting: Anesthesiology

## 2018-08-30 VITALS — BP 148/61 | HR 76 | Temp 97.8°F | Resp 18 | Ht 69.0 in | Wt 200.0 lb

## 2018-08-30 DIAGNOSIS — M79604 Pain in right leg: Secondary | ICD-10-CM | POA: Diagnosis present

## 2018-08-30 DIAGNOSIS — R531 Weakness: Secondary | ICD-10-CM | POA: Diagnosis not present

## 2018-08-30 DIAGNOSIS — Z7902 Long term (current) use of antithrombotics/antiplatelets: Secondary | ICD-10-CM | POA: Diagnosis not present

## 2018-08-30 DIAGNOSIS — M792 Neuralgia and neuritis, unspecified: Secondary | ICD-10-CM

## 2018-08-30 DIAGNOSIS — M79671 Pain in right foot: Secondary | ICD-10-CM | POA: Diagnosis not present

## 2018-08-30 DIAGNOSIS — M79672 Pain in left foot: Secondary | ICD-10-CM | POA: Insufficient documentation

## 2018-08-30 DIAGNOSIS — G629 Polyneuropathy, unspecified: Secondary | ICD-10-CM | POA: Insufficient documentation

## 2018-08-30 DIAGNOSIS — Z79899 Other long term (current) drug therapy: Secondary | ICD-10-CM | POA: Insufficient documentation

## 2018-08-30 DIAGNOSIS — M79605 Pain in left leg: Secondary | ICD-10-CM | POA: Insufficient documentation

## 2018-08-30 DIAGNOSIS — Z794 Long term (current) use of insulin: Secondary | ICD-10-CM | POA: Insufficient documentation

## 2018-08-30 DIAGNOSIS — M545 Low back pain: Secondary | ICD-10-CM | POA: Insufficient documentation

## 2018-08-30 MED ORDER — TRAMADOL HCL 50 MG PO TABS: 100.0000 mg | ORAL_TABLET | Freq: Three times a day (TID) | ORAL | 0 refills | Status: DC

## 2018-08-30 NOTE — Progress Notes (Signed)
Nursing Pain Medication Assessment:  Safety precautions to be maintained throughout the outpatient stay will include: orient to surroundings, keep bed in low position, maintain call bell within reach at all times, provide assistance with transfer out of bed and ambulation.  Medication Inspection Compliance: Pill count conducted under aseptic conditions, in front of the patient. Neither the pills nor the bottle was removed from the patient's sight at any time. Once count was completed pills were immediately returned to the patient in their original bottle.  Medication: Tramadol (Ultram) Pill/Patch Count: 14 of 180 pills remain Pill/Patch Appearance: Markings consistent with prescribed medication Bottle Appearance: Standard pharmacy container. Clearly labeled. Filled Date: 08 / 06 / 2019 Last Medication intake:  Today

## 2018-08-30 NOTE — Patient Instructions (Signed)
You have been given a script for Tramadol today.

## 2018-08-30 NOTE — Progress Notes (Signed)
Subjective:  Patient ID: [name], female    DOB: 1951/07/01  Age: 67 y.o. MRN: [id number]  CC: Leg Pain (bilateral) and Foot Pain (bilateral)   Procedure: None  HPI [name] presents for return evaluation.  She was last seen 3 months ago and continues to have low back pain with bilateral lower extremity pain.  She has long-standing neuropathy and this is been relatively stable and severity.  She takes tramadol approximately 2 tablets 3 times a day on average in addition to her gabapentin and Flexeril.  The combination seems to keep her neuropathic pain under good control.  She has had some chronic gradual development of some lower extremity weakness worse with prolonged walking or standing.  She has chronic sensory loss from her condition.  Otherwise no other changes are reported at this time.  Based on her narcotic assessment sheet she is deriving good functional lifestyle improvement with her medications.  Outpatient Medications Prior to Visit  Medication Sig Dispense Refill  . acetaminophen (TYLENOL) 500 MG tablet Take 1,000 mg by mouth every 6 (six) hours as needed for moderate pain or headache.    Marland Kitchen atorvastatin (LIPITOR) 40 MG tablet Take 40 mg by mouth at bedtime.    . Cholecalciferol (VITAMIN D) 2000 units CAPS Take 2,000 Units by mouth daily.     . clopidogrel (PLAVIX) 75 MG tablet Take 75 mg by mouth daily with breakfast.    . cyclobenzaprine (FLEXERIL) 10 MG tablet Take 1 tablet (10 mg total) by mouth 2 (two) times daily. (Patient taking differently: Take 10 mg by mouth daily. ) 60 tablet 5  . dicyclomine (BENTYL) 10 MG capsule Take 1 capsule (10 mg total) by mouth 3 (three) times daily with meals as needed for spasms. 15 capsule 0  . diphenoxylate-atropine (LOMOTIL) 2.5-0.025 MG tablet Take 1 tablet by mouth 4 (four) times daily as needed for diarrhea or loose stools.     . enalapril (VASOTEC) 5 MG tablet Take 5 mg by mouth daily.    Marland Kitchen gabapentin  (NEURONTIN) 600 MG tablet Take 1 tablet (600 mg total) by mouth 3 (three) times daily. 90 tablet 5  . insulin aspart (NOVOLOG) 100 UNIT/ML injection Inject 20-60 Units into the skin 3 (three) times daily before meals. Per sliding scale    . insulin glargine (LANTUS) 100 UNIT/ML injection Inject 60 Units into the skin 2 (two) times daily.    . metoCLOPramide (REGLAN) 10 MG tablet Take 10 mg by mouth every evening.     . ondansetron (ZOFRAN) 4 MG tablet Take 4 mg by mouth as needed.    . sacubitril-valsartan (ENTRESTO) 24-26 MG Take by mouth.    . spironolactone (ALDACTONE) 25 MG tablet Take 25 mg by mouth as needed.    . traMADol (ULTRAM) 50 MG tablet Take 2 tablets (100 mg total) by mouth every 8 (eight) hours. 540 tablet 0   No facility-administered medications prior to visit.     Review of Systems CNS: No confusion or sedation Cardiac: No angina or palpitations GI: No abdominal pain or constipation Constitutional: No nausea vomiting fevers or chills  Objective:  BP (!) 148/61   Pulse 76   Temp 97.8 F (36.6 C)   Resp 18   Ht 5\' 9"  (1.753 m)   Wt 200 lb (90.7 kg)   SpO2 100%   BMI 29.53 kg/m    BP Readings from Last 3 Encounters:  08/30/18 (!) 148/61  08/18/18 (!) 75/44  08/18/18  130/68     Wt Readings from Last 3 Encounters:  08/30/18 200 lb (90.7 kg)  08/18/18 200 lb (90.7 kg)  08/18/18 200 lb (90.7 kg)     Physical Exam Pt is alert and oriented PERRL EOMI HEART IS RRR no murmur or rub LCTA no wheezing or rales MUSCULOSKELETAL reveals a mildly antalgic gait.  Her muscle tone and bulk to the lower extremities is at baseline from previous evaluation.  Labs  No results found for: HGBA1C Lab Results  Component Value Date   CREATININE 1.25 (H) 04/25/2018    -------------------------------------------------------------------------------------------------------------------- Lab Results  Component Value Date   WBC 7.7 04/25/2018   HGB 12.4 04/25/2018   HCT  37.4 04/25/2018   PLT 297 04/25/2018   GLUCOSE 220 (H) 04/25/2018   ALT 24 04/25/2018   AST 42 (H) 04/25/2018   NA 133 (L) 04/25/2018   K 5.1 04/25/2018   CL 101 04/25/2018   CREATININE 1.25 (H) 04/25/2018   BUN 24 (H) 04/25/2018   CO2 26 04/25/2018    --------------------------------------------------------------------------------------------------------------------- Mm 3d Screen Breast Uni Left  Result Date: 08/11/2018 CLINICAL DATA:  Screening. EXAM: DIGITAL SCREENING UNILATERAL LEFT MAMMOGRAM WITH CAD AND TOMO COMPARISON:  Previous exam(s). ACR Breast Density Category b: There are scattered areas of fibroglandular density. FINDINGS: There are no findings suspicious for malignancy. Images were processed with CAD. IMPRESSION: No mammographic evidence of malignancy. A result letter of this screening mammogram will be mailed directly to the patient. RECOMMENDATION: Screening mammogram in one year. (Code:SM-B-01Y) BI-RADS CATEGORY  1: Negative. Electronically Signed   By: Abelardo Diesel M.D.   On: 08/11/2018 13:08     Assessment & Plan:   [name] was seen today for leg pain and foot pain.  Diagnoses and all orders for this visit:  Neuropathic pain of foot, right  Neuropathic pain of foot, left  Neuropathic pain of hand, unspecified laterality  Other orders -     traMADol (ULTRAM) 50 MG tablet; Take 2 tablets (100 mg total) by mouth every 8 (eight) hours.        ----------------------------------------------------------------------------------------------------------------------  Problem List Items Addressed This Visit    None    Visit Diagnoses    Neuropathic pain of foot, right    -  Primary   Neuropathic pain of foot, left       Neuropathic pain of hand, unspecified laterality            ----------------------------------------------------------------------------------------------------------------------  1. Neuropathic pain of foot, right Her condition is  stable in nature.  The pain that she experiences is under good control with her current narcotic regimen.  She is taking Ultram 2 tablets 3 times a day and we have reviewed her [place] information and it is appropriate.  She continues to derive good functional lifestyle improvement and we will refill her medications for the next 3 months.  My return to clinic in 3 months for reevaluation and she is to continue follow-up with her primary care physicians for her medical care.   2. Neuropathic pain of foot, left   3. Neuropathic pain of hand, unspecified laterality     ----------------------------------------------------------------------------------------------------------------------  I am having [name] maintain her clopidogrel, atorvastatin, metoCLOPramide, insulin aspart, Vitamin D, cyclobenzaprine, gabapentin, enalapril, insulin glargine, diphenoxylate-atropine, acetaminophen, ondansetron, spironolactone, dicyclomine, sacubitril-valsartan, and traMADol.   Meds ordered this encounter  Medications  . traMADol (ULTRAM) 50 MG tablet    Sig: Take 2 tablets (100 mg total) by mouth every 8 (eight)  hours.    Dispense:  540 tablet    Refill:  0    Do not fill until [id number]   This is a three month supply   Patient's Medications  New Prescriptions   No medications on file  Previous Medications   ACETAMINOPHEN (TYLENOL) 500 MG TABLET    Take 1,000 mg by mouth every 6 (six) hours as needed for moderate pain or headache.   ATORVASTATIN (LIPITOR) 40 MG TABLET    Take 40 mg by mouth at bedtime.   CHOLECALCIFEROL (VITAMIN D) 2000 UNITS CAPS    Take 2,000 Units by mouth daily.    CLOPIDOGREL (PLAVIX) 75 MG TABLET    Take 75 mg by mouth daily with breakfast.   CYCLOBENZAPRINE (FLEXERIL) 10 MG TABLET    Take 1 tablet (10 mg total) by mouth 2 (two) times daily.   DICYCLOMINE (BENTYL) 10 MG CAPSULE    Take 1 capsule (10 mg total) by mouth 3 (three) times  daily with meals as needed for spasms.   DIPHENOXYLATE-ATROPINE (LOMOTIL) 2.5-0.025 MG TABLET    Take 1 tablet by mouth 4 (four) times daily as needed for diarrhea or loose stools.    ENALAPRIL (VASOTEC) 5 MG TABLET    Take 5 mg by mouth daily.   GABAPENTIN (NEURONTIN) 600 MG TABLET    Take 1 tablet (600 mg total) by mouth 3 (three) times daily.   INSULIN ASPART (NOVOLOG) 100 UNIT/ML INJECTION    Inject 20-60 Units into the skin 3 (three) times daily before meals. Per sliding scale   INSULIN GLARGINE (LANTUS) 100 UNIT/ML INJECTION    Inject 60 Units into the skin 2 (two) times daily.   METOCLOPRAMIDE (REGLAN) 10 MG TABLET    Take 10 mg by mouth every evening.    ONDANSETRON (ZOFRAN) 4 MG TABLET    Take 4 mg by mouth as needed.   SACUBITRIL-VALSARTAN (ENTRESTO) 24-26 MG    Take by mouth.   SPIRONOLACTONE (ALDACTONE) 25 MG TABLET    Take 25 mg by mouth as needed.  Modified Medications   Modified Medication Previous Medication   TRAMADOL (ULTRAM) 50 MG TABLET traMADol (ULTRAM) 50 MG tablet      Take 2 tablets (100 mg total) by mouth every 8 (eight) hours.    Take 2 tablets (100 mg total) by mouth every 8 (eight) hours.  Discontinued Medications   No medications on file   ----------------------------------------------------------------------------------------------------------------------  Follow-up: Return in about 3 months (around 11/29/2018) for evaluation, med refill.    Molli Barrows, MD

## 2018-09-06 ENCOUNTER — Ambulatory Visit: Payer: Medicare Other | Admitting: Urology

## 2018-09-06 NOTE — Progress Notes (Deleted)
09/06/2018 6:09 AM   [name] 1951/11/03 [id number]  Referring provider: Perrin Maltese, MD [place], [place] [address]  No chief complaint on file.   HPI: Patient is a 67 year old Caucasian female who is referred to [place] by Dr. Perrin Maltese for recurrent urinary tract infections.  Patient states that she has had *** urinary tract infections over the last year.  Reviewing her records,  she has had *** .    Her symptoms with a urinary tract infection consist of ***.  She has baseline nocturia and incontinence.  Patient denies any gross hematuria, dysuria or suprapubic/flank pain.  Patient denies any fevers, chills, nausea or vomiting. ***  She does/does not have a history of nephrolithiasis, GU surgery or GU trauma. ***  She is/is not sexually active.  She has/has not noted a correlation with her urinary tract infections and sexual intercourse.  ***   She does/does not engage in anal sex. ***  She is/ is not having anal to vaginal sex.*** She is/is not voiding before and after sex. ***     She is postmenopausal and a history of breast cancer.    She admits to/denies constipation and/or diarrhea. ***  She does/does not engage in good perineal hygiene. She does/does not take tub baths. ***  She has/does not have incontinence.  She is using incontinence pads. ***  She is having/ not having pain with bladder filling.  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 Bilateral renal atrophy.  No hydronephrosis or asymmetric enhancement. Unremarkable bladder.   She is drinking *** of water daily.    PMH: Past Medical History:  Diagnosis Date  . 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 CHF (congestive heart failure) ([place])   . Complication of anesthesia   . Coronary artery disease   . Diabetes mellitus without complication ([place])   . Fracture of humerus, proximal, left, closed 02/23/2014  . Myocardial infarct ([place])   . Neuropathic pain   . Panic  attacks    Hx: of  . PONV (postoperative nausea and vomiting)     Surgical History: Past Surgical History:  Procedure Laterality Date  . APPENDECTOMY    . BREAST BIOPSY Left 02/12/15   benign, clip placed  . BREAST SURGERY Right 2012   Right mastectomy with SLN, DCIS, grade 1-2. ER/PR not performed.   Marland Kitchen CARDIAC CATHETERIZATION    . cardiac stents  2007  . CHOLECYSTECTOMY    . COLON SURGERY     Subtotal colectomy for colonic inertia.  Marland Kitchen DILATION AND CURETTAGE OF UTERUS    . EYE SURGERY    . LEFT HEART CATH AND CORONARY ANGIOGRAPHY Left 01/14/2018   Procedure: LEFT HEART CATH AND CORONARY ANGIOGRAPHY;  Surgeon: Dionisio David, MD;  Location: [place] [facility];  Service: Cardiovascular;  Laterality: Left;  Marland Kitchen MASTECTOMY Right 2012   Hx: of right breast, DCIS   . ORIF HUMERUS FRACTURE Left 02/23/2014   Procedure: OPEN REDUCTION INTERNAL FIXATION (ORIF) PROXIMAL HUMERUS FRACTURE;  Surgeon: Johnny Bridge, MD;  Location: [place];  Service: Orthopedics;  Laterality: Left;  . TUBAL LIGATION      Home Medications:  Allergies as of 09/06/2018      Reactions   Cymbalta [duloxetine Hcl] Other (See Comments)   Vomiting and stiff muscles in legs   Toradol [ketorolac Tromethamine] Hives      Medication List        Accurate as  of 09/06/18  6:09 AM. Always use your most recent med list.          acetaminophen 500 MG tablet Commonly known as:  TYLENOL Take 1,000 mg by mouth every 6 (six) hours as needed for moderate pain or headache.   atorvastatin 40 MG tablet Commonly known as:  LIPITOR Take 40 mg by mouth at bedtime.   clopidogrel 75 MG tablet Commonly known as:  PLAVIX Take 75 mg by mouth daily with breakfast.   cyclobenzaprine 10 MG tablet Commonly known as:  FLEXERIL Take 1 tablet (10 mg total) by mouth 2 (two) times daily.   dicyclomine 10 MG capsule Commonly known as:  BENTYL Take 1 capsule (10 mg total) by mouth 3 (three) times daily with meals as needed for  spasms.   diphenoxylate-atropine 2.5-0.025 MG tablet Commonly known as:  LOMOTIL Take 1 tablet by mouth 4 (four) times daily as needed for diarrhea or loose stools.   enalapril 5 MG tablet Commonly known as:  VASOTEC Take 5 mg by mouth daily.   ENTRESTO 24-26 MG Generic drug:  sacubitril-valsartan Take by mouth.   gabapentin 600 MG tablet Commonly known as:  NEURONTIN Take 1 tablet (600 mg total) by mouth 3 (three) times daily.   insulin aspart 100 UNIT/ML injection Commonly known as:  novoLOG Inject 20-60 Units into the skin 3 (three) times daily before meals. Per sliding scale   insulin glargine 100 UNIT/ML injection Commonly known as:  LANTUS Inject 60 Units into the skin 2 (two) times daily.   metoCLOPramide 10 MG tablet Commonly known as:  REGLAN Take 10 mg by mouth every evening.   ondansetron 4 MG tablet Commonly known as:  ZOFRAN Take 4 mg by mouth as needed.   spironolactone 25 MG tablet Commonly known as:  ALDACTONE Take 25 mg by mouth as needed.   traMADol 50 MG tablet Commonly known as:  ULTRAM Take 2 tablets (100 mg total) by mouth every 8 (eight) hours.   Vitamin D 2000 units Caps Take 2,000 Units by mouth daily.       Allergies:  Allergies  Allergen Reactions  . Cymbalta [Duloxetine Hcl] Other (See Comments)    Vomiting and stiff muscles in legs  . Toradol [Ketorolac Tromethamine] Hives    Family History: Family History  Problem Relation Age of Onset  . Diabetes Mother   . Hypertension Mother   . Stroke Mother   . Diabetes Brother   . Breast cancer Neg Hx     Social History:  reports that she has never smoked. She has never used smokeless tobacco. She reports that she does not drink alcohol or use drugs.  ROS:                                        Physical Exam: There were no vitals taken for this visit.  Constitutional:  Well nourished. Alert and oriented, No acute distress. HEENT: [place] AT, moist mucus  membranes.  Trachea midline, no masses. Cardiovascular: No clubbing, cyanosis, or edema. Respiratory: Normal respiratory effort, no increased work of breathing. GI: Abdomen is soft, non tender, non distended, no abdominal masses. Liver and spleen not palpable.  No hernias appreciated.  Stool sample for occult testing is not indicated.   GU: No CVA tenderness.  No bladder fullness or masses.  Normal external genitalia, normal pubic hair distribution, no lesions.  Normal  urethral meatus, no lesions, no prolapse, no discharge.   No urethral masses, tenderness and/or tenderness. No bladder fullness, tenderness or masses. Normal vagina mucosa, good estrogen effect, no discharge, no lesions, good pelvic support, no cystocele or rectocele noted.  No cervical motion tenderness.  Uterus is freely mobile and non-fixed.  No adnexal/parametria masses or tenderness noted.  Anus and perineum are without rashes or lesions.   *** Skin: No rashes, bruises or suspicious lesions. Lymph: No cervical or inguinal adenopathy. Neurologic: Grossly intact, no focal deficits, moving all 4 extremities. Psychiatric: Normal mood and affect.  Laboratory Data: Lab Results  Component Value Date   WBC 7.7 04/25/2018   HGB 12.4 04/25/2018   HCT 37.4 04/25/2018   MCV 83.2 04/25/2018   PLT 297 04/25/2018    Lab Results  Component Value Date   CREATININE 1.25 (H) 04/25/2018    No results found for: PSA  No results found for: TESTOSTERONE  No results found for: HGBA1C  No results found for: TSH  No results found for: CHOL, HDL, CHOLHDL, VLDL, LDLCALC  Lab Results  Component Value Date   AST 42 (H) 04/25/2018   Lab Results  Component Value Date   ALT 24 04/25/2018   No components found for: ALKALINEPHOPHATASE No components found for: BILIRUBINTOTAL  No results found for: ESTRADIOL  Urinalysis ***  I have reviewed the labs.   Pertinent Imaging: CLINICAL DATA:  Generalized acute abdominal  pain  EXAM: CT ABDOMEN AND PELVIS WITH CONTRAST  TECHNIQUE: Multidetector CT imaging of the abdomen and pelvis was performed using the standard protocol following bolus administration of intravenous contrast.  CONTRAST:  51m OMNIPAQUE IOHEXOL 300 MG/ML  SOLN  COMPARISON:  None.  FINDINGS: Lower chest: Cardiomegaly with remote left ventricular infarct and apical thinning. Partially covered aortic valve calcification.  Hepatobiliary: Lobulated appearance of the liver with large caudate lobe and fissures. No evidence of mass lesion.Cholecystectomy which may account for prominence of the common bile duct.  Pancreas: Generalized atrophy with fatty infiltration. No acute finding.  Spleen: Unremarkable.  Adrenals/Urinary Tract: Negative adrenals. Bilateral renal atrophy. No hydronephrosis or asymmetric enhancement. Unremarkable bladder.  Stomach/Bowel: Subtotal colectomy. No obstruction or inflammation seen. No appendicitis.  Vascular/Lymphatic: Diffuse atherosclerotic calcification. Extensive atherosclerotic plaque on the visceral branches without definite acute finding. No mass or adenopathy.  Reproductive:4.7 x 6.7 cm right ovarian cyst. The elongated cyst has a simple CT appearance but is nonspecific given patient's age.  Other: No ascites or pneumoperitoneum.  Musculoskeletal: No acute finding. Osteopenia and spondylosis. Multilevel lower thoracic ankylosis from bridging osteophytes.  IMPRESSION: 1. No acute finding. 2. Nearly 7 cm right ovarian cyst. Recommend nonemergent pelvic ultrasound for characterization. 3. Probable cirrhosis.  Recommend correlation with risk factors. 4. Subtotal colectomy. 5. 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   Electronically Signed   By: JMonte FantasiaM.D.   On: 04/25/2018 17:45 I have independently reviewed the films.    Assessment & Plan:  ***   - criteria for recurrent UTI  has been met with 2 or more infections in 6 months or 3 or greater infections in one year ***  - patient is instructed to increase their water intake until the urine is pale yellow or clear (10 to 12 cups daily) ***  - patient is instructed to take probiotics (yogurt, oral pills or vaginal suppositories), take cranberry pills or drink the juice and Vitamin C 1,000 mg daily to acidify the urine ***  - if using tampons,  she should remove them prior to urinating and change them often ***  - avoid soaking in tubs and wipe front to back after urinating ***  - benefit from core strengthening exercises has been seen.  We can refer to PT if they desire ***  - advised them to have CATH UA's for urinalysis and culture to prevent skin, vaginal and/or rectal contamination of the specimen  - reviewed symptoms of UTI (fevers, chills, gross hematuria, mental status changes, dysuria, suprapubic pain, back pain and/or sudden worsening of urinary symptoms) and advised not to have urine checked or be treated for UTI if not experiencing symptoms  - discussed antibiotic stewardship with the patient - explained the risk of increasing risk of antibiotic resistance with continuous exposure to antibiotics, renal failure, hypoglycemia, C. Diff infection, allergic reactions, etc.    2. Vaginal atrophy Not a candidate for vaginal estrogen therapy due to history of breast cancer  3. Right ovarian cyst ***                                                 No follow-ups on file.  These notes generated with voice recognition software. I apologize for typographical errors.  Zara Council, PA-C  [facility] [address]  [place], [place] [address] [phone]

## 2018-10-28 ENCOUNTER — Encounter: Payer: Self-pay | Admitting: Obstetrics and Gynecology

## 2018-10-28 ENCOUNTER — Ambulatory Visit (INDEPENDENT_AMBULATORY_CARE_PROVIDER_SITE_OTHER): Payer: Medicare Other | Admitting: Obstetrics and Gynecology

## 2018-10-28 VITALS — BP 130/72 | HR 81 | Ht 69.0 in | Wt 206.0 lb

## 2018-10-28 DIAGNOSIS — N83201 Unspecified ovarian cyst, right side: Secondary | ICD-10-CM | POA: Diagnosis not present

## 2018-10-28 DIAGNOSIS — I200 Unstable angina: Secondary | ICD-10-CM | POA: Diagnosis not present

## 2018-10-28 NOTE — Progress Notes (Signed)
Obstetrics & Gynecology Office Visit   Chief Complaint:  Chief Complaint  Patient presents with  . ER follow up    Right ovarian cyst    History of Present Illness: The patient is a 67 y.o. female presenting for emergency room follow up concerning a incidental right adnexal cyst.  Initial presentation was prompted by abdominal pain.  Previous CT imaging on 04/25/2018 demonstrated dimensions of 4.7cm x 6.7cm. Appearance was notable simple cyst, no free fluid, no lymphadenopathy, no omental caking and absence of ascites. The patient reports being asymptomatic since initial ER presentation in April.  The patient denies associated symptoms of  abdominal pain, pelvic pain, early satiety, weight gain, weight loss, night sweats, vaginal bleeding, pelvic pressure, constipation, nausea and emesis.  There is not a notable family history of ovarian cancer, uterine cancer, breast cancer, or colon cancer.  On my review of the images there appears to be at least 1 potential internal septation.  Prior imaging 04/06/2003 aslo revealed a right adnexal cyst measuing 4.7 X 2.8 CM at that time.  Follow up ultrasound on 04/05/2018  revealed a normal right ovary measuring 3 x 1.7 x 1.8cmm with adjacent fluid filled bowl loops which were attributed to the CT imaging finding.    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    Review of Systems: 10 point review of systems negative unless otherwise noted in HPI  Past Medical History:  Past Medical History:  Diagnosis Date  . Cancer [facility]) 2012   DCIS   . CHF (congestive heart failure) ([place])   . Complication of anesthesia   . Coronary artery disease   . Diabetes mellitus without complication ([place])   . Fracture of humerus, proximal, left, closed 02/23/2014  . Myocardial infarct ([place])   . Neuropathic pain   . Panic attacks    Hx: of  . PONV (postoperative nausea and vomiting)     Past Surgical History:  Past Surgical History:  Procedure Laterality Date  . APPENDECTOMY    . BREAST BIOPSY Left 02/12/15   benign, clip placed  . BREAST SURGERY Right 2012   Right mastectomy with SLN, DCIS, grade 1-2. ER/PR not performed.   Marland Kitchen CARDIAC CATHETERIZATION    . cardiac stents  2007  . CHOLECYSTECTOMY    . COLON SURGERY     Subtotal colectomy for colonic inertia.  Marland Kitchen DILATION AND CURETTAGE OF UTERUS    . EYE SURGERY    . LEFT HEART CATH AND CORONARY ANGIOGRAPHY Left 01/14/2018   Procedure: LEFT HEART CATH AND CORONARY ANGIOGRAPHY;  Surgeon: Dionisio David, MD;  Location: [place] [facility];  Service: Cardiovascular;  Laterality: Left;  Marland Kitchen MASTECTOMY Right 2012   Hx: of right breast, DCIS   . ORIF HUMERUS FRACTURE Left 02/23/2014   Procedure: OPEN REDUCTION INTERNAL FIXATION (ORIF) PROXIMAL HUMERUS FRACTURE;  Surgeon: Johnny Bridge, MD;  Location: [facility];  Service: Orthopedics;  Laterality: Left;  . TUBAL LIGATION      Gynecologic History: No LMP recorded. Patient is postmenopausal.  Obstetric History: G10P4  Family History:  Family History  Problem Relation Age of Onset  . Diabetes Mother   . Hypertension Mother   . Stroke Mother   . Diabetes Brother   . Breast cancer Neg Hx     Social History:  Social History   Socioeconomic History  .  Marital status: Widowed    Spouse name: Not on file  . Number of children: Not on file  . Years of education: Not on file  . Highest education level: Not on file  Occupational History  . Not on file  Social Needs  . Financial resource strain: Not on file  . Food insecurity:    Worry: Not on file    Inability: Not on file  . Transportation needs:    Medical: Not on file    Non-medical: Not on file  Tobacco Use  . Smoking status: Never Smoker  . Smokeless tobacco: Never Used  Substance and Sexual Activity  . Alcohol use: No    Alcohol/week: 0.0 standard  drinks  . Drug use: No  . Sexual activity: Not Currently    Birth control/protection: Post-menopausal  Lifestyle  . Physical activity:    Days per week: Not on file    Minutes per session: Not on file  . Stress: Not on file  Relationships  . Social connections:    Talks on phone: Not on file    Gets together: Not on file    Attends religious service: Not on file    Active member of club or organization: Not on file    Attends meetings of clubs or organizations: Not on file    Relationship status: Not on file  . Intimate partner violence:    Fear of current or ex partner: Not on file    Emotionally abused: Not on file    Physically abused: Not on file    Forced sexual activity: Not on file  Other Topics Concern  . Not on file  Social History Narrative  . Not on file    Allergies:  Allergies  Allergen Reactions  . Cymbalta [Duloxetine Hcl] Other (See Comments)    Vomiting and stiff muscles in legs  . Toradol [Ketorolac Tromethamine] Hives    Medications: Prior to Admission medications   Medication Sig Start Date End Date Taking? Authorizing Provider  acetaminophen (TYLENOL) 500 MG tablet Take 1,000 mg by mouth every 6 (six) hours as needed for moderate pain or headache.   Yes [provider]  atorvastatin (LIPITOR) 40 MG tablet Take 40 mg by mouth at bedtime.   Yes [provider]  Cholecalciferol (VITAMIN D) 2000 units CAPS Take 2,000 Units by mouth daily.    Yes [provider]  clopidogrel (PLAVIX) 75 MG tablet Take 75 mg by mouth daily with breakfast.   Yes [provider]  cyclobenzaprine (FLEXERIL) 10 MG tablet Take 1 tablet (10 mg total) by mouth 2 (two) times daily. Patient taking differently: Take 10 mg by mouth daily.  07/17/15  Yes Molli Barrows, MD  dicyclomine (BENTYL) 10 MG capsule Take 1 capsule (10 mg total) by mouth 3 (three) times daily with meals as needed for spasms. 04/25/18  Yes Arta Silence, MD    diphenoxylate-atropine (LOMOTIL) 2.5-0.025 MG tablet Take 1 tablet by mouth 4 (four) times daily as needed for diarrhea or loose stools.  11/24/17  Yes [provider]  enalapril (VASOTEC) 5 MG tablet Take 5 mg by mouth daily.   Yes [provider]  gabapentin (NEURONTIN) 600 MG tablet Take 1 tablet (600 mg total) by mouth 3 (three) times daily. 07/17/15  Yes Molli Barrows, MD  insulin aspart (NOVOLOG) 100 UNIT/ML injection Inject 20-60 Units into the skin 3 (three) times daily before meals. Per sliding scale   Yes [provider]  insulin glargine (LANTUS) 100 UNIT/ML injection Inject 60 Units into the skin 2 (two) times daily.   Yes [provider]  metoCLOPramide (REGLAN) 10 MG tablet Take 10 mg by mouth every evening.    Yes [provider]  ondansetron (ZOFRAN) 4 MG tablet Take 4 mg by mouth as needed. 01/07/18  Yes [provider]  sacubitril-valsartan (ENTRESTO) 24-26 MG Take by mouth.   Yes [provider]  spironolactone (ALDACTONE) 25 MG tablet Take 25 mg by mouth as needed. 01/07/18  Yes [provider]  traMADol (ULTRAM) 50 MG tablet Take 2 tablets (100 mg total) by mouth every 8 (eight) hours. 08/30/18  Yes Molli Barrows, MD    Physical Exam Vitals:  Vitals:   10/28/18 1425  BP: 130/72  Pulse: 81   No LMP recorded. Patient is postmenopausal.  General: NAD HEENT: normocephalic, anicteric Pulmonary: No increased work of breathing Abdomen: NABS, soft, non-tender, non-distended.  Umbilicus without lesions.  No hepatomegaly, splenomegaly or masses palpable. No evidence of hernia  Extremities: no edema, erythema, or tenderness Neurologic: Grossly intact Psychiatric: mood appropriate, affect full  Female chaperone present for pelvic and breast  portions of the physical exam  No results found.  Assessment: 67 y.o. G4P4 presenting for ER follow up of right ovarian cyst imaged 6 months ago  Plan: Problem  List Items Addressed This Visit    None    Visit Diagnoses    Right ovarian cyst    -  Primary   Relevant Orders   Ovarian Malignancy Risk-ROMA (Completed)   US Transvaginal Non-OB      1)  The incidence and implication of adnexal masses and ovarian cysts were discussed with the patient in detail.  Prior imaging if available was reviewed at today's visit.  While adnexal masses and cysts are a less common imaging finding in postmenopausal women as compared to premenopausal women, the vast majority of these lesions are still benign.  Follow up imaging to determine stability in size and appearance is reasonable in order to provide additional reassurance.  In some cases symptoms, family history, or indeterminate or concerning findings may warrant surgical evaluation and referral to a Gynecology-Oncologist.  The patient states she is not willing to undergo surgery or chemotherapy given her other health problems.  This may be reasonable if the cyst is stable in size with a low risk ROMA, RIM, and LR2 scores.  We discussed that while the overall risk of malignancy wil likely be low given my review of the images, definitive diagnosis would require surgical removal.    2) ROMA ordered  3) IOTA LR2 score and RIM score will be calculated at the time of ultrasound follow up  4) A total of 30 minutes were spent in face-to-face contact with the patient during this encounter with over half of that time devoted to counseling and coordination of care.  Prior radiology images were independently reviewed.  5) Return in about 1 week (around 11/04/2018) for TVUS 1 week right ovarian cyst.   Malachy Mood, MD, [place], [place] 11/01/2018, 10:41 AM

## 2018-10-29 LAB — PREMENOPAUSAL INTERP: HIGH

## 2018-10-29 LAB — POSTMENOPAUSAL INTERP: LOW

## 2018-10-29 LAB — OVARIAN MALIGNANCY RISK-ROMA
Cancer Antigen (CA) 125: 12.2 U/mL (ref 0.0–38.1)
HE4 (HUMAN EPID PROT 4): 161 pmol/L — AB (ref 0.0–96.5)
Postmenopausal ROMA: 2.74
Premenopausal ROMA: 5.62 — ABNORMAL HIGH

## 2018-11-03 ENCOUNTER — Encounter: Payer: Self-pay | Admitting: Obstetrics and Gynecology

## 2018-11-03 ENCOUNTER — Ambulatory Visit (INDEPENDENT_AMBULATORY_CARE_PROVIDER_SITE_OTHER): Payer: Medicare Other

## 2018-11-03 ENCOUNTER — Ambulatory Visit (INDEPENDENT_AMBULATORY_CARE_PROVIDER_SITE_OTHER): Payer: Medicare Other | Admitting: Obstetrics and Gynecology

## 2018-11-03 VITALS — BP 110/70 | HR 82 | Wt 208.0 lb

## 2018-11-03 DIAGNOSIS — N83201 Unspecified ovarian cyst, right side: Secondary | ICD-10-CM

## 2018-11-03 NOTE — Progress Notes (Signed)
Gynecology Ultrasound Follow Up  Chief Complaint:  Chief Complaint  Patient presents with  . Follow-up    GYN U/S     History of Present Illness: Patient is a 66 y.o. female who presents today for ultrasound evaluation of right adnexal cyst.  Ultrasound demonstrates the following findgins Adnexa: ovaries image normally bilaterally.  There is a paraovarian simple fluid filled cyst likely representing hydrosalpinx, stable from CT imaging April 2019 Uterus: Non-enlarged, normal echotexture and contour with non-thickened endometrial stripe   Additional: No free fluid  Review of Systems: Review of Systems  Constitutional: Negative.   Gastrointestinal: Negative.   Genitourinary: Negative.     Past Medical History:  Past Medical History:  Diagnosis Date  . Cancer [facility]) 2012   DCIS   . CHF (congestive heart failure) ([place])   . Complication of anesthesia   . Coronary artery disease   . Diabetes mellitus without complication ([place])   . Fracture of humerus, proximal, left, closed 02/23/2014  . Myocardial infarct ([place])   . Neuropathic pain   . Panic attacks    Hx: of  . PONV (postoperative nausea and vomiting)     Past Surgical History:  Past Surgical History:  Procedure Laterality Date  . APPENDECTOMY    . BREAST BIOPSY Left 02/12/15   benign, clip placed  . BREAST SURGERY Right 2012   Right mastectomy with SLN, DCIS, grade 1-2. ER/PR not performed.   Marland Kitchen CARDIAC CATHETERIZATION    . cardiac stents  2007  . CHOLECYSTECTOMY    . COLON SURGERY     Subtotal colectomy for colonic inertia.  Marland Kitchen DILATION AND CURETTAGE OF UTERUS    . EYE SURGERY    . LEFT HEART CATH AND CORONARY ANGIOGRAPHY Left 01/14/2018   Procedure: LEFT HEART CATH AND CORONARY ANGIOGRAPHY;  Surgeon: Dionisio David, MD;  Location: [facility];  Service: Cardiovascular;  Laterality: Left;  Marland Kitchen MASTECTOMY Right 2012   Hx: of right breast, DCIS   . ORIF HUMERUS FRACTURE Left 02/23/2014   Procedure: OPEN  REDUCTION INTERNAL FIXATION (ORIF) PROXIMAL HUMERUS FRACTURE;  Surgeon: Johnny Bridge, MD;  Location: [facility];  Service: Orthopedics;  Laterality: Left;  . TUBAL LIGATION      Gynecologic History:  No LMP recorded. Patient is postmenopausal.  Family History:  Family History  Problem Relation Age of Onset  . Diabetes Mother   . Hypertension Mother   . Stroke Mother   . Diabetes Brother   . Breast cancer Neg Hx     Social History:  Social History   Socioeconomic History  . Marital status: Widowed    Spouse name: Not on file  . Number of children: Not on file  . Years of education: Not on file  . Highest education level: Not on file  Occupational History  . Not on file  Social Needs  . Financial resource strain: Not on file  . Food insecurity:    Worry: Not on file    Inability: Not on file  . Transportation needs:    Medical: Not on file    Non-medical: Not on file  Tobacco Use  . Smoking status: Never Smoker  . Smokeless tobacco: Never Used  Substance and Sexual Activity  . Alcohol use: No    Alcohol/week: 0.0 standard drinks  . Drug use: No  . Sexual activity: Not Currently    Birth control/protection: Post-menopausal  Lifestyle  . Physical activity:    Days per week:  Not on file    Minutes per session: Not on file  . Stress: Not on file  Relationships  . Social connections:    Talks on phone: Not on file    Gets together: Not on file    Attends religious service: Not on file    Active member of club or organization: Not on file    Attends meetings of clubs or organizations: Not on file    Relationship status: Not on file  . Intimate partner violence:    Fear of current or ex partner: Not on file    Emotionally abused: Not on file    Physically abused: Not on file    Forced sexual activity: Not on file  Other Topics Concern  . Not on file  Social History Narrative  . Not on file    Allergies:  Allergies  Allergen Reactions  . Cymbalta  [Duloxetine Hcl] Other (See Comments)    Vomiting and stiff muscles in legs  . Toradol [Ketorolac Tromethamine] Hives    Medications: Prior to Admission medications   Medication Sig Start Date End Date Taking? Authorizing Provider  atorvastatin (LIPITOR) 40 MG tablet Take 40 mg by mouth at bedtime.   Yes [provider]  Cholecalciferol (VITAMIN D) 2000 units CAPS Take 2,000 Units by mouth daily.    Yes [provider]  clopidogrel (PLAVIX) 75 MG tablet Take 75 mg by mouth daily with breakfast.   Yes [provider]  cyclobenzaprine (FLEXERIL) 10 MG tablet Take 1 tablet (10 mg total) by mouth 2 (two) times daily. Patient taking differently: Take 10 mg by mouth daily.  07/17/15  Yes Molli Barrows, MD  dicyclomine (BENTYL) 10 MG capsule Take 1 capsule (10 mg total) by mouth 3 (three) times daily with meals as needed for spasms. 04/25/18  Yes Arta Silence, MD  diphenoxylate-atropine (LOMOTIL) 2.5-0.025 MG tablet Take 1 tablet by mouth 4 (four) times daily as needed for diarrhea or loose stools.  11/24/17  Yes [provider]  enalapril (VASOTEC) 5 MG tablet Take 5 mg by mouth daily.   Yes [provider]  gabapentin (NEURONTIN) 600 MG tablet Take 1 tablet (600 mg total) by mouth 3 (three) times daily. 07/17/15  Yes Molli Barrows, MD  insulin aspart (NOVOLOG) 100 UNIT/ML injection Inject 20-60 Units into the skin 3 (three) times daily before meals. Per sliding scale   Yes [provider]  insulin glargine (LANTUS) 100 UNIT/ML injection Inject 60 Units into the skin 2 (two) times daily.   Yes [provider]  metoCLOPramide (REGLAN) 10 MG tablet Take 10 mg by mouth every evening.    Yes [provider]  ondansetron (ZOFRAN) 4 MG tablet Take 4 mg by mouth as needed. 01/07/18  Yes [provider]  sacubitril-valsartan (ENTRESTO) 24-26 MG Take by mouth.   Yes [provider]  spironolactone (ALDACTONE) 25 MG  tablet Take 25 mg by mouth as needed. 01/07/18  Yes [provider]  traMADol (ULTRAM) 50 MG tablet Take 2 tablets (100 mg total) by mouth every 8 (eight) hours. 08/30/18  Yes Molli Barrows, MD  acetaminophen (TYLENOL) 500 MG tablet Take 1,000 mg by mouth every 6 (six) hours as needed for moderate pain or headache.    [provider]    Physical Exam Vitals: Blood pressure 110/70, pulse 82, weight 208 lb (94.3 kg).  General: NAD HEENT: normocephalic, anicteric Pulmonary: No increased work of breathing Extremities: no edema, erythema, or  tenderness Neurologic: Grossly intact, normal gait Psychiatric: mood appropriate, affect full  US Transvaginal Non-ob  Result Date: 11/03/2018 ULTRASOUND REPORT Location: [facility] Date of Service: 11/03/2018 Patient Name: [name] DOB: Jun 15, 1951 MRN: [id number] Indications:Right ovary cyst Findings: The uterus is anteverted and measures 6.35 x 3.89 x 2.43 cm. Echo texture is heterogenous without evidence of focal masses. The Endometrium measures 1.26 mm. Right Ovary measures 3.11 x 2.28 x 1.67 cm. Right adnexa with a cystic area measuring 6.0 x 4.42 x 4.09 cm, simple cyst vs fluid in fallopian tube vs other etiology. Left Ovary measures 2.80 x 2.58 x 2.27 cm. It is normal in appearance. Survey of the adnexa demonstrates no adnexal masses. There is no free fluid in the cul de sac. Impression: 1. Right adnexa with a cystic area measuring 6.0 x 4.42 x 4.09 cm, simple cyst vs fluid in fallopian tube vs other etiology. Recommendations: 1.Clinical correlation with the patient's History and Physical Exam. Mital bahen Marlowe Sax, RDMS Images reviewed.  The right adnexal process appears staple in size from CT imaging in April 2019.  Given the elongated nature and the fact that is appears adjacent to the ovary this likely represents a hydrosalpinx.   Malachy Mood, MD, [facility], [facility] 11/03/2018, 10:41 AM    Assessment: 67 y.o. G4P4 follow up right ovarian cyst  Plan: Problem List Items Addressed This Visit    None    Visit Diagnoses    Right ovarian cyst    -  Primary   Relevant Orders   US Transvaginal Non-OB      1) Stable size of right adnexal lesions and asymptomatic.  ROMA low risk of malignancy for postmenopausal patient.  Based on this as well as the risk calculator done (see below) this likely represents a benign process with hydrosalpinx being favored.  Expectant management which the patient opts for is acceptable.  Discussed that definitive diagnosis would require surgical pathology.  2) RMI score favors benign process, IOTA LR2 model 4.9% risk of malignancy.  3) Follow up 1 year to document further stability and repeat ROMA.  If becomes symptomatic I would repeat imaging and give further thought to surgical removal.  4) A total of 15 minutes were spent in face-to-face contact with the patient during this encounter with over half of that time devoted to counseling and coordination of care.  5) Return in about 1 year (around 11/04/2019) for annual with TVUS.   Malachy Mood, MD, [facility], [facility] 11/03/2018, 10:50 AM

## 2018-11-28 ENCOUNTER — Ambulatory Visit: Payer: Medicare Other | Attending: Anesthesiology | Admitting: Anesthesiology

## 2018-11-28 ENCOUNTER — Encounter: Payer: Self-pay | Admitting: Anesthesiology

## 2018-11-28 ENCOUNTER — Other Ambulatory Visit: Payer: Self-pay

## 2018-11-28 VITALS — BP 150/69 | HR 82 | Temp 97.7°F | Resp 18 | Ht 69.0 in | Wt 205.0 lb

## 2018-11-28 DIAGNOSIS — Z79899 Other long term (current) drug therapy: Secondary | ICD-10-CM | POA: Diagnosis not present

## 2018-11-28 DIAGNOSIS — M79643 Pain in unspecified hand: Secondary | ICD-10-CM | POA: Insufficient documentation

## 2018-11-28 DIAGNOSIS — M792 Neuralgia and neuritis, unspecified: Secondary | ICD-10-CM | POA: Diagnosis not present

## 2018-11-28 DIAGNOSIS — Z794 Long term (current) use of insulin: Secondary | ICD-10-CM | POA: Insufficient documentation

## 2018-11-28 DIAGNOSIS — M79673 Pain in unspecified foot: Secondary | ICD-10-CM | POA: Insufficient documentation

## 2018-11-28 DIAGNOSIS — Z760 Encounter for issue of repeat prescription: Secondary | ICD-10-CM | POA: Diagnosis not present

## 2018-11-28 MED ORDER — TRAMADOL HCL 50 MG PO TABS: 100.0000 mg | ORAL_TABLET | Freq: Three times a day (TID) | ORAL | 0 refills | Status: DC

## 2018-11-28 NOTE — Patient Instructions (Signed)
You have been given prescription for tramadol 50mg  to last 3 months.

## 2018-11-28 NOTE — Progress Notes (Signed)
Nursing Pain Medication Assessment:  Safety precautions to be maintained throughout the outpatient stay will include: orient to surroundings, keep bed in low position, maintain call bell within reach at all times, provide assistance with transfer out of bed and ambulation.  Medication Inspection Compliance: Pill count conducted under aseptic conditions, in front of the patient. Neither the pills nor the bottle was removed from the patient's sight at any time. Once count was completed pills were immediately returned to the patient in their original bottle.  Medication: Tramadol (Ultram) Pill/Patch Count: 11 of 180 pills remain Pill/Patch Appearance: Markings consistent with prescribed medication Bottle Appearance: Standard pharmacy container. Clearly labeled. Filled Date: 10 / 31 / 2019 Last Medication intake:  Today

## 2018-11-29 NOTE — Progress Notes (Signed)
Subjective:  Patient ID: [name], female    DOB: 1951/10/22  Age: 67 y.o. MRN: [id number]  CC: Medication Refill (Tramadol 50mg  )   Procedure: None  HPI [name] presents for reevaluation.  She was last seen approximate 3 months ago and has continued to have the same quality characteristic and distribution of lower extremity pain.  She has a pain in the legs which is a gnawing aching occasional lancinating pain is similar in quality to what she is experienced for the past several years.  She continues to take her medications as prescribed using Flexeril and tramadol 2 tablets approximately every 3 times a day.  This combination has kept her pain under reasonable control and allows her to continue to be functional.  In review of her narcotic assessment sheet she continues to derive good functional lifestyle improvement with the medications with no untoward side effects noted.  Otherwise she is in her usual state of health this time.  No change in lower extremity strength or function or bowel bladder function are noted.  Outpatient Medications Prior to Visit  Medication Sig Dispense Refill  . acetaminophen (TYLENOL) 500 MG tablet Take 1,000 mg by mouth every 6 (six) hours as needed for moderate pain or headache.    Marland Kitchen atorvastatin (LIPITOR) 40 MG tablet Take 40 mg by mouth at bedtime.    . BD INSULIN SYRINGE U/F 31G X 5/16" 1 ML MISC     . Cholecalciferol (VITAMIN D) 2000 units CAPS Take 2,000 Units by mouth daily.     . clonazePAM (KLONOPIN) 0.5 MG tablet     . clopidogrel (PLAVIX) 75 MG tablet Take 75 mg by mouth daily with breakfast.    . cyclobenzaprine (FLEXERIL) 10 MG tablet Take 1 tablet (10 mg total) by mouth 2 (two) times daily. (Patient taking differently: Take 10 mg by mouth daily. ) 60 tablet 5  . dicyclomine (BENTYL) 10 MG capsule Take 1 capsule (10 mg total) by mouth 3 (three) times daily with meals as needed for spasms. 15 capsule 0  .  diphenoxylate-atropine (LOMOTIL) 2.5-0.025 MG tablet Take 1 tablet by mouth 4 (four) times daily as needed for diarrhea or loose stools.     . enalapril (VASOTEC) 5 MG tablet Take 5 mg by mouth daily.    Marland Kitchen gabapentin (NEURONTIN) 600 MG tablet Take 1 tablet (600 mg total) by mouth 3 (three) times daily. 90 tablet 5  . insulin aspart (NOVOLOG) 100 UNIT/ML injection Inject 20-60 Units into the skin 3 (three) times daily before meals. Per sliding scale    . insulin glargine (LANTUS) 100 UNIT/ML injection Inject 60 Units into the skin 2 (two) times daily.    . metoCLOPramide (REGLAN) 10 MG tablet Take 10 mg by mouth every evening.     . ondansetron (ZOFRAN) 4 MG tablet Take 4 mg by mouth as needed.    . pantoprazole (PROTONIX) 40 MG tablet     . sacubitril-valsartan (ENTRESTO) 24-26 MG Take by mouth.    . spironolactone (ALDACTONE) 25 MG tablet Take 25 mg by mouth as needed.    . traMADol (ULTRAM) 50 MG tablet Take 2 tablets (100 mg total) by mouth every 8 (eight) hours. 540 tablet 0   No facility-administered medications prior to visit.     Review of Systems CNS: No confusion or sedation Cardiac: No angina or palpitations GI: No abdominal pain or constipation Constitutional: No nausea vomiting fevers or chills  Objective:  BP (!) 150/69 (  BP Location: Left Arm, Patient Position: Sitting, Cuff Size: Large)   Pulse 82   Temp 97.7 F (36.5 C)   Resp 18   Ht 5\' 9"  (1.753 m)   Wt 205 lb (93 kg)   SpO2 100%   BMI 30.27 kg/m    BP Readings from Last 3 Encounters:  11/28/18 (!) 150/69  11/03/18 110/70  10/28/18 130/72     Wt Readings from Last 3 Encounters:  11/28/18 205 lb (93 kg)  11/03/18 208 lb (94.3 kg)  10/28/18 206 lb (93.4 kg)     Physical Exam Pt is alert and oriented PERRL EOMI HEART IS RRR no murmur or rub LCTA no wheezing or rales MUSCULOSKELETAL reveals a mildly antalgic gait but her muscle tone and bulk appears to be at baseline.  She is ambulating reasonably  well.  Labs  No results found for: HGBA1C Lab Results  Component Value Date   CREATININE 1.25 (H) 04/25/2018    -------------------------------------------------------------------------------------------------------------------- Lab Results  Component Value Date   WBC 7.7 04/25/2018   HGB 12.4 04/25/2018   HCT 37.4 04/25/2018   PLT 297 04/25/2018   GLUCOSE 220 (H) 04/25/2018   ALT 24 04/25/2018   AST 42 (H) 04/25/2018   NA 133 (L) 04/25/2018   K 5.1 04/25/2018   CL 101 04/25/2018   CREATININE 1.25 (H) 04/25/2018   BUN 24 (H) 04/25/2018   CO2 26 04/25/2018    --------------------------------------------------------------------------------------------------------------------- Mm 3d Screen Breast Uni Left  Result Date: 08/11/2018 CLINICAL DATA:  Screening. EXAM: DIGITAL SCREENING UNILATERAL LEFT MAMMOGRAM WITH CAD AND TOMO COMPARISON:  Previous exam(s). ACR Breast Density Category b: There are scattered areas of fibroglandular density. FINDINGS: There are no findings suspicious for malignancy. Images were processed with CAD. IMPRESSION: No mammographic evidence of malignancy. A result letter of this screening mammogram will be mailed directly to the patient. RECOMMENDATION: Screening mammogram in one year. (Code:SM-B-01Y) BI-RADS CATEGORY  1: Negative. Electronically Signed   By: Abelardo Diesel M.D.   On: 08/11/2018 13:08     Assessment & Plan:   [name] was seen today for medication refill.  Diagnoses and all orders for this visit:  Neuropathic pain of foot, right  Neuropathic pain of foot, left  Neuropathic pain of hand, unspecified laterality  Other orders -     traMADol (ULTRAM) 50 MG tablet; Take 2 tablets (100 mg total) by mouth every 8 (eight) hours.        ----------------------------------------------------------------------------------------------------------------------  Problem List Items Addressed This Visit    None    Visit Diagnoses     Neuropathic pain of foot, right    -  Primary   Neuropathic pain of foot, left       Neuropathic pain of hand, unspecified laterality            ----------------------------------------------------------------------------------------------------------------------  1. Neuropathic pain of foot, right We will have her continue on her current regimen.  I am going to refill her tramadol for the next 3 months with return to clinic scheduled in 3 months.  I have reviewed her narcotic assessment sheet she continues to do well with the medication.  Furthermore we have reviewed the [facility] practitioner database information and it is appropriate.  2. Neuropathic pain of foot, left As above  3. Neuropathic pain of hand, unspecified laterality As above and continue follow-up with her primary care physicians for baseline medical care.    ----------------------------------------------------------------------------------------------------------------------  I am having [name] maintain her clopidogrel, atorvastatin, metoCLOPramide,  insulin aspart, Vitamin D, cyclobenzaprine, gabapentin, enalapril, insulin glargine, diphenoxylate-atropine, acetaminophen, ondansetron, spironolactone, dicyclomine, sacubitril-valsartan, clonazePAM, BD INSULIN SYRINGE U/F, pantoprazole, and traMADol.   Meds ordered this encounter  Medications  . traMADol (ULTRAM) 50 MG tablet    Sig: Take 2 tablets (100 mg total) by mouth every 8 (eight) hours.    Dispense:  540 tablet    Refill:  0    Do not fill until [id number]   This is a three month supply   Patient's Medications  New Prescriptions   No medications on file  Previous Medications   ACETAMINOPHEN (TYLENOL) 500 MG TABLET    Take 1,000 mg by mouth every 6 (six) hours as needed for moderate pain or headache.   ATORVASTATIN (LIPITOR) 40 MG TABLET    Take 40 mg by mouth at bedtime.   BD INSULIN SYRINGE U/F 31G X 5/16" 1 ML MISC        CHOLECALCIFEROL (VITAMIN D) 2000 UNITS CAPS    Take 2,000 Units by mouth daily.    CLONAZEPAM (KLONOPIN) 0.5 MG TABLET       CLOPIDOGREL (PLAVIX) 75 MG TABLET    Take 75 mg by mouth daily with breakfast.   CYCLOBENZAPRINE (FLEXERIL) 10 MG TABLET    Take 1 tablet (10 mg total) by mouth 2 (two) times daily.   DICYCLOMINE (BENTYL) 10 MG CAPSULE    Take 1 capsule (10 mg total) by mouth 3 (three) times daily with meals as needed for spasms.   DIPHENOXYLATE-ATROPINE (LOMOTIL) 2.5-0.025 MG TABLET    Take 1 tablet by mouth 4 (four) times daily as needed for diarrhea or loose stools.    ENALAPRIL (VASOTEC) 5 MG TABLET    Take 5 mg by mouth daily.   GABAPENTIN (NEURONTIN) 600 MG TABLET    Take 1 tablet (600 mg total) by mouth 3 (three) times daily.   INSULIN ASPART (NOVOLOG) 100 UNIT/ML INJECTION    Inject 20-60 Units into the skin 3 (three) times daily before meals. Per sliding scale   INSULIN GLARGINE (LANTUS) 100 UNIT/ML INJECTION    Inject 60 Units into the skin 2 (two) times daily.   METOCLOPRAMIDE (REGLAN) 10 MG TABLET    Take 10 mg by mouth every evening.    ONDANSETRON (ZOFRAN) 4 MG TABLET    Take 4 mg by mouth as needed.   PANTOPRAZOLE (PROTONIX) 40 MG TABLET       SACUBITRIL-VALSARTAN (ENTRESTO) 24-26 MG    Take by mouth.   SPIRONOLACTONE (ALDACTONE) 25 MG TABLET    Take 25 mg by mouth as needed.  Modified Medications   Modified Medication Previous Medication   TRAMADOL (ULTRAM) 50 MG TABLET traMADol (ULTRAM) 50 MG tablet      Take 2 tablets (100 mg total) by mouth every 8 (eight) hours.    Take 2 tablets (100 mg total) by mouth every 8 (eight) hours.  Discontinued Medications   No medications on file   ----------------------------------------------------------------------------------------------------------------------  Follow-up: Return in about 3 months (around 02/27/2019) for evaluation, med refill.    Molli Barrows, MD

## 2019-02-09 ENCOUNTER — Other Ambulatory Visit: Payer: Self-pay

## 2019-02-09 ENCOUNTER — Emergency Department
Admission: EM | Admit: 2019-02-09 | Discharge: 2019-02-09 | Disposition: A | Discharge status: Home / Self Care | Payer: Medicare Other | Attending: Student in an Organized Health Care Education/Training Program | Admitting: Student in an Organized Health Care Education/Training Program

## 2019-02-09 ENCOUNTER — Emergency Department: Payer: Medicare Other

## 2019-02-09 ENCOUNTER — Encounter: Payer: Self-pay | Admitting: Emergency Medicine

## 2019-02-09 DIAGNOSIS — Z794 Long term (current) use of insulin: Secondary | ICD-10-CM | POA: Diagnosis not present

## 2019-02-09 DIAGNOSIS — I252 Old myocardial infarction: Secondary | ICD-10-CM | POA: Insufficient documentation

## 2019-02-09 DIAGNOSIS — R635 Abnormal weight gain: Secondary | ICD-10-CM | POA: Diagnosis not present

## 2019-02-09 DIAGNOSIS — Z79899 Other long term (current) drug therapy: Secondary | ICD-10-CM | POA: Insufficient documentation

## 2019-02-09 DIAGNOSIS — R0600 Dyspnea, unspecified: Secondary | ICD-10-CM | POA: Diagnosis present

## 2019-02-09 DIAGNOSIS — Z86000 Personal history of in-situ neoplasm of breast: Secondary | ICD-10-CM | POA: Insufficient documentation

## 2019-02-09 DIAGNOSIS — I509 Heart failure, unspecified: Secondary | ICD-10-CM | POA: Insufficient documentation

## 2019-02-09 DIAGNOSIS — R2243 Localized swelling, mass and lump, lower limb, bilateral: Secondary | ICD-10-CM | POA: Diagnosis not present

## 2019-02-09 DIAGNOSIS — E1140 Type 2 diabetes mellitus with diabetic neuropathy, unspecified: Secondary | ICD-10-CM | POA: Diagnosis not present

## 2019-02-09 LAB — CBC
HCT: 30.8 % — ABNORMAL LOW (ref 36.0–46.0)
Hemoglobin: 9.4 g/dL — ABNORMAL LOW (ref 12.0–15.0)
MCH: 26.6 pg (ref 26.0–34.0)
MCHC: 30.5 g/dL (ref 30.0–36.0)
MCV: 87.3 fL (ref 80.0–100.0)
Platelets: 177 10*3/uL (ref 150–400)
RBC: 3.53 MIL/uL — AB (ref 3.87–5.11)
RDW: 17.1 % — ABNORMAL HIGH (ref 11.5–15.5)
WBC: 6.9 10*3/uL (ref 4.0–10.5)
nRBC: 0 % (ref 0.0–0.2)

## 2019-02-09 LAB — BASIC METABOLIC PANEL
ANION GAP: 7 (ref 5–15)
BUN: 35 mg/dL — ABNORMAL HIGH (ref 8–23)
CO2: 23 mmol/L (ref 22–32)
Calcium: 8.4 mg/dL — ABNORMAL LOW (ref 8.9–10.3)
Chloride: 105 mmol/L (ref 98–111)
Creatinine, Ser: 1.65 mg/dL — ABNORMAL HIGH (ref 0.44–1.00)
GFR calc Af Amer: 37 mL/min — ABNORMAL LOW (ref >60)
GFR calc non Af Amer: 32 mL/min — ABNORMAL LOW (ref >60)
GLUCOSE: 206 mg/dL — AB (ref 70–99)
Potassium: 4.6 mmol/L (ref 3.5–5.1)
Sodium: 135 mmol/L (ref 135–145)

## 2019-02-09 LAB — TROPONIN I
Troponin I: 0.05 ng/mL (ref <0.03)
Troponin I: 0.05 ng/mL (ref <0.03)

## 2019-02-09 LAB — BRAIN NATRIURETIC PEPTIDE: B Natriuretic Peptide: 706 pg/mL — ABNORMAL HIGH (ref 0.0–100.0)

## 2019-02-09 MED ORDER — FUROSEMIDE 10 MG/ML IJ SOLN
40.0000 mg | Freq: Once | INTRAMUSCULAR | Status: AC
  Administered 2019-02-09: 40 mg via INTRAVENOUS
  Filled 2019-02-09: qty 4

## 2019-02-09 MED ORDER — LIDOCAINE HCL (PF) 1 % IJ SOLN
5.0000 mL | Freq: Once | INTRAMUSCULAR | Status: AC
  Administered 2019-02-09: 5 mL via INTRADERMAL
  Filled 2019-02-09: qty 5

## 2019-02-09 NOTE — ED Triage Notes (Addendum)
Says she has weight gain and swelling and shortness of breath gradually over last month.  Says her doctor has been trying to put her on different doses of lasix without improvement.

## 2019-02-09 NOTE — ED Notes (Signed)
Pt up to bedside toilet now.

## 2019-02-09 NOTE — ED Notes (Signed)
Pt resting in bed.

## 2019-02-09 NOTE — ED Provider Notes (Signed)
Seen in signout from Dr. Alfred Levins.  As planned repeat troponin remained stable and the patient is asymptomatic.  She has received a dose of Lasix and feels well and would like to go home to follow-up with Dr. Yancey Flemings tomorrow which I think is reasonable.  Due to difficulty with obtaining IV access, a 20G peripheral IV catheter was inserted using US guidance into the lue.  The site was prepped with chlorhexidine and allowed to dry.  The patient tolerated the procedure without any complications.    Merlyn Lot, MD 02/09/19 [phone]

## 2019-02-09 NOTE — ED Notes (Signed)
Patient ambulatory without assistance. Ambulatory pulse ox staying at 97%. Patient reports she is breathing better than she was but states her legs are still feeling fatigued. MD made aware.

## 2019-02-09 NOTE — ED Notes (Signed)
Attempted IV twice that was unsuccessful. Patients vein would blow when the IV was flushed

## 2019-02-09 NOTE — Discharge Instructions (Addendum)
Increase your Lasix to 40 mg daily starting tomorrow. Follow up with Dr. Humphrey Rolls tomorrow at [facility].  Return to the emergency room if you have chest pain or worsening shortness of breath.

## 2019-02-09 NOTE — ED Provider Notes (Signed)
[facility] Emergency Department Provider Note  ____________________________________________  Time seen: Approximately 2:42 PM  I have reviewed the triage vital signs and the nursing notes.   HISTORY  Chief Complaint Shortness of Breath   HPI [name] is a 68 y.o. female with a history of CHF with EF of 25 to 30%, CAD, diabetes, hypertension who presents for evaluation of dyspnea with exertion.  Patient reports that she has been struggling with dyspnea with exertion for about a month.  She has been on Lasix 20 mg.  She reports that today the dyspnea became worse which prompted her to call her cardiologist office.  She was instructed to increase her Lasix to 40 mg which she did this morning.  She reports a 15 pound weight gain over the last month.  She denies orthopnea.  She denies shortness of breath at rest.  She denies chest pain, personal or family history of PE or DVT, fever or chills, cough or congestion, leg pain.  She does have progressively worsening swelling of bilateral lower extremities.   Past Medical History:  Diagnosis Date  . 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 CHF (congestive heart failure) ([place])   . Complication of anesthesia   . Coronary artery disease   . Diabetes mellitus without complication ([place])   . Fracture of humerus, proximal, left, closed 02/23/2014  . Myocardial infarct ([place])   . Neuropathic pain   . Panic attacks    Hx: of  . PONV (postoperative nausea and vomiting)     Patient Active Problem List   Diagnosis Date Noted  . Gastroparesis due to DM ([place]) 04/21/2016  . 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 Peripheral polyneuropathy 11/14/2014  . Fracture of humerus, proximal, left, closed 02/23/2014  . Proximal humerus fracture 02/23/2014    Past Surgical History:  Procedure Laterality Date  . APPENDECTOMY    . BREAST BIOPSY Left 02/12/15   benign, clip placed  . BREAST SURGERY Right 2012   Right mastectomy with SLN, DCIS, grade 1-2. ER/PR not performed.   Marland Kitchen CARDIAC CATHETERIZATION    . cardiac stents  2007  . CHOLECYSTECTOMY    . COLON SURGERY     Subtotal colectomy for colonic inertia.  Marland Kitchen DILATION AND CURETTAGE OF UTERUS    . EYE SURGERY    . LEFT HEART CATH AND CORONARY ANGIOGRAPHY Left 01/14/2018   Procedure: LEFT HEART CATH AND CORONARY ANGIOGRAPHY;  Surgeon: Dionisio David, MD;  Location: [place] CV LAB;  Service: Cardiovascular;  Laterality: Left;  Marland Kitchen MASTECTOMY Right 2012   Hx: of right breast, DCIS   . ORIF HUMERUS FRACTURE Left 02/23/2014   Procedure: OPEN REDUCTION INTERNAL FIXATION (ORIF) PROXIMAL HUMERUS FRACTURE;  Surgeon: Johnny Bridge, MD;  Location: [place];  Service: Orthopedics;  Laterality: Left;  . TUBAL LIGATION      Prior to Admission medications   Medication Sig Start Date End Date Taking? Authorizing Provider  acetaminophen (TYLENOL) 500 MG tablet Take 1,000 mg by mouth every 6 (six) hours as needed for moderate pain or headache.    [provider]  atorvastatin (LIPITOR) 40 MG tablet Take 40 mg by mouth at bedtime.    [provider]  BD INSULIN SYRINGE U/F 31G X 5/16" 1 ML MISC  09/03/18   [provider]  Cholecalciferol (VITAMIN D) 2000 units CAPS Take 2,000 Units by mouth daily.  [provider]  clonazePAM Bobbye [place]) 0.5 MG tablet  08/07/18   [provider]  clopidogrel (PLAVIX) 75 MG tablet Take 75 mg by mouth daily with breakfast.    [provider]  cyclobenzaprine (FLEXERIL) 10 MG tablet Take 1 tablet (10 mg total) by mouth 2 (two) times daily. Patient taking differently: Take 10 mg by mouth daily.  07/17/15   Molli Barrows, MD  dicyclomine (BENTYL) 10 MG capsule Take 1 capsule (10 mg total) by mouth 3 (three) times daily with meals as needed for spasms. 04/25/18   Arta Silence, MD  diphenoxylate-atropine (LOMOTIL) 2.5-0.025  MG tablet Take 1 tablet by mouth 4 (four) times daily as needed for diarrhea or loose stools.  11/24/17   [provider]  enalapril (VASOTEC) 5 MG tablet Take 5 mg by mouth daily.    [provider]  gabapentin (NEURONTIN) 600 MG tablet Take 1 tablet (600 mg total) by mouth 3 (three) times daily. 07/17/15   Molli Barrows, MD  insulin aspart (NOVOLOG) 100 UNIT/ML injection Inject 20-60 Units into the skin 3 (three) times daily before meals. Per sliding scale    [provider]  insulin glargine (LANTUS) 100 UNIT/ML injection Inject 60 Units into the skin 2 (two) times daily.    [provider]  metoCLOPramide (REGLAN) 10 MG tablet Take 10 mg by mouth every evening.     [provider]  ondansetron (ZOFRAN) 4 MG tablet Take 4 mg by mouth as needed. 01/07/18   [provider]  pantoprazole (PROTONIX) 40 MG tablet  10/01/18   [provider]  sacubitril-valsartan (ENTRESTO) 24-26 MG Take by mouth.    [provider]  spironolactone (ALDACTONE) 25 MG tablet Take 25 mg by mouth as needed. 01/07/18   [provider]  traMADol (ULTRAM) 50 MG tablet Take 2 tablets (100 mg total) by mouth every 8 (eight) hours. 11/28/18   Molli Barrows, MD    Allergies Cymbalta [duloxetine hcl] and Toradol [ketorolac tromethamine]  Family History  Problem Relation Age of Onset  . Diabetes Mother   . Hypertension Mother   . Stroke Mother   . Diabetes Brother   . Breast cancer Neg Hx     Social History Social History   Tobacco Use  . Smoking status: Never Smoker  . Smokeless tobacco: Never Used  Substance Use Topics  . Alcohol use: No    Alcohol/week: 0.0 standard drinks  . Drug use: No    Review of Systems  Constitutional: Negative for fever. + 15-lb weight gain Eyes: Negative for visual changes. ENT: Negative for sore throat. Neck: No neck pain  Cardiovascular: Negative for chest pain. Respiratory: + shortness of  breath. Gastrointestinal: Negative for abdominal pain, vomiting or diarrhea. Genitourinary: Negative for dysuria. Musculoskeletal: Negative for back pain. + b/l LE edema Skin: Negative for rash. Neurological: Negative for headaches, weakness or numbness. Psych: No SI or HI  ____________________________________________   PHYSICAL EXAM:  VITAL SIGNS: ED Triage Vitals  Enc Vitals Group     BP 02/09/19 1307 (!) 112/59     Pulse Rate 02/09/19 1307 92     Resp 02/09/19 1307 16     Temp 02/09/19 1307 98.3 F (36.8 C)     Temp Source 02/09/19 1307 Oral     SpO2 02/09/19 1307 100 %     Weight 02/09/19 1308 220 lb (99.8 kg)     Height 02/09/19 1308 5\' 9"  (1.753 m)  Head Circumference --      Peak Flow --      Pain Score 02/09/19 1307 6     Pain Loc --      Pain Edu? --      Excl. in [place]? --     Constitutional: Alert and oriented. Well appearing and in no apparent distress. HEENT:      Head: Normocephalic and atraumatic.         Eyes: Conjunctivae are normal. Sclera is non-icteric.       Mouth/Throat: Mucous membranes are moist.       Neck: Supple with no signs of meningismus. Cardiovascular: Regular rate and rhythm. No murmurs, gallops, or rubs. 2+ symmetrical distal pulses are present in all extremities. No JVD. Respiratory: Normal respiratory effort. Lungs are clear to auscultation bilaterally. No wheezes, crackles, or rhonchi.  Gastrointestinal: Soft, non tender, and non distended with positive bowel sounds. No rebound or guarding. Musculoskeletal: 1+ pitting edema bilaterally  neurologic: Normal speech and language. Face is symmetric. Moving all extremities. No gross focal neurologic deficits are appreciated. Skin: Skin is warm, dry and intact. No rash noted. Psychiatric: Mood and affect are normal. Speech and behavior are normal.  ____________________________________________   LABS (all labs ordered are listed, but only abnormal results are displayed)  Labs Reviewed   BASIC METABOLIC PANEL - Abnormal; Notable for the following components:      Result Value   Glucose, Bld 206 (*)    BUN 35 (*)    Creatinine, Ser 1.65 (*)    Calcium 8.4 (*)    GFR calc non Af Amer 32 (*)    GFR calc Af Amer 37 (*)    All other components within normal limits  CBC - Abnormal; Notable for the following components:   RBC 3.53 (*)    Hemoglobin 9.4 (*)    HCT 30.8 (*)    RDW 17.1 (*)    All other components within normal limits  TROPONIN I - Abnormal; Notable for the following components:   Troponin I 0.05 (*)    All other components within normal limits  BRAIN NATRIURETIC PEPTIDE   ____________________________________________  EKG  ED ECG REPORT I, Rudene Re, the attending physician, personally viewed and interpreted this ECG.  Normal sinus rhythm, rate of 90, prolonged QTC, right axis deviation, right bundle branch block, no ST elevations or depressions.  Unchanged from prior.   ____________________________________________  RADIOLOGY  I have personally reviewed the images performed during this visit and I agree with the Radiologist's read.   Interpretation by Radiologist:  Dg Chest 2 View  Result Date: 02/09/2019 CLINICAL DATA:  Shortness of breath. EXAM: CHEST - 2 VIEW COMPARISON:  Radiographs of October 13, 2013. FINDINGS: Stable cardiomegaly with central pulmonary vascular congestion. No pneumothorax is noted. No consolidative process is noted. Minimal pleural effusions may be present. The visualized skeletal structures are unremarkable. IMPRESSION: Stable cardiomegaly with central pulmonary vascular congestion. Minimal pleural effusions may be present. Electronically Signed   By: Marijo Conception, M.D.   On: 02/09/2019 13:42     ____________________________________________   PROCEDURES  Procedure(s) performed: None Procedures Critical Care performed:  None ____________________________________________   INITIAL IMPRESSION / ASSESSMENT  AND PLAN / ED COURSE   68 y.o. female with a history of CHF with EF of 25 to 30%, CAD, diabetes, hypertension who presents for evaluation of dyspnea with exertion, 15 pound weight gain and bilateral lower extremity swelling x4 weeks.  Patient looks volume overloaded  on exam.  Patient has normal work of breathing and sats both at rest and with ambulation.  Chest x-ray concerning for cardiomegaly and pulmonary vascular congestion.  EKG showing no acute ischemic changes or dysrhythmias.  Kidney function is slightly worsened with creatinine of 1.65 (baseline 1.2 to 1.4). Troponin pending. Discussed with patient's cardiologist Dr. Humphrey Rolls who recommended IV lasix 40mg , increasing home dose to 40mg  daily and f/u tomorrow at 10AM in his office.       As part of my medical decision making, I reviewed the following data within the [place] notes reviewed and incorporated, Labs reviewed , EKG interpreted , Old EKG reviewed, Old chart reviewed, Radiograph reviewed , A consult was requested and obtained from this/these consultant(s) Cardiology, Notes from prior ED visits and [place] Controlled Substance Database    Pertinent labs & imaging results that were available during my care of the patient were reviewed by me and considered in my medical decision making (see chart for details).    ____________________________________________   FINAL CLINICAL IMPRESSION(S) / ED DIAGNOSES  Final diagnoses:  Acute on chronic congestive heart failure, unspecified heart failure type ([place])      NEW MEDICATIONS STARTED DURING THIS VISIT:  ED Discharge Orders    None       Note:  This document was prepared using Dragon voice recognition software and may include unintentional dictation errors.    Alfred Levins, [place], MD 02/09/19 [phone]

## 2019-02-09 NOTE — ED Notes (Signed)
Attempted IV x2. Will have 2nd RN try.

## 2019-02-15 ENCOUNTER — Other Ambulatory Visit: Payer: Self-pay

## 2019-02-15 ENCOUNTER — Encounter: Payer: Self-pay | Admitting: Emergency Medicine

## 2019-02-15 ENCOUNTER — Emergency Department: Payer: Medicare Other

## 2019-02-15 ENCOUNTER — Emergency Department
Admission: EM | Admit: 2019-02-15 | Discharge: 2019-02-15 | Disposition: A | Discharge status: Home / Self Care | Payer: Medicare Other | Attending: Emergency Medicine | Admitting: Emergency Medicine

## 2019-02-15 DIAGNOSIS — I2510 Atherosclerotic heart disease of native coronary artery without angina pectoris: Secondary | ICD-10-CM | POA: Insufficient documentation

## 2019-02-15 DIAGNOSIS — E1143 Type 2 diabetes mellitus with diabetic autonomic (poly)neuropathy: Secondary | ICD-10-CM | POA: Insufficient documentation

## 2019-02-15 DIAGNOSIS — Y939 Activity, unspecified: Secondary | ICD-10-CM | POA: Insufficient documentation

## 2019-02-15 DIAGNOSIS — E1140 Type 2 diabetes mellitus with diabetic neuropathy, unspecified: Secondary | ICD-10-CM | POA: Insufficient documentation

## 2019-02-15 DIAGNOSIS — I509 Heart failure, unspecified: Secondary | ICD-10-CM | POA: Insufficient documentation

## 2019-02-15 DIAGNOSIS — W19XXXA Unspecified fall, initial encounter: Secondary | ICD-10-CM | POA: Diagnosis not present

## 2019-02-15 DIAGNOSIS — Z859 Personal history of malignant neoplasm, unspecified: Secondary | ICD-10-CM | POA: Diagnosis not present

## 2019-02-15 DIAGNOSIS — S82891A Other fracture of right lower leg, initial encounter for closed fracture: Secondary | ICD-10-CM

## 2019-02-15 DIAGNOSIS — S8991XA Unspecified injury of right lower leg, initial encounter: Secondary | ICD-10-CM | POA: Diagnosis present

## 2019-02-15 DIAGNOSIS — Y929 Unspecified place or not applicable: Secondary | ICD-10-CM | POA: Insufficient documentation

## 2019-02-15 DIAGNOSIS — Y999 Unspecified external cause status: Secondary | ICD-10-CM | POA: Diagnosis not present

## 2019-02-15 MED ORDER — OXYCODONE-ACETAMINOPHEN 5-325 MG PO TABS
2.0000 | ORAL_TABLET | Freq: Once | ORAL | Status: AC
  Administered 2019-02-15: 2 via ORAL
  Filled 2019-02-15: qty 2

## 2019-02-15 MED ORDER — DIAZEPAM 5 MG PO TABS
10.0000 mg | ORAL_TABLET | Freq: Once | ORAL | Status: AC
  Administered 2019-02-15: 10 mg via ORAL
  Filled 2019-02-15: qty 2

## 2019-02-15 MED ORDER — OXYCODONE-ACETAMINOPHEN 7.5-325 MG PO TABS: 1.0000 | ORAL_TABLET | ORAL | 0 refills | Status: DC | PRN

## 2019-02-15 MED ORDER — DIAZEPAM 5 MG PO TABS: 5.0000 mg | ORAL_TABLET | Freq: Three times a day (TID) | ORAL | 0 refills | Status: DC | PRN

## 2019-02-15 NOTE — ED Notes (Signed)
Splint applied by this RN and ED MD. Pt experiencing bad leg spasms extending up into thigh and hip. leg spasms caused a need for additional alignment . Realigned by MD.

## 2019-02-15 NOTE — ED Triage Notes (Addendum)
Pt via EMS with c/o dizziness that ended in a fall this am. Pt has RT ankle pain with obvious deformity. Pt is on anticoags but had no head injury with fall.  MD at bedside

## 2019-02-15 NOTE — ED Notes (Signed)
Purewick placed on pt at this time.  °

## 2019-02-15 NOTE — ED Notes (Signed)
After speaking to MD pt states she wishes to go home. Pt able to stand with crutches and assisted to toilet in room.

## 2019-02-15 NOTE — ED Notes (Signed)
Pt placed on bedpan for BM at this time

## 2019-02-15 NOTE — ED Notes (Signed)
Pt given crutches and walker with teaching. When attempting to get pt to stand with crutches she stated that she was getting muscle cramps in right calf. Pt not willing to attempt to move with crutches at this time due to cramping. Pt states she believes she will not be able to get into her house and will need EMS transport home. Pt then states  "I just need to be admitted today and have surgery completed as soon as possible". MD made aware and will speak to pt about options

## 2019-02-15 NOTE — ED Provider Notes (Signed)
[facility] Emergency Department Provider Note       Time seen: ----------------------------------------- 10:29 AM on 02/15/2019 -----------------------------------------   I have reviewed the triage vital signs and the nursing notes.  HISTORY   Chief Complaint Ankle Pain    HPI [name] is a 68 y.o. female with a history of CHF, coronary disease, diabetes, MI, panic attacks, gastroparesis who presents to the ED for right ankle pain.  Patient planes of dizziness that ended up in a fall this morning.  She complains of right ankle pain with an obvious deformity of the right ankle.  She denies fevers, chills or other complaints.  Past Medical History:  Diagnosis Date  . 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 CHF (congestive heart failure) ([place])   . Complication of anesthesia   . Coronary artery disease   . Diabetes mellitus without complication ([place])   . Fracture of humerus, proximal, left, closed 02/23/2014  . Myocardial infarct ([place])   . Neuropathic pain   . Panic attacks    Hx: of  . PONV (postoperative nausea and vomiting)     Patient Active Problem List   Diagnosis Date Noted  . Gastroparesis due to DM ([place]) 04/21/2016  . DM type 2 with diabetic peripheral neuropathy ([place]) 03/12/2016  . 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 Peripheral polyneuropathy 11/14/2014  . Fracture of humerus, proximal, left, closed 02/23/2014  . Proximal humerus fracture 02/23/2014    Past Surgical History:  Procedure Laterality Date  . APPENDECTOMY    . BREAST BIOPSY Left 02/12/15   benign, clip placed  . BREAST SURGERY Right 2012   Right mastectomy with SLN, DCIS, grade 1-2. ER/PR not performed.   Marland Kitchen CARDIAC CATHETERIZATION    . cardiac stents  2007  . CHOLECYSTECTOMY    . COLON SURGERY     Subtotal colectomy for colonic inertia.  Marland Kitchen DILATION AND CURETTAGE OF UTERUS    . EYE SURGERY    . LEFT HEART CATH AND CORONARY ANGIOGRAPHY Left 01/14/2018   Procedure: LEFT HEART CATH AND CORONARY ANGIOGRAPHY;  Surgeon: Dionisio David, MD;  Location: [place] [facility];  Service: Cardiovascular;  Laterality: Left;  Marland Kitchen MASTECTOMY Right 2012   Hx: of right breast, DCIS   . ORIF HUMERUS FRACTURE Left 02/23/2014   Procedure: OPEN REDUCTION INTERNAL FIXATION (ORIF) PROXIMAL HUMERUS FRACTURE;  Surgeon: Johnny Bridge, MD;  Location: [place];  Service: Orthopedics;  Laterality: Left;  . TUBAL LIGATION      Allergies Cymbalta [duloxetine hcl] and Toradol [ketorolac tromethamine]  Social History Social History   Tobacco Use  . Smoking status: Never Smoker  . Smokeless tobacco: Never Used  Substance Use Topics  . Alcohol use: No    Alcohol/week: 0.0 standard drinks  . Drug use: No   Review of Systems Constitutional: Negative for fever. Cardiovascular: Negative for chest pain. Respiratory: Negative for shortness of breath. Gastrointestinal: Negative for abdominal pain, vomiting and diarrhea. Musculoskeletal: Positive for right ankle pain and deformity Skin: Negative for rash. Neurological: Negative for headaches, positive for dizziness  All systems negative/normal/unremarkable except as stated in the HPI  ____________________________________________   PHYSICAL EXAM:  VITAL SIGNS: ED Triage Vitals  Enc Vitals Group     BP 02/15/19 1028 (!) 117/48     Pulse Rate 02/15/19 1025 77     Resp 02/15/19 1025 16     Temp 02/15/19 1026 98 F (36.7 C)     Temp Source  02/15/19 1025 Oral     SpO2 02/15/19 1025 98 %     Weight --      Height --      Head Circumference --      Peak Flow --      Pain Score 02/15/19 1026 0     Pain Loc --      Pain Edu? --      Excl. in [place]? --     Constitutional: Alert and oriented. Well appearing and in no distress. Cardiovascular: Normal rate, regular rhythm. No murmurs, rubs, or gallops. Respiratory: Normal respiratory effort without tachypnea nor retractions. Breath sounds are clear and equal  bilaterally. No wheezes/rales/rhonchi. Musculoskeletal: Right ankle and foot is obviously dislocated and everted.  Medially there is a palpable fracture.  Skin is intact with no open fracture component. Neurologic:  Normal speech and language. No gross focal neurologic deficits are appreciated.  Skin:  Skin is warm, dry and intact. No rash noted. Psychiatric: Mood and affect are normal. Speech and behavior are normal.  ____________________________________________  ED COURSE:  As part of my medical decision making, I reviewed the following data within the [place] History obtained from family if available, nursing notes, old chart and ekg, as well as notes from prior ED visits. Patient presented for right ankle fracture dislocation, we will assess with labs and imaging as indicated at this time.   Manson Passey Injury Treatment Date/Time: 02/15/2019 10:32 AM Performed by: Earleen Newport, MD Authorized by: Earleen Newport, MD   Consent:    Consent obtained:  Verbal   Consent given by:  PatientInjury location: ankle Location details: right ankle Injury type: dislocation Pre-procedure distal perfusion: normal Pre-procedure neurological function: normal Pre-procedure range of motion: reduced  Anesthesia: Local anesthesia used: no  Patient sedated: NoManipulation performed: yes Reduction method: direct traction Reduction successful: yes Immobilization: splint Splint type: short leg Post-procedure distal perfusion: normal Post-procedure neurological function: normal Post-procedure range of motion: improved  .Splint Application Date/Time: 04/12/6062 12:32 PM Performed by: Earleen Newport, MD Authorized by: Earleen Newport, MD   Procedure details:    Location:  Ankle   Cast type:  Short leg   Splint type:  Ankle stirrup   Supplies:  Ortho-Glass Post-procedure details:    Pain:  Improved   Sensation:  Normal   Patient tolerance of procedure:   Tolerated well, no immediate complications   ____________________________________________   RADIOLOGY Images reviewed by me Right ankle x-ray  IMPRESSION: Acute, displaced fractures involving the distal fibula and medial malleolus with apparent disruption of the ankle mortise. ____________________________________________   DIFFERENTIAL DIAGNOSIS   Fracture, dislocation, dehydration, electrolyte abnormality  FINAL ASSESSMENT AND PLAN  Right ankle fracture/dislocation   Plan: The patient had presented for fall with right ankle deformity. Patient's imaging did reveal fracture of the distal fibula and medial malleolus with disruption of the ankle mortise.  She was having some spasms in the lower leg.  Ankle had to be re-reduced which was successful and she was placed in a posterior and sugar tong splint.  I discussed with Dr. Marry Guan from the orthopedic service who arranged for outpatient orthopedic surgery.   Laurence Aly, MD    Note: This note was generated in part or whole with voice recognition software. Voice recognition is usually quite accurate but there are transcription errors that can and very often do occur. I apologize for any typographical errors that were not detected and corrected.  Earleen Newport, MD 02/15/19 [phone]

## 2019-02-22 ENCOUNTER — Other Ambulatory Visit: Payer: Self-pay

## 2019-02-22 ENCOUNTER — Encounter (HOSPITAL_COMMUNITY): Payer: Self-pay

## 2019-02-22 ENCOUNTER — Encounter (HOSPITAL_COMMUNITY)
Admission: RE | Admit: 2019-02-22 | Discharge: 2019-02-22 | Disposition: A | Discharge status: Home / Self Care | Payer: Medicare Other | Source: Ambulatory Visit | Attending: Orthopedic Surgery | Admitting: Orthopedic Surgery

## 2019-02-22 DIAGNOSIS — Z01812 Encounter for preprocedural laboratory examination: Secondary | ICD-10-CM | POA: Diagnosis not present

## 2019-02-22 LAB — CBC
HCT: 30.4 % — ABNORMAL LOW (ref 36.0–46.0)
Hemoglobin: 9.2 g/dL — ABNORMAL LOW (ref 12.0–15.0)
MCH: 26.3 pg (ref 26.0–34.0)
MCHC: 30.3 g/dL (ref 30.0–36.0)
MCV: 86.9 fL (ref 80.0–100.0)
Platelets: 290 10*3/uL (ref 150–400)
RBC: 3.5 MIL/uL — ABNORMAL LOW (ref 3.87–5.11)
RDW: 16.5 % — ABNORMAL HIGH (ref 11.5–15.5)
WBC: 10.4 10*3/uL (ref 4.0–10.5)
nRBC: 0 % (ref 0.0–0.2)

## 2019-02-22 LAB — BASIC METABOLIC PANEL
Anion gap: 12 (ref 5–15)
BUN: 35 mg/dL — ABNORMAL HIGH (ref 8–23)
CO2: 23 mmol/L (ref 22–32)
Calcium: 8.9 mg/dL (ref 8.9–10.3)
Chloride: 98 mmol/L (ref 98–111)
Creatinine, Ser: 1.58 mg/dL — ABNORMAL HIGH (ref 0.44–1.00)
GFR calc Af Amer: 38 mL/min — ABNORMAL LOW (ref >60)
GFR calc non Af Amer: 33 mL/min — ABNORMAL LOW (ref >60)
GLUCOSE: 224 mg/dL — AB (ref 70–99)
Potassium: 3.9 mmol/L (ref 3.5–5.1)
Sodium: 133 mmol/L — ABNORMAL LOW (ref 135–145)

## 2019-02-22 LAB — HEMOGLOBIN A1C
Hgb A1c MFr Bld: 7.8 % — ABNORMAL HIGH (ref 4.8–5.6)
Mean Plasma Glucose: 177.16 mg/dL

## 2019-02-22 LAB — GLUCOSE, CAPILLARY: Glucose-Capillary: 215 mg/dL — ABNORMAL HIGH (ref 70–99)

## 2019-02-22 NOTE — Progress Notes (Signed)
REcheck of blood pressure in left arm at end of preop was 100/56 in left arm and 111/52 in right arm.

## 2019-02-22 NOTE — Progress Notes (Addendum)
Anesthesia to Review:  Dr Humphrey Rolls- Cardiologist - [place]  Hx of MI with stents- 2007.  Have requested by fax LOV, ekg, stress and echo.   Patient reports last office visit on 02/13/2019.  Last dose of Plavix on 02/15/2019- per patient.   Audria Nine made aware at time of preop on 02/22/19- CXR- 02/09/2019- NO need to repeat per PA.  EKG-02/10/19-SR,RBBB Please note BMP and CBC results.  Already faxed to Dr Mardelle Matte and Audria Nine.

## 2019-02-22 NOTE — Patient Instructions (Addendum)
VERSIA MIGNOGNA  02/22/2019   Your procedure is scheduled on: 02/28/2019   Report to [facility] Main  Entrance  Report to admitting at   145pm    Call this number if you have problems the morning of surgery [phone]   Remember: Do not eat food  :After Midnight. BRUSH YOUR TEETH MORNING OF SURGERY AND RINSE YOUR MOUTH OUT, NO CHEWING GUM CANDY OR MINTS.  May have clear liquids from 46midnite until 0945am then nothing by mouth.    Take these medicines the morning of surgery with A SIP OF WATER: Clonazepam if needed, Gabapentin, Protonix, Entresto                                 You may not have any metal on your body including hair pins and              piercings  Do not wear jewelry, make-up, lotions, powders or perfumes, deodorant             Do not wear nail polish.  Do not shave  48 hours prior to surgery.                Do not bring valuables to the hospital. [place].  Contacts, dentures or bridgework may not be worn into surgery.      Patients discharged the day of surgery will not be allowed to drive home. IF YOU ARE HAVING SURGERY AND GOING HOME THE SAME DAY, YOU MUST HAVE AN ADULT TO DRIVE YOU HOME AND BE WITH YOU FOR 24 HOURS. YOU MAY GO HOME BY TAXI OR UBER OR ORTHERWISE, BUT AN ADULT MUST ACCOMPANY YOU HOME AND STAY WITH YOU FOR 24 HOURS.  Name and phone number of your driver:                 Please read over the following fact sheets you were given: _____________________________________________________________________             [facility] - Preparing for Surgery Before surgery, you can play an important role.  Because skin is not sterile, your skin needs to be as free of germs as possible.  You can reduce the number of germs on your skin by washing with CHG (chlorahexidine gluconate) soap before surgery.  CHG is an antiseptic cleaner which kills germs and bonds with the skin to  continue killing germs even after washing. Please DO NOT use if you have an allergy to CHG or antibacterial soaps.  If your skin becomes reddened/irritated stop using the CHG and inform your nurse when you arrive at Short Stay. Do not shave (including legs and underarms) for at least 48 hours prior to the first CHG shower.  You may shave your face/neck. Please follow these instructions carefully:  1.  Shower with CHG Soap the night before surgery and the  morning of Surgery.  2.  If you choose to wash your hair, wash your hair first as usual with your  normal  shampoo.  3.  After you shampoo, rinse your hair and body thoroughly to remove the  shampoo.  4.  Use CHG as you would any other liquid soap.  You can apply chg directly  to the skin and wash                       Gently with a scrungie or clean washcloth.  5.  Apply the CHG Soap to your body ONLY FROM THE NECK DOWN.   Do not use on face/ open                           Wound or open sores. Avoid contact with eyes, ears mouth and genitals (private parts).                       Wash face,  Genitals (private parts) with your normal soap.             6.  Wash thoroughly, paying special attention to the area where your surgery  will be performed.  7.  Thoroughly rinse your body with warm water from the neck down.  8.  DO NOT shower/wash with your normal soap after using and rinsing off  the CHG Soap.                9.  Pat yourself dry with a clean towel.            10.  Wear clean pajamas.            11.  Place clean sheets on your bed the night of your first shower and do not  sleep with pets. Day of Surgery : Do not apply any lotions/deodorants the morning of surgery.  Please wear clean clothes to the hospital/surgery center.  FAILURE TO FOLLOW THESE INSTRUCTIONS MAY RESULT IN THE CANCELLATION OF YOUR SURGERY PATIENT SIGNATURE_________________________________  NURSE  SIGNATURE__________________________________  ________________________________________________________________________    CLEAR LIQUID DIET   Foods Allowed                                                                     Foods Excluded  Coffee and tea, regular and decaf                             liquids that you cannot  Plain Jell-O in any flavor                                             see through such as: Fruit ices (not with fruit pulp)                                     milk, soups, orange juice  Iced Popsicles                                    All solid food Carbonated beverages, regular and diet  Cranberry, grape and apple juices Sports drinks like Gatorade Lightly seasoned clear broth or consume(fat free) Sugar, honey syrup  Sample Menu Breakfast                                Lunch                                     Supper Cranberry juice                    Beef broth                            Chicken broth Jell-O                                     Grape juice                           Apple juice Coffee or tea                        Jell-O                                      Popsicle                                                Coffee or tea                        Coffee or tea  _____________________________________________________________________

## 2019-02-23 NOTE — Progress Notes (Signed)
Please note info regarding clearance received on 02/23/2019 from Dr Humphrey Rolls ( cardiology) and Dr Melford Aase.  Placed on front of chart.

## 2019-02-23 NOTE — Progress Notes (Signed)
Anesthesia Chart Review   Case:  [id number] Date/Time:  02/28/19 1530   Procedure:  OPEN REDUCTION INTERNAL FIXATION (ORIF) ANKLE FRACTURE (Right )   Anesthesia type:  Choice   Pre-op diagnosis:  right ankle fracture   Location:  [place] [facility]   Surgeon:  Marchia Bond, MD      DISCUSSION: 68 yo never smoker with h/o PONV, CAD (MI s/p cardiac stent 2007), CHF, renal insufficiency, DM II, panic attacks, right ankle fracture scheduled for above procedure 02/28/19 with Dr. Marchia Bond.   Pt seen in ED 02/15/19 after becoming dizzy and falling, suffered right ankle fracture, scheduled for above surgery.  She was previously seen in the ED 02/09/19 due to shortness of breath.  Symptoms improved with increased Lasix and pt discharged.   Clearance received from cardiology 02/22/19.  Per clearance, "Stress test and echo were ordered for this week, but pt has cancelled appt.  She has symptomatic heart failure EF 25% NYHA class 3.  If emergent surgery required may stop plavix for 5 days, continue ASA.  Otherise, advise stress test and echo prior to surgery." At PAT visit pt without shortness of breath, asymptomatic.   Last seen by endocrinologist, Dr. Lucilla Lame, on 01/20/19.  At this visit Dr. Gabriel Carina reports diabetes control improved, yet not ideal; insulin increased. Clearance received from endocrinologist 02/21/2019.  Per clearance, "Please get medical (cardiac) clearance from PCP.  Regarding her diabetes, sugars are reasonably controlled and this should not delay surgery."   Pt on Plavix, reports last dose 02/15/19.   Discussed case with Dr. Christella Hartigan.  Pt will need stress test and echo ordered by cardiologist before proceeding with planned procedure.  Voicemail left with Dr. Luanna Cole scheduler.   VS: BP (!) 103/47 (BP Location: Left Arm) Comment: Notified RN  Pulse 74   Temp 36.7 C (Oral)   Resp 18   Ht 5\' 9"  (1.753 m)   SpO2 98%   BMI 32.49 kg/m   PROVIDERS: Perrin Maltese, MD is PCP    Lucilla Lame, MD is endocrinologist   Neoma Laming, MD is Cardiologist  LABS: Labs reviewed: Acceptable for surgery. (all labs ordered are listed, but only abnormal results are displayed)  Labs Reviewed  BASIC METABOLIC PANEL - Abnormal; Notable for the following components:      Result Value   Sodium 133 (*)    Glucose, Bld 224 (*)    BUN 35 (*)    Creatinine, Ser 1.58 (*)    GFR calc non Af Amer 33 (*)    GFR calc Af Amer 38 (*)    All other components within normal limits  HEMOGLOBIN A1C - Abnormal; Notable for the following components:   Hgb A1c MFr Bld 7.8 (*)    All other components within normal limits  GLUCOSE, CAPILLARY - Abnormal; Notable for the following components:   Glucose-Capillary 215 (*)    All other components within normal limits  CBC - Abnormal; Notable for the following components:   RBC 3.50 (*)    Hemoglobin 9.2 (*)    HCT 30.4 (*)    RDW 16.5 (*)    All other components within normal limits     IMAGES:   EKG: 02/09/2019 Rate 90 bpm Normal sinus rhythm  Possible left atrial enlargement Right bundle branch block Possible inferior infarct, age undetermined Anterolateral infarct, age undetermined Abnormal ecg  CV: Cardiac Cath 01/14/18  Prox LAD to Mid LAD lesion is 35% stenosed.  Mid  LAD to Dist LAD lesion is 60% stenosed.  Ost 1st Mrg to 1st Mrg lesion is 50% stenosed.  Mid Cx to Dist Cx lesion is 50% stenosed.   There is moderate disease in distal LAD and OM1 and stent in the mid LAD has no significant restenosis. LVEF is 25-30% with anteroapical dyskinesis. Past Medical History:  Diagnosis Date  . 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 CHF (congestive heart failure) ([place])   . Complication of anesthesia   . Coronary artery disease   . Diabetes mellitus without complication ([place])    type 2   . Fracture of humerus, proximal, left, closed 02/23/2014  . Myocardial infarct ([place])   . Neuropathic pain   . Panic attacks    Hx: of  . PONV  (postoperative nausea and vomiting)     Past Surgical History:  Procedure Laterality Date  . APPENDECTOMY    . BREAST BIOPSY Left 02/12/15   benign, clip placed  . BREAST SURGERY Right 2012   Right mastectomy with SLN, DCIS, grade 1-2. ER/PR not performed.   Marland Kitchen CARDIAC CATHETERIZATION    . cardiac stents  2007  . CHOLECYSTECTOMY    . COLON SURGERY     Subtotal colectomy for colonic inertia.  Marland Kitchen DILATION AND CURETTAGE OF UTERUS    . EYE SURGERY    . LEFT HEART CATH AND CORONARY ANGIOGRAPHY Left 01/14/2018   Procedure: LEFT HEART CATH AND CORONARY ANGIOGRAPHY;  Surgeon: Dionisio David, MD;  Location: [place] CV LAB;  Service: Cardiovascular;  Laterality: Left;  Marland Kitchen MASTECTOMY Right 2012   Hx: of right breast, DCIS   . ORIF HUMERUS FRACTURE Left 02/23/2014   Procedure: OPEN REDUCTION INTERNAL FIXATION (ORIF) PROXIMAL HUMERUS FRACTURE;  Surgeon: Johnny Bridge, MD;  Location: [place];  Service: Orthopedics;  Laterality: Left;  . TUBAL LIGATION      MEDICATIONS: . acetaminophen (TYLENOL) 500 MG tablet  . atorvastatin (LIPITOR) 40 MG tablet  . Cholecalciferol (VITAMIN D) 2000 units CAPS  . clonazePAM (KLONOPIN) 0.5 MG tablet  . clopidogrel (PLAVIX) 75 MG tablet  . cyclobenzaprine (FLEXERIL) 10 MG tablet  . diazepam (VALIUM) 5 MG tablet  . dicyclomine (BENTYL) 10 MG capsule  . diphenoxylate-atropine (LOMOTIL) 2.5-0.025 MG tablet  . furosemide (LASIX) 40 MG tablet  . gabapentin (NEURONTIN) 600 MG tablet  . insulin aspart (NOVOLOG) 100 UNIT/ML injection  . insulin glargine (LANTUS) 100 UNIT/ML injection  . metoCLOPramide (REGLAN) 10 MG tablet  . metolazone (ZAROXOLYN) 2.5 MG tablet  . ondansetron (ZOFRAN) 4 MG tablet  . oxyCODONE-acetaminophen (PERCOCET) 7.5-325 MG tablet  . pantoprazole (PROTONIX) 40 MG tablet  . sacubitril-valsartan (ENTRESTO) 24-26 MG  . traMADol (ULTRAM) 50 MG tablet   No current facility-administered medications for this encounter.     Maia Plan [facility] Pre-Surgical Testing [phone] 02/23/19 3:24 PM

## 2019-03-03 ENCOUNTER — Encounter (HOSPITAL_COMMUNITY): Payer: Self-pay | Admitting: *Deleted

## 2019-03-03 ENCOUNTER — Other Ambulatory Visit: Payer: Self-pay

## 2019-03-03 NOTE — Progress Notes (Signed)
Pt denies SOB and chest pain. Pt stated that she is under the care of Dr. Humphrey Rolls, Cardiology.Pt stated that her last dose of Plavix was Tuesday. Pt made aware to stop taking vitamins, fish oil and herbal medications. Do not take any NSAIDs ie: Ibuprofen, Advil, Naproxen (Aleve), Motrin, BC and Goody Powder.Pt made aware to take 30 units of Lantus at HS on Sunday and 30 units on DOS if blood glucose is > 70. Pt made aware that if BG is > 220 to take 1/2 of correction dose of insulin.Pt made aware to check BG every 2 hours prior to arrival to hospital on DOS. Pt made aware to treat a BG < 70 with 4 ounces of apple or cranberry juice, wait 15 minutes after intervention to recheck BG, if BG remains < 70, call Short Stay unit to speak with a nurse. Pt verbalized understanding of all pre-op instructions. PA, Anesthesiology, asked to review pt history; see note.

## 2019-03-03 NOTE — Anesthesia Preprocedure Evaluation (Addendum)
Anesthesia Evaluation  Patient identified by MRN, date of birth, ID band Patient awake    Reviewed: Allergy & Precautions, NPO status , Patient's Chart, lab work & pertinent test results  History of Anesthesia Complications (+) PONV  Airway Mallampati: II       Dental  (+) Poor Dentition   Pulmonary neg pulmonary ROS,    Pulmonary exam normal breath sounds clear to auscultation       Cardiovascular + CAD, + Past MI and +CHF   Rhythm:Regular Rate:Normal     Neuro/Psych PSYCHIATRIC DISORDERS Anxiety    GI/Hepatic Neg liver ROS, GERD  Medicated,  Endo/Other  diabetes, Poorly Controlled, Insulin Dependent  Renal/GU Renal InsufficiencyRenal disease  negative genitourinary   Musculoskeletal negative musculoskeletal ROS (+)   Abdominal (+) + obese,   Peds  Hematology   Anesthesia Other Findings   Reproductive/Obstetrics                            Anesthesia Physical Anesthesia Plan  ASA: III  Anesthesia Plan: General   Post-op Pain Management:    Induction: Intravenous  PONV Risk Score and Plan: 4 or greater and Ondansetron and Treatment may vary due to age or medical condition  Airway Management Planned: LMA  Additional Equipment:   Intra-op Plan:   Post-operative Plan: Extubation in OR  Informed Consent: I have reviewed the patients History and Physical, chart, labs and discussed the procedure including the risks, benefits and alternatives for the proposed anesthesia with the patient or authorized representative who has indicated his/her understanding and acceptance.     Dental advisory given  Plan Discussed with: CRNA  Anesthesia Plan Comments: (See note by Konrad Felix 02/22/19. Pt seen by Dr. Laurelyn Sickle 03/01/19 for nuclear stress and echo. Nuclear stress not completed, unable to obtain IV access. Echo 3/4 showed EF 25-30%, moderate AS - AVA 0.9 cm2 mean gradient 77mmHg. Per  note: "Class III risk, 10.1% 30-day risk of death, MI, or cardiac arrest. According to the 2016 CCS perioperative guidelines: If the RCRI is >1, the patient age is >65, the neck step is to measure the patient's NT-proBNP or BNP if this is available at your institution.  If the NT-proBNP is >300 ng/L or BNP is >92 ng/L, then there should be an EKG ordered in the PACU and troponin should be measured daily for 48 to 72 hours. If, after risk stratification, the NT-proBNP is <300 ng/L or BNP < 92 ng/L, no routine postoperative cardiac monitoring is warranted.  If the institution does not have these assays available, then all patients should be monitored with an EKG in the PACU and troponin measurements daily for 48 to 72 hours if they meet 1 of the following: RCRI >1, age >56.  The patient is cleared for ankle surgery from cardiac perspective with the above perioperative recommendations.  The BNP was not drawn today due to no venous access.  Stop Plavix 6 days prior to surgery, continue aspirin 81 mg."  Discussed with Nickola Major, scheduler at Dr. Luanna Cole office, she said he is aware he will be responsible for testing as recommended by cardiology. Claiborne Billings also said that per Dr. Mardelle Matte the pt is at risk of losing her foot if surgery not performed.  Reviewed case with Dr. Lissa Hoard. He recommended regional anesthesia with calf tourniquet. )   Anesthesia Quick Evaluation

## 2019-03-06 ENCOUNTER — Inpatient Hospital Stay (HOSPITAL_COMMUNITY): Payer: Medicare Other

## 2019-03-06 ENCOUNTER — Inpatient Hospital Stay (HOSPITAL_COMMUNITY)
Admission: RE | Admit: 2019-03-06 | Discharge: 2019-03-13 | DRG: 853 | Disposition: A | Discharge status: Home Health | Payer: Medicare Other | Attending: Internal Medicine | Admitting: Internal Medicine

## 2019-03-06 ENCOUNTER — Encounter: Payer: Medicare Other | Admitting: Anesthesiology

## 2019-03-06 ENCOUNTER — Encounter (HOSPITAL_COMMUNITY)
Admission: RE | Disposition: A | Discharge status: Home Health | Payer: Self-pay | Source: Home / Self Care | Attending: Orthopedic Surgery

## 2019-03-06 ENCOUNTER — Other Ambulatory Visit: Payer: Self-pay

## 2019-03-06 ENCOUNTER — Ambulatory Visit (HOSPITAL_COMMUNITY): Payer: Medicare Other | Admitting: Anesthesiology

## 2019-03-06 ENCOUNTER — Encounter (HOSPITAL_COMMUNITY): Payer: Self-pay

## 2019-03-06 ENCOUNTER — Ambulatory Visit (HOSPITAL_COMMUNITY): Payer: Medicare Other | Admitting: Physician Assistant

## 2019-03-06 DIAGNOSIS — M869 Osteomyelitis, unspecified: Secondary | ICD-10-CM | POA: Diagnosis not present

## 2019-03-06 DIAGNOSIS — Z9011 Acquired absence of right breast and nipple: Secondary | ICD-10-CM | POA: Diagnosis not present

## 2019-03-06 DIAGNOSIS — I959 Hypotension, unspecified: Secondary | ICD-10-CM | POA: Diagnosis not present

## 2019-03-06 DIAGNOSIS — G9340 Encephalopathy, unspecified: Secondary | ICD-10-CM | POA: Diagnosis present

## 2019-03-06 DIAGNOSIS — E876 Hypokalemia: Secondary | ICD-10-CM | POA: Diagnosis not present

## 2019-03-06 DIAGNOSIS — E669 Obesity, unspecified: Secondary | ICD-10-CM | POA: Diagnosis not present

## 2019-03-06 DIAGNOSIS — R6521 Severe sepsis with septic shock: Secondary | ICD-10-CM | POA: Diagnosis present

## 2019-03-06 DIAGNOSIS — R570 Cardiogenic shock: Secondary | ICD-10-CM | POA: Diagnosis not present

## 2019-03-06 DIAGNOSIS — E1142 Type 2 diabetes mellitus with diabetic polyneuropathy: Secondary | ICD-10-CM | POA: Diagnosis present

## 2019-03-06 DIAGNOSIS — G546 Phantom limb syndrome with pain: Secondary | ICD-10-CM | POA: Diagnosis not present

## 2019-03-06 DIAGNOSIS — E1143 Type 2 diabetes mellitus with diabetic autonomic (poly)neuropathy: Secondary | ICD-10-CM | POA: Diagnosis present

## 2019-03-06 DIAGNOSIS — A419 Sepsis, unspecified organism: Secondary | ICD-10-CM | POA: Diagnosis present

## 2019-03-06 DIAGNOSIS — Z452 Encounter for adjustment and management of vascular access device: Secondary | ICD-10-CM | POA: Diagnosis not present

## 2019-03-06 DIAGNOSIS — F410 Panic disorder [episodic paroxysmal anxiety] without agoraphobia: Secondary | ICD-10-CM | POA: Diagnosis present

## 2019-03-06 DIAGNOSIS — Z9981 Dependence on supplemental oxygen: Secondary | ICD-10-CM | POA: Diagnosis not present

## 2019-03-06 DIAGNOSIS — N179 Acute kidney failure, unspecified: Secondary | ICD-10-CM | POA: Diagnosis not present

## 2019-03-06 DIAGNOSIS — Z833 Family history of diabetes mellitus: Secondary | ICD-10-CM | POA: Diagnosis not present

## 2019-03-06 DIAGNOSIS — I255 Ischemic cardiomyopathy: Secondary | ICD-10-CM | POA: Diagnosis present

## 2019-03-06 DIAGNOSIS — M86171 Other acute osteomyelitis, right ankle and foot: Secondary | ICD-10-CM | POA: Diagnosis present

## 2019-03-06 DIAGNOSIS — E1165 Type 2 diabetes mellitus with hyperglycemia: Secondary | ICD-10-CM | POA: Diagnosis present

## 2019-03-06 DIAGNOSIS — Z9289 Personal history of other medical treatment: Secondary | ICD-10-CM

## 2019-03-06 DIAGNOSIS — Z4781 Encounter for orthopedic aftercare following surgical amputation: Secondary | ICD-10-CM | POA: Diagnosis present

## 2019-03-06 DIAGNOSIS — I214 Non-ST elevation (NSTEMI) myocardial infarction: Secondary | ICD-10-CM

## 2019-03-06 DIAGNOSIS — W19XXXA Unspecified fall, initial encounter: Secondary | ICD-10-CM | POA: Diagnosis present

## 2019-03-06 DIAGNOSIS — Z794 Long term (current) use of insulin: Secondary | ICD-10-CM | POA: Diagnosis not present

## 2019-03-06 DIAGNOSIS — I2583 Coronary atherosclerosis due to lipid rich plaque: Secondary | ICD-10-CM | POA: Diagnosis not present

## 2019-03-06 DIAGNOSIS — I4510 Unspecified right bundle-branch block: Secondary | ICD-10-CM | POA: Diagnosis present

## 2019-03-06 DIAGNOSIS — R7309 Other abnormal glucose: Secondary | ICD-10-CM | POA: Diagnosis not present

## 2019-03-06 DIAGNOSIS — K3184 Gastroparesis: Secondary | ICD-10-CM | POA: Diagnosis present

## 2019-03-06 DIAGNOSIS — I2510 Atherosclerotic heart disease of native coronary artery without angina pectoris: Secondary | ICD-10-CM | POA: Diagnosis not present

## 2019-03-06 DIAGNOSIS — Z7982 Long term (current) use of aspirin: Secondary | ICD-10-CM | POA: Diagnosis not present

## 2019-03-06 DIAGNOSIS — K219 Gastro-esophageal reflux disease without esophagitis: Secondary | ICD-10-CM | POA: Diagnosis present

## 2019-03-06 DIAGNOSIS — E119 Type 2 diabetes mellitus without complications: Secondary | ICD-10-CM

## 2019-03-06 DIAGNOSIS — E1169 Type 2 diabetes mellitus with other specified complication: Secondary | ICD-10-CM

## 2019-03-06 DIAGNOSIS — G8918 Other acute postprocedural pain: Secondary | ICD-10-CM | POA: Diagnosis not present

## 2019-03-06 DIAGNOSIS — I951 Orthostatic hypotension: Secondary | ICD-10-CM

## 2019-03-06 DIAGNOSIS — J810 Acute pulmonary edema: Secondary | ICD-10-CM | POA: Diagnosis not present

## 2019-03-06 DIAGNOSIS — E878 Other disorders of electrolyte and fluid balance, not elsewhere classified: Secondary | ICD-10-CM | POA: Diagnosis present

## 2019-03-06 DIAGNOSIS — Z8249 Family history of ischemic heart disease and other diseases of the circulatory system: Secondary | ICD-10-CM | POA: Diagnosis not present

## 2019-03-06 DIAGNOSIS — Z79899 Other long term (current) drug therapy: Secondary | ICD-10-CM

## 2019-03-06 DIAGNOSIS — S82209B Unspecified fracture of shaft of unspecified tibia, initial encounter for open fracture type I or II: Secondary | ICD-10-CM | POA: Diagnosis present

## 2019-03-06 DIAGNOSIS — F411 Generalized anxiety disorder: Secondary | ICD-10-CM | POA: Diagnosis not present

## 2019-03-06 DIAGNOSIS — Z89511 Acquired absence of right leg below knee: Secondary | ICD-10-CM | POA: Diagnosis not present

## 2019-03-06 DIAGNOSIS — I110 Hypertensive heart disease with heart failure: Secondary | ICD-10-CM | POA: Diagnosis present

## 2019-03-06 DIAGNOSIS — I340 Nonrheumatic mitral (valve) insufficiency: Secondary | ICD-10-CM | POA: Diagnosis not present

## 2019-03-06 DIAGNOSIS — I5043 Acute on chronic combined systolic (congestive) and diastolic (congestive) heart failure: Secondary | ICD-10-CM | POA: Diagnosis not present

## 2019-03-06 DIAGNOSIS — Z823 Family history of stroke: Secondary | ICD-10-CM | POA: Diagnosis not present

## 2019-03-06 DIAGNOSIS — R52 Pain, unspecified: Secondary | ICD-10-CM | POA: Diagnosis not present

## 2019-03-06 DIAGNOSIS — D638 Anemia in other chronic diseases classified elsewhere: Secondary | ICD-10-CM | POA: Diagnosis present

## 2019-03-06 DIAGNOSIS — Z853 Personal history of malignant neoplasm of breast: Secondary | ICD-10-CM

## 2019-03-06 DIAGNOSIS — Z6829 Body mass index (BMI) 29.0-29.9, adult: Secondary | ICD-10-CM

## 2019-03-06 DIAGNOSIS — S88111A Complete traumatic amputation at level between knee and ankle, right lower leg, initial encounter: Secondary | ICD-10-CM | POA: Diagnosis not present

## 2019-03-06 DIAGNOSIS — I5023 Acute on chronic systolic (congestive) heart failure: Secondary | ICD-10-CM

## 2019-03-06 DIAGNOSIS — Z972 Presence of dental prosthetic device (complete) (partial): Secondary | ICD-10-CM

## 2019-03-06 DIAGNOSIS — D62 Acute posthemorrhagic anemia: Secondary | ICD-10-CM | POA: Diagnosis present

## 2019-03-06 DIAGNOSIS — I739 Peripheral vascular disease, unspecified: Secondary | ICD-10-CM | POA: Diagnosis not present

## 2019-03-06 DIAGNOSIS — Z7902 Long term (current) use of antithrombotics/antiplatelets: Secondary | ICD-10-CM | POA: Diagnosis not present

## 2019-03-06 DIAGNOSIS — I252 Old myocardial infarction: Secondary | ICD-10-CM

## 2019-03-06 DIAGNOSIS — R0902 Hypoxemia: Secondary | ICD-10-CM | POA: Diagnosis not present

## 2019-03-06 DIAGNOSIS — I9589 Other hypotension: Secondary | ICD-10-CM | POA: Diagnosis not present

## 2019-03-06 DIAGNOSIS — R579 Shock, unspecified: Secondary | ICD-10-CM | POA: Diagnosis not present

## 2019-03-06 DIAGNOSIS — S8251XB Displaced fracture of medial malleolus of right tibia, initial encounter for open fracture type I or II: Secondary | ICD-10-CM | POA: Diagnosis present

## 2019-03-06 DIAGNOSIS — Z8781 Personal history of (healed) traumatic fracture: Secondary | ICD-10-CM

## 2019-03-06 DIAGNOSIS — D72828 Other elevated white blood cell count: Secondary | ICD-10-CM | POA: Diagnosis not present

## 2019-03-06 DIAGNOSIS — Z888 Allergy status to other drugs, medicaments and biological substances status: Secondary | ICD-10-CM

## 2019-03-06 DIAGNOSIS — Z955 Presence of coronary angioplasty implant and graft: Secondary | ICD-10-CM | POA: Diagnosis not present

## 2019-03-06 DIAGNOSIS — I9581 Postprocedural hypotension: Secondary | ICD-10-CM | POA: Diagnosis not present

## 2019-03-06 DIAGNOSIS — E871 Hypo-osmolality and hyponatremia: Secondary | ICD-10-CM | POA: Diagnosis present

## 2019-03-06 DIAGNOSIS — E785 Hyperlipidemia, unspecified: Secondary | ICD-10-CM | POA: Diagnosis present

## 2019-03-06 DIAGNOSIS — K5903 Drug induced constipation: Secondary | ICD-10-CM | POA: Diagnosis not present

## 2019-03-06 DIAGNOSIS — E162 Hypoglycemia, unspecified: Secondary | ICD-10-CM | POA: Diagnosis not present

## 2019-03-06 HISTORY — DX: Presence of dental prosthetic device (complete) (partial): Z97.2

## 2019-03-06 HISTORY — PX: ORIF ANKLE FRACTURE: SHX5408

## 2019-03-06 HISTORY — DX: Complete loss of teeth, unspecified cause, unspecified class: K08.109

## 2019-03-06 HISTORY — DX: Other injury of unspecified body region, initial encounter: T14.8XXA

## 2019-03-06 HISTORY — DX: Presence of spectacles and contact lenses: Z97.3

## 2019-03-06 HISTORY — DX: Complete traumatic amputation at level between knee and ankle, right lower leg, initial encounter: S88.111A

## 2019-03-06 LAB — CORTISOL: Cortisol, Plasma: 9.7 ug/dL

## 2019-03-06 LAB — GLUCOSE, CAPILLARY
Glucose-Capillary: 122 mg/dL — ABNORMAL HIGH (ref 70–99)
Glucose-Capillary: 126 mg/dL — ABNORMAL HIGH (ref 70–99)
Glucose-Capillary: 339 mg/dL — ABNORMAL HIGH (ref 70–99)

## 2019-03-06 LAB — CBC WITH DIFFERENTIAL/PLATELET
Abs Immature Granulocytes: 0.22 10*3/uL — ABNORMAL HIGH (ref 0.00–0.07)
Basophils Absolute: 0 10*3/uL (ref 0.0–0.1)
Basophils Relative: 0 %
Eosinophils Absolute: 0.1 10*3/uL (ref 0.0–0.5)
Eosinophils Relative: 0 %
HCT: 21.6 % — ABNORMAL LOW (ref 36.0–46.0)
HEMOGLOBIN: 6.4 g/dL — AB (ref 12.0–15.0)
Immature Granulocytes: 1 %
Lymphocytes Relative: 8 %
Lymphs Abs: 1.6 10*3/uL (ref 0.7–4.0)
MCH: 24.9 pg — ABNORMAL LOW (ref 26.0–34.0)
MCHC: 29.6 g/dL — ABNORMAL LOW (ref 30.0–36.0)
MCV: 84 fL (ref 80.0–100.0)
MONOS PCT: 2 %
Monocytes Absolute: 0.4 10*3/uL (ref 0.1–1.0)
Neutro Abs: 18.2 10*3/uL — ABNORMAL HIGH (ref 1.7–7.7)
Neutrophils Relative %: 89 %
Platelets: 364 10*3/uL (ref 150–400)
RBC: 2.57 MIL/uL — ABNORMAL LOW (ref 3.87–5.11)
RDW: 16.5 % — ABNORMAL HIGH (ref 11.5–15.5)
WBC: 20.5 10*3/uL — ABNORMAL HIGH (ref 4.0–10.5)
nRBC: 0 % (ref 0.0–0.2)

## 2019-03-06 LAB — CBC
HEMATOCRIT: 26.5 % — AB (ref 36.0–46.0)
Hemoglobin: 8.2 g/dL — ABNORMAL LOW (ref 12.0–15.0)
MCH: 25.9 pg — ABNORMAL LOW (ref 26.0–34.0)
MCHC: 30.9 g/dL (ref 30.0–36.0)
MCV: 83.9 fL (ref 80.0–100.0)
Platelets: 345 10*3/uL (ref 150–400)
RBC: 3.16 MIL/uL — ABNORMAL LOW (ref 3.87–5.11)
RDW: 16 % — ABNORMAL HIGH (ref 11.5–15.5)
WBC: 18.1 10*3/uL — AB (ref 4.0–10.5)
nRBC: 0 % (ref 0.0–0.2)

## 2019-03-06 LAB — BASIC METABOLIC PANEL
Anion gap: 10 (ref 5–15)
BUN: 22 mg/dL (ref 8–23)
CO2: 22 mmol/L (ref 22–32)
Calcium: 8.5 mg/dL — ABNORMAL LOW (ref 8.9–10.3)
Chloride: 103 mmol/L (ref 98–111)
Creatinine, Ser: 1.27 mg/dL — ABNORMAL HIGH (ref 0.44–1.00)
GFR calc Af Amer: 50 mL/min — ABNORMAL LOW (ref >60)
GFR calc non Af Amer: 43 mL/min — ABNORMAL LOW (ref >60)
GLUCOSE: 180 mg/dL — AB (ref 70–99)
POTASSIUM: 4.9 mmol/L (ref 3.5–5.1)
Sodium: 135 mmol/L (ref 135–145)

## 2019-03-06 LAB — TROPONIN I: Troponin I: 0.03 ng/mL (ref <0.03)

## 2019-03-06 LAB — MRSA PCR SCREENING: MRSA by PCR: NEGATIVE

## 2019-03-06 LAB — PREPARE RBC (CROSSMATCH)

## 2019-03-06 LAB — ABO/RH: ABO/RH(D): O POS

## 2019-03-06 LAB — CREATININE, SERUM
Creatinine, Ser: 1.29 mg/dL — ABNORMAL HIGH (ref 0.44–1.00)
GFR calc Af Amer: 49 mL/min — ABNORMAL LOW (ref >60)
GFR calc non Af Amer: 42 mL/min — ABNORMAL LOW (ref >60)

## 2019-03-06 LAB — PROCALCITONIN: Procalcitonin: <0.1 ng/mL

## 2019-03-06 SURGERY — OPEN REDUCTION INTERNAL FIXATION (ORIF) ANKLE FRACTURE
Anesthesia: General | Site: Foot | Laterality: Right

## 2019-03-06 MED ORDER — LIDOCAINE 2% (20 MG/ML) 5 ML SYRINGE
INTRAMUSCULAR | Status: AC
  Filled 2019-03-06: qty 5

## 2019-03-06 MED ORDER — LACTATED RINGERS IV SOLN
INTRAVENOUS | Status: DC
  Administered 2019-03-06: 14:00:00 via INTRAVENOUS

## 2019-03-06 MED ORDER — FENTANYL CITRATE (PF) 100 MCG/2ML IJ SOLN
INTRAMUSCULAR | Status: AC
  Administered 2019-03-06: 50 ug via INTRAVENOUS
  Filled 2019-03-06: qty 2

## 2019-03-06 MED ORDER — MIDAZOLAM HCL 2 MG/2ML IJ SOLN
1.0000 mg | Freq: Once | INTRAMUSCULAR | Status: AC
  Administered 2019-03-06: 1 mg via INTRAVENOUS

## 2019-03-06 MED ORDER — ALBUMIN HUMAN 5 % IV SOLN
INTRAVENOUS | Status: DC | PRN
  Administered 2019-03-06 (×2): via INTRAVENOUS

## 2019-03-06 MED ORDER — BISACODYL 10 MG RE SUPP: 10.0000 mg | Freq: Every day | RECTAL | Status: DC | PRN

## 2019-03-06 MED ORDER — METOCLOPRAMIDE HCL 10 MG PO TABS
10.0000 mg | ORAL_TABLET | Freq: Every day | ORAL | Status: DC
  Administered 2019-03-06 – 2019-03-12 (×7): 10 mg via ORAL
  Filled 2019-03-06 (×7): qty 1

## 2019-03-06 MED ORDER — SODIUM CHLORIDE 0.9% IV SOLUTION: Freq: Once | INTRAVENOUS | Status: DC

## 2019-03-06 MED ORDER — SODIUM CHLORIDE 0.9 % IV SOLN
250.0000 mL | INTRAVENOUS | Status: DC
  Administered 2019-03-06 – 2019-03-10 (×3): 250 mL via INTRAVENOUS

## 2019-03-06 MED ORDER — POLYETHYLENE GLYCOL 3350 17 G PO PACK
17.0000 g | PACK | Freq: Every day | ORAL | Status: DC | PRN
  Filled 2019-03-06: qty 1

## 2019-03-06 MED ORDER — SODIUM CHLORIDE 0.9 % IV SOLN
INTRAVENOUS | Status: DC | PRN
  Administered 2019-03-06: 25 ug/min via INTRAVENOUS

## 2019-03-06 MED ORDER — INSULIN GLARGINE 100 UNIT/ML ~~LOC~~ SOLN: 60.0000 [IU] | Freq: Two times a day (BID) | SUBCUTANEOUS | Status: DC

## 2019-03-06 MED ORDER — PANTOPRAZOLE SODIUM 40 MG IV SOLR: 40.0000 mg | INTRAVENOUS | Status: DC

## 2019-03-06 MED ORDER — GABAPENTIN 600 MG PO TABS: 600.0000 mg | ORAL_TABLET | Freq: Three times a day (TID) | ORAL | Status: DC

## 2019-03-06 MED ORDER — EPHEDRINE SULFATE-NACL 50-0.9 MG/10ML-% IV SOSY
PREFILLED_SYRINGE | INTRAVENOUS | Status: DC | PRN
  Administered 2019-03-06 (×4): 10 mg via INTRAVENOUS

## 2019-03-06 MED ORDER — MIDAZOLAM HCL 2 MG/2ML IJ SOLN
INTRAMUSCULAR | Status: AC
  Administered 2019-03-06: 1 mg via INTRAVENOUS
  Filled 2019-03-06: qty 2

## 2019-03-06 MED ORDER — DEXTROSE-NACL 5-0.45 % IV SOLN
INTRAVENOUS | Status: DC
  Administered 2019-03-06: 23:00:00 via INTRAVENOUS

## 2019-03-06 MED ORDER — 0.9 % SODIUM CHLORIDE (POUR BTL) OPTIME
TOPICAL | Status: DC | PRN
  Administered 2019-03-06: 1000 mL

## 2019-03-06 MED ORDER — ONDANSETRON HCL 4 MG/2ML IJ SOLN: 4.0000 mg | Freq: Four times a day (QID) | INTRAMUSCULAR | Status: DC | PRN

## 2019-03-06 MED ORDER — TRAMADOL HCL 50 MG PO TABS
50.0000 mg | ORAL_TABLET | Freq: Four times a day (QID) | ORAL | Status: DC
  Administered 2019-03-06 – 2019-03-07 (×2): 50 mg via ORAL
  Filled 2019-03-06 (×2): qty 1

## 2019-03-06 MED ORDER — EPHEDRINE 5 MG/ML INJ
INTRAVENOUS | Status: AC
  Filled 2019-03-06: qty 10

## 2019-03-06 MED ORDER — CEFAZOLIN SODIUM-DEXTROSE 2-4 GM/100ML-% IV SOLN: 2.0000 g | Freq: Four times a day (QID) | INTRAVENOUS | Status: DC

## 2019-03-06 MED ORDER — MEPERIDINE HCL 50 MG/ML IJ SOLN: 6.2500 mg | INTRAMUSCULAR | Status: DC | PRN

## 2019-03-06 MED ORDER — OXYCODONE HCL 5 MG PO TABS
10.0000 mg | ORAL_TABLET | ORAL | Status: DC | PRN
  Administered 2019-03-07: 10 mg via ORAL
  Filled 2019-03-06 (×2): qty 2

## 2019-03-06 MED ORDER — HYDROMORPHONE HCL 1 MG/ML IJ SOLN
0.5000 mg | INTRAMUSCULAR | Status: DC | PRN
  Administered 2019-03-08: 0.5 mg via INTRAVENOUS
  Filled 2019-03-06: qty 0.5

## 2019-03-06 MED ORDER — SODIUM CHLORIDE 0.45 % IV SOLN
INTRAVENOUS | Status: DC
  Administered 2019-03-06: via INTRAVENOUS

## 2019-03-06 MED ORDER — INSULIN ASPART 100 UNIT/ML ~~LOC~~ SOLN
0.0000 [IU] | SUBCUTANEOUS | Status: DC
  Administered 2019-03-06: 3 [IU] via SUBCUTANEOUS
  Administered 2019-03-06 – 2019-03-07 (×2): 7 [IU] via SUBCUTANEOUS

## 2019-03-06 MED ORDER — SODIUM CHLORIDE 0.9 % IR SOLN
Status: DC | PRN
  Administered 2019-03-06: 3000 mL

## 2019-03-06 MED ORDER — FENTANYL CITRATE (PF) 250 MCG/5ML IJ SOLN
INTRAMUSCULAR | Status: AC
  Filled 2019-03-06: qty 5

## 2019-03-06 MED ORDER — DEXAMETHASONE SODIUM PHOSPHATE 4 MG/ML IJ SOLN
INTRAMUSCULAR | Status: DC | PRN
  Administered 2019-03-06: 5 mg via INTRAVENOUS

## 2019-03-06 MED ORDER — SODIUM CHLORIDE 0.9 % IV SOLN
2.0000 g | Freq: Two times a day (BID) | INTRAVENOUS | Status: DC
  Administered 2019-03-07: 2 g via INTRAVENOUS
  Filled 2019-03-06 (×2): qty 2

## 2019-03-06 MED ORDER — METOCLOPRAMIDE HCL 5 MG PO TABS
5.0000 mg | ORAL_TABLET | Freq: Three times a day (TID) | ORAL | Status: DC | PRN
  Filled 2019-03-06: qty 2

## 2019-03-06 MED ORDER — FUROSEMIDE 40 MG PO TABS: 40.0000 mg | ORAL_TABLET | Freq: Every day | ORAL | Status: DC

## 2019-03-06 MED ORDER — PROMETHAZINE HCL 25 MG/ML IJ SOLN
INTRAMUSCULAR | Status: AC
  Administered 2019-03-06: 6.25 mg via INTRAVENOUS
  Filled 2019-03-06: qty 1

## 2019-03-06 MED ORDER — LIDOCAINE 2% (20 MG/ML) 5 ML SYRINGE
INTRAMUSCULAR | Status: DC | PRN
  Administered 2019-03-06: 100 mg via INTRAVENOUS

## 2019-03-06 MED ORDER — VANCOMYCIN HCL IN DEXTROSE 750-5 MG/150ML-% IV SOLN
750.0000 mg | Freq: Two times a day (BID) | INTRAVENOUS | Status: DC
  Filled 2019-03-06: qty 150

## 2019-03-06 MED ORDER — ONDANSETRON HCL 4 MG/2ML IJ SOLN
INTRAMUSCULAR | Status: AC
  Filled 2019-03-06: qty 2

## 2019-03-06 MED ORDER — BUPIVACAINE-EPINEPHRINE (PF) 0.5% -1:200000 IJ SOLN
INTRAMUSCULAR | Status: DC | PRN
  Administered 2019-03-06: 15 mL via PERINEURAL
  Administered 2019-03-06: 25 mL via PERINEURAL

## 2019-03-06 MED ORDER — DIPHENOXYLATE-ATROPINE 2.5-0.025 MG PO TABS: 1.0000 | ORAL_TABLET | Freq: Four times a day (QID) | ORAL | Status: DC | PRN

## 2019-03-06 MED ORDER — METOCLOPRAMIDE HCL 5 MG/ML IJ SOLN
5.0000 mg | Freq: Three times a day (TID) | INTRAMUSCULAR | Status: DC | PRN
  Filled 2019-03-06: qty 2

## 2019-03-06 MED ORDER — DEXAMETHASONE SODIUM PHOSPHATE 10 MG/ML IJ SOLN
INTRAMUSCULAR | Status: AC
  Filled 2019-03-06: qty 1

## 2019-03-06 MED ORDER — FENTANYL CITRATE (PF) 100 MCG/2ML IJ SOLN
INTRAMUSCULAR | Status: DC | PRN
  Administered 2019-03-06: 25 ug via INTRAVENOUS

## 2019-03-06 MED ORDER — METOLAZONE 2.5 MG PO TABS: 2.5000 mg | ORAL_TABLET | Freq: Every day | ORAL | Status: DC

## 2019-03-06 MED ORDER — ZOLPIDEM TARTRATE 5 MG PO TABS: 5.0000 mg | ORAL_TABLET | Freq: Every evening | ORAL | Status: DC | PRN

## 2019-03-06 MED ORDER — VANCOMYCIN HCL 10 G IV SOLR
2000.0000 mg | Freq: Once | INTRAVENOUS | Status: AC
  Administered 2019-03-06: 2000 mg via INTRAVENOUS
  Filled 2019-03-06 (×2): qty 2000

## 2019-03-06 MED ORDER — ENOXAPARIN SODIUM 40 MG/0.4ML ~~LOC~~ SOLN
40.0000 mg | SUBCUTANEOUS | Status: DC
  Administered 2019-03-07: 40 mg via SUBCUTANEOUS
  Filled 2019-03-06: qty 0.4

## 2019-03-06 MED ORDER — MAGNESIUM CITRATE PO SOLN
1.0000 | Freq: Once | ORAL | Status: DC | PRN
  Filled 2019-03-06: qty 296

## 2019-03-06 MED ORDER — ATORVASTATIN CALCIUM 40 MG PO TABS
40.0000 mg | ORAL_TABLET | Freq: Every day | ORAL | Status: DC
  Administered 2019-03-07 – 2019-03-12 (×6): 40 mg via ORAL
  Filled 2019-03-06 (×6): qty 1

## 2019-03-06 MED ORDER — DIAZEPAM 5 MG PO TABS
5.0000 mg | ORAL_TABLET | Freq: Three times a day (TID) | ORAL | Status: DC | PRN
  Administered 2019-03-06 – 2019-03-11 (×8): 5 mg via ORAL
  Filled 2019-03-06 (×8): qty 1

## 2019-03-06 MED ORDER — DOCUSATE SODIUM 100 MG PO CAPS
100.0000 mg | ORAL_CAPSULE | Freq: Two times a day (BID) | ORAL | Status: DC
  Administered 2019-03-07 – 2019-03-13 (×11): 100 mg via ORAL
  Filled 2019-03-06 (×14): qty 1

## 2019-03-06 MED ORDER — CLOPIDOGREL BISULFATE 75 MG PO TABS
75.0000 mg | ORAL_TABLET | Freq: Every day | ORAL | Status: DC
  Administered 2019-03-07: 75 mg via ORAL
  Filled 2019-03-06: qty 1

## 2019-03-06 MED ORDER — VITAMIN D 25 MCG (1000 UNIT) PO TABS
2000.0000 [IU] | ORAL_TABLET | Freq: Every day | ORAL | Status: DC
  Administered 2019-03-08 – 2019-03-13 (×6): 2000 [IU] via ORAL
  Filled 2019-03-06 (×6): qty 2

## 2019-03-06 MED ORDER — PROMETHAZINE HCL 25 MG/ML IJ SOLN
6.2500 mg | INTRAMUSCULAR | Status: DC | PRN
  Administered 2019-03-06: 6.25 mg via INTRAVENOUS

## 2019-03-06 MED ORDER — DIPHENHYDRAMINE HCL 12.5 MG/5ML PO ELIX
12.5000 mg | ORAL_SOLUTION | ORAL | Status: DC | PRN
  Filled 2019-03-06: qty 10

## 2019-03-06 MED ORDER — PROPOFOL 10 MG/ML IV BOLUS
INTRAVENOUS | Status: DC | PRN
  Administered 2019-03-06: 150 mg via INTRAVENOUS

## 2019-03-06 MED ORDER — CLONAZEPAM 0.5 MG PO TABS
0.5000 mg | ORAL_TABLET | Freq: Every day | ORAL | Status: DC | PRN
  Administered 2019-03-12: 0.5 mg via ORAL
  Filled 2019-03-06: qty 1

## 2019-03-06 MED ORDER — POTASSIUM CHLORIDE IN NACL 20-0.45 MEQ/L-% IV SOLN
INTRAVENOUS | Status: DC
  Filled 2019-03-06: qty 1000

## 2019-03-06 MED ORDER — ONDANSETRON HCL 4 MG PO TABS: 4.0000 mg | ORAL_TABLET | Freq: Four times a day (QID) | ORAL | Status: DC | PRN

## 2019-03-06 MED ORDER — HYDROMORPHONE HCL 1 MG/ML IJ SOLN
INTRAMUSCULAR | Status: AC
  Administered 2019-03-06: 0.5 mg via INTRAVENOUS
  Filled 2019-03-06: qty 1

## 2019-03-06 MED ORDER — CEFAZOLIN SODIUM-DEXTROSE 2-4 GM/100ML-% IV SOLN
INTRAVENOUS | Status: AC
  Filled 2019-03-06: qty 100

## 2019-03-06 MED ORDER — ACETAMINOPHEN 10 MG/ML IV SOLN: 1000.0000 mg | Freq: Once | INTRAVENOUS | Status: DC | PRN

## 2019-03-06 MED ORDER — CYCLOBENZAPRINE HCL 10 MG PO TABS: 10.0000 mg | ORAL_TABLET | Freq: Every day | ORAL | Status: DC

## 2019-03-06 MED ORDER — CEFAZOLIN SODIUM-DEXTROSE 2-4 GM/100ML-% IV SOLN
2.0000 g | Freq: Once | INTRAVENOUS | Status: AC
  Administered 2019-03-06: 2 g via INTRAVENOUS

## 2019-03-06 MED ORDER — PANTOPRAZOLE SODIUM 40 MG PO TBEC
40.0000 mg | DELAYED_RELEASE_TABLET | Freq: Every day | ORAL | Status: DC
  Administered 2019-03-07 – 2019-03-13 (×7): 40 mg via ORAL
  Filled 2019-03-06 (×7): qty 1

## 2019-03-06 MED ORDER — INSULIN ASPART 100 UNIT/ML ~~LOC~~ SOLN: 20.0000 [IU] | Freq: Three times a day (TID) | SUBCUTANEOUS | Status: DC

## 2019-03-06 MED ORDER — ONDANSETRON HCL 4 MG/2ML IJ SOLN
INTRAMUSCULAR | Status: DC | PRN
  Administered 2019-03-06: 4 mg via INTRAVENOUS

## 2019-03-06 MED ORDER — HYDROMORPHONE HCL 1 MG/ML IJ SOLN
0.2500 mg | INTRAMUSCULAR | Status: DC | PRN
  Administered 2019-03-06: 0.5 mg via INTRAVENOUS

## 2019-03-06 MED ORDER — CLONAZEPAM 0.125 MG PO TBDP
0.5000 mg | ORAL_TABLET | Freq: Two times a day (BID) | ORAL | Status: DC | PRN
  Administered 2019-03-13: 0.5 mg via ORAL
  Filled 2019-03-06: qty 4

## 2019-03-06 MED ORDER — INSULIN ASPART 100 UNIT/ML ~~LOC~~ SOLN
0.0000 [IU] | Freq: Three times a day (TID) | SUBCUTANEOUS | Status: DC
  Administered 2019-03-07 (×2): 8 [IU] via SUBCUTANEOUS
  Administered 2019-03-07: 11 [IU] via SUBCUTANEOUS

## 2019-03-06 MED ORDER — OXYCODONE HCL 5 MG PO TABS
5.0000 mg | ORAL_TABLET | ORAL | Status: DC | PRN
  Administered 2019-03-07 – 2019-03-08 (×3): 10 mg via ORAL
  Administered 2019-03-09 – 2019-03-13 (×8): 5 mg via ORAL
  Filled 2019-03-06: qty 2
  Filled 2019-03-06 (×4): qty 1
  Filled 2019-03-06: qty 2
  Filled 2019-03-06 (×4): qty 1
  Filled 2019-03-06: qty 2

## 2019-03-06 MED ORDER — SODIUM CHLORIDE 0.9 % IV SOLN
2.0000 g | Freq: Once | INTRAVENOUS | Status: AC
  Administered 2019-03-06: 2 g via INTRAVENOUS
  Filled 2019-03-06 (×2): qty 2

## 2019-03-06 MED ORDER — FENTANYL CITRATE (PF) 100 MCG/2ML IJ SOLN
50.0000 ug | Freq: Once | INTRAMUSCULAR | Status: AC
  Administered 2019-03-06: 50 ug via INTRAVENOUS

## 2019-03-06 MED ORDER — PHENYLEPHRINE 40 MCG/ML (10ML) SYRINGE FOR IV PUSH (FOR BLOOD PRESSURE SUPPORT)
PREFILLED_SYRINGE | INTRAVENOUS | Status: DC | PRN
  Administered 2019-03-06: 120 ug via INTRAVENOUS
  Administered 2019-03-06: 80 ug via INTRAVENOUS
  Administered 2019-03-06: 40 ug via INTRAVENOUS
  Administered 2019-03-06 (×2): 120 ug via INTRAVENOUS
  Administered 2019-03-06 (×4): 80 ug via INTRAVENOUS

## 2019-03-06 MED ORDER — PHENYLEPHRINE HCL-NACL 10-0.9 MG/250ML-% IV SOLN
25.0000 ug/min | INTRAVENOUS | Status: DC
  Administered 2019-03-06: 100 ug/min via INTRAVENOUS
  Administered 2019-03-06: 70 ug/min via INTRAVENOUS
  Administered 2019-03-07 (×2): 100 ug/min via INTRAVENOUS
  Administered 2019-03-07: 110 ug/min via INTRAVENOUS
  Administered 2019-03-07: 90 ug/min via INTRAVENOUS
  Administered 2019-03-07: 110 ug/min via INTRAVENOUS
  Filled 2019-03-06 (×7): qty 250

## 2019-03-06 MED ORDER — ACETAMINOPHEN 325 MG PO TABS
325.0000 mg | ORAL_TABLET | Freq: Four times a day (QID) | ORAL | Status: DC | PRN
  Administered 2019-03-08 (×2): 650 mg via ORAL
  Filled 2019-03-06 (×2): qty 2

## 2019-03-06 MED ORDER — PHENYLEPHRINE 40 MCG/ML (10ML) SYRINGE FOR IV PUSH (FOR BLOOD PRESSURE SUPPORT)
PREFILLED_SYRINGE | INTRAVENOUS | Status: AC
  Filled 2019-03-06: qty 20

## 2019-03-06 SURGICAL SUPPLY — 60 items
BANDAGE ACE 4X5 VEL STRL LF (GAUZE/BANDAGES/DRESSINGS) ×3 IMPLANT
BANDAGE ACE 6X5 VEL STRL LF (GAUZE/BANDAGES/DRESSINGS) ×3 IMPLANT
BANDAGE ESMARK 6X9 LF (GAUZE/BANDAGES/DRESSINGS) ×1 IMPLANT
BENZOIN TINCTURE PRP APPL 2/3 (GAUZE/BANDAGES/DRESSINGS) ×3 IMPLANT
BLADE SAW SAG 73X25 THK (BLADE) ×2
BLADE SAW SGTL 73X25 THK (BLADE) ×1 IMPLANT
BNDG ELASTIC 6X10 VLCR STRL LF (GAUZE/BANDAGES/DRESSINGS) ×3 IMPLANT
BNDG ESMARK 6X9 LF (GAUZE/BANDAGES/DRESSINGS) ×3
BOOTCOVER CLEANROOM LRG (PROTECTIVE WEAR) ×6 IMPLANT
CANISTER SUCT 3000ML PPV (MISCELLANEOUS) ×3 IMPLANT
CLOSURE STERI-STRIP 1/2X4 (GAUZE/BANDAGES/DRESSINGS) ×1
CLSR STERI-STRIP ANTIMIC 1/2X4 (GAUZE/BANDAGES/DRESSINGS) ×2 IMPLANT
COVER SURGICAL LIGHT HANDLE (MISCELLANEOUS) ×3 IMPLANT
COVER WAND RF STERILE (DRAPES) ×3 IMPLANT
CUFF TOURNIQUET SINGLE 34IN LL (TOURNIQUET CUFF) ×3 IMPLANT
DRAPE INCISE IOBAN 66X45 STRL (DRAPES) ×3 IMPLANT
DRSG PAD ABDOMINAL 8X10 ST (GAUZE/BANDAGES/DRESSINGS) ×6 IMPLANT
ELECT REM PT RETURN 9FT ADLT (ELECTROSURGICAL) ×3
ELECTRODE REM PT RTRN 9FT ADLT (ELECTROSURGICAL) ×1 IMPLANT
GAUZE SPONGE 4X4 12PLY STRL (GAUZE/BANDAGES/DRESSINGS) ×6 IMPLANT
GAUZE XEROFORM 1X8 LF (GAUZE/BANDAGES/DRESSINGS) ×6 IMPLANT
GAUZE XEROFORM 5X9 LF (GAUZE/BANDAGES/DRESSINGS) ×3 IMPLANT
GLOVE BIO SURGEON STRL SZ 6.5 (GLOVE) ×4 IMPLANT
GLOVE BIO SURGEONS STRL SZ 6.5 (GLOVE) ×2
GLOVE BIOGEL PI IND STRL 6.5 (GLOVE) ×1 IMPLANT
GLOVE BIOGEL PI INDICATOR 6.5 (GLOVE) ×2
GLOVE BIOGEL PI ORTHO PRO SZ8 (GLOVE) ×4
GLOVE ORTHO TXT STRL SZ7.5 (GLOVE) ×6 IMPLANT
GLOVE PI ORTHO PRO STRL SZ8 (GLOVE) ×2 IMPLANT
GLOVE SS BIOGEL STRL SZ 7 (GLOVE) ×1 IMPLANT
GLOVE SUPERSENSE BIOGEL SZ 7 (GLOVE) ×2
GLOVE SURG ORTHO 8.0 STRL STRW (GLOVE) ×6 IMPLANT
GOWN STRL REUS W/ TWL LRG LVL3 (GOWN DISPOSABLE) ×1 IMPLANT
GOWN STRL REUS W/ TWL XL LVL3 (GOWN DISPOSABLE) ×2 IMPLANT
GOWN STRL REUS W/TWL LRG LVL3 (GOWN DISPOSABLE) ×2
GOWN STRL REUS W/TWL XL LVL3 (GOWN DISPOSABLE) ×4
KIT BASIN OR (CUSTOM PROCEDURE TRAY) ×3 IMPLANT
KIT TURNOVER KIT B (KITS) ×3 IMPLANT
MANIFOLD NEPTUNE II (INSTRUMENTS) ×3 IMPLANT
NS IRRIG 1000ML POUR BTL (IV SOLUTION) ×3 IMPLANT
PACK ORTHO EXTREMITY (CUSTOM PROCEDURE TRAY) ×3 IMPLANT
PAD ARMBOARD 7.5X6 YLW CONV (MISCELLANEOUS) ×6 IMPLANT
PAD CAST 4YDX4 CTTN HI CHSV (CAST SUPPLIES) ×2 IMPLANT
PADDING CAST COTTON 4X4 STRL (CAST SUPPLIES) ×4
SCRUB POVIDONE IODINE 4 OZ (MISCELLANEOUS) ×3 IMPLANT
SET CYSTO W/LG BORE CLAMP LF (SET/KITS/TRAYS/PACK) ×3 IMPLANT
SOL PREP POV-IOD 4OZ 10% (MISCELLANEOUS) ×3 IMPLANT
SPONGE LAP 4X18 RFD (DISPOSABLE) ×3 IMPLANT
SUCTION FRAZIER HANDLE 10FR (MISCELLANEOUS) ×2
SUCTION TUBE FRAZIER 10FR DISP (MISCELLANEOUS) ×1 IMPLANT
SUT ETHILON 3 0 FSL (SUTURE) ×6 IMPLANT
SUT SILK 2 0 TIES 10X30 (SUTURE) ×3 IMPLANT
SUT VIC AB 0 CT1 27 (SUTURE) ×4
SUT VIC AB 0 CT1 27XBRD ANBCTR (SUTURE) ×2 IMPLANT
SYR CONTROL 10ML LL (SYRINGE) IMPLANT
TOWEL OR 17X26 10 PK STRL BLUE (TOWEL DISPOSABLE) ×3 IMPLANT
TUBE CONNECTING 12'X1/4 (SUCTIONS) ×2
TUBE CONNECTING 12X1/4 (SUCTIONS) ×4 IMPLANT
UNDERPAD 30X30 (UNDERPADS AND DIAPERS) ×3 IMPLANT
YANKAUER SUCT BULB TIP NO VENT (SUCTIONS) IMPLANT

## 2019-03-06 NOTE — H&P (Signed)
PREOPERATIVE H&P  Chief Complaint: right ankle fracture  HPI: [name] is a 68 y.o. female who presents for preoperative history and physical with a diagnosis of right ankle fracture.  She was initially seen in the emergency room by the emergency room physicians, referred to my office, in the office we reapplied a splint.  She has severe peripheral neuropathy.  Her husband has lost his leg many years ago due to severe diabetes, he is no longer living.  Her ankle has not had any pain, she states she has not been walking on it although there is significant damage to her splint on the plantar aspect.  She reports having had a fall approximately 3 days ago, but declined seeking medical care.  She also was not aware that she had bleeding coming through her splint.  She denies any fevers or chills.  Original injury was February 19, and in fact interaction was had with Dr. Marry Guan from [facility].    Past Medical History:  Diagnosis Date  . 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 CHF (congestive heart failure) ([place])   . Complication of anesthesia   . Coronary artery disease   . Diabetes mellitus without complication ([place])    type 2   . Fracture    right ankle  . Fracture of humerus, proximal, left, closed 02/23/2014  . Full dentures   . Myocardial infarct ([place])   . Neuropathic pain   . Panic attacks    Hx: of  . PONV (postoperative nausea and vomiting)   . Wears glasses    Past Surgical History:  Procedure Laterality Date  . APPENDECTOMY    . BREAST BIOPSY Left 02/12/15   benign, clip placed  . BREAST SURGERY Right 2012   Right mastectomy with SLN, DCIS, grade 1-2. ER/PR not performed.   [facility] CARDIAC CATHETERIZATION    . cardiac stents  2007  . CHOLECYSTECTOMY    . COLON SURGERY     Subtotal colectomy for colonic inertia.  . COLONOSCOPY    . DILATION AND CURETTAGE OF UTERUS    . EYE SURGERY    . LEFT HEART CATH AND CORONARY ANGIOGRAPHY Left 01/14/2018   Procedure: LEFT HEART CATH AND  CORONARY ANGIOGRAPHY;  Surgeon: Dionisio David, MD;  Location: [facility];  Service: Cardiovascular;  Laterality: Left;  [facility] MASTECTOMY Right 2012   Hx: of right breast, DCIS   . MULTIPLE TOOTH EXTRACTIONS    . ORIF HUMERUS FRACTURE Left 02/23/2014   Procedure: OPEN REDUCTION INTERNAL FIXATION (ORIF) PROXIMAL HUMERUS FRACTURE;  Surgeon: Johnny Bridge, MD;  Location: [place];  Service: Orthopedics;  Laterality: Left;  . TUBAL LIGATION     Social History   Socioeconomic History  . Marital status: Widowed    Spouse name: Not on file  . Number of children: Not on file  . Years of education: Not on file  . Highest education level: Not on file  Occupational History  . Not on file  Social Needs  . Financial resource strain: Not on file  . Food insecurity:    Worry: Not on file    Inability: Not on file  . Transportation needs:    Medical: Not on file    Non-medical: Not on file  Tobacco Use  . Smoking status: Never Smoker  . Smokeless tobacco: Never Used  Substance and Sexual Activity  . Alcohol use: No    Alcohol/week: 0.0 standard drinks  . Drug use: No  .  Sexual activity: Not Currently    Birth control/protection: Post-menopausal  Lifestyle  . Physical activity:    Days per week: Not on file    Minutes per session: Not on file  . Stress: Not on file  Relationships  . Social connections:    Talks on phone: Not on file    Gets together: Not on file    Attends religious service: Not on file    Active member of club or organization: Not on file    Attends meetings of clubs or organizations: Not on file    Relationship status: Not on file  Other Topics Concern  . Not on file  Social History Narrative  . Not on file   Family History  Problem Relation Age of Onset  . Diabetes Mother   . Hypertension Mother   . Stroke Mother   . Diabetes Brother   . Breast cancer Neg Hx    Allergies  Allergen Reactions  . Cymbalta [Duloxetine Hcl] Nausea And Vomiting and  Other (See Comments)    Vomiting and stiff muscles in legs  . Toradol [Ketorolac Tromethamine] Hives   Prior to Admission medications   Medication Sig Start Date End Date Taking? Authorizing Provider  acetaminophen (TYLENOL) 500 MG tablet Take 1,000 mg by mouth every 6 (six) hours as needed for moderate pain or headache.   Yes [provider]  atorvastatin (LIPITOR) 40 MG tablet Take 40 mg by mouth at bedtime.   Yes [provider]  Cholecalciferol (VITAMIN D) 2000 units CAPS Take 2,000 Units by mouth daily.    Yes [provider]  clonazePAM (KLONOPIN) 0.5 MG tablet Take 0.5 mg by mouth daily as needed for anxiety.  08/07/18  Yes [provider]  clopidogrel (PLAVIX) 75 MG tablet Take 75 mg by mouth daily with breakfast.   Yes [provider]  cyclobenzaprine (FLEXERIL) 10 MG tablet Take 1 tablet (10 mg total) by mouth 2 (two) times daily. Patient taking differently: Take 10 mg by mouth at bedtime.  07/17/15  Yes Molli Barrows, MD  diazepam (VALIUM) 5 MG tablet Take 1 tablet (5 mg total) by mouth every 8 (eight) hours as needed for muscle spasms. 02/15/19  Yes Earleen Newport, MD  diphenoxylate-atropine (LOMOTIL) 2.5-0.025 MG tablet Take 1 tablet by mouth 4 (four) times daily as needed for diarrhea or loose stools.  11/24/17  Yes [provider]  furosemide (LASIX) 40 MG tablet Take 40 mg by mouth daily. 01/27/19  Yes [provider]  gabapentin (NEURONTIN) 600 MG tablet Take 1 tablet (600 mg total) by mouth 3 (three) times daily. 07/17/15  Yes Molli Barrows, MD  insulin aspart (NOVOLOG) 100 UNIT/ML injection Inject 20-60 Units into the skin 3 (three) times daily before meals. Per sliding scale   Yes [provider]  insulin glargine (LANTUS) 100 UNIT/ML injection Inject 60 Units into the skin 2 (two) times daily.   Yes [provider]  metoCLOPramide (REGLAN) 10 MG tablet Take 10 mg by mouth at bedtime.    Yes  [provider]  metolazone (ZAROXOLYN) 2.5 MG tablet Take 2.5 mg by mouth daily. 02/13/19  Yes [provider]  oxyCODONE-acetaminophen (PERCOCET) 7.5-325 MG tablet Take 1 tablet by mouth every 4 (four) hours as needed for severe pain. 02/15/19 02/15/20 Yes Earleen Newport, MD  pantoprazole (PROTONIX) 40 MG tablet Take 40 mg by mouth daily.  10/01/18  Yes [provider]  sacubitril-valsartan (ENTRESTO) 24-26 MG Take  1 tablet by mouth 2 (two) times daily.    Yes [provider]  traMADol (ULTRAM) 50 MG tablet Take 2 tablets (100 mg total) by mouth every 8 (eight) hours. 11/28/18  Yes Molli Barrows, MD  dicyclomine (BENTYL) 10 MG capsule Take 1 capsule (10 mg total) by mouth 3 (three) times daily with meals as needed for spasms. Patient not taking: Reported on 02/21/2019 04/25/18   Arta Silence, MD  ondansetron [facility]) 4 MG tablet Take 4 mg by mouth every 8 (eight) hours as needed for nausea or vomiting.  01/07/18   [provider]     Positive ROS: All other systems have been reviewed and were otherwise negative with the exception of those mentioned in the HPI and as above.  Physical Exam: General: Alert, no acute distress Cardiovascular: Moderate bilateral pedal edema Respiratory: No cyanosis, no use of accessory musculature GI: No organomegaly, abdomen is soft and non-tender Skin: 8 cm open wound over the medial aspect of the distal tibia Neurologic: Sensation absent distally in the foot Psychiatric: Patient is competent for consent with normal mood and affect Lymphatic: No axillary or cervical lymphadenopathy  MUSCULOSKELETAL: There is an open wound over the distal medial malleolus with substantial exposed tibia with dislocation and drainage with foul odor and purulent material with necrotic subcutaneous tissue.  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  I do not believe that her limb is salvageable, this has been open probably for at least 3 days since her fall, and unfortunately she elected not to pursue medical evaluation at that time.  The risks benefits and alternatives were discussed with the patient including but not limited to the risks of nonoperative treatment, versus surgical intervention including infection, bleeding, nerve injury,  blood clots, cardiopulmonary complications, morbidity, mortality, inability to heal the stump, the need for future prosthetics, among others, and they were willing to proceed.    Johnny Bridge, MD Cell [phone]   03/06/2019 3:01 PM

## 2019-03-06 NOTE — Progress Notes (Signed)
eLink Physician-Brief Progress Note Patient Name: [name] DOB: 29-May-1951 MRN: [id number]   Date of Service  03/06/2019  HPI/Events of Note  Blood glucose = 339.  eICU Interventions  Will order: 1. D/C  D5 0.45 NaCl. 2. 0.45 NaCl to run IV at 75 mL/hour.      Intervention Category Major Interventions: Hyperglycemia - active titration of insulin therapy  [name] 03/06/2019, 11:34 PM

## 2019-03-06 NOTE — Anesthesia Postprocedure Evaluation (Signed)
Anesthesia Post Note  Patient: HENREITTA SPITTLER  Procedure(s) Performed: RIGHT BELOW KNEE AMPUTATION (Right Foot)     Patient location during evaluation: PACU Anesthesia Type: General Level of consciousness: awake and alert Pain management: pain level controlled Vital Signs Assessment: post-procedure vital signs reviewed and stable Respiratory status: spontaneous breathing, nonlabored ventilation, respiratory function stable and patient connected to nasal cannula oxygen Cardiovascular status: Improving but not stable enough for the floor. Needs ICU. Postop Assessment: no apparent nausea or vomiting Anesthetic complications: no Comments: Femoral/Popliteal block done in PACU due to pain. Consent obtained from patient and patient's daughter. Notes in the chart. BP has been low in PACU. Improving with PRBC's and phenylephrine gtt. Pt. Alert and oriented. Says BP drops low after every surgery. Labs pending. CCM on board. Pt to ICU for observation overnight.    Last Vitals:  Vitals:   03/06/19 1940 03/06/19 1950  BP: (!) 70/31 (!) 91/44  Pulse: (!) 103 (!) 107  Resp: (!) 24 20  Temp:  (!) 36.3 C  SpO2: 99% 99%    Last Pain:  Vitals:   03/06/19 1950  TempSrc: Temporal  PainSc:                  Matisyn Cabeza,W. EDMOND

## 2019-03-06 NOTE — Progress Notes (Signed)
No orders for consent or abx in epic. Attempted to call Dr. Mardelle Matte with no answer- sent text message, waiting on response.

## 2019-03-06 NOTE — Progress Notes (Signed)
Pharmacy Antibiotic Note  [name] is a 68 y.o. female admitted on 03/06/2019 for R BKA. Patient fell a month ago and has had poor wound healing, fell on it again and was found to have drainage and necrotic tissue. SCr 1.27. Starting empiric antibiotics.    Plan: -Vancomycin 750/12h -Cefepime 2g/12h -Monitor renal fx, cultures, VT as needed  Height: 5\' 9"  (175.3 cm) Weight: 215 lb (97.5 kg) IBW/kg (Calculated) : 66.2  Temp (24hrs), Avg:98.1 F (36.7 C), Min:98.1 F (36.7 C), Max:98.1 F (36.7 C)    Harvel Quale 03/06/2019 6:30 PM

## 2019-03-06 NOTE — Progress Notes (Signed)
eLink Physician-Brief Progress Note Patient Name: [name] DOB: 1951/03/13 MRN: [id number]   Date of Service  03/06/2019  HPI/Events of Note  Troponin #1 = 0.03. Patient is allergic to Toradol --> hives. Therefore, can't use ASA.   eICU Interventions  Continue to trend troponin.      Intervention Category Intermediate Interventions: Diagnostic test evaluation  Sommer,Steven Cornelia Copa 03/06/2019, 9:50 PM

## 2019-03-06 NOTE — Transfer of Care (Signed)
Immediate Anesthesia Transfer of Care Note  Patient: [name]  Procedure(s) Performed: RIGHT BELOW KNEE AMPUTATION (Right Foot)  Patient Location: PACU  Anesthesia Type:General  Level of Consciousness: awake and alert   Airway & Oxygen Therapy: Patient Spontanous Breathing and Patient connected to nasal cannula oxygen  Post-op Assessment: Report given to RN and VSS reviewed, hypotensive on arrival. Neo gtt restarted. Dr. Ola Spurr made aware, to bedside to start 2nd IV with ultrasound. Awaiting type and cross to transfuse 1 unit PRBC.  Post vital signs: Reviewed and stable  Last Vitals:  Vitals Value Taken Time  BP 84/42 03/06/2019  5:45 PM  Temp    Pulse 93 03/06/2019  5:49 PM  Resp 13 03/06/2019  5:49 PM  SpO2 100 % 03/06/2019  5:49 PM  Vitals shown include unvalidated device data.  Last Pain:  Vitals:   03/06/19 1151  TempSrc: Oral         Complications: No apparent anesthesia complications

## 2019-03-06 NOTE — Progress Notes (Signed)
Critical Hgb result given to primary nurse Richardson Landry, RN.

## 2019-03-06 NOTE — Progress Notes (Signed)
eLink Physician-Brief Progress Note Patient Name: [name] DOB: 05/07/1951 MRN: [id number]   Date of Service  03/06/2019  HPI/Events of Note  K+ = 4.9 - Patient has D5  0.45 + KCl running at 75 mL/hour.   eICU Interventions  Will order: 1. D/C D5 0.45 + KCl. 2. D5 0.45 IV fluid to run at 75 mL/hour.      Intervention Category Major Interventions: Electrolyte abnormality - evaluation and management  Sommer,Steven Eugene 03/06/2019, 9:59 PM

## 2019-03-06 NOTE — Op Note (Signed)
03/06/2019  4:46 PM  PATIENT:  [name]    PRE-OPERATIVE DIAGNOSIS: Infected right chronic open ankle fracture  POST-OPERATIVE DIAGNOSIS:  Same  PROCEDURE:  RIGHT BELOW KNEE AMPUTATION  SURGEON:  Johnny Bridge, MD  PHYSICIAN ASSISTANT: Joya Gaskins, OPA-C, present and scrubbed throughout the case, critical for completion in a timely fashion, and for retraction, instrumentation, and closure.  ANESTHESIA:   General  PREOPERATIVE INDICATIONS:  [name] is a  68 y.o. female with severe diabetes, absent distal sensation, who had an ankle fracture dislocation.  She underwent reduction in the emergency room, and then referred to my office.  She underwent preoperative optimization with cardiac work-up, she has poor ejection fracture and multiple coexisting comorbidities.  She had a fall apparently 3 or 4 days ago, and elected not to pursue medical evaluation.  She did not realize that she had in fact dislocated her ankle, and had her tibia protruding through the skin.  She also had bleeding coming through her splint, but did not realize this, and did not pursue medical evaluation.  Upon presentation to preoperative holding, she had an extremely foul odor to her ankle, with bleeding and saturation of her splint.  The tibia was exposed.  Given the entirety of the clinical picture I recommended below-knee amputation.  The risks benefits and alternatives were discussed with the patient preoperatively including but not limited to the risks of infection, bleeding, nerve injury, cardiopulmonary complications, the need for revision surgery, flap breakdown, among others, and the patient was willing to proceed.  ESTIMATED BLOOD LOSS: 750 ml  OPERATIVE IMPLANTS: None  OPERATIVE FINDINGS:       OPERATIVE PROCEDURE: The patient was brought to the operating room and placed in the supine position.  General anesthesia was administered.  IV antibiotics were given.  The right lower  extremity was prepped and draped in usual sterile fashion.  Timeout was performed.  The leg was elevated and exsanguinated and a tourniquet was inflated.  Total tourniquet time was approximately 40 minutes.  Incision was made allowing for a posterior flap to be advanced around anteriorly, with appropriate muscle flaps as well as skin flaps.  I used clamps to control bleeding as I progressed through the dissection.  The tibia was cut with a saw and beveled.  The fibula was also cut with a saw more proximal.  The tibia was at approximately 10 cm below the tibial tubercle.  After removal of the leg, dissection was carried out around the large vessels including the peroneal vessels, tibial vessels, saphenous vein, dorsalis pedis.  These were tied off using double silk ties.  The ends cauterized.  The tibial nerve resected sharply proximally.   I released the tourniquet and remove the clamps, and had excellent hemostasis.  I advanced the muscular flap from posteriorly around the anterior aspect and sewed over the tibia to the periosteum and anterior fascia.  This closed nicely.  I then had a skin closure that had excellent reapproximation without significant redundancy.  Sterile dressing was applied followed by a wrap and a posterior splint.  She was awakened and returned to the PACU in stable and satisfactory condition.  I will plan to likely transfuse 1 unit of packed red blood cells, consult internal medicine, and monitor her hemoglobin and hematocrit.  Johnny Bridge, MD

## 2019-03-06 NOTE — Anesthesia Procedure Notes (Signed)
Procedure Name: LMA Insertion Date/Time: 03/06/2019 3:28 PM Performed by: Candis Shine, CRNA Pre-anesthesia Checklist: Patient identified, Emergency Drugs available, Suction available and Patient being monitored Patient Re-evaluated:Patient Re-evaluated prior to induction Oxygen Delivery Method: Circle System Utilized Preoxygenation: Pre-oxygenation with 100% oxygen Induction Type: IV induction LMA: LMA inserted LMA Size: 4.0 Number of attempts: 1 Placement Confirmation: positive ETCO2 Tube secured with: Tape Dental Injury: Teeth and Oropharynx as per pre-operative assessment

## 2019-03-06 NOTE — Consult Note (Addendum)
NAME:  [name], MRN:  [id number], DOB:  05-18-1951, LOS: 0 ADMISSION DATE:  03/06/2019, CONSULTATION DATE:  03/06/2019 REFERRING MD:  Dr. Mardelle Matte, CHIEF COMPLAINT:  Hypotension  Brief History   75 yoF with systolic HF and severe diabetes s/p initial fall with R ankle fracture 2/19.  F/u w/ ortho and scheduled for ORIF but found to have significant damage to splint with bleeding and open tibia fracture with purulent drainage and necrotic tissue.  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  EBL ~700.  Extubated post op but was hypotensive requiring pressors.  Labs pending.   History of present illness   68 year old female with history of systolic heart failure (05/9449 EF 25-30%), CAD, DM, diabetic neuropathy, panic attacks, prior breast surgery s/p R mastectomy, gastroparesis who suffered initial R ankle fracture 2/19 after a fall.  Was seen outpatient by Ortho and splint reapplied.  Apparently had additional fall 3 days ago but did not seek medical care.  Had denied prior chills or fever.  Had originally planned for open reduction with internal fixation but was found with significant damage to her splint although she denies walking on it.  Found to have open wound over distal medial malleolus with exposed tibia with dislocation and drainage with foul odor and purulent material with necrotic subcutaneous tissue.  Limb unable to be salvaged.  Admitted and taken to OR by orthopedics for right BKA.  EBL ~700 ml.  Post operatively, patient extubated but remained hypotensive requiring pressors.  No post op labs available.  Unit of PRBC ordered. Patient to have nerve block in PACU for RLE pain.  Patient with limited poor peripheral access.  PCCM consulted for ICU admit.   Past Medical History  Systolic heart failure (02/8881 EF 25-30%), CAD, DM, diabetic neuropathy, panic attacks, prior breast surgery s/p R mastectomy, gastroparresis  Significant Hospital Events   2/19 initial R ankle fracture  Consults:    Procedures:  3/9 s/p R BKA  Significant Diagnostic Tests:   Micro Data:  3/9 BC x 2 >>  Antimicrobials:  3/9 ancef preop 3/9 vanc >> 3/9 cefepime >>  Interim history/subjective:   Objective   Blood pressure (!) 122/51, pulse 79, temperature 98.1 F (36.7 C), temperature source Oral, resp. rate 20, height 5\' 9"  (1.753 m), weight 97.5 kg, SpO2 100 %.        Intake/Output Summary (Last 24 hours) at 03/06/2019 1809 Last data filed at 03/06/2019 1716 Gross per 24 hour  Intake 1200 ml  Output 750 ml  Net 450 ml   Filed Weights   03/06/19 1151  Weight: 97.5 kg   Examination: General:  Older female lying in bed in NAD HEENT: MM pink/dry Neuro: Awake, oriented, MAE CV: RRR, no murmur PULM: even/non-labored, lungs bilaterally clear CM:KLKJ, non-tender, bs active  Extremities: cool/dry, R BKA with ACE wrap- CDI, LLE without edema Skin: no rashes, R arm restricted, prior R mastectomy   Resolved Hospital Problem list    Assessment & Plan:  Shock - septic vs hemorraghic, r/o cardiac process with CAD/ HFrPF hx P:  Admit to ICU Stat CBC (prior to PRBC transfusion), BMP, troponin, blood cultures, PCT, EKG, cortisol  Continue Neo for MAP > 65 Limit IVF with blood products with prior EF 25% Start empiric vanc and cefepime Trend fever curve / WBC May need central access given poor peripheral access CXR now  Monitor UOP  Open right ankle fracture s/p R BKA P:  Per Ortho Patient to  have nerve block in PACU for pain Dilaudid/ oxy per ortho, limit as able  DM P:  SSI sensitive for now, NPO CBG q4 May need to start levemir  Hx systolic HF, CAD, HLD P:  Hold home lasix, metolazone, and entresto Defer plavix restarting to ortho  Peripheral neuropathy P:  Hold Neurontin, flexeril, diazepam  GERD, gastroparesis P:  Continue protonix  Hold bentyl  Best practice:  Diet: NPO Pain/Anxiety/Delirium protocol (if indicated): n/a VAP protocol (if indicated): n/a DVT  prophylaxis: SQ heparin GI prophylaxis: PPI Glucose control: SSI Mobility: BR Code Status: Full Family Communication: no family at bedside Disposition: ICU  Labs   CBC: No results for input(s): WBC, NEUTROABS, HGB, HCT, MCV, PLT in the last 168 hours.  Basic Metabolic Panel: No results for input(s): NA, K, CL, CO2, GLUCOSE, BUN, CREATININE, CALCIUM, MG, PHOS in the last 168 hours. GFR: Estimated Creatinine Clearance: 41.8 mL/min (A) (by C-G formula based on SCr of 1.58 mg/dL (H)). No results for input(s): PROCALCITON, WBC, LATICACIDVEN in the last 168 hours.  Liver Function Tests: No results for input(s): AST, ALT, ALKPHOS, BILITOT, PROT, ALBUMIN in the last 168 hours. No results for input(s): LIPASE, AMYLASE in the last 168 hours. No results for input(s): AMMONIA in the last 168 hours.  ABG    Component Value Date/Time   TCO2 21 10/14/2013 1513     Coagulation Profile: No results for input(s): INR, PROTIME in the last 168 hours.  Cardiac Enzymes: No results for input(s): CKTOTAL, CKMB, CKMBINDEX, TROPONINI in the last 168 hours.  HbA1C: Hgb A1c MFr Bld  Date/Time Value Ref Range Status  02/22/2019 11:51 AM 7.8 (H) 4.8 - 5.6 % Final    Comment:    (NOTE) Pre diabetes:          5.7%-6.4% Diabetes:              >6.4% Glycemic control for   <7.0% adults with diabetes     CBG: Recent Labs  Lab 03/06/19 1150 03/06/19 1720  GLUCAP 122* 126*    Review of Systems:   Unable  Past Medical History  She,  has a past medical history of Below-knee amputation of right lower extremity ([place]) (03/06/2019), Cancer ([place]) (2012), CHF (congestive heart failure) ([place]), Complication of anesthesia, Coronary artery disease, Diabetes mellitus without complication ([place]), Fracture, Fracture of humerus, proximal, left, closed (02/23/2014), Full dentures, Myocardial infarct ([place]), Neuropathic pain, Panic attacks, PONV (postoperative nausea and vomiting), and Wears glasses.   Surgical  History    Past Surgical History:  Procedure Laterality Date  . APPENDECTOMY    . BREAST BIOPSY Left 02/12/15   benign, clip placed  . BREAST SURGERY Right 2012   Right mastectomy with SLN, DCIS, grade 1-2. ER/PR not performed.   Marland Kitchen CARDIAC CATHETERIZATION    . cardiac stents  2007  . CHOLECYSTECTOMY    . COLON SURGERY     Subtotal colectomy for colonic inertia.  . COLONOSCOPY    . DILATION AND CURETTAGE OF UTERUS    . EYE SURGERY    . LEFT HEART CATH AND CORONARY ANGIOGRAPHY Left 01/14/2018   Procedure: LEFT HEART CATH AND CORONARY ANGIOGRAPHY;  Surgeon: Dionisio David, MD;  Location: [facility];  Service: Cardiovascular;  Laterality: Left;  Marland Kitchen MASTECTOMY Right 2012   Hx: of right breast, DCIS   . MULTIPLE TOOTH EXTRACTIONS    . ORIF HUMERUS FRACTURE Left 02/23/2014   Procedure: OPEN REDUCTION INTERNAL FIXATION (ORIF) PROXIMAL HUMERUS FRACTURE;  Surgeon: Johnny Bridge, MD;  Location: [facility];  Service: Orthopedics;  Laterality: Left;  . TUBAL LIGATION       Social History   reports that she has never smoked. She has never used smokeless tobacco. She reports that she does not drink alcohol or use drugs.   Family History   Her family history includes Diabetes in her brother and mother; Hypertension in her mother; Stroke in her mother. There is no history of Breast cancer.   Allergies Allergies  Allergen Reactions  . Cymbalta [Duloxetine Hcl] Nausea And Vomiting and Other (See Comments)    Vomiting and stiff muscles in legs  . Toradol [Ketorolac Tromethamine] Hives     Home Medications  Prior to Admission medications   Medication Sig Start Date End Date Taking? Authorizing Provider  acetaminophen (TYLENOL) 500 MG tablet Take 1,000 mg by mouth every 6 (six) hours as needed for moderate pain or headache.   Yes [provider]  atorvastatin (LIPITOR) 40 MG tablet Take 40 mg by mouth at bedtime.   Yes [provider]  Cholecalciferol (VITAMIN D) 2000  units CAPS Take 2,000 Units by mouth daily.    Yes [provider]  clonazePAM (KLONOPIN) 0.5 MG tablet Take 0.5 mg by mouth daily as needed for anxiety.  08/07/18  Yes [provider]  clopidogrel (PLAVIX) 75 MG tablet Take 75 mg by mouth daily with breakfast.   Yes [provider]  cyclobenzaprine (FLEXERIL) 10 MG tablet Take 1 tablet (10 mg total) by mouth 2 (two) times daily. Patient taking differently: Take 10 mg by mouth at bedtime.  07/17/15  Yes Molli Barrows, MD  diazepam (VALIUM) 5 MG tablet Take 1 tablet (5 mg total) by mouth every 8 (eight) hours as needed for muscle spasms. 02/15/19  Yes Earleen Newport, MD  diphenoxylate-atropine (LOMOTIL) 2.5-0.025 MG tablet Take 1 tablet by mouth 4 (four) times daily as needed for diarrhea or loose stools.  11/24/17  Yes [provider]  furosemide (LASIX) 40 MG tablet Take 40 mg by mouth daily. 01/27/19  Yes [provider]  gabapentin (NEURONTIN) 600 MG tablet Take 1 tablet (600 mg total) by mouth 3 (three) times daily. 07/17/15  Yes Molli Barrows, MD  insulin aspart (NOVOLOG) 100 UNIT/ML injection Inject 20-60 Units into the skin 3 (three) times daily before meals. Per sliding scale   Yes [provider]  insulin glargine (LANTUS) 100 UNIT/ML injection Inject 60 Units into the skin 2 (two) times daily.   Yes [provider]  metoCLOPramide (REGLAN) 10 MG tablet Take 10 mg by mouth at bedtime.    Yes [provider]  metolazone (ZAROXOLYN) 2.5 MG tablet Take 2.5 mg by mouth daily. 02/13/19  Yes [provider]  oxyCODONE-acetaminophen (PERCOCET) 7.5-325 MG tablet Take 1 tablet by mouth every 4 (four) hours as needed for severe pain. 02/15/19 02/15/20 Yes Earleen Newport, MD  pantoprazole (PROTONIX) 40 MG tablet Take 40 mg by mouth daily.  10/01/18  Yes [provider]  sacubitril-valsartan (ENTRESTO) 24-26 MG Take 1 tablet by mouth 2 (two) times daily.    Yes  [provider]  traMADol (ULTRAM) 50 MG tablet Take 2 tablets (100 mg total) by mouth every 8 (eight) hours. 11/28/18  Yes Molli Barrows, MD  dicyclomine (BENTYL) 10 MG capsule Take 1 capsule (10 mg total) by mouth 3 (three) times daily with meals as needed for spasms. Patient not taking: Reported on 02/21/2019  04/25/18   Arta Silence, MD  ondansetron (ZOFRAN) 4 MG tablet Take 4 mg by mouth every 8 (eight) hours as needed for nausea or vomiting.  01/07/18   [provider]     Critical care time: 59 min    Kennieth Rad, MSN, AGACNP-BC [facility] Pgr: [phone] or if no answer [phone] 03/06/2019, 6:36 PM  Attending Note:  68 year old female with open fracture of her right ankle who presents to PCCM from the PACU for requiring pressors for BP support.  Patient had an amputation of the right leg since patient was walking with tibia protruding through.  On exam, lungs are clear.  With a clear surgical site.  Patient is in the exam bed in some painful distress from her leg and the BP cuff is on her left leg.  No labs ordered.  Will order a set of labs, CXR, pan culture, check PCT and cortisol level, start stress dose steroids, cefepime and vancomycin per pharmacy, f/u on cultures, troponins ordered, EKG ordered, KVO IVF, transfuse a unit of blood, check coags, admit to the ICU for closer observation and anesthesia will perform a nerve block for leg pain.  PCCM will consult.  The patient is critically ill with multiple organ systems failure and requires high complexity decision making for assessment and support, frequent evaluation and titration of therapies, application of advanced monitoring technologies and extensive interpretation of multiple databases.   Critical Care Time devoted to patient care services described in this note is  45  Minutes. This time reflects time of care of this signee Dr Jennet Maduro. This critical care time does not reflect  procedure time, or teaching time or supervisory time of PA/NP/Med student/Med Resident etc but could involve care discussion time.  Rush Farmer, M.D. [facility] Pulmonary/Critical Care Medicine. Pager: [phone]. After hours pager: [phone].

## 2019-03-06 NOTE — Progress Notes (Signed)
CRITICAL VALUE ALERT  Critical Value:  Troponin 0.03  Date & Time Notied: 03/06/19 8:26 PM  Provider Notified: elink  Orders Received/Actions taken: no new orders at this time

## 2019-03-06 NOTE — Anesthesia Procedure Notes (Signed)
Anesthesia Regional Block: Popliteal block   Pre-Anesthetic Checklist: ,, timeout performed, Correct Patient, Correct Site, Correct Laterality, Correct Procedure, Correct Position, site marked, Risks and benefits discussed, pre-op evaluation,  At surgeon's request and post-op pain management  Laterality: Right  Prep: Maximum Sterile Barrier Precautions used, chloraprep       Needles:  Injection technique: Single-shot  Needle Type: Echogenic Stimulator Needle     Needle Length: 9cm  Needle Gauge: 21     Additional Needles:   Procedures:,,,, ultrasound used (permanent image in chart),,,,  Narrative:  Start time: 03/06/2019 6:41 PM End time: 03/06/2019 6:51 PM Injection made incrementally with aspirations every 5 mL. Anesthesiologist: Roderic Palau, MD  Additional Notes: 2% Lidocaine skin wheel.

## 2019-03-06 NOTE — Progress Notes (Signed)
Attempted to contact Angelena Sole, PA for consent order no answer. Called Sherri Dr. Luanna Cole scheduler regarding same and was connected to Dr. Mardelle Matte.

## 2019-03-06 NOTE — Anesthesia Procedure Notes (Signed)
Anesthesia Regional Block: Femoral nerve block   Pre-Anesthetic Checklist: ,, timeout performed, Correct Patient, Correct Site, Correct Laterality, Correct Procedure, Correct Position, site marked, Risks and benefits discussed, pre-op evaluation,  At surgeon's request and post-op pain management  Laterality: Right  Prep: Maximum Sterile Barrier Precautions used, chloraprep       Needles:  Injection technique: Single-shot  Needle Type: Echogenic Stimulator Needle     Needle Length: 5cm  Needle Gauge: 22     Additional Needles:   Procedures:,,,, ultrasound used (permanent image in chart),,,,  Narrative:  Start time: 03/06/2019 6:51 PM End time: 03/06/2019 7:00 PM Injection made incrementally with aspirations every 5 mL. Anesthesiologist: Roderic Palau, MD  Additional Notes: 2% Lidocaine skin wheel.

## 2019-03-07 ENCOUNTER — Inpatient Hospital Stay (HOSPITAL_COMMUNITY): Payer: Medicare Other

## 2019-03-07 ENCOUNTER — Other Ambulatory Visit (HOSPITAL_COMMUNITY): Payer: Medicare Other

## 2019-03-07 ENCOUNTER — Encounter (HOSPITAL_COMMUNITY): Payer: Self-pay | Admitting: Orthopedic Surgery

## 2019-03-07 DIAGNOSIS — I2583 Coronary atherosclerosis due to lipid rich plaque: Secondary | ICD-10-CM

## 2019-03-07 DIAGNOSIS — I340 Nonrheumatic mitral (valve) insufficiency: Secondary | ICD-10-CM

## 2019-03-07 DIAGNOSIS — I255 Ischemic cardiomyopathy: Secondary | ICD-10-CM

## 2019-03-07 DIAGNOSIS — I2510 Atherosclerotic heart disease of native coronary artery without angina pectoris: Secondary | ICD-10-CM

## 2019-03-07 LAB — GLUCOSE, CAPILLARY
Glucose-Capillary: 220 mg/dL — ABNORMAL HIGH (ref 70–99)
Glucose-Capillary: 311 mg/dL — ABNORMAL HIGH (ref 70–99)
Glucose-Capillary: 258 mg/dL — ABNORMAL HIGH (ref 70–99)
Glucose-Capillary: 319 mg/dL — ABNORMAL HIGH (ref 70–99)
Glucose-Capillary: 247 mg/dL — ABNORMAL HIGH (ref 70–99)
Glucose-Capillary: 254 mg/dL — ABNORMAL HIGH (ref 70–99)
Glucose-Capillary: 240 mg/dL — ABNORMAL HIGH (ref 70–99)

## 2019-03-07 LAB — CORTISOL: Cortisol, Plasma: 3.1 ug/dL

## 2019-03-07 LAB — CBC
HCT: 29.4 % — ABNORMAL LOW (ref 36.0–46.0)
HCT: 22.6 % — ABNORMAL LOW (ref 36.0–46.0)
Hemoglobin: 8.8 g/dL — ABNORMAL LOW (ref 12.0–15.0)
Hemoglobin: 6.9 g/dL — CL (ref 12.0–15.0)
MCH: 25.7 pg — ABNORMAL LOW (ref 26.0–34.0)
MCH: 25.7 pg — ABNORMAL LOW (ref 26.0–34.0)
MCHC: 29.9 g/dL — ABNORMAL LOW (ref 30.0–36.0)
MCHC: 30.5 g/dL (ref 30.0–36.0)
MCV: 85.7 fL (ref 80.0–100.0)
MCV: 84.3 fL (ref 80.0–100.0)
NRBC: 0 % (ref 0.0–0.2)
Platelets: 403 10*3/uL — ABNORMAL HIGH (ref 150–400)
Platelets: 251 10*3/uL (ref 150–400)
RBC: 3.43 MIL/uL — ABNORMAL LOW (ref 3.87–5.11)
RBC: 2.68 MIL/uL — ABNORMAL LOW (ref 3.87–5.11)
RDW: 16.3 % — ABNORMAL HIGH (ref 11.5–15.5)
RDW: 16.5 % — ABNORMAL HIGH (ref 11.5–15.5)
WBC: 23.5 10*3/uL — ABNORMAL HIGH (ref 4.0–10.5)
WBC: 14.9 10*3/uL — ABNORMAL HIGH (ref 4.0–10.5)
nRBC: 0 % (ref 0.0–0.2)

## 2019-03-07 LAB — BASIC METABOLIC PANEL
Anion gap: 12 (ref 5–15)
Anion gap: 10 (ref 5–15)
BUN: 26 mg/dL — ABNORMAL HIGH (ref 8–23)
BUN: 34 mg/dL — ABNORMAL HIGH (ref 8–23)
CALCIUM: 7.7 mg/dL — AB (ref 8.9–10.3)
CHLORIDE: 104 mmol/L (ref 98–111)
CO2: 17 mmol/L — ABNORMAL LOW (ref 22–32)
CO2: 18 mmol/L — ABNORMAL LOW (ref 22–32)
CREATININE: 1.45 mg/dL — AB (ref 0.44–1.00)
Calcium: 8.1 mg/dL — ABNORMAL LOW (ref 8.9–10.3)
Chloride: 104 mmol/L (ref 98–111)
Creatinine, Ser: 1.82 mg/dL — ABNORMAL HIGH (ref 0.44–1.00)
GFR calc Af Amer: 42 mL/min — ABNORMAL LOW (ref >60)
GFR calc non Af Amer: 37 mL/min — ABNORMAL LOW (ref >60)
GFR calc non Af Amer: 28 mL/min — ABNORMAL LOW (ref >60)
GFR, EST AFRICAN AMERICAN: 32 mL/min — AB (ref >60)
Glucose, Bld: 293 mg/dL — ABNORMAL HIGH (ref 70–99)
Glucose, Bld: 275 mg/dL — ABNORMAL HIGH (ref 70–99)
Potassium: 6 mmol/L — ABNORMAL HIGH (ref 3.5–5.1)
Potassium: 5.4 mmol/L — ABNORMAL HIGH (ref 3.5–5.1)
SODIUM: 132 mmol/L — AB (ref 135–145)
Sodium: 133 mmol/L — ABNORMAL LOW (ref 135–145)

## 2019-03-07 LAB — TROPONIN I
Troponin I: 2.08 ng/mL (ref <0.03)
Troponin I: 23.21 ng/mL (ref <0.03)
Troponin I: 29.49 ng/mL (ref <0.03)

## 2019-03-07 LAB — APTT: aPTT: 119 seconds — ABNORMAL HIGH (ref 24–36)

## 2019-03-07 LAB — HEMOGLOBIN AND HEMATOCRIT, BLOOD
HCT: 26.6 % — ABNORMAL LOW (ref 36.0–46.0)
Hemoglobin: 8.2 g/dL — ABNORMAL LOW (ref 12.0–15.0)

## 2019-03-07 LAB — HEPARIN LEVEL (UNFRACTIONATED): Heparin Unfractionated: 0.69 IU/mL (ref 0.30–0.70)

## 2019-03-07 LAB — PROCALCITONIN: Procalcitonin: 0.15 ng/mL

## 2019-03-07 LAB — ECHOCARDIOGRAM COMPLETE
Height: 69 in
Weight: 3474.45 oz

## 2019-03-07 LAB — PREPARE RBC (CROSSMATCH)

## 2019-03-07 LAB — PHOSPHORUS: Phosphorus: 4.3 mg/dL (ref 2.5–4.6)

## 2019-03-07 LAB — MAGNESIUM: Magnesium: 1.7 mg/dL (ref 1.7–2.4)

## 2019-03-07 MED ORDER — HEPARIN (PORCINE) 25000 UT/250ML-% IV SOLN
1200.0000 [IU]/h | INTRAVENOUS | Status: DC
  Administered 2019-03-07 – 2019-03-09 (×3): 1200 [IU]/h via INTRAVENOUS
  Filled 2019-03-07 (×3): qty 250

## 2019-03-07 MED ORDER — SODIUM CHLORIDE 0.9 % IV SOLN
2.0000 g | INTRAVENOUS | Status: AC
  Administered 2019-03-08 – 2019-03-12 (×5): 2 g via INTRAVENOUS
  Filled 2019-03-07 (×5): qty 2

## 2019-03-07 MED ORDER — PERFLUTREN LIPID MICROSPHERE
4.0000 mL | Freq: Once | INTRAVENOUS | Status: AC
  Administered 2019-03-07: 4 mL via INTRAVENOUS

## 2019-03-07 MED ORDER — SODIUM CHLORIDE 0.9% IV SOLUTION: Freq: Once | INTRAVENOUS | Status: DC

## 2019-03-07 MED ORDER — TRAMADOL HCL 50 MG PO TABS: 100.0000 mg | ORAL_TABLET | Freq: Three times a day (TID) | ORAL | Status: DC

## 2019-03-07 MED ORDER — INSULIN GLARGINE 100 UNIT/ML ~~LOC~~ SOLN
60.0000 [IU] | Freq: Two times a day (BID) | SUBCUTANEOUS | Status: DC
  Administered 2019-03-07 – 2019-03-13 (×12): 60 [IU] via SUBCUTANEOUS
  Filled 2019-03-07 (×15): qty 0.6

## 2019-03-07 MED ORDER — VANCOMYCIN HCL IN DEXTROSE 1-5 GM/200ML-% IV SOLN
1000.0000 mg | INTRAVENOUS | Status: DC
  Filled 2019-03-07: qty 200

## 2019-03-07 MED ORDER — VANCOMYCIN HCL IN DEXTROSE 750-5 MG/150ML-% IV SOLN
750.0000 mg | INTRAVENOUS | Status: DC
  Administered 2019-03-07 – 2019-03-08 (×2): 750 mg via INTRAVENOUS
  Filled 2019-03-07 (×3): qty 150

## 2019-03-07 MED ORDER — TRAMADOL HCL 50 MG PO TABS
100.0000 mg | ORAL_TABLET | Freq: Three times a day (TID) | ORAL | Status: DC
  Administered 2019-03-07 – 2019-03-13 (×19): 100 mg via ORAL
  Filled 2019-03-07 (×19): qty 2

## 2019-03-07 MED ORDER — MIDODRINE HCL 5 MG PO TABS
5.0000 mg | ORAL_TABLET | Freq: Three times a day (TID) | ORAL | Status: DC
  Administered 2019-03-08 – 2019-03-12 (×11): 5 mg via ORAL
  Filled 2019-03-07 (×16): qty 1

## 2019-03-07 MED ORDER — NOREPINEPHRINE 4 MG/250ML-% IV SOLN
0.0000 ug/min | INTRAVENOUS | Status: DC
  Administered 2019-03-07 – 2019-03-09 (×4): 2 ug/min via INTRAVENOUS
  Administered 2019-03-10: 1 ug/min via INTRAVENOUS
  Filled 2019-03-07 (×4): qty 250

## 2019-03-07 MED ORDER — GABAPENTIN 600 MG PO TABS
600.0000 mg | ORAL_TABLET | Freq: Three times a day (TID) | ORAL | Status: DC
  Administered 2019-03-07 – 2019-03-13 (×19): 600 mg via ORAL
  Filled 2019-03-07 (×21): qty 1

## 2019-03-07 NOTE — Progress Notes (Signed)
Chart reviewed.  Appreciate CCM care.

## 2019-03-07 NOTE — Progress Notes (Addendum)
Patient ID: [name], female   DOB: 03-09-1951, 68 y.o.   MRN: [id number]     Subjective:  Patient reports pain as mild.  Patient is in bed and in no acute distress.  She denies any CP or SOB  Objective:   VITALS:   Vitals:   03/07/19 0615 03/07/19 0630 03/07/19 0645 03/07/19 0700  BP:   (!) 114/49 (!) 113/58  Pulse: 92 92 90 90  Resp: 16 (!) 21 (!) 28 (!) 24  Temp:      TempSrc:      SpO2: 96% 98% 96% 95%  Weight:      Height:        ABD soft Sensation intact distally Dorsiflexion/Plantar flexion intact Incision: dressing C/D/I and no drainage   Lab Results  Component Value Date   WBC 23.5 (H) 03/07/2019   HGB 8.8 (L) 03/07/2019   HCT 29.4 (L) 03/07/2019   MCV 85.7 03/07/2019   PLT 403 (H) 03/07/2019   BMET    Component Value Date/Time   NA 135 03/06/2019 1904   NA 139 01/03/2013 1311   NA 138 03/12/2009 1059   K 4.9 03/06/2019 1904   K 3.7 01/03/2013 1311   K 4.6 03/12/2009 1059   CL 103 03/06/2019 1904   CL 106 01/03/2013 1311   CL 96 (L) 03/12/2009 1059   CO2 22 03/06/2019 1904   CO2 22 01/03/2013 1311   CO2 28 03/12/2009 1059   GLUCOSE 180 (H) 03/06/2019 1904   GLUCOSE 92 01/03/2013 1311   GLUCOSE 280 (H) 03/12/2009 1059   BUN 22 03/06/2019 1904   BUN 13 01/03/2013 1311   BUN 13 03/12/2009 1059   CREATININE 1.29 (H) 03/06/2019 2155   CREATININE 1.17 01/03/2013 1311   CREATININE 1.3 (H) 03/12/2009 1059   CALCIUM 8.5 (L) 03/06/2019 1904   CALCIUM 9.0 01/03/2013 1311   CALCIUM 9.5 03/12/2009 1059   GFRNONAA 42 (L) 03/06/2019 2155   GFRNONAA 50 (L) 01/03/2013 1311   GFRAA 49 (L) 03/06/2019 2155   GFRAA 58 (L) 01/03/2013 1311     Assessment/Plan: 1 Day Post-Op   Principal Problem:   Below-knee amputation of right lower extremity (HCC) Active Problems:   S/P below knee amputation, right (HCC)   Hypoxemia   Shock circulatory (HCC)   Acute pain   Acute encephalopathy   Advance diet Up with therapy Continue plan per  Critical care Continue with medicine consult Splint at all times   Lunette Stands 03/07/2019, 7:28 AM  Discussed and agree with above.   Marchia Bond, MD Cell [phone]

## 2019-03-07 NOTE — Progress Notes (Addendum)
ANTICOAGULATION CONSULT NOTE - Initial Consult  Pharmacy Consult for Heparin Indication: chest pain/ACS  Allergies  Allergen Reactions  . Cymbalta [Duloxetine Hcl] Nausea And Vomiting and Other (See Comments)    Vomiting and stiff muscles in legs  . Toradol [Ketorolac Tromethamine] Hives    Patient Measurements: Height: 5\' 9"  (175.3 cm) Weight: 217 lb 2.5 oz (98.5 kg) IBW/kg (Calculated) : 66.2 Heparin Dosing Weight: 87.5 kg  Vital Signs: Temp: 98.6 F (37 C) (03/10 1150) Temp Source: Oral (03/10 1150) BP: 88/56 (03/10 1200) Pulse Rate: 90 (03/10 1200)  Labs: Recent Labs    03/06/19 1904 03/06/19 2155 03/07/19 0033 03/07/19 0616 03/07/19 1114  HGB 6.4* 8.2*  --  8.8*  --   HCT 21.6* 26.5*  --  29.4*  --   PLT 364 345  --  403*  --   CREATININE 1.27* 1.29*  --  1.45*  --   TROPONINI 0.03*  --  2.08* 23.21* 29.49*    Estimated Creatinine Clearance: 45.7 mL/min (A) (by C-G formula based on SCr of 1.45 mg/dL (H)).   Medical History: Past Medical History:  Diagnosis Date  . Below-knee amputation of right lower extremity ([place]) 03/06/2019  . 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 CHF (congestive heart failure) ([place])   . Complication of anesthesia   . Coronary artery disease   . 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 Fracture    right ankle  . Fracture of humerus, proximal, left, closed 02/23/2014  . Full dentures   . Myocardial infarct ([place])   . Neuropathic pain   . Panic attacks    Hx: of  . PONV (postoperative nausea and vomiting)   . Wears glasses     Medications:  Scheduled:  . atorvastatin  40 mg Oral QHS  . [START ON 03/08/2019] cholecalciferol  2,000 Units Oral Daily  . docusate sodium  100 mg Oral BID  . gabapentin  600 mg Oral TID  . insulin aspart  0-15 Units Subcutaneous TID WC  . insulin glargine  60 Units Subcutaneous BID  . metoCLOPramide  10 mg Oral QHS  . midodrine  5 mg Oral TID WC  . pantoprazole  40 mg Oral Daily  .  traMADol  100 mg Oral TID    Assessment: 18 YOF who presented for R BKA. PMH includes prior MI w/ stent placed in LAD in 2008, on aspirin and Plavix PTA with a patent stent per heart cath in Jan 2019. This admission, troponins were drawn as part of sepsis workup and have trended 0.03>2.08>23.21>29.49. EKG shows RBBB (not new) and nonspecific ST changes that do not appear new per cardiology. She denies any anginal symptoms. Pharmacy has been consulted for heparin dosing x48 hrs for ACS rule out.  Patient received dose of prophylactic Lovenox this morning, so heparin level will likely be elevated. Will not bolus and will check heparin level and aPTT with AM labs tomorrow to see if they are correlated. No bleeding noted.  Goal of Therapy:  Heparin level 0.3-0.7 units/ml aPTT 66-102 seconds Monitor platelets by anticoagulation protocol: Yes   Plan:  Start heparin infusion at 1200 units/hr  Check heparin level, aPTT, and CBC daily Monitor for s/sx of bleeding  Jackson Latino, PharmD PGY1 Pharmacy Resident Phone [phone] 03/07/2019     1:09 PM

## 2019-03-07 NOTE — Progress Notes (Signed)
eLink Physician-Brief Progress Note Patient Name: [name] DOB: [birth year] MRN: [id number]   Date of Service  03/07/2019  HPI/Events of Note  Troponin = 0.03 --> 2.08. Not able to use ASA d/t Toradol allergy (hives). HR = 93. BP = 93/38 with MAP = 55. Therefore, can't B-Block.   eICU Interventions  Will order: 1. 12 Lead EKG now.  2. 2D Cardiac Echo in AM.     Intervention Category Intermediate Interventions: Diagnostic test evaluation  Sommer,Steven Eugene 03/07/2019, 2:27 AM

## 2019-03-07 NOTE — Progress Notes (Signed)
  Echocardiogram 2D Echocardiogram has been performed.  [name] 03/07/2019, 4:30 PM

## 2019-03-07 NOTE — Progress Notes (Signed)
CRITICAL VALUE ALERT  Critical Value:  Troponin 2.08  Date & Time Notied:  03/07/19 1:40 AM  Provider Notified: elink  Orders Received/Actions taken:

## 2019-03-07 NOTE — Progress Notes (Signed)
Would defer to CCM if she needs to still be in the unit, not sure the significance of the troponin elevation.   The discussion with Dr. Evalee Mutton yesterday was that she would be transferred to the medical service when she is no longer needing CCM.    Johnny Bridge, MD

## 2019-03-07 NOTE — Progress Notes (Addendum)
Pharmacy Antibiotic Note  [name] is a 68 y.o. female admitted on 03/06/2019 for R BKA. Patient fell a month ago and has had poor wound healing, fell on it again and was found to have drainage and necrotic tissue. Pharmacy has been consulted for vancomycin and zosyn dosing.  Patient is afebrile, but WBC is up to 23.5 and procalcitonin is 0.15. Scr is up to 1.82 today. Will adjust vancomycin and cefepime dose.  Plan: Vancomycin 750 mg IV Q 24 hrs. Goal AUC 400-550. Expected AUC: 512.7 SCr used: 1.82 Change cefepime to 2 g IV q24h Monitor renal function, C&S, clinical improvement, and vanc levels prn  Height: 5\' 9"  (175.3 cm) Weight: 217 lb 2.5 oz (98.5 kg) IBW/kg (Calculated) : 66.2  Temp (24hrs), Avg:98.2 F (36.8 C), Min:97.3 F (36.3 C), Max:98.7 F (37.1 C)  Recent Labs  Lab 03/06/19 1904 03/06/19 2155 03/07/19 0616  WBC 20.5* 18.1* 23.5*  CREATININE 1.27* 1.29* 1.45*    Estimated Creatinine Clearance: 45.7 mL/min (A) (by C-G formula based on SCr of 1.45 mg/dL (H)).    Allergies  Allergen Reactions  . Cymbalta [Duloxetine Hcl] Nausea And Vomiting and Other (See Comments)    Vomiting and stiff muscles in legs  . Toradol [Ketorolac Tromethamine] Hives    Antimicrobials this admission: Cefepime 3/9 >> Vanc 3/9 >>  Dose adjustments this admission: N/A  Microbiology results: 3/9 Bcx: NGTD 3/9 MRSA PCR: neg  Thank you for allowing pharmacy to be a part of this patient's care.  Jackson Latino, PharmD PGY1 Pharmacy Resident Phone [phone] 03/07/2019     3:19 PM

## 2019-03-07 NOTE — Progress Notes (Signed)
Inpatient Diabetes Program Recommendations  AACE/ADA: New Consensus Statement on Inpatient Glycemic Control (2015)  Target Ranges:  Prepandial:   less than 140 mg/dL      Peak postprandial:   less than 180 mg/dL (1-2 hours)      Critically ill patients:  140 - 180 mg/dL   Results for [name], [name] (MRN [id number]) as of 03/07/2019 07:50  Ref. Range 03/06/2019 11:50 03/06/2019 17:20 03/06/2019 23:14 03/07/2019 03:19 03/07/2019 07:28  Glucose-Capillary Latest Ref Range: 70 - 99 mg/dL 122 (H) 126 (H)    5 mg Decadron given at 3:30pm 339 (H)  7 units NOVOLOG  311 (H)  7 units NOVOLOG  258 (H)    Admit with: R Ankle Fracture/ Originally had planned for open reduction internal fixation, but unfortunately her neuropathy and her injury has progressed to the point where she requires BKA  History: DM, CHF  Home DM Meds: Lantus 60 units BID       Novolog 20-60 units TID with meals  Current Orders: Lantus 60 units BID     Novolog Moderate Correction Scale/ SSI (0-15 units) TID AC   Endocrinologist: Dr. Lucilla Lame with [facility]--Last seen 01/20/2019.  At that visit, patient was instructed to take the following: Lantus 100 units BID Novolog 50 units with Breakfast/ 35 units with Lunch/ 60 units with Dinner     Note patient received 5 mg Decadron X 1 dose in the OR yest around 3pm.  CBGs quite elevated last PM.  Per Home Med Rec, patient took 30 units Lantus yesterday AM around 6am.  That was the last time she received any basal insulin.    MD- Please change start time of Lantus to start this AM (NOT scheduled to start until tonight)     --Will follow patient during hospitalization--  Wyn Quaker RN, MSN, CDE Diabetes Coordinator Inpatient Glycemic Control Team Team Pager: [phone] (8a-5p)

## 2019-03-07 NOTE — Progress Notes (Signed)
[place] for Heparin Indication: chest pain/ACS  Allergies  Allergen Reactions  . Cymbalta [Duloxetine Hcl] Nausea And Vomiting and Other (See Comments)    Vomiting and stiff muscles in legs  . Toradol [Ketorolac Tromethamine] Hives    Patient Measurements: Height: 5\' 9"  (175.3 cm) Weight: 217 lb 2.5 oz (98.5 kg) IBW/kg (Calculated) : 66.2 Heparin Dosing Weight: 87.5 kg  Vital Signs: Temp: 98 F (36.7 C) (03/10 1955) Temp Source: Oral (03/10 1955) BP: 111/44 (03/10 2300) Pulse Rate: 111 (03/10 2300)  Labs: Recent Labs    03/06/19 2155 03/07/19 0033 03/07/19 0616 03/07/19 1114 03/07/19 1541 03/07/19 2146 03/07/19 2303  HGB 8.2*  --  8.8*  --  6.9* 8.2*  --   HCT 26.5*  --  29.4*  --  22.6* 26.6*  --   PLT 345  --  403*  --  251  --   --   APTT  --   --   --   --   --  119*  --   HEPARINUNFRC  --   --   --   --   --   --  0.69  CREATININE 1.29*  --  1.45*  --  1.82*  --   --   TROPONINI  --  2.08* 23.21* 29.49*  --   --   --     Estimated Creatinine Clearance: 36.4 mL/min (A) (by C-G formula based on SCr of 1.82 mg/dL (H)).   Medical History: Past Medical History:  Diagnosis Date  . Below-knee amputation of right lower extremity ([place]) 03/06/2019  . 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 CHF (congestive heart failure) ([place])   . Complication of anesthesia   . Coronary artery disease   . Diabetes mellitus without complication ([place])    type 2   . Fracture    right ankle  . Fracture of humerus, proximal, left, closed 02/23/2014  . Full dentures   . Myocardial infarct ([place])   . Neuropathic pain   . Panic attacks    Hx: of  . PONV (postoperative nausea and vomiting)   . Wears glasses     Medications:  Scheduled:  . sodium chloride   Intravenous Once  . atorvastatin  40 mg Oral QHS  . [START ON 03/08/2019] cholecalciferol  2,000 Units Oral Daily  . docusate sodium  100 mg Oral BID  . gabapentin  600 mg Oral TID  . insulin  aspart  0-15 Units Subcutaneous TID WC  . insulin glargine  60 Units Subcutaneous BID  . metoCLOPramide  10 mg Oral QHS  . midodrine  5 mg Oral TID WC  . pantoprazole  40 mg Oral Daily  . traMADol  100 mg Oral TID    Assessment: 9 YOF who presented for R BKA. PMH includes prior MI w/ stent placed in LAD in 2008, on aspirin and Plavix PTA with a patent stent per heart cath in Jan 2019. This admission, troponins were drawn as part of sepsis workup and have trended 0.03>2.08>23.21>29.49. EKG shows RBBB (not new) and nonspecific ST changes that do not appear new per cardiology. She denies any anginal symptoms. Pharmacy has been consulted for heparin dosing x48 hrs for ACS rule out.  3/10 PM update: heparin level is therapeutic at 0.69 tonight  Goal of Therapy:  Heparin level 0.3-0.7 units/mL Monitor platelets by anticoagulation protocol: Yes   Plan:  Cont heparin at 1200 units/hr Confirmatory heparin level  with AM labs  Narda Bonds, PharmD, [place] Pharmacist Phone: [phone]

## 2019-03-07 NOTE — Progress Notes (Signed)
NAME:  [name], MRN:  [id number], DOB:  11-07-1951, LOS: 1 ADMISSION DATE:  03/06/2019, CONSULTATION DATE:  03/06/2019 REFERRING MD:  Dr. Mardelle Matte, CHIEF COMPLAINT:  Hypotension  Brief History   68 yoF with systolic HF and severe diabetes s/p initial fall with R ankle fracture 2/19.  F/u w/ ortho and scheduled for ORIF but found to have significant damage to splint with bleeding and open tibia fracture with purulent drainage and necrotic tissue.  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  EBL ~700.  Extubated post op but was hypotensive requiring pressors.  Labs pending.   History of present illness   68 year old female with history of systolic heart failure (04/1699 EF 25-30%), CAD, DM, diabetic neuropathy, panic attacks, prior breast surgery s/p R mastectomy, gastroparesis who suffered initial R ankle fracture 2/19 after a fall.  Was seen outpatient by Ortho and splint reapplied.  Apparently had additional fall 3 days ago but did not seek medical care.  Had denied prior chills or fever.  Had originally planned for open reduction with internal fixation but was found with significant damage to her splint although she denies walking on it.  Found to have open wound over distal medial malleolus with exposed tibia with dislocation and drainage with foul odor and purulent material with necrotic subcutaneous tissue.  Limb unable to be salvaged.  Admitted and taken to OR by orthopedics for right BKA.  EBL ~700 ml.  Post operatively, patient extubated but remained hypotensive requiring pressors.  No post op labs available.  Unit of PRBC ordered. Patient to have nerve block in PACU for RLE pain.  Patient with limited poor peripheral access.  PCCM consulted for ICU admit.   Past Medical History  Systolic heart failure (12/7492 EF 25-30%), CAD, DM, diabetic neuropathy, panic attacks, prior breast surgery s/p R mastectomy, gastroparresis  Significant Hospital Events   2/19 initial R ankle fracture  Consults:    Procedures:  3/9 s/p R BKA  Significant Diagnostic Tests:   Micro Data:  3/9 BC x 2 >>  Antimicrobials:  3/9 ancef preop 3/9 vanc >> 3/9 cefepime >>  Interim history/subjective:  No events C/o pain at amputation site Continue Neo for now  Objective   Blood pressure (!) 113/58, pulse 90, temperature 98.7 F (37.1 C), temperature source Axillary, resp. rate (!) 24, height 5\' 9"  (1.753 m), weight 98.5 kg, SpO2 95 %.        Intake/Output Summary (Last 24 hours) at 03/07/2019 1025 Last data filed at 03/07/2019 0900 Gross per 24 hour  Intake 5247.09 ml  Output 1326 ml  Net 3921.09 ml   Filed Weights   03/06/19 1151 03/07/19 0530  Weight: 97.5 kg 98.5 kg   Examination: General:  Chronically ill appearing female, painful distress HEENT: [place]/AT, PERRL, EOM-I and MMM Neuro: Alert and interactive, moving all ext to command CV: RRR, Nl S1/S2 and -M/R/G PULM: CTA bilaterally GI: Soft, NT, ND and +BS Extremities: cool/dry, R BKA with ACE wrap- CDI, LLE without edema Skin: No rashes, R arm restricted, prior R mastectomy   Resolved Hospital Problem list    Assessment & Plan:  Shock - septic vs hemorraghic, r/o cardiac process with CAD/ HFrPF hx P:  Maintain in the ICU while on pressors Continue Neo for MAP > 65 KVO IVF Continue empiric vanc and cefepime Trend fever curve/WBC Hold off central access at this point Will attempt to get off neo today as I believe this may be a measurement  issue CXR now  Monitor UOP  Open right ankle fracture s/p R BKA P:  Per Ortho Pain management  Dilaudid/oxy per ortho, limit as able  DM P:  SSI sensitive for now, NPO CBG q4 Holding home levemir  Hx systolic HF, CAD, HLD P:  Hold home lasix, metolazone, and entresto Defer plavix restarting to ortho  Peripheral neuropathy P:  Hold Neurontin, flexeril, diazepam  GERD, gastroparesis P:  Continue protonix  Hold bentyl  Maintain in the ICU while on pressors, will attempt  to get off pressors today.  Add stress dose steroids and midodrine for BP support.  If continues to read low may need to place an a-line.  Best practice:  Diet: NPO Pain/Anxiety/Delirium protocol (if indicated): n/a VAP protocol (if indicated): n/a DVT prophylaxis: SQ heparin GI prophylaxis: PPI Glucose control: SSI Mobility: BR Code Status: Full Family Communication: no family at bedside Disposition: ICU  Labs   CBC: Recent Labs  Lab 03/06/19 1904 03/06/19 2155 03/07/19 0616  WBC 20.5* 18.1* 23.5*  NEUTROABS 18.2*  --   --   HGB 6.4* 8.2* 8.8*  HCT 21.6* 26.5* 29.4*  MCV 84.0 83.9 85.7  PLT 364 345 403*    Basic Metabolic Panel: Recent Labs  Lab 03/06/19 1904 03/06/19 2155 03/07/19 0616  NA 135  --  133*  K 4.9  --  6.0*  CL 103  --  104  CO2 22  --  17*  GLUCOSE 180*  --  293*  BUN 22  --  26*  CREATININE 1.27* 1.29* 1.45*  CALCIUM 8.5*  --  8.1*  MG  --   --  1.7  PHOS  --   --  4.3   GFR: Estimated Creatinine Clearance: 45.7 mL/min (A) (by C-G formula based on SCr of 1.45 mg/dL (H)). Recent Labs  Lab 03/06/19 1904 03/06/19 2155 03/07/19 0616  PROCALCITON <0.10  --  0.15  WBC 20.5* 18.1* 23.5*    Liver Function Tests: No results for input(s): AST, ALT, ALKPHOS, BILITOT, PROT, ALBUMIN in the last 168 hours. No results for input(s): LIPASE, AMYLASE in the last 168 hours. No results for input(s): AMMONIA in the last 168 hours.  ABG    Component Value Date/Time   TCO2 21 10/14/2013 1513     Coagulation Profile: No results for input(s): INR, PROTIME in the last 168 hours.  Cardiac Enzymes: Recent Labs  Lab 03/06/19 1904 03/07/19 0033 03/07/19 0616  TROPONINI 0.03* 2.08* 23.21*    HbA1C: Hgb A1c MFr Bld  Date/Time Value Ref Range Status  02/22/2019 11:51 AM 7.8 (H) 4.8 - 5.6 % Final    Comment:    (NOTE) Pre diabetes:          5.7%-6.4% Diabetes:              >6.4% Glycemic control for   <7.0% adults with diabetes      CBG: Recent Labs  Lab 03/06/19 1150 03/06/19 1720 03/06/19 2314 03/07/19 0319 03/07/19 0728  GLUCAP 122* 126* 339* 311* 258*    Review of Systems:   Unable  Past Medical History  She,  has a past medical history of Below-knee amputation of right lower extremity ([place]) (03/06/2019), Cancer ([place]) (2012), CHF (congestive heart failure) ([place]), Complication of anesthesia, Coronary artery disease, Diabetes mellitus without complication ([place]), Fracture, Fracture of humerus, proximal, left, closed (02/23/2014), Full dentures, Myocardial infarct ([place]), Neuropathic pain, Panic attacks, PONV (postoperative nausea and vomiting), and Wears glasses.   Surgical History  Past Surgical History:  Procedure Laterality Date  . APPENDECTOMY    . BREAST BIOPSY Left 02/12/15   benign, clip placed  . BREAST SURGERY Right 2012   Right mastectomy with SLN, DCIS, grade 1-2. ER/PR not performed.   Marland Kitchen CARDIAC CATHETERIZATION    . cardiac stents  2007  . CHOLECYSTECTOMY    . COLON SURGERY     Subtotal colectomy for colonic inertia.  . COLONOSCOPY    . DILATION AND CURETTAGE OF UTERUS    . EYE SURGERY    . LEFT HEART CATH AND CORONARY ANGIOGRAPHY Left 01/14/2018   Procedure: LEFT HEART CATH AND CORONARY ANGIOGRAPHY;  Surgeon: Dionisio David, MD;  Location: [place] [facility];  Service: Cardiovascular;  Laterality: Left;  Marland Kitchen MASTECTOMY Right 2012   Hx: of right breast, DCIS   . MULTIPLE TOOTH EXTRACTIONS    . ORIF HUMERUS FRACTURE Left 02/23/2014   Procedure: OPEN REDUCTION INTERNAL FIXATION (ORIF) PROXIMAL HUMERUS FRACTURE;  Surgeon: Johnny Bridge, MD;  Location: [place];  Service: Orthopedics;  Laterality: Left;  . TUBAL LIGATION       Social History   reports that she has never smoked. She has never used smokeless tobacco. She reports that she does not drink alcohol or use drugs.   Family History   Her family history includes Diabetes in her brother and mother; Hypertension in her mother; Stroke  in her mother. There is no history of Breast cancer.   Allergies Allergies  Allergen Reactions  . Cymbalta [Duloxetine Hcl] Nausea And Vomiting and Other (See Comments)    Vomiting and stiff muscles in legs  . Toradol [Ketorolac Tromethamine] Hives     Home Medications  Prior to Admission medications   Medication Sig Start Date End Date Taking? Authorizing Provider  acetaminophen (TYLENOL) 500 MG tablet Take 1,000 mg by mouth every 6 (six) hours as needed for moderate pain or headache.   Yes [provider]  atorvastatin (LIPITOR) 40 MG tablet Take 40 mg by mouth at bedtime.   Yes [provider]  Cholecalciferol (VITAMIN D) 2000 units CAPS Take 2,000 Units by mouth daily.    Yes [provider]  clonazePAM (KLONOPIN) 0.5 MG tablet Take 0.5 mg by mouth daily as needed for anxiety.  08/07/18  Yes [provider]  clopidogrel (PLAVIX) 75 MG tablet Take 75 mg by mouth daily with breakfast.   Yes [provider]  cyclobenzaprine (FLEXERIL) 10 MG tablet Take 1 tablet (10 mg total) by mouth 2 (two) times daily. Patient taking differently: Take 10 mg by mouth at bedtime.  07/17/15  Yes Molli Barrows, MD  diazepam (VALIUM) 5 MG tablet Take 1 tablet (5 mg total) by mouth every 8 (eight) hours as needed for muscle spasms. 02/15/19  Yes Earleen Newport, MD  diphenoxylate-atropine (LOMOTIL) 2.5-0.025 MG tablet Take 1 tablet by mouth 4 (four) times daily as needed for diarrhea or loose stools.  11/24/17  Yes [provider]  furosemide (LASIX) 40 MG tablet Take 40 mg by mouth daily. 01/27/19  Yes [provider]  gabapentin (NEURONTIN) 600 MG tablet Take 1 tablet (600 mg total) by mouth 3 (three) times daily. 07/17/15  Yes Molli Barrows, MD  insulin aspart (NOVOLOG) 100 UNIT/ML injection Inject 20-60 Units into the skin 3 (three) times daily before meals. Per sliding scale   Yes [provider]  insulin glargine (LANTUS) 100  UNIT/ML injection Inject 60 Units into the skin 2 (two)  times daily.   Yes [provider]  metoCLOPramide (REGLAN) 10 MG tablet Take 10 mg by mouth at bedtime.    Yes [provider]  metolazone (ZAROXOLYN) 2.5 MG tablet Take 2.5 mg by mouth daily. 02/13/19  Yes [provider]  oxyCODONE-acetaminophen (PERCOCET) 7.5-325 MG tablet Take 1 tablet by mouth every 4 (four) hours as needed for severe pain. 02/15/19 02/15/20 Yes Earleen Newport, MD  pantoprazole (PROTONIX) 40 MG tablet Take 40 mg by mouth daily.  10/01/18  Yes [provider]  sacubitril-valsartan (ENTRESTO) 24-26 MG Take 1 tablet by mouth 2 (two) times daily.    Yes [provider]  traMADol (ULTRAM) 50 MG tablet Take 2 tablets (100 mg total) by mouth every 8 (eight) hours. 11/28/18  Yes Molli Barrows, MD  dicyclomine (BENTYL) 10 MG capsule Take 1 capsule (10 mg total) by mouth 3 (three) times daily with meals as needed for spasms. Patient not taking: Reported on 02/21/2019 04/25/18   Arta Silence, MD  ondansetron [facility]) 4 MG tablet Take 4 mg by mouth every 8 (eight) hours as needed for nausea or vomiting.  01/07/18   [provider]    The patient is critically ill with multiple organ systems failure and requires high complexity decision making for assessment and support, frequent evaluation and titration of therapies, application of advanced monitoring technologies and extensive interpretation of multiple databases.   Critical Care Time devoted to patient care services described in this note is  32  Minutes. This time reflects time of care of this signee Dr Jennet Maduro. This critical care time does not reflect procedure time, or teaching time or supervisory time of PA/NP/Med student/Med Resident etc but could involve care discussion time.  Rush Farmer, M.D. [facility] Pulmonary/Critical Care Medicine. Pager: [phone]. After hours pager: [phone].

## 2019-03-07 NOTE — Consult Note (Addendum)
Cardiology Consultation:   Patient ID: [name] MRN: [id number]; DOB: 03-19-1951  Admit date: 03/06/2019 Date of Consult: 03/07/2019  Primary Care Provider: Perrin Maltese, MD Primary Cardiologist: Charolotte Capuchin Primary Electrophysiologist:  None    Patient Profile:   [name] is a 68 y.o. female with a hx of CAD s/p LAD stent, chronic systolic heart failure, DM, neuropathy, gastroparesis, panic attacks, and breast cancer s/p right mastectomy who is being seen today for the evaluation of elevated troponin at the request of Dr. Mardelle Matte.  History of Present Illness:   [name] has previously followed with Dr. Humphrey Rolls in [place]. Dr. Humphrey Rolls noted an abnormal stress test in 2018 with recommendations for CT angiography, but patient refused at that time because she as asymptomatic. Following a syncopal event, she had a heart cath 01/14/18 that showed moderate disease in the LAD and OM1 and patent stent in the mid LAD. There is mention of systolic heart failure with EF of 25-30% on cath report, echo here pending. She presented to [facility] for repair of a right ankle fracture that occurred after a fall 02/15/19. She saw ortho OP and splint was placed. She had another fall three days prior to admission but did not seek medical help. She presented for ORIF but found to have an open wound over distal medial malleolus with exposed tibia with dislocation and purulent drainage. 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 After surgery, she was extubated but hypotensive requiring neo. She was started on empiric ABX. Critical Care consulted for management of shock. Troponin was trended and found to be significantly elevated at 23 today.  Repeat troponin pending.  Cardiology was consulted.   On my interview, the patient had an MI and stent placed in her mid LAD in 2008. Per the patient, she had a bout of dizziness with suspected syncope prompting a repeat heart  catheterization. Heart cath with moderate disease in the LAD and OM1, no intervention, patent midLAD stent. Following this heart cath, she was told she had systolic heart failure and was placed on entresto, lasix, and zaroxolyn. She has done very well on her heart failure medications. Unclear why she wasn't on a beta blocker. She has been maintained on ASA and plavix. Of note, she was seen in the ER 02/09/19 for CHF exacerbation. In consultation with Dr. Humphrey Rolls, she was given extra lasix and discharged with close follow up.   As part of the sepsis workup, troponin was drawn and found to be elevated.  Troponin: 0.03 --> 2.08 --> 23 EKG with RBBB (not new) and nonspecific ST changes that do not appear new when compared to previous tracings  The patient denies anginal symptoms. She states that she has been very stable with no new symptoms over the past year. She is compliant with medications. She never had chest pain when she had her heart attack in 2008.     Past Medical History:  Diagnosis Date  . Below-knee amputation of right lower extremity ([place]) 03/06/2019  . 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 CHF (congestive heart failure) ([place])   . Complication of anesthesia   . Coronary artery disease   . Diabetes mellitus without complication ([place])    type 2   . Fracture    right ankle  . Fracture of humerus, proximal, left, closed 02/23/2014  . Full dentures   . Myocardial infarct ([place])   . Neuropathic pain   . Panic attacks  Hx: of  . PONV (postoperative nausea and vomiting)   . Wears glasses     Past Surgical History:  Procedure Laterality Date  . APPENDECTOMY    . BREAST BIOPSY Left 02/12/15   benign, clip placed  . BREAST SURGERY Right 2012   Right mastectomy with SLN, DCIS, grade 1-2. ER/PR not performed.   Marland Kitchen CARDIAC CATHETERIZATION    . cardiac stents  2007  . CHOLECYSTECTOMY    . COLON SURGERY     Subtotal colectomy for colonic inertia.  . COLONOSCOPY    . DILATION AND CURETTAGE OF  UTERUS    . EYE SURGERY    . LEFT HEART CATH AND CORONARY ANGIOGRAPHY Left 01/14/2018   Procedure: LEFT HEART CATH AND CORONARY ANGIOGRAPHY;  Surgeon: Dionisio David, MD;  Location: [facility];  Service: Cardiovascular;  Laterality: Left;  Marland Kitchen MASTECTOMY Right 2012   Hx: of right breast, DCIS   . MULTIPLE TOOTH EXTRACTIONS    . ORIF HUMERUS FRACTURE Left 02/23/2014   Procedure: OPEN REDUCTION INTERNAL FIXATION (ORIF) PROXIMAL HUMERUS FRACTURE;  Surgeon: Johnny Bridge, MD;  Location: [facility];  Service: Orthopedics;  Laterality: Left;  . TUBAL LIGATION       Home Medications:  Prior to Admission medications   Medication Sig Start Date End Date Taking? Authorizing Provider  acetaminophen (TYLENOL) 500 MG tablet Take 1,000 mg by mouth every 6 (six) hours as needed for moderate pain or headache.   Yes [provider]  atorvastatin (LIPITOR) 40 MG tablet Take 40 mg by mouth at bedtime.   Yes [provider]  Cholecalciferol (VITAMIN D) 2000 units CAPS Take 2,000 Units by mouth daily.    Yes [provider]  clonazePAM (KLONOPIN) 0.5 MG tablet Take 0.5 mg by mouth daily as needed for anxiety.  08/07/18  Yes [provider]  clopidogrel (PLAVIX) 75 MG tablet Take 75 mg by mouth daily with breakfast.   Yes [provider]  cyclobenzaprine (FLEXERIL) 10 MG tablet Take 1 tablet (10 mg total) by mouth 2 (two) times daily. Patient taking differently: Take 10 mg by mouth at bedtime.  07/17/15  Yes Molli Barrows, MD  diazepam (VALIUM) 5 MG tablet Take 1 tablet (5 mg total) by mouth every 8 (eight) hours as needed for muscle spasms. 02/15/19  Yes Earleen Newport, MD  diphenoxylate-atropine (LOMOTIL) 2.5-0.025 MG tablet Take 1 tablet by mouth 4 (four) times daily as needed for diarrhea or loose stools.  11/24/17  Yes [provider]  furosemide (LASIX) 40 MG tablet Take 40 mg by mouth daily. 01/27/19  Yes [provider]  gabapentin  (NEURONTIN) 600 MG tablet Take 1 tablet (600 mg total) by mouth 3 (three) times daily. 07/17/15  Yes Molli Barrows, MD  insulin aspart (NOVOLOG) 100 UNIT/ML injection Inject 20-60 Units into the skin 3 (three) times daily before meals. Per sliding scale   Yes [provider]  insulin glargine (LANTUS) 100 UNIT/ML injection Inject 60 Units into the skin 2 (two) times daily.   Yes [provider]  metoCLOPramide (REGLAN) 10 MG tablet Take 10 mg by mouth at bedtime.    Yes [provider]  metolazone (ZAROXOLYN) 2.5 MG tablet Take 2.5 mg by mouth daily. 02/13/19  Yes [provider]  oxyCODONE-acetaminophen (PERCOCET) 7.5-325 MG tablet Take 1 tablet by mouth every 4 (four) hours as needed for severe pain. 02/15/19 02/15/20 Yes Earleen Newport, MD  pantoprazole (PROTONIX) 40 MG  tablet Take 40 mg by mouth daily.  10/01/18  Yes [provider]  sacubitril-valsartan (ENTRESTO) 24-26 MG Take 1 tablet by mouth 2 (two) times daily.    Yes [provider]  traMADol (ULTRAM) 50 MG tablet Take 2 tablets (100 mg total) by mouth every 8 (eight) hours. 11/28/18  Yes Molli Barrows, MD  dicyclomine (BENTYL) 10 MG capsule Take 1 capsule (10 mg total) by mouth 3 (three) times daily with meals as needed for spasms. Patient not taking: Reported on 02/21/2019 04/25/18   Arta Silence, MD  ondansetron [facility]) 4 MG tablet Take 4 mg by mouth every 8 (eight) hours as needed for nausea or vomiting.  01/07/18   [provider]    Inpatient Medications: Scheduled Meds: . atorvastatin  40 mg Oral QHS  . [START ON 03/08/2019] cholecalciferol  2,000 Units Oral Daily  . docusate sodium  100 mg Oral BID  . enoxaparin (LOVENOX) injection  40 mg Subcutaneous Q24H  . gabapentin  600 mg Oral TID  . insulin aspart  0-15 Units Subcutaneous TID WC  . insulin glargine  60 Units Subcutaneous BID  . metoCLOPramide  10 mg Oral QHS  . pantoprazole  40 mg Oral Daily  .  traMADol  100 mg Oral TID   Continuous Infusions: . sodium chloride 75 mL/hr at 03/07/19 0600  . sodium chloride 10 mL/hr at 03/07/19 0600  . ceFEPime (MAXIPIME) IV    . phenylephrine (NEO-SYNEPHRINE) Adult infusion 110 mcg/min (03/07/19 0837)  . vancomycin     PRN Meds: acetaminophen, bisacodyl, clonazepam, clonazePAM, diazepam, diphenhydrAMINE, diphenoxylate-atropine, HYDROmorphone (DILAUDID) injection, magnesium citrate, metoCLOPramide **OR** metoCLOPramide (REGLAN) injection, ondansetron **OR** ondansetron (ZOFRAN) IV, oxyCODONE, oxyCODONE, polyethylene glycol, zolpidem  Allergies:    Allergies  Allergen Reactions  . Cymbalta [Duloxetine Hcl] Nausea And Vomiting and Other (See Comments)    Vomiting and stiff muscles in legs  . Toradol [Ketorolac Tromethamine] Hives    Social History:   Social History   Socioeconomic History  . Marital status: Widowed    Spouse name: Not on file  . Number of children: Not on file  . Years of education: Not on file  . Highest education level: Not on file  Occupational History  . Not on file  Social Needs  . Financial resource strain: Not on file  . Food insecurity:    Worry: Not on file    Inability: Not on file  . Transportation needs:    Medical: Not on file    Non-medical: Not on file  Tobacco Use  . Smoking status: Never Smoker  . Smokeless tobacco: Never Used  Substance and Sexual Activity  . Alcohol use: No    Alcohol/week: 0.0 standard drinks  . Drug use: No  . Sexual activity: Not Currently    Birth control/protection: Post-menopausal  Lifestyle  . Physical activity:    Days per week: Not on file    Minutes per session: Not on file  . Stress: Not on file  Relationships  . Social connections:    Talks on phone: Not on file    Gets together: Not on file    Attends religious service: Not on file    Active member of club or organization: Not on file    Attends meetings of clubs or organizations: Not on file     Relationship status: Not on file  . Intimate partner violence:    Fear of current or ex partner: Not on file    Emotionally  abused: Not on file    Physically abused: Not on file    Forced sexual activity: Not on file  Other Topics Concern  . Not on file  Social History Narrative  . Not on file    Family History:    Family History  Problem Relation Age of Onset  . Diabetes Mother   . Hypertension Mother   . Stroke Mother   . Diabetes Brother   . Breast cancer Neg Hx      ROS:  Please see the history of present illness.   All other ROS reviewed and negative.     Physical Exam/Data:   Vitals:   03/07/19 0630 03/07/19 0645 03/07/19 0700 03/07/19 0730  BP:  (!) 114/49 (!) 113/58   Pulse: 92 90 90   Resp: (!) 21 (!) 28 (!) 24   Temp:    98.7 F (37.1 C)  TempSrc:    Axillary  SpO2: 98% 96% 95%   Weight:      Height:        Intake/Output Summary (Last 24 hours) at 03/07/2019 1040 Last data filed at 03/07/2019 0900 Gross per 24 hour  Intake 5247.09 ml  Output 1326 ml  Net 3921.09 ml   Last 3 Weights 03/07/2019 03/06/2019 02/09/2019  Weight (lbs) 217 lb 2.5 oz 215 lb 220 lb  Weight (kg) 98.5 kg 97.523 kg 99.791 kg  Some encounter information is confidential and restricted. Go to Review Flowsheets activity to see all data.     Body mass index is 32.07 kg/m.  General:  Well nourished, well developed, in no acute distress HEENT: normal Neck: no JVD Vascular: No carotid bruits  Cardiac:  normal S1, S2; RRR; no murmur  Lungs:  Respirations unlabored, nonproductive cough Abd: soft, nontender, no hepatomegaly  Ext: no edema, right BKA Musculoskeletal:  No deformities, BUE and BLE strength normal and equal Skin: warm and dry  Neuro:  CNs 2-12 intact, no focal abnormalities noted Psych:  Normal affect   EKG:  The EKG was personally reviewed and demonstrates:  RBBB and nonspecific ST changes - both old Telemetry:  Telemetry was personally reviewed and demonstrates:   sinus  Relevant CV Studies:  Left heart cath 01/14/18:  Prox LAD to Mid LAD lesion is 35% stenosed.  Mid LAD to Dist LAD lesion is 60% stenosed.  Ost 1st Mrg to 1st Mrg lesion is 50% stenosed.  Mid Cx to Dist Cx lesion is 50% stenosed.   There is moderate disease in distal LAD and OM1 and stent in the mid LAD has no significant restenosis. LVEF is 25-30% with anteroapical dyskinesis   Abnormal stress test: 04/2017   Coronary CTA with calcium score 2300 - patient deferred cath   Laboratory Data:  Chemistry Recent Labs  Lab 03/06/19 1904 03/06/19 2155 03/07/19 0616  NA 135  --  133*  K 4.9  --  6.0*  CL 103  --  104  CO2 22  --  17*  GLUCOSE 180*  --  293*  BUN 22  --  26*  CREATININE 1.27* 1.29* 1.45*  CALCIUM 8.5*  --  8.1*  GFRNONAA 43* 42* 37*  GFRAA 50* 49* 42*  ANIONGAP 10  --  12    No results for input(s): PROT, ALBUMIN, AST, ALT, ALKPHOS, BILITOT in the last 168 hours. Hematology Recent Labs  Lab 03/06/19 1904 03/06/19 2155 03/07/19 0616  WBC 20.5* 18.1* 23.5*  RBC 2.57* 3.16* 3.43*  HGB 6.4* 8.2* 8.8*  HCT 21.6* 26.5* 29.4*  MCV 84.0 83.9 85.7  MCH 24.9* 25.9* 25.7*  MCHC 29.6* 30.9 29.9*  RDW 16.5* 16.0* 16.3*  PLT 364 345 403*   Cardiac Enzymes Recent Labs  Lab 03/06/19 1904 03/07/19 0033 03/07/19 0616  TROPONINI 0.03* 2.08* 23.21*   No results for input(s): TROPIPOC in the last 168 hours.  BNPNo results for input(s): BNP, PROBNP in the last 168 hours.  DDimer No results for input(s): DDIMER in the last 168 hours.  Radiology/Studies:  Dg Chest Portable 2 Views (neonate)  Result Date: 03/06/2019 CLINICAL DATA:  Hypoxemia EXAM: CHEST  2 VIEW PORTABLE COMPARISON:  02/09/2019 FINDINGS: Mild cardiomegaly. No focal airspace disease or effusion. Aortic atherosclerosis. No pneumothorax. Surgical plate and hardware in the left humerus. IMPRESSION: No active cardiopulmonary disease.  Stable cardiomegaly. Electronically Signed   By: Donavan Foil  M.D.   On: 03/06/2019 19:34    Assessment and Plan:   1. Elevated troponin, known CAD, recent heart cath 12/2017 - 0.03 --> 2.08 --> 23.21 - EKG with RBBB (not new) and nonspecific ST changes that do not appear new when compared to previous tracings - heart cath 01/14/18 with moderate stenosis in the distal LAD (60%) and OM1 (50%) and patent stent in the mid LAD - maintained on ASA and plavix - ASA not listed on home meds, she takes 81 mg, per the patient - pt has been completely asymptomatic - repeat troponin has been drawn, pending results - she does report a cough for the past 2 weeks - nonproductive - in the absence of symptoms and EKG changes, will defer repeat heart catheterization at this time.  - consulted Dr. Mardelle Matte - she has tolerated lovenox since her transfusion,  will consult pharmacy to D/C lovenox and start heparin for 48 hr  - daily CBC - continue to cycle enzymes - restart plavix when OK with ortho   2. Chronic systolic heart failure - home medications include lasix 40 mg daily, zaroxolyn, entresto - unclear why she wasn't on a beta blocker - echo pending - restart heart failure medications as BP improves   3. Hypertension - hypotensive, home meds held - still on phenylephrine - this has just been stopped - she remains hypotensive but mentating well - started midodrine   4. DM - uncontrolled - A1c 7.8%   5. AKI - sCr 1.45 (1.29) - will defer to primary   6. Right BKA - per ortho - patient reporting phantom pain      For questions or updates, please contact [facility] Please consult [email or web address] for contact info under     Signed, Ledora Bottcher, PA  03/07/2019 10:40 AM  Agree with note by Fabian Sharp PA-C  [name] was admitted with an infected broken right ankle and underwent BKA by Dr. Mardelle Matte.  She has a history of stent in her LAD in the past with LV dysfunction probably followed by Dr. Chancy Milroy in [place].  She was initially  septic and in shock on pressors.  She denies chest pain.  She was on typical heart failure meds at home including Coreg and Entresto and was relatively asymptomatic.  Troponins went up to 23.  EKG shows no acute changes with right bundle branch block.  She completely asymptomatic.  I do not think she needs intervention.  2D echo is pending.  She was on aspirin Plavix preadmission.  I recommend 4048 hours of IV heparin and continued medical therapy.  She can follow-up with Dr.  Chancy Milroy as an outpatient.   Lorretta Harp, M.D., [place], [facility], [place] [address] [place], [place]  [address]  [phone] 03/07/2019 12:55 PM

## 2019-03-07 NOTE — Progress Notes (Signed)
PT Cancellation Note  Patient Details Name: [name] MRN: [id number] DOB: April 15, 1951   Cancelled Treatment:      Reason Eval/Treat Not Completed: Medical issues which prohibited therapy; noted pt with elevated troponin and potassium levels. Will follow up for PT eval as schedule permits and as pt is medically ready.  Reinaldo Berber, PT, DPT Acute Rehabilitation Services Pager: [phone] Office: [phone]   Reinaldo Berber 03/07/2019, 3:47 PM

## 2019-03-07 NOTE — Progress Notes (Signed)
eLink Physician-Brief Progress Note Patient Name: BENTLEIGH WAREN DOB: 23-Jan-1951 MRN: [id number]   Date of Service  03/07/2019  HPI/Events of Note  12 Lead EKG - Normal sinus rhythm. Right bundle branch block. Anterolateral infarct , age undetermined. No acute changes.   eICU Interventions  Continue to trend Troponin.     Intervention Category Intermediate Interventions: Diagnostic test evaluation  Kailea Dannemiller [place] 03/07/2019, 3:18 AM

## 2019-03-07 NOTE — Progress Notes (Signed)
OT Cancellation Note  Patient Details Name: [name] MRN: [id number] DOB: 07-31-1951   Cancelled Treatment:    Reason Eval/Treat Not Completed: Medical issues which prohibited therapy; noted pt with elevated troponin and potassium levels. Will follow up for OT eval as schedule permits and as pt is medically ready.  Lou Cal, OT Supplemental Rehabilitation Services Pager [phone] Office [phone]   Raymondo Band 03/07/2019, 1:37 PM

## 2019-03-07 NOTE — Progress Notes (Signed)
Pt does not want echo until echo from two weeks ago in [place] is found in chart.  Contacted dr. Mardelle Matte

## 2019-03-08 ENCOUNTER — Other Ambulatory Visit: Payer: Self-pay

## 2019-03-08 ENCOUNTER — Inpatient Hospital Stay (HOSPITAL_COMMUNITY): Payer: Medicare Other

## 2019-03-08 ENCOUNTER — Inpatient Hospital Stay: Payer: Self-pay

## 2019-03-08 DIAGNOSIS — I214 Non-ST elevation (NSTEMI) myocardial infarction: Secondary | ICD-10-CM

## 2019-03-08 DIAGNOSIS — I5043 Acute on chronic combined systolic (congestive) and diastolic (congestive) heart failure: Secondary | ICD-10-CM

## 2019-03-08 DIAGNOSIS — Z452 Encounter for adjustment and management of vascular access device: Secondary | ICD-10-CM

## 2019-03-08 DIAGNOSIS — J810 Acute pulmonary edema: Secondary | ICD-10-CM

## 2019-03-08 LAB — BPAM RBC
Blood Product Expiration Date: 202004022359
Blood Product Expiration Date: 202004082359
ISSUE DATE / TIME: 202003101710
ISSUE DATE / TIME: 202003091833
Unit Type and Rh: 5100
Unit Type and Rh: 5100

## 2019-03-08 LAB — GLUCOSE, CAPILLARY
Glucose-Capillary: 192 mg/dL — ABNORMAL HIGH (ref 70–99)
Glucose-Capillary: 267 mg/dL — ABNORMAL HIGH (ref 70–99)
Glucose-Capillary: 274 mg/dL — ABNORMAL HIGH (ref 70–99)
Glucose-Capillary: 295 mg/dL — ABNORMAL HIGH (ref 70–99)
Glucose-Capillary: 304 mg/dL — ABNORMAL HIGH (ref 70–99)
Glucose-Capillary: 314 mg/dL — ABNORMAL HIGH (ref 70–99)

## 2019-03-08 LAB — TYPE AND SCREEN
ABO/RH(D): O POS
Antibody Screen: NEGATIVE
UNIT DIVISION: 0
Unit division: 0

## 2019-03-08 LAB — CBC
HCT: 26.9 % — ABNORMAL LOW (ref 36.0–46.0)
Hemoglobin: 8.4 g/dL — ABNORMAL LOW (ref 12.0–15.0)
MCH: 25.8 pg — ABNORMAL LOW (ref 26.0–34.0)
MCHC: 31.2 g/dL (ref 30.0–36.0)
MCV: 82.8 fL (ref 80.0–100.0)
NRBC: 0 % (ref 0.0–0.2)
Platelets: 307 10*3/uL (ref 150–400)
RBC: 3.25 MIL/uL — ABNORMAL LOW (ref 3.87–5.11)
RDW: 16.5 % — ABNORMAL HIGH (ref 11.5–15.5)
WBC: 19.5 10*3/uL — ABNORMAL HIGH (ref 4.0–10.5)

## 2019-03-08 LAB — BASIC METABOLIC PANEL
Anion gap: 7 (ref 5–15)
BUN: 34 mg/dL — ABNORMAL HIGH (ref 8–23)
CO2: 21 mmol/L — AB (ref 22–32)
Calcium: 8.3 mg/dL — ABNORMAL LOW (ref 8.9–10.3)
Chloride: 104 mmol/L (ref 98–111)
Creatinine, Ser: 1.73 mg/dL — ABNORMAL HIGH (ref 0.44–1.00)
GFR calc Af Amer: 34 mL/min — ABNORMAL LOW (ref >60)
GFR calc non Af Amer: 30 mL/min — ABNORMAL LOW (ref >60)
Glucose, Bld: 218 mg/dL — ABNORMAL HIGH (ref 70–99)
Potassium: 5 mmol/L (ref 3.5–5.1)
Sodium: 132 mmol/L — ABNORMAL LOW (ref 135–145)

## 2019-03-08 LAB — HEPARIN LEVEL (UNFRACTIONATED): Heparin Unfractionated: 0.46 IU/mL (ref 0.30–0.70)

## 2019-03-08 LAB — TROPONIN I: Troponin I: 20.09 ng/mL (ref <0.03)

## 2019-03-08 LAB — MAGNESIUM: Magnesium: 1.8 mg/dL (ref 1.7–2.4)

## 2019-03-08 LAB — COOXEMETRY PANEL
Carboxyhemoglobin: 1.4 % (ref 0.5–1.5)
Methemoglobin: 1.5 % (ref 0.0–1.5)
O2 Saturation: 54.3 %
Total hemoglobin: 7.6 g/dL — ABNORMAL LOW (ref 12.0–16.0)

## 2019-03-08 LAB — PHOSPHORUS: Phosphorus: 2.8 mg/dL (ref 2.5–4.6)

## 2019-03-08 MED ORDER — INSULIN ASPART 100 UNIT/ML ~~LOC~~ SOLN
0.0000 [IU] | Freq: Every day | SUBCUTANEOUS | Status: DC
  Administered 2019-03-08 (×2): 3 [IU] via SUBCUTANEOUS
  Administered 2019-03-10: 2 [IU] via SUBCUTANEOUS

## 2019-03-08 MED ORDER — INSULIN ASPART 100 UNIT/ML ~~LOC~~ SOLN
7.0000 [IU] | Freq: Three times a day (TID) | SUBCUTANEOUS | Status: DC
  Administered 2019-03-08 – 2019-03-13 (×13): 7 [IU] via SUBCUTANEOUS

## 2019-03-08 MED ORDER — FUROSEMIDE 10 MG/ML IJ SOLN
80.0000 mg | Freq: Once | INTRAMUSCULAR | Status: AC
  Administered 2019-03-08: 80 mg via INTRAVENOUS
  Filled 2019-03-08: qty 8

## 2019-03-08 MED ORDER — INSULIN ASPART 100 UNIT/ML ~~LOC~~ SOLN
0.0000 [IU] | Freq: Three times a day (TID) | SUBCUTANEOUS | Status: DC
  Administered 2019-03-08: 8 [IU] via SUBCUTANEOUS
  Administered 2019-03-08: 3 [IU] via SUBCUTANEOUS
  Administered 2019-03-08: 11 [IU] via SUBCUTANEOUS
  Administered 2019-03-09: 5 [IU] via SUBCUTANEOUS
  Administered 2019-03-09: 3 [IU] via SUBCUTANEOUS
  Administered 2019-03-09: 5 [IU] via SUBCUTANEOUS
  Administered 2019-03-10 – 2019-03-11 (×4): 2 [IU] via SUBCUTANEOUS
  Administered 2019-03-12 (×3): 3 [IU] via SUBCUTANEOUS

## 2019-03-08 MED ORDER — ASPIRIN EC 81 MG PO TBEC
81.0000 mg | DELAYED_RELEASE_TABLET | Freq: Every day | ORAL | Status: DC
  Administered 2019-03-08 – 2019-03-13 (×6): 81 mg via ORAL
  Filled 2019-03-08 (×7): qty 1

## 2019-03-08 NOTE — Progress Notes (Signed)
Inpatient Diabetes Program Recommendations  AACE/ADA: New Consensus Statement on Inpatient Glycemic Control (2015)  Target Ranges:  Prepandial:   less than 140 mg/dL      Peak postprandial:   less than 180 mg/dL (1-2 hours)      Critically ill patients:  140 - 180 mg/dL   Lab Results  Component Value Date   GLUCAP 295 (H) 03/08/2019   HGBA1C 7.8 (H) 02/22/2019     Results for FRANCENE, MCERLEAN (MRN [id number]) as of 03/08/2019 11:36  Ref. Range 03/07/2019 11:35 03/07/2019 15:26 03/07/2019 18:41 03/07/2019 19:46 03/08/2019 00:09 03/08/2019 07:51 03/08/2019 11:36  Glucose-Capillary Latest Ref Range: 70 - 99 mg/dL 319 (H)  Novolog 11 units  247 (H) 254 (H)  Novolog 8 units 240 (H) 267 (H)  Novolog 3 units  192 (H)  Novolog 3 units  295 (H)  Novolog 8 units       Review of Glycemic Control  Diabetes history: DM2  Home DM Meds: Lantus between 60 - 80 units BID (depending on CBG)                             Novolog 20-60 units TID with meals   Current Orders: Lantus 60 units BID                            Novolog Moderate Correction Scale/ SSI (0-15 units) TID AC    Spoke to patient and informed her of her Hgb A1c of 7.8% (average of 177mg /dl).  Explained what an A1c is and what it measures. Reminded patient that goal A1c is 7% or less per ADA standards to prevent both acute and long-term complications. Explained to patient the extreme importance of good glucose control at home. Patient states she checks her CBG 4-5 x / day and follows up with Dr. Gabriel Carina (endo).  She was pleased that her Hgb A1c is trending closer to goal as it had been as high as 11% in the past (per patient).   Patient has required 33 units of Novolog correction in the past 24 hours for elevated blood glucose. Spoke with RN and patient is eating well. Recommend adding Novolog meal coverage tid wc.      MD please consider following inpatient insulin recommendations:   Add Novolog 7 units meal coverage  TID wc (Please add the following hold parameters:  Hold if NPO or if patient eats less than 50% of meal.)    Thank you.  -- Will follow during hospitalization.--  Jonna Clark RN, MSN Diabetes Coordinator Inpatient Glycemic Control Team Team Pager: [phone] (8am-5pm)

## 2019-03-08 NOTE — Progress Notes (Signed)
Dr. Nelda Marseille gave verbal OK for pt to work with PT/OT. Notified PT.

## 2019-03-08 NOTE — Progress Notes (Signed)
Notified Heart Failure team about pt's resulted co-ox.

## 2019-03-08 NOTE — Progress Notes (Signed)
At bedsidefor PICC insertion;pt had mastectomy 8 years ago and left arm is so swollen and pt stated is very painful to move.Spoke to Salvadore Dom  NP while he is in the room and explain the current status of patient.He said patient might not need PICC and CVC can be done.Primary RN aware.

## 2019-03-08 NOTE — Progress Notes (Signed)
[place] for Heparin Indication: chest pain/ACS  Allergies  Allergen Reactions  . Cymbalta [Duloxetine Hcl] Nausea And Vomiting and Other (See Comments)    Vomiting and stiff muscles in legs  . Toradol [Ketorolac Tromethamine] Hives    Patient Measurements: Height: 5\' 9"  (175.3 cm) Weight: 217 lb 2.5 oz (98.5 kg) IBW/kg (Calculated) : 66.2 Heparin Dosing Weight: 87.5 kg  Vital Signs: Temp: 98.5 F (36.9 C) (03/11 0700) Temp Source: Oral (03/11 0700) BP: 106/48 (03/11 0645) Pulse Rate: 112 (03/11 0645)  Labs: Recent Labs    03/07/19 0616 03/07/19 1114 03/07/19 1541 03/07/19 2146 03/07/19 2303 03/08/19 0310 03/08/19 0946  HGB 8.8*  --  6.9* 8.2*  --  8.4*  --   HCT 29.4*  --  22.6* 26.6*  --  26.9*  --   PLT 403*  --  251  --   --  307  --   APTT  --   --   --  119*  --   --   --   HEPARINUNFRC  --   --   --   --  0.69 0.46  --   CREATININE 1.45*  --  1.82*  --   --  1.73*  --   TROPONINI 23.21* 29.49*  --   --   --   --  20.09*    Estimated Creatinine Clearance: 38.3 mL/min (A) (by C-G formula based on SCr of 1.73 mg/dL (H)).   Medical History: Past Medical History:  Diagnosis Date  . Below-knee amputation of right lower extremity ([place]) 03/06/2019  . 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 CHF (congestive heart failure) ([place])   . Complication of anesthesia   . Coronary artery disease   . Diabetes mellitus without complication ([place])    type 2   . Fracture    right ankle  . Fracture of humerus, proximal, left, closed 02/23/2014  . Full dentures   . Myocardial infarct ([place])   . Neuropathic pain   . Panic attacks    Hx: of  . PONV (postoperative nausea and vomiting)   . Wears glasses     Medications:  Scheduled:  . atorvastatin  40 mg Oral QHS  . cholecalciferol  2,000 Units Oral Daily  . docusate sodium  100 mg Oral BID  . gabapentin  600 mg Oral TID  . insulin aspart  0-15 Units Subcutaneous TID WC  . insulin aspart   0-5 Units Subcutaneous QHS  . insulin glargine  60 Units Subcutaneous BID  . metoCLOPramide  10 mg Oral QHS  . midodrine  5 mg Oral TID WC  . pantoprazole  40 mg Oral Daily  . traMADol  100 mg Oral TID    Assessment: 57 YOF who presented for R BKA. PMH includes prior MI w/ stent placed in LAD in 2008, on aspirin and Plavix PTA with a patent stent per heart cath in Jan 2019. This admission, troponins were drawn as part of sepsis workup and have trended 0.03>2.08>23.21>29.49. EKG shows RBBB (not new) and nonspecific ST changes that do not appear new per cardiology. She denies any anginal symptoms. Pharmacy has been consulted for heparin dosing x48 hrs for ACS rule out.  Heparin level this morning remains therapeutic at 0.46. Hgb remains low at 8.4 (s/p 1 unit pRBC last night), plts wnl. No bleeding or infusion issues noted per RN.    Goal of Therapy:  Heparin level 0.3-0.7 units/mL Monitor  platelets by anticoagulation protocol: Yes   Plan:  Cont heparin at 1200 units/hr Monitor daily heparin level, CBC, s/sx of bleeding   Jackson Latino, PharmD PGY1 Pharmacy Resident Phone [phone] 03/08/2019     11:35 AM

## 2019-03-08 NOTE — Progress Notes (Signed)
  Started on IV lasix earlier today with over a litter of urine output.  SBP remains soft.  CVP 18-19. CO-OX 54%.    Continue 80 mg IV lasix twice a day. No inotropes for now.   Repeat CO-OX in the morning.   Amy Clegg NP-C  4:03 PM

## 2019-03-08 NOTE — Progress Notes (Signed)
PT Cancellation Note  Patient Details Name: [name] MRN: [id number] DOB: [birth year]   Cancelled Treatment:     Discussed with OT, patient with elevated troponin, awaiting repeat labs. Will follow.  Reinaldo Berber, PT, DPT Acute Rehabilitation Services Pager: [phone] Office: [phone]      Reinaldo Berber 03/08/2019, 2:06 PM

## 2019-03-08 NOTE — Progress Notes (Signed)
OT Cancellation Note  Patient Details Name: [name] MRN: [id number] DOB: 1951-02-16   Cancelled Treatment:    Reason Eval/Treat Not Completed: Other (comment). Troponins continuing to trend up. Discussed with heart failure team Barrington Ellison). Awaiting next lab results before assessing pt.   Ramond Dial, OT/L   Acute OT Clinical Specialist Acute Rehabilitation Services Pager [phone] Office [phone]  03/08/2019, 9:04 AM

## 2019-03-08 NOTE — Progress Notes (Signed)
eLink Physician-Brief Progress Note Patient Name: [name] DOB: 1951/01/23 MRN: [id number]   Date of Service  03/08/2019  HPI/Events of Note  Hyperglycemia - Patient on [facility] Novolog coverage + Lantus. Blood glucose = 267. No HS coverage.   eICU Interventions  Will order: 1. Will change to AC/HS moderate Novolog SSI.     Intervention Category Major Interventions: Hyperglycemia - active titration of insulin therapy  Sommer,Steven Eugene 03/08/2019, 1:20 AM

## 2019-03-08 NOTE — Progress Notes (Addendum)
NAME:  [name], MRN:  [id number], DOB:  [birth year], LOS: 2 ADMISSION DATE:  03/06/2019, CONSULTATION DATE:  03/06/2019 REFERRING MD:  Dr. Mardelle Matte, CHIEF COMPLAINT:  Hypotension  Brief History   52 yoF with systolic HF and severe diabetes s/p initial fall with R ankle fracture 2/19.  F/u w/ ortho and scheduled for ORIF but found to have significant damage to splint with bleeding and open tibia fracture with purulent drainage and necrotic tissue.  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  EBL ~700.  Extubated post op but was hypotensive requiring pressors.  Labs pending.    Past Medical History  Systolic heart failure (05/7671 EF 25-30%), CAD, DM, diabetic neuropathy, panic attacks, prior breast surgery s/p R mastectomy, gastroparresis  Significant Hospital Events   2/19 initial R ankle fracture 3/12 still pressor dependent. Heart failure team evaluated in consult. CVL placed.  Consults:   Procedures:  3/9 s/p R BKA 3/9 thru 3/11: still pressor dependent. Seen by HF team Significant Diagnostic Tests:   Micro Data:  3/9 BC x 2 >>  Antimicrobials:  3/9 ancef preop 3/9 vanc >> 3/9 cefepime >>  Interim history/subjective:  Still on pressors. Pain controlled w/ PRNs.   Objective   Blood pressure (Abnormal) 106/48, pulse (Abnormal) 112, temperature 98.5 F (36.9 C), temperature source Oral, resp. rate 20, height 5\' 9"  (1.753 m), weight 98.5 kg, SpO2 98 %.        Intake/Output Summary (Last 24 hours) at 03/08/2019 1124 Last data filed at 03/08/2019 0600 Gross per 24 hour  Intake 1179.63 ml  Output 525 ml  Net 654.63 ml   Filed Weights   03/06/19 1151 03/07/19 0530  Weight: 97.5 kg 98.5 kg   Examination:  General 68 year old white female resting in bed NAD HENT NCAT no JVD MMM Pulm clear no accessory use  Card RRR no MRG abd not tender + bowel sounds Ext warm, dependent edema scattered areas of ecchymosis left BKA wrap intact Gu: cl yellow  Neuro intact    Resolved  Hospital Problem list    Assessment & Plan:    Open right ankle fracture s/p R BKA Plan Post op per ortho Try to limit narcs as able   Shock - septic vs hemorraghic, r/o cardiac process with CAD/ HFrPF hx -seen by HF team in consult today (3/11).  Plan Getting lasix  Placing CVL F/u co-ox and CVP Hold metolazone, entresto  No Ace I or ARB given AKI No BB w/ shock   CAD, HLD Plan Cont tele   AKI. Scr was rising; now better Plan  Trend chem Maximize CO Strict I&O  DM Plan:  SSI  CBG q4 Adding basal dosing   Peripheral neuropathy Plan Was on neurontin, flexeril and diazepam previously. Holding these still  GERD, gastroparesis plan Cont PPI and resume bentyl   Anemia of chronic disease Plan Trend cbc Transfuse for hgb <7  Best practice:  Diet: NPO Pain/Anxiety/Delirium protocol (if indicated): n/a VAP protocol (if indicated): n/a DVT prophylaxis: SQ heparin GI prophylaxis: PPI Glucose control: SSI Mobility: BR Code Status: Full Family Communication: no family at bedside Disposition: still critically ill. Working on titration of pressors. Appreciate HF team consult.   My cct 35 minutes.   Erick Colace ACNP-BC [facility] Pulmonary/Critical Care Pager # [phone] OR # [phone] if no answer  Attending Note:  68 year old female who suffered and ankle fracture that eventually required amputation.  Patient suffered an MI intraop and is  now in circulatory shock.  On exam, lungs are clear.  I reviewed CXR myself, mild pulmonary edema noted.  Discussed with PCCM-NP.  Will continue pressor for BP support.  IVF for resuscitation as able.  Continue abx as ordered.  F/U on cultures.  PCCM will continue to follow.  The patient is critically ill with multiple organ systems failure and requires high complexity decision making for assessment and support, frequent evaluation and titration of therapies, application of advanced monitoring technologies and extensive  interpretation of multiple databases.   Critical Care Time devoted to patient care services described in this note is  32  Minutes. This time reflects time of care of this signee Dr Jennet Maduro. This critical care time does not reflect procedure time, or teaching time or supervisory time of PA/NP/Med student/Med Resident etc but could involve care discussion time.  Rush Farmer, M.D. [facility] Pulmonary/Critical Care Medicine. Pager: [phone]. After hours pager: [phone].

## 2019-03-08 NOTE — Procedures (Signed)
Central Venous Catheter Insertion Procedure Note [name] [id number] September 27, 1951  Procedure: Insertion of Central Venous Catheter Indications: Assessment of intravascular volume  Procedure Details Consent: Risks of procedure as well as the alternatives and risks of each were explained to the (patient/caregiver).  Consent for procedure obtained. Time Out: Verified patient identification, verified procedure, site/side was marked, verified correct patient position, special equipment/implants available, medications/allergies/relevent history reviewed, required imaging and test results available.  Performed  Maximum sterile technique was used including antiseptics, cap, gloves, gown, hand hygiene, mask and sheet. Skin prep: Chlorhexidine; local anesthetic administered A antimicrobial bonded/coated triple lumen catheter was placed in the right internal jugular vein using the Seldinger technique.  Evaluation Blood flow good Complications: No apparent complications Patient did tolerate procedure well. Chest X-ray ordered to verify placement.  CXR: pending.  [name] 03/08/2019, 1:53 PM  Erick Colace ACNP-BC [place] Pager # [phone] OR # [phone] if no answer

## 2019-03-08 NOTE — Consult Note (Addendum)
Advanced Heart Failure Team Consult Note   Primary Physician: Perrin Maltese, MD PCP-Cardiologist:  No primary care provider on file.  Reason for Consultation: NSTEMI in chronic systolic CHF  HPI:    [name] is seen today for evaluation of NSTEMI in chronic systolic CHF at the request of Dr. Gwenlyn Found.   [name] is a 68 y.o. female with h/o CAD s/p LAD stent, chronic systolic CHF, DM2, neuropathy, gastroparesis, anxiety, panic attacks, and breast cancer s/p right mastectomy. Followed by Dr. Humphrey Rolls in [place].   She presented to [facility] 03/06/2019 for repair of a right ankle fracture that occurred after a fall 02/15/19. She saw ortho OP and splint was placed. She had another fall three days prior to admission but did not seek medical help. She presented for ORIF but found to have an open wound over distal medial malleolus with exposed tibia with dislocation and purulent drainage. 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 After surgery, she was extubated but hypotensive requiring neo. She was started on empiric ABX. Critical Care consulted for management of shock. Troponin was trended and found to be significantly elevated at 23 today.  Repeat troponin 29.5. Cardiology consulted but did not recommend intervention. CHF team consulted to titrate meds in setting of chronic systolic CHF.  Pertinent labs include Cr 1.73, K 5.0, WBC 19.5, Hgb 8.4. No BNP this admit. PCT 0.15 03/07/2019.   Feeling Ok this am. Denies SOB, CP, or orthopnea. Lives at home with her 55 yo daughter, who helps. She is adamantly refusing even considering rehab on discharge. Her biggest complaint currently is limb pain, including phantom limb pain.   Positive 6 L so far this admission. No weight today.   Echo 03/07/2019 LVEF 20-25%, Severe LAE, Mild RAE.   Review of systems complete and found to be negative unless listed in HPI.    Home Medications Prior to Admission medications    Medication Sig Start Date End Date Taking? Authorizing Provider  acetaminophen (TYLENOL) 500 MG tablet Take 1,000 mg by mouth every 6 (six) hours as needed for moderate pain or headache.   Yes [provider]  atorvastatin (LIPITOR) 40 MG tablet Take 40 mg by mouth at bedtime.   Yes [provider]  Cholecalciferol (VITAMIN D) 2000 units CAPS Take 2,000 Units by mouth daily.    Yes [provider]  clonazePAM (KLONOPIN) 0.5 MG tablet Take 0.5 mg by mouth daily as needed for anxiety.  08/07/18  Yes [provider]  clopidogrel (PLAVIX) 75 MG tablet Take 75 mg by mouth daily with breakfast.   Yes [provider]  cyclobenzaprine (FLEXERIL) 10 MG tablet Take 1 tablet (10 mg total) by mouth 2 (two) times daily. Patient taking differently: Take 10 mg by mouth at bedtime.  07/17/15  Yes Molli Barrows, MD  diazepam (VALIUM) 5 MG tablet Take 1 tablet (5 mg total) by mouth every 8 (eight) hours as needed for muscle spasms. 02/15/19  Yes Earleen Newport, MD  diphenoxylate-atropine (LOMOTIL) 2.5-0.025 MG tablet Take 1 tablet by mouth 4 (four) times daily as needed for diarrhea or loose stools.  11/24/17  Yes [provider]  furosemide (LASIX) 40 MG tablet Take 40 mg by mouth daily. 01/27/19  Yes [provider]  gabapentin (NEURONTIN) 600 MG tablet Take 1 tablet (600 mg total) by mouth 3 (three) times daily. 07/17/15  Yes Molli Barrows, MD  insulin aspart (NOVOLOG) 100  UNIT/ML injection Inject 20-60 Units into the skin 3 (three) times daily before meals. Per sliding scale   Yes [provider]  insulin glargine (LANTUS) 100 UNIT/ML injection Inject 60 Units into the skin 2 (two) times daily.   Yes [provider]  metoCLOPramide (REGLAN) 10 MG tablet Take 10 mg by mouth at bedtime.    Yes [provider]  metolazone (ZAROXOLYN) 2.5 MG tablet Take 2.5 mg by mouth daily. 02/13/19  Yes [provider]   oxyCODONE-acetaminophen (PERCOCET) 7.5-325 MG tablet Take 1 tablet by mouth every 4 (four) hours as needed for severe pain. 02/15/19 02/15/20 Yes Earleen Newport, MD  pantoprazole (PROTONIX) 40 MG tablet Take 40 mg by mouth daily.  10/01/18  Yes [provider]  sacubitril-valsartan (ENTRESTO) 24-26 MG Take 1 tablet by mouth 2 (two) times daily.    Yes [provider]  traMADol (ULTRAM) 50 MG tablet Take 2 tablets (100 mg total) by mouth every 8 (eight) hours. 11/28/18  Yes Molli Barrows, MD  dicyclomine (BENTYL) 10 MG capsule Take 1 capsule (10 mg total) by mouth 3 (three) times daily with meals as needed for spasms. Patient not taking: Reported on 02/21/2019 04/25/18   Arta Silence, MD  ondansetron [facility]) 4 MG tablet Take 4 mg by mouth every 8 (eight) hours as needed for nausea or vomiting.  01/07/18   [provider]   Past Medical History: Past Medical History:  Diagnosis Date  . Below-knee amputation of right lower extremity ([place]) 03/06/2019  . 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 CHF (congestive heart failure) ([place])   . Complication of anesthesia   . Coronary artery disease   . Diabetes mellitus without complication ([place])    type 2   . Fracture    right ankle  . Fracture of humerus, proximal, left, closed 02/23/2014  . Full dentures   . Myocardial infarct ([place])   . Neuropathic pain   . Panic attacks    Hx: of  . PONV (postoperative nausea and vomiting)   . Wears glasses    Past Surgical History: Past Surgical History:  Procedure Laterality Date  . APPENDECTOMY    . BREAST BIOPSY Left 02/12/15   benign, clip placed  . BREAST SURGERY Right 2012   Right mastectomy with SLN, DCIS, grade 1-2. ER/PR not performed.   [facility] CARDIAC CATHETERIZATION    . cardiac stents  2007  . CHOLECYSTECTOMY    . COLON SURGERY     Subtotal colectomy for colonic inertia.  . COLONOSCOPY    . DILATION AND CURETTAGE OF UTERUS    . EYE SURGERY    . LEFT HEART CATH AND  CORONARY ANGIOGRAPHY Left 01/14/2018   Procedure: LEFT HEART CATH AND CORONARY ANGIOGRAPHY;  Surgeon: Dionisio David, MD;  Location: [place] [facility];  Service: Cardiovascular;  Laterality: Left;  [facility] MASTECTOMY Right 2012   Hx: of right breast, DCIS   . MULTIPLE TOOTH EXTRACTIONS    . ORIF ANKLE FRACTURE Right 03/06/2019   Procedure: RIGHT BELOW KNEE AMPUTATION;  Surgeon: Marchia Bond, MD;  Location: [facility];  Service: Orthopedics;  Laterality: Right;  . ORIF HUMERUS FRACTURE Left 02/23/2014   Procedure: OPEN REDUCTION INTERNAL FIXATION (ORIF) PROXIMAL HUMERUS FRACTURE;  Surgeon: Johnny Bridge, MD;  Location: [facility];  Service: Orthopedics;  Laterality: Left;  . TUBAL LIGATION     Family History: Family History  Problem Relation Age of Onset  . Diabetes Mother   .  Hypertension Mother   . Stroke Mother   . Diabetes Brother   . Breast cancer Neg Hx    Social History: Social History   Socioeconomic History  . Marital status: Widowed    Spouse name: Not on file  . Number of children: Not on file  . Years of education: Not on file  . Highest education level: Not on file  Occupational History  . Not on file  Social Needs  . Financial resource strain: Not on file  . Food insecurity:    Worry: Not on file    Inability: Not on file  . Transportation needs:    Medical: Not on file    Non-medical: Not on file  Tobacco Use  . Smoking status: Never Smoker  . Smokeless tobacco: Never Used  Substance and Sexual Activity  . Alcohol use: No    Alcohol/week: 0.0 standard drinks  . Drug use: No  . Sexual activity: Not Currently    Birth control/protection: Post-menopausal  Lifestyle  . Physical activity:    Days per week: Not on file    Minutes per session: Not on file  . Stress: Not on file  Relationships  . Social connections:    Talks on phone: Not on file    Gets together: Not on file    Attends religious service: Not on file    Active member of club or organization: Not on  file    Attends meetings of clubs or organizations: Not on file    Relationship status: Not on file  Other Topics Concern  . Not on file  Social History Narrative  . Not on file   Allergies:  Allergies  Allergen Reactions  . Cymbalta [Duloxetine Hcl] Nausea And Vomiting and Other (See Comments)    Vomiting and stiff muscles in legs  . Toradol [Ketorolac Tromethamine] Hives   Objective:    Vital Signs:   Temp:  [97.9 F (36.6 C)-98.6 F (37 C)] 98.5 F (36.9 C) (03/11 0700) Pulse Rate:  [84-119] 112 (03/11 0645) Resp:  [17-39] 20 (03/11 0645) BP: (69-120)/(27-75) 106/48 (03/11 0645) SpO2:  [90 %-98 %] 98 % (03/11 0645) Last BM Date: 03/07/19  Weight change: Filed Weights   03/06/19 1151 03/07/19 0530  Weight: 97.5 kg 98.5 kg   Intake/Output:   Intake/Output Summary (Last 24 hours) at 03/08/2019 0822 Last data filed at 03/08/2019 0600 Gross per 24 hour  Intake 2319.63 ml  Output 527 ml  Net 1792.63 ml    Physical Exam    General:  NAD. Propped up completely in bed.  HEENT: normal Neck: supple. JVP difficult due to body habitus, but appears elevated. Carotids 2+ bilat; no bruits. No lymphadenopathy or thyromegaly appreciated. Cor: PMI nondisplaced. Regular rate & rhythm. No rubs, gallops or murmurs. Lungs: Diminished basilar sounds.  Abdomen: soft, nontender, nondistended. No hepatosplenomegaly. No bruits or masses. Good bowel sounds. Extremities: no cyanosis or clubbing. S/p R BKA. Dressing C/D/I. LUE with scattered ecchymosis. 2+ edema into thighs of LLE.  Neuro: alert & orientedx3, cranial nerves grossly intact. moves all 4 extremities w/o difficulty. Affect pleasant  Telemetry   Sinus tach 100-110s, personally reviewed.   EKG    Sinus tach 114 bpm, RBBB with PVCs QRS 130, personally reviewed  Labs   Basic Metabolic Panel: Recent Labs  Lab 03/06/19 1904 03/06/19 2155 03/07/19 0616 03/07/19 1541 03/08/19 0310  NA 135  --  133* 132* 132*  K 4.9  --   6.0* 5.4* 5.0  CL 103  --  104 104 104  CO2 22  --  17* 18* 21*  GLUCOSE 180*  --  293* 275* 218*  BUN 22  --  26* 34* 34*  CREATININE 1.27* 1.29* 1.45* 1.82* 1.73*  CALCIUM 8.5*  --  8.1* 7.7* 8.3*  MG  --   --  1.7  --  1.8  PHOS  --   --  4.3  --  2.8    Liver Function Tests: No results for input(s): AST, ALT, ALKPHOS, BILITOT, PROT, ALBUMIN in the last 168 hours. No results for input(s): LIPASE, AMYLASE in the last 168 hours. No results for input(s): AMMONIA in the last 168 hours.  CBC: Recent Labs  Lab 03/06/19 1904 03/06/19 2155 03/07/19 0616 03/07/19 1541 03/07/19 2146 03/08/19 0310  WBC 20.5* 18.1* 23.5* 14.9*  --  19.5*  NEUTROABS 18.2*  --   --   --   --   --   HGB 6.4* 8.2* 8.8* 6.9* 8.2* 8.4*  HCT 21.6* 26.5* 29.4* 22.6* 26.6* 26.9*  MCV 84.0 83.9 85.7 84.3  --  82.8  PLT 364 345 403* 251  --  307   Cardiac Enzymes: Recent Labs  Lab 03/06/19 1904 03/07/19 0033 03/07/19 0616 03/07/19 1114  TROPONINI 0.03* 2.08* 23.21* 29.49*   BNP: BNP (last 3 results) Recent Labs    02/09/19 1322  BNP 706.0*   ProBNP (last 3 results) No results for input(s): PROBNP in the last 8760 hours.  CBG: Recent Labs  Lab 03/07/19 1526 03/07/19 1841 03/07/19 1946 03/08/19 0009 03/08/19 0751  GLUCAP 247* 254* 240* 267* 192*   Coagulation Studies: No results for input(s): LABPROT, INR in the last 72 hours.  Imaging    No results found.  Medications:    Current Medications: . atorvastatin  40 mg Oral QHS  . cholecalciferol  2,000 Units Oral Daily  . docusate sodium  100 mg Oral BID  . gabapentin  600 mg Oral TID  . insulin aspart  0-15 Units Subcutaneous TID WC  . insulin aspart  0-5 Units Subcutaneous QHS  . insulin glargine  60 Units Subcutaneous BID  . metoCLOPramide  10 mg Oral QHS  . midodrine  5 mg Oral TID WC  . pantoprazole  40 mg Oral Daily  . traMADol  100 mg Oral TID     Infusions: . sodium chloride Stopped (03/07/19 1723)  . ceFEPime  (MAXIPIME) IV    . heparin 1,200 Units/hr (03/08/19 0600)  . norepinephrine (LEVOPHED) Adult infusion 4 mcg/min (03/08/19 0600)  . vancomycin 750 mg (03/07/19 2331)     Patient Profile   [name] is a 68 y.o. female with h/o CAD s/p LAD stent, chronic systolic CHF, DM2, neuropathy, gastroparesis, anxiety, panic attacks, and breast cancer s/p right mastectomy.   Suffered an NSTEMI in setting of R BKA. Troponin peaked at 23. Dr. Gwenlyn Found assessed and recommended no intervention at this time with relative lack of symptoms. CHF team consulted for med titratino.   Assessment/Plan   1. Elevated troponin, known CAD, recent heart cath 12/2017 - 0.03 --> 2.08 --> 23.21 -> 29.5 - EKG with RBBB (not new) and nonspecific ST changes that do not appear new when compared to previous tracings - Most recent 01/14/18 with moderate stenosis in the distal LAD (60%) and OM1 (50%) and patent stent in the mid LAD - Continue ASA and plavix.  - Denies CP.  - in the absence of symptoms and EKG changes,  will defer repeat heart catheterization at this time.  - Continue Heparin.  - Will need to restart plavix when OK with ortho - Will plan repeat LHC once shock resolved.   2. Septic shock in setting of R ankle infection; Now s/p R BKA.  - Covering with Vanc/Cefepime.  - Home HTN med held.  - Phenylephrine stopped yesterday. Now on NE at 4.  - Also newly on midodrine 5 mg TID - If cardiogenic component suspected, could place central access for CVP/Coox.   3. Acute on chronic systolic heart failure, ICM as above - Echo 03/07/2019 LVEF 20-25%, Severe LAE, Mild RAE.  - Volume status elevated.  - Will give 80 mg IV lasix x 1 and place PICC line for Coox/CVP.  - No ACE/ARB in setting of AKI.  - No BB for now with acute decompensation.   4. DM - uncontrolled - A1c 7.8% last month. Per primary.   5. AKI - Cr 1.29 -> 1.45 -> 1.82 -> 1.73. Follow.   6. Right BKA - Per Ortho. Stable.  - Pt is  refusing rehab.   Volume overloaded on exam. Will diurese and follow kidney function. Will plan L/RHC on Friday if creatinine improves. Pt aware.   Medication concerns reviewed with patient and pharmacy team. Barriers identified: None at this time.   Length of Stay: 2  Annamaria Helling  03/08/2019, 8:22 AM  Advanced Heart Failure Team Pager [phone] (M-F; 7a - 4p)  Please contact [facility] for night-coverage after hours (4p -7a ) and weekends on [email or web address]  Patient seen with PA, agree with the above note.  She has known CAD with prior LAD stent and ischemic cardiomyopathy.  Had right ankle fracture but complications ensued as above and she ended up having right BKA on 3/9.  Post-op, she has hypotensive requiring phenylephrine.  No chest pain. Troponin was drawn, noted to be up to 29. ECG looked similar to the past, NSR with RBBB and old anterior MI.  She was seen by [facility] cardiology and echo was done, showing EF 20-25%.  CHF service consulted for HF management. She is currently on norepinephrine 2 + midodrine 5 tid. Creatinine up to 1.8, now 1.7.   On exam, JVP elevated with 2+ edema to knee on left.  S/p right BKA.  Regular S1S2 mildly tachy.   1. CAD: Patient has history of prior anterior MI with LAD stent.  She developed hypotension but no significant ECG changes or chest pain post-op 3/9.  Troponin up to 29, now trending down.  Suspect peri-op NSTEMI.   - Once septic shock is resolved and creatinine stabilizes, will need left/right heart cath.  - Continue heparin gtt, add ASA 81, continue statin.  - Can add back on Plavix as long as surgeons ok with that.  2. Shock: Suspect mixed septic/cardiogenic.  Septic shock with osteomyelitis, WBCs 19 but afebrile, no culture data available yet.  - Getting broad spectrum abx (vancomycin/cefepime).  - Support BP as needed with norepinephrine, now titrating off.  - Placing CVL to follow co-ox and CVP.  3. Acute on chronic systolic  CHF: Ischemic cardiomyopathy.  Apparently, baseline EF is in the 25-30% range.  Echo this admission with EF 20-25%. She is volume overloaded on exam.  - Titrate off norepinephrine as above as BP allows.  Hopefully can get her off midodrine.  - Lasix 80 mg IV bid for now.  - Placing PICC for CVP/co-ox.  4. AKI: Creatinine up to 1.8,  now 1.7.  Suspect prerenal in setting of septic shock.   Loralie Champagne 03/08/2019 1:46 PM

## 2019-03-08 NOTE — Progress Notes (Signed)
Occupational Therapy Evaluation  PTA, pt lived with her daughter and was independent with ADL and mobility. Pt able to mobilize to EOB and stand @ 6 times with +2 mod A. Pt declined to mobilize to chair today. Pt currently mod A +2 with sit - stand, Mod A with UB ADL and Max A with LB ADL. BP low during session - nsg aware and administered medication. See vitals. Although fatigued, pt tolerated bed level session well. Feel pt is an excellent CIR candidate to DC home @ wc level with supervision of her daughter. Will continue to follow acutely.     03/08/19 1600  OT Visit Information  Last OT Received On 03/08/19  Assistance Needed +2  PT/OT/SLP Co-Evaluation/Treatment Yes  Reason for Co-Treatment Complexity of the patient's impairments (multi-system involvement);For patient/therapist safety;To address functional/ADL transfers  OT goals addressed during session ADL's and self-care  History of Present Illness 76 yoF with systolic HF and severe diabetes s/p initial fall with R ankle fracture 2/19.  F/u w/ ortho and scheduled for ORIF but found to have significant damage to splint with bleeding and open tibia fracture with purulent drainage and necrotic tissue.  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  EBL ~700.  Extubated post op but was hypotensive requiring pressors.  Labs pending.   Precautions  Precautions Fall;Other (comment) (R BKA; watch BP)  Home Living  Family/patient expects to be discharged to: Private residence  Living Arrangements Children  Available Help at Discharge Available 24 hours/day;Family  Type of [place] to enter  Entrance Stairs-Number of Steps 2  Entrance Stairs-Rails None  Home Layout One level  Bathroom Shower/Tub Tub/shower unit;Curtain  Corporate treasurer No  Home Data processing manager - standard  Prior Function  Level of Independence Independent  Comments drives;   Communication  Communication No  difficulties  Pain Assessment  Pain Assessment Faces  Faces Pain Scale 4  Pain Location general discomfort  Pain Descriptors / Indicators Aching;Grimacing;Guarding  Pain Intervention(s) Limited activity within patient's tolerance  Cognition  Arousal/Alertness Awake/alert  Behavior During Therapy Anxious  Overall Cognitive Status Within Functional Limits for tasks assessed  Upper Extremity Assessment  Upper Extremity Assessment Generalized weakness  Lower Extremity Assessment  Lower Extremity Assessment Defer to PT evaluation  Cervical / Trunk Assessment  Cervical / Trunk Assessment Normal  ADL  Overall ADL's  Needs assistance/impaired  Grooming Moderate assistance;Sitting  Upper Body Bathing Moderate assistance;Sitting  Lower Body Bathing Maximal assistance;Sitting/lateral leans  Upper Body Dressing  Moderate assistance;Sitting  Lower Body Dressing Maximal assistance;Sitting/lateral leans  Toilet Transfer +2 for physical assistance;Moderate assistance  Toileting- Clothing Manipulation and Hygiene Total assistance  Functional mobility during ADLs Moderate assistance;+2 for physical assistance (sit - stand only)  Vision- History  Baseline Vision/History Retinopathy  Patient Visual Report No change from baseline  Vision- Assessment  Additional Comments unable to see to read  Bed Mobility  Overal bed mobility Needs Assistance  Bed Mobility Rolling;Sidelying to Sit;Sit to Supine  Rolling Min assist  Sidelying to sit Mod assist  Sit to supine Mod assist;+2 for physical assistance  General bed mobility comments anxious about bed mobility  Transfers  Overall transfer level Needs assistance  Transfers Sit to/from Stand  Sit to Stand Mod assist;+2 physical assistance  General transfer comment Able to stand and scoot bottom toward Edge of bed wtih +2 mod A. Difficulty with proprioceptive input of L foot due to neuropathy  Balance  Overall balance  assessment History of Falls;Needs  assistance  Sitting balance-Leahy Scale Fair  Standing balance-Leahy Scale Poor  Exercises  Exercises General Upper Extremity  General Exercises - Upper Extremity  Shoulder Flexion AROM;Both;10 reps  Elbow Flexion Both;AROM;10 reps  Elbow Extension AROM;Both;10 reps  Digit Composite Flexion Both;10 reps  Composite Extension Both;10 reps  OT - End of Session  Activity Tolerance Patient tolerated treatment well  Patient left in bed;with call bell/phone within reach;with bed alarm set;with SCD's reapplied  Nurse Communication Mobility status;Precautions  OT Assessment  OT Recommendation/Assessment Patient needs continued OT Services  OT Visit Diagnosis Other abnormalities of gait and mobility (R26.89);Muscle weakness (generalized) (M62.81);Pain  Pain - Right/Left Right  Pain - part of body Leg  OT Problem List Decreased strength;Decreased range of motion;Decreased activity tolerance;Impaired balance (sitting and/or standing);Impaired vision/perception;Decreased safety awareness;Decreased knowledge of use of DME or AE;Decreased knowledge of precautions;Cardiopulmonary status limiting activity;Obesity;Pain;Increased edema  OT Plan  OT Frequency (ACUTE ONLY) Min 2X/week  OT Treatment/Interventions (ACUTE ONLY) Self-care/ADL training;Therapeutic exercise;Energy conservation;DME and/or AE instruction;Therapeutic activities;Patient/family education;Balance training  AM-PAC OT "6 Clicks" Daily Activity Outcome Measure (Version 2)  Help from another person eating meals? 4  Help from another person taking care of personal grooming? 3  Help from another person toileting, which includes using toliet, bedpan, or urinal? 1  Help from another person bathing (including washing, rinsing, drying)? 2  Help from another person to put on and taking off regular upper body clothing? 2  Help from another person to put on and taking off regular lower body clothing? 2  6 Click Score 14  OT Recommendation   Recommendations for Other Services Rehab consult  Follow Up Recommendations CIR;Supervision/Assistance - 24 hour  OT Equipment 3 in 1 bedside commode;Tub/shower bench;Wheelchair (measurements OT);Wheelchair cushion (measurements OT) (drop arm)  Individuals Consulted  Consulted and Agree with Results and Recommendations Patient  Acute Rehab OT Goals  Patient Stated Goal to go to rehab at  the hospital  OT Goal Formulation With patient  Time For Goal Achievement 03/22/19  Potential to Achieve Goals Good  OT Time Calculation  OT Start Time (ACUTE ONLY) 1610  OT Stop Time (ACUTE ONLY) 1652  OT Time Calculation (min) 42 min  OT General Charges  $OT Visit 1 Visit  OT Evaluation  $OT Eval High Complexity 1 High  Written Expression  Dominant Hand Right  Maurie Boettcher, OT/L   Acute OT Clinical Specialist Acute Rehabilitation Services Pager [phone] Office [phone]

## 2019-03-08 NOTE — Evaluation (Addendum)
Physical Therapy Evaluation Patient Details Name: [name] MRN: [id number] DOB: [birth year] Today's Date: 03/08/2019   History of Present Illness  39 yoF with systolic HF and severe diabetes s/p initial fall with R ankle fracture 2/19.  F/u w/ ortho and scheduled for ORIF but found to have significant damage to splint with bleeding and open tibia fracture with purulent drainage and necrotic tissue.  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  EBL ~700.  Extubated post op but was hypotensive requiring pressors.  Labs pending.     Clinical Impression  Pt admitted with above diagnosis. Pt currently with functional limitations due to the deficits listed below (see PT Problem List). PTA patient independent with all mobility living with daughter. Today anxious and overwhelmed with loss of limb and at times self limiting with fear of falling. encouraged throughout session and responded positively to therapy. Standing several times EOB with +2 mod A. Discussed goals and  course of rehab and goals and therex.   Pt will benefit from skilled PT to increase their independence and safety with mobility to allow discharge to the venue listed below.    VSS    Follow Up Recommendations CIR    Equipment Recommendations  Wheelchair (measurements PT);Rolling walker with 5" wheels(TBD)    Recommendations for Other Services Rehab consult     Precautions / Restrictions Precautions Precautions: Fall;Other (comment)(R BKA) Restrictions Weight Bearing Restrictions: Yes      Mobility  Bed Mobility Overal bed mobility: Needs Assistance Bed Mobility: Rolling;Sidelying to Sit;Sit to Supine Rolling: Min assist Sidelying to sit: Mod assist   Sit to supine: Mod assist;+2 for physical assistance   General bed mobility comments: anxious about bed mobility  Transfers Overall transfer level: Needs assistance   Transfers: Sit to/from Stand Sit to Stand: Mod assist;+2 physical assistance         General  transfer comment: Able to stand and scoot bottom toward Edge of bed wtih +2 mod A. Difficulty with proprioceptive input of L foot due to neuropathy  Ambulation/Gait                Stairs            Wheelchair Mobility    Modified Rankin (Stroke Patients Only)       Balance Overall balance assessment: History of Falls;Needs assistance   Sitting balance-Leahy Scale: Fair       Standing balance-Leahy Scale: Poor                               Pertinent Vitals/Pain Pain Assessment: Faces Faces Pain Scale: Hurts little more Pain Location: general discomfort Pain Descriptors / Indicators: Aching;Grimacing;Guarding Pain Intervention(s): Limited activity within patient's tolerance;Monitored during session    Home Living Family/patient expects to be discharged to:: Private residence Living Arrangements: Children Available Help at Discharge: Available 24 hours/day;Family Type of Home: House Home Access: Stairs to enter Entrance Stairs-Rails: None Technical brewer of Steps: 2 Home Layout: One level Home Equipment: Surveyor, minerals - standard      Prior Function Level of Independence: Independent         Comments: drives;      Hand Dominance   Dominant Hand: Right    Extremity/Trunk Assessment   Upper Extremity Assessment Upper Extremity Assessment: Generalized weakness    Lower Extremity Assessment Lower Extremity Assessment: Generalized weakness(R BKA. LLE strength 4-/5)    Cervical / Trunk Assessment Cervical / Trunk Assessment:  Normal  Communication   Communication: No difficulties  Cognition Arousal/Alertness: Awake/alert Behavior During Therapy: WFL for tasks assessed/performed Overall Cognitive Status: Within Functional Limits for tasks assessed                                        General Comments      Exercises General Exercises - Upper Extremity Shoulder Flexion: AROM;Both;10  reps Elbow Flexion: Both;AROM;10 reps Elbow Extension: AROM;Both;10 reps Digit Composite Flexion: Both;10 reps Composite Extension: Both;10 reps General Exercises - Lower Extremity Ankle Circles/Pumps: 10 reps Quad Sets: 10 reps Heel Slides: 10 reps Hip ABduction/ADduction: 10 reps Amputee Exercises Hip Extension: 10 reps Hip ABduction/ADduction: 10 reps Knee Extension: 10 reps   Assessment/Plan    PT Assessment Patient needs continued PT services  PT Problem List Decreased strength       PT Treatment Interventions DME instruction;Gait training;Stair training;Functional mobility training;Therapeutic activities;Therapeutic exercise;Balance training    PT Goals (Current goals can be found in the Care Plan section)  Acute Rehab PT Goals Patient Stated Goal: to go to rehab at  the hospital PT Goal Formulation: With patient Time For Goal Achievement: 03/22/19 Potential to Achieve Goals: Fair    Frequency Min 3X/week   Barriers to discharge Decreased caregiver support      Co-evaluation PT/OT/SLP Co-Evaluation/Treatment: Yes Reason for Co-Treatment: Complexity of the patient's impairments (multi-system involvement)   OT goals addressed during session: ADL's and self-care       AM-PAC PT "6 Clicks" Mobility  Outcome Measure Help needed turning from your back to your side while in a flat bed without using bedrails?: None Help needed moving from lying on your back to sitting on the side of a flat bed without using bedrails?: None Help needed moving to and from a bed to a chair (including a wheelchair)?: None Help needed standing up from a chair using your arms (e.g., wheelchair or bedside chair)?: A Little Help needed to walk in hospital room?: A Little Help needed climbing 3-5 steps with a railing? : A Little 6 Click Score: 21    End of Session Equipment Utilized During Treatment: Gait belt Activity Tolerance: Patient tolerated treatment well Patient left: in  bed Nurse Communication: Mobility status PT Visit Diagnosis: Unsteadiness on feet (R26.81)    Time: 1625-1710 PT Time Calculation (min) (ACUTE ONLY): 45 min   Charges:   PT Evaluation $PT Eval High Complexity: 1 High PT Treatments $Therapeutic Activity: 8-22 mins        Reinaldo Berber, PT, DPT Acute Rehabilitation Services Pager: [phone] Office: [phone]    Reinaldo Berber 03/08/2019, 6:54 PM

## 2019-03-09 DIAGNOSIS — E1142 Type 2 diabetes mellitus with diabetic polyneuropathy: Secondary | ICD-10-CM

## 2019-03-09 DIAGNOSIS — E1165 Type 2 diabetes mellitus with hyperglycemia: Secondary | ICD-10-CM

## 2019-03-09 DIAGNOSIS — R570 Cardiogenic shock: Secondary | ICD-10-CM

## 2019-03-09 DIAGNOSIS — S88111A Complete traumatic amputation at level between knee and ankle, right lower leg, initial encounter: Secondary | ICD-10-CM

## 2019-03-09 DIAGNOSIS — I739 Peripheral vascular disease, unspecified: Secondary | ICD-10-CM

## 2019-03-09 LAB — BASIC METABOLIC PANEL
Anion gap: 8 (ref 5–15)
BUN: 34 mg/dL — ABNORMAL HIGH (ref 8–23)
CO2: 22 mmol/L (ref 22–32)
Calcium: 8.4 mg/dL — ABNORMAL LOW (ref 8.9–10.3)
Chloride: 104 mmol/L (ref 98–111)
Creatinine, Ser: 1.63 mg/dL — ABNORMAL HIGH (ref 0.44–1.00)
GFR calc Af Amer: 37 mL/min — ABNORMAL LOW (ref >60)
GFR calc non Af Amer: 32 mL/min — ABNORMAL LOW (ref >60)
GLUCOSE: 172 mg/dL — AB (ref 70–99)
Potassium: 4 mmol/L (ref 3.5–5.1)
Sodium: 134 mmol/L — ABNORMAL LOW (ref 135–145)

## 2019-03-09 LAB — COOXEMETRY PANEL
Carboxyhemoglobin: 1.4 % (ref 0.5–1.5)
Carboxyhemoglobin: 1.7 % — ABNORMAL HIGH (ref 0.5–1.5)
METHEMOGLOBIN: 1 % (ref 0.0–1.5)
Methemoglobin: 1 % (ref 0.0–1.5)
O2 SAT: 46.4 %
O2 Saturation: 55.7 %
Total hemoglobin: 8.3 g/dL — ABNORMAL LOW (ref 12.0–16.0)
Total hemoglobin: 8.8 g/dL — ABNORMAL LOW (ref 12.0–16.0)

## 2019-03-09 LAB — GLUCOSE, CAPILLARY
GLUCOSE-CAPILLARY: 210 mg/dL — AB (ref 70–99)
Glucose-Capillary: 157 mg/dL — ABNORMAL HIGH (ref 70–99)
Glucose-Capillary: 246 mg/dL — ABNORMAL HIGH (ref 70–99)
Glucose-Capillary: 193 mg/dL — ABNORMAL HIGH (ref 70–99)

## 2019-03-09 LAB — CBC
HEMATOCRIT: 25.2 % — AB (ref 36.0–46.0)
Hemoglobin: 7.8 g/dL — ABNORMAL LOW (ref 12.0–15.0)
MCH: 26.7 pg (ref 26.0–34.0)
MCHC: 31 g/dL (ref 30.0–36.0)
MCV: 86.3 fL (ref 80.0–100.0)
Platelets: 308 10*3/uL (ref 150–400)
RBC: 2.92 MIL/uL — ABNORMAL LOW (ref 3.87–5.11)
RDW: 16.9 % — ABNORMAL HIGH (ref 11.5–15.5)
WBC: 16.6 10*3/uL — ABNORMAL HIGH (ref 4.0–10.5)
nRBC: 0.2 % (ref 0.0–0.2)

## 2019-03-09 LAB — HEPARIN LEVEL (UNFRACTIONATED): Heparin Unfractionated: 0.32 IU/mL (ref 0.30–0.70)

## 2019-03-09 MED ORDER — SODIUM CHLORIDE 0.9 % IV SOLN
INTRAVENOUS | Status: DC
  Administered 2019-03-10: 07:00:00 via INTRAVENOUS

## 2019-03-09 MED ORDER — SODIUM CHLORIDE 0.9% FLUSH: 3.0000 mL | INTRAVENOUS | Status: DC | PRN

## 2019-03-09 MED ORDER — SODIUM CHLORIDE 0.9 % IV SOLN: 250.0000 mL | INTRAVENOUS | Status: DC | PRN

## 2019-03-09 MED ORDER — CLOPIDOGREL BISULFATE 75 MG PO TABS
75.0000 mg | ORAL_TABLET | Freq: Every day | ORAL | Status: DC
  Administered 2019-03-09 – 2019-03-13 (×5): 75 mg via ORAL
  Filled 2019-03-09 (×5): qty 1

## 2019-03-09 MED ORDER — DIGOXIN 125 MCG PO TABS
0.1250 mg | ORAL_TABLET | Freq: Every day | ORAL | Status: DC
  Administered 2019-03-09 – 2019-03-13 (×5): 0.125 mg via ORAL
  Filled 2019-03-09 (×5): qty 1

## 2019-03-09 MED ORDER — FUROSEMIDE 10 MG/ML IJ SOLN
15.0000 mg/h | INTRAVENOUS | Status: DC
  Administered 2019-03-09 – 2019-03-10 (×2): 15 mg/h via INTRAVENOUS
  Filled 2019-03-09: qty 20
  Filled 2019-03-09: qty 25

## 2019-03-09 MED ORDER — SODIUM CHLORIDE 0.9% FLUSH
3.0000 mL | Freq: Two times a day (BID) | INTRAVENOUS | Status: DC
  Administered 2019-03-10: 3 mL via INTRAVENOUS

## 2019-03-09 MED ORDER — HEPARIN (PORCINE) 25000 UT/250ML-% IV SOLN
1200.0000 [IU]/h | INTRAVENOUS | Status: DC
  Administered 2019-03-09 – 2019-03-10 (×2): 1200 [IU]/h via INTRAVENOUS
  Filled 2019-03-09: qty 250

## 2019-03-09 NOTE — Progress Notes (Signed)
Occupational Therapy Treatment Patient Details Name: [name] MRN: [id number] DOB: 1951/09/29 Today's Date: 03/09/2019    History of present illness 32 yoF with systolic HF and severe diabetes s/p initial fall with R ankle fracture 2/19.  F/u w/ ortho and scheduled for ORIF but found to have significant damage to splint with bleeding and open tibia fracture with purulent drainage and necrotic tissue.  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  EBL ~700.  Extubated post op but was hypotensive requiring pressors.  Labs pending.    OT comments  Focus of session on establishing bed level theraband exercise program for trunk and BUE. Began education on desensitization of R residual limb and appropriate positioning.  Good participation. Increased tolerance as compared to yesterday. Encouraged pt to move OOB later today with nsg or tomorrow with PT. Pt agreed to increase mobility. Pt discussed her feelings of loss regarding her RLE. States "I wish I would have been in a car accident. I can't believe I did this to myself because I was so stupid". Active listening provided and encouraged pt to work hard to regain independence.  Feel pt would benefit from [place] visit.  Continue to recommend CIR. Pt very motivated to return to PLOF @ wc level.   Follow Up Recommendations  CIR;Supervision/Assistance - 24 hour    Equipment Recommendations  3 in 1 bedside commode;Tub/shower bench;Wheelchair (measurements OT);Wheelchair cushion (measurements OT)    Recommendations for Other Services Rehab consult    Precautions / Restrictions Precautions Precautions: Fall;Other (comment)(watch BP; R BKA)       Mobility Bed Mobility Overal bed mobility: Needs Assistance   Rolling: Supervision         General bed mobility comments: rolling R/L wtih use of rails. VC to not push through RLE  Transfers                      Balance                                           ADL  either performed or assessed with clinical judgement   ADL       Grooming: Set up;Bed level                                 General ADL Comments: Movement patterns in preparation for LB ADL at bed level. HOB elevated.Pt  unable to place LLE ontop of knee. Will most likely need to use AE. Unable to feel L foot. Began education regarding residual limb desensitization.      Vision   Additional Comments: poor vision; unable to read; unsure if she can use magnifier   Perception     Praxis      Cognition Arousal/Alertness: Awake/alert Behavior During Therapy: WFL for tasks assessed/performed Overall Cognitive Status: Within Functional Limits for tasks assessed                                          Exercises Exercises: General Upper Extremity General Exercises - Upper Extremity Shoulder Flexion: Strengthening;Both;20 reps;Seated;Theraband(bed level) Theraband Level (Shoulder Flexion): Level 1 (Yellow) Shoulder Extension: Strengthening;Both;15 reps;Seated;Theraband Theraband Level (Shoulder Extension): Level 1 (Yellow) Shoulder ABduction: Strengthening;Both;15 reps;Theraband Theraband Level (  Shoulder Abduction): Level 1 (Yellow) Shoulder ADduction: Strengthening;Both;15 reps;Seated;Theraband Theraband Level (Shoulder Adduction): Level 1 (Yellow) Shoulder Horizontal ABduction: Both;15 reps;Seated;Theraband Theraband Level (Shoulder Horizontal Abduction): Level 1 (Yellow) Elbow Flexion: Both;20 reps;Seated;Theraband Theraband Level (Elbow Flexion): Level 1 (Yellow) Elbow Extension: Both;20 reps;Seated;Theraband Theraband Level (Elbow Extension): Level 1 (Yellow)   Shoulder Instructions       General Comments      Pertinent Vitals/ Pain       Pain Assessment: Faces Faces Pain Scale: Hurts little more Pain Location: B arms; RLE Pain Descriptors / Indicators: Aching;Grimacing;Guarding Pain Intervention(s): Limited activity within patient's  tolerance  Home Living                                          Prior Functioning/Environment              Frequency  Min 2X/week        Progress Toward Goals  OT Goals(current goals can now be found in the care plan section)  Progress towards OT goals: Progressing toward goals  Acute Rehab OT Goals Patient Stated Goal: to go to rehab at  the hospital OT Goal Formulation: With patient Time For Goal Achievement: 03/22/19 Potential to Achieve Goals: Good ADL Goals Pt Will Perform Grooming: with set-up;sitting Pt Will Perform Upper Body Bathing: with set-up;sitting Pt Will Perform Lower Body Bathing: sitting/lateral leans;with set-up;with supervision;with adaptive equipment Pt Will Transfer to Toilet: with min assist;bedside commode;squat pivot transfer Pt/caregiver will Perform Home Exercise Program: Increased strength;Both right and left upper extremity;With theraband;With written HEP provided;Independently Additional ADL Goal #1: Pt will independently verbbalize 2 desensitization techniques for residual limb  Plan Discharge plan remains appropriate    Co-evaluation                 AM-PAC OT "6 Clicks" Daily Activity     Outcome Measure   Help from another person eating meals?: None Help from another person taking care of personal grooming?: A Little Help from another person toileting, which includes using toliet, bedpan, or urinal?: Total Help from another person bathing (including washing, rinsing, drying)?: A Lot Help from another person to put on and taking off regular upper body clothing?: A Little Help from another person to put on and taking off regular lower body clothing?: A Lot 6 Click Score: 15    End of Session    OT Visit Diagnosis: Other abnormalities of gait and mobility (R26.89);Muscle weakness (generalized) (M62.81);Pain Pain - Right/Left: Right Pain - part of body: Leg   Activity Tolerance Patient tolerated treatment  well   Patient Left in bed;with call bell/phone within reach   Nurse Communication Mobility status;Other (comment)(encourage theraband ex)        Time: 6378-5885 OT Time Calculation (min): 24 min  Charges: OT General Charges $OT Visit: 1 Visit OT Treatments $Therapeutic Activity: 8-22 mins $Therapeutic Exercise: 8-22 mins  Maurie Boettcher, OT/L   Acute OT Clinical Specialist [place] Pager [phone] Office [phone]    [facility] 03/09/2019, 1:57 PM

## 2019-03-09 NOTE — Progress Notes (Signed)
Rehab Admissions Coordinator Note:  Patient was screened by Retta Diones for appropriateness for an Inpatient Acute Rehab Consult.  At this time, we are recommending Inpatient Rehab consult.  Retta Diones 03/09/2019, 10:52 AM  I can be reached at [phone]

## 2019-03-09 NOTE — Progress Notes (Signed)
NAME:  [name], MRN:  [id number], DOB:  1951-04-13, LOS: 3 ADMISSION DATE:  03/06/2019, CONSULTATION DATE:  03/06/2019 REFERRING MD:  Dr. Mardelle Matte, CHIEF COMPLAINT:  Hypotension  Brief History   70 yoF with systolic HF and severe diabetes s/p initial fall with R ankle fracture 2/19.  F/u w/ ortho and scheduled for ORIF but found to have significant damage to splint with bleeding and open tibia fracture with purulent drainage and necrotic tissue.  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  EBL ~700.  Extubated post op but was hypotensive requiring pressors.  Labs pending.    Past Medical History  Systolic heart failure (03/4817 EF 25-30%), CAD, DM, diabetic neuropathy, panic attacks, prior breast surgery s/p R mastectomy, gastroparresis  Significant Hospital Events   2/19 initial R ankle fracture 3/12 still pressor dependent. Heart failure team evaluated in consult. CVL placed.   Consults:   Procedures:  3/9 s/p R BKA 3/9 thru 3/11: still pressor dependent. Seen by HF team  Significant Diagnostic Tests:   Micro Data:  3/9 BC x 2 >>NTD  Antimicrobials:  3/9 ancef preop 3/9 vanc >>3/12 3/9 cefepime >>  Interim history/subjective:  Remains on pressors, stable, no complaints and no events  Objective   Blood pressure 118/70, pulse (!) 107, temperature 98 F (36.7 C), temperature source Oral, resp. rate (!) 23, height 5\' 9"  (1.753 m), weight 99.7 kg, SpO2 96 %. CVP:  [21 mmHg-26 mmHg] 22 mmHg      Intake/Output Summary (Last 24 hours) at 03/09/2019 1139 Last data filed at 03/09/2019 0700 Gross per 24 hour  Intake 865.21 ml  Output 3550 ml  Net -2684.79 ml   Filed Weights   03/06/19 1151 03/07/19 0530 03/09/19 0500  Weight: 97.5 kg 98.5 kg 99.7 kg   Examination:  General: Tired, NAD HENT: [place]/AT, PERRL, EOM-I and MMM Pulm: CTA bilaterally Card: RRR, Nl S1/S2 and -M/R/G Abd: soft, NT, ND and +BS Ext warm, dependent edema scattered areas of ecchymosis left BKA wrap intact  Gu: cl yellow  Neuro intact   I reviewed CXR myself, pulmonary edema noted  Resolved Hospital Problem list    Assessment & Plan:    Open right ankle fracture s/p R BKA Plan Post op per ortho Limit narcs as able  Shock - septic vs hemorraghic, r/o cardiac process with CAD/ HFrPF hx -seen by HF team in consult today (3/11).  Plan F/u co-ox and CVP ? Milrinone Continue levo for BP support Hold lasix Hold metolazone, entresto  No Ace I or ARB given AKI No BB w/ shock   CAD, HLD Plan Tele monitoring Cath likely on Monday  AKI. Scr was rising; now better Plan  Trend chem Maximize CO Strict I&O  DM Plan:  SSI  CBG q4 Adding basal dosing   Peripheral neuropathy Plan Was on neurontin, flexeril and diazepam previously. Holding these still  GERD, gastroparesis plan Cont PPI and resume bentyl   Anemia of chronic disease Plan Trend cbc Transfuse for hgb <7  Hold in the ICU for pressor support  The patient is critically ill with multiple organ systems failure and requires high complexity decision making for assessment and support, frequent evaluation and titration of therapies, application of advanced monitoring technologies and extensive interpretation of multiple databases.   Critical Care Time devoted to patient care services described in this note is  32  Minutes. This time reflects time of care of this signee Dr Jennet Maduro. This critical care time does not  reflect procedure time, or teaching time or supervisory time of PA/NP/Med student/Med Resident etc but could involve care discussion time.  Rush Farmer, M.D. [facility] Pulmonary/Critical Care Medicine. Pager: [phone]. After hours pager: [phone].

## 2019-03-09 NOTE — Progress Notes (Addendum)
Advanced Heart Failure Rounding Note  PCP-Cardiologist: No primary care provider on file.   Subjective:    Negative 2 L though weight shows up 2 lbs since last weight 2 days ago. Cr 1.7 -> 1.6  Feeling a little better. Remains orthopneic. Leg pain worse in the ams.   CVP 18-19. Coox 46.4%. Repeat pending.   Objective:   Weight Range: 99.7 kg Body mass index is 32.46 kg/m.   Vital Signs:   Temp:  [97.3 F (36.3 C)-99.3 F (37.4 C)] 99.3 F (37.4 C) (03/12 0400) Pulse Rate:  [86-120] 107 (03/12 0700) Resp:  [15-26] 23 (03/12 0700) BP: (74-118)/(38-92) 118/70 (03/12 0700) SpO2:  [93 %-98 %] 96 % (03/12 0700) Weight:  [99.7 kg] 99.7 kg (03/12 0500) Last BM Date: 03/07/19  Weight change: Filed Weights   03/06/19 1151 03/07/19 0530 03/09/19 0500  Weight: 97.5 kg 98.5 kg 99.7 kg    Intake/Output:   Intake/Output Summary (Last 24 hours) at 03/09/2019 1011 Last data filed at 03/09/2019 0700 Gross per 24 hour  Intake 1141.29 ml  Output 3550 ml  Net -2408.71 ml    Physical Exam    General:  NAD  HEENT: Normal Neck: Supple. JVP elevated to jaw. Carotids 2+ bilat; no bruits. No lymphadenopathy or thyromegaly appreciated. Cor: PMI nondisplaced. Regular rate & rhythm. No rubs, gallops or murmurs. Lungs: Diminished basilar sounds.  Abdomen: Soft, nontender, nondistended. No hepatosplenomegaly. No bruits or masses. Good bowel sounds. Extremities: No cyanosis or clubbing. S/p R BKA. Dressing C/D/I. LUE with scattered ecchymosis. 1-2+ edema into thighs of LLE.  Neuro: Alert & orientedx3, cranial nerves grossly intact. moves all 4 extremities w/o difficulty. Affect pleasant  Telemetry   NSR/Sinus tach 100s, personally reviewed.   EKG   No new tracings.    Labs    CBC Recent Labs    03/06/19 1904  03/08/19 0310 03/09/19 0458  WBC 20.5*   < > 19.5* 16.6*  NEUTROABS 18.2*  --   --   --   HGB 6.4*   < > 8.4* 7.8*  HCT 21.6*   < > 26.9* 25.2*  MCV 84.0   < >  82.8 86.3  PLT 364   < > 307 308   < > = values in this interval not displayed.   Basic Metabolic Panel Recent Labs    03/07/19 0616  03/08/19 0310 03/09/19 0458  NA 133*   < > 132* 134*  K 6.0*   < > 5.0 4.0  CL 104   < > 104 104  CO2 17*   < > 21* 22  GLUCOSE 293*   < > 218* 172*  BUN 26*   < > 34* 34*  CREATININE 1.45*   < > 1.73* 1.63*  CALCIUM 8.1*   < > 8.3* 8.4*  MG 1.7  --  1.8  --   PHOS 4.3  --  2.8  --    < > = values in this interval not displayed.   Liver Function Tests No results for input(s): AST, ALT, ALKPHOS, BILITOT, PROT, ALBUMIN in the last 72 hours. No results for input(s): LIPASE, AMYLASE in the last 72 hours. Cardiac Enzymes Recent Labs    03/07/19 0616 03/07/19 1114 03/08/19 0946  TROPONINI 23.21* 29.49* 20.09*    BNP: BNP (last 3 results) Recent Labs    02/09/19 1322  BNP 706.0*    ProBNP (last 3 results) No results for input(s): PROBNP in the last 8760 hours.  D-Dimer No results for input(s): DDIMER in the last 72 hours. Hemoglobin A1C No results for input(s): HGBA1C in the last 72 hours. Fasting Lipid Panel No results for input(s): CHOL, HDL, LDLCALC, TRIG, CHOLHDL, LDLDIRECT in the last 72 hours. Thyroid Function Tests No results for input(s): TSH, T4TOTAL, T3FREE, THYROIDAB in the last 72 hours.  Invalid input(s): FREET3  Other results:   Imaging    Dg Chest Port 1 View  Result Date: 03/08/2019 CLINICAL DATA:  Central line placement EXAM: PORTABLE CHEST 1 VIEW COMPARISON:  03/06/2019 FINDINGS: Right IJ approach central venous catheter tip is at the cavoatrial junction. There is mild cardiomegaly without pulmonary edema. No focal airspace consolidation. No pleural effusion or pneumothorax. IMPRESSION: Right IJ CVC tip at the cavoatrial junction. Electronically Signed   By: Ulyses Jarred M.D.   On: 03/08/2019 14:51      Medications:     Scheduled Medications:  aspirin EC  81 mg Oral Daily   atorvastatin  40 mg  Oral QHS   cholecalciferol  2,000 Units Oral Daily   docusate sodium  100 mg Oral BID   gabapentin  600 mg Oral TID   insulin aspart  0-15 Units Subcutaneous TID WC   insulin aspart  0-5 Units Subcutaneous QHS   insulin aspart  7 Units Subcutaneous TID WC   insulin glargine  60 Units Subcutaneous BID   metoCLOPramide  10 mg Oral QHS   midodrine  5 mg Oral TID WC   pantoprazole  40 mg Oral Daily   traMADol  100 mg Oral TID     Infusions:  sodium chloride Stopped (03/08/19 2223)   ceFEPime (MAXIPIME) IV Stopped (03/08/19 1127)   furosemide (LASIX) infusion     heparin 1,200 Units/hr (03/09/19 0700)   norepinephrine (LEVOPHED) Adult infusion 3 mcg/min (03/09/19 0700)   vancomycin Stopped (03/08/19 2323)     PRN Medications:  acetaminophen, bisacodyl, clonazepam, clonazePAM, diazepam, diphenhydrAMINE, diphenoxylate-atropine, HYDROmorphone (DILAUDID) injection, magnesium citrate, metoCLOPramide **OR** metoCLOPramide (REGLAN) injection, ondansetron **OR** ondansetron (ZOFRAN) IV, oxyCODONE, oxyCODONE, polyethylene glycol, zolpidem    Patient Profile   [name] is a 68 y.o. female with h/o CAD s/p LAD stent, chronic systolic CHF, DM2, neuropathy, gastroparesis, anxiety, panic attacks, and breast cancer s/p right mastectomy.   Suffered an NSTEMI in setting of R BKA. Troponin peaked at 23. Dr. Gwenlyn Found assessed and recommended no intervention at this time with relative lack of symptoms. CHF team consulted for med titration.   Assessment/Plan   1. Elevated troponin, known CAD, recent heart cath 12/2017 - 0.03 --> 2.08 --> 23.21 -> 29.5 -> 20.09 -EKG with RBBB (not new) and nonspecific ST changes that do not appear new when compared to previous tracings - Most recent 01/14/18 with moderate stenosis in the distal LAD (60%) and OM1 (50%) and patent stent in the mid LAD - Continue ASA and plavix.  - No s/s of ischemia.    - in the absence of symptoms and EKG  changes, will defer repeat heart catheterization at this time. - Continue Heparin.  - Will need to restart plavix when OK with ortho - Will plan repeat LHC once shock resolved and diuresed.   2. Septic shock in setting of R ankle infection; Now s/p R BKA.  - Covering with Vanc/Cefepime.  - Home HTN med held.  - Phenylephrine stopped yesterday. Now on NE at 3. Diuresing as below.  - Also newly on midodrine 5 mg TID - If cardiogenic component suspected, could place  central access for CVP/Coox.   3. Acute on chronic systolic heart failure, ICM as above - Echo 03/07/2019 LVEF 20-25%, Severe LAE, Mild RAE.  - Volume status elevated. CVP 18-19 - Start lasix gtt at 15 m/hr. Coox 46.4% this am. Will repeat. If remains low may need milrinone to help diurese.  - Remains on NE at 3.  - No ACE/ARB in setting of AKI.  - No BB for now with acute decompensation.   4. DM - uncontrolled - A1c 7.8% last month. Per primary.   5. AKI - Cr 1.29 -> 1.45 -> 1.82 -> 1.73 -> 1.63. Follow.   6. Right BKA - Per Ortho. Stable.  - Pt is refusing rehab.   Medication concerns reviewed with patient and pharmacy team. Barriers identified: No change.   Length of Stay: 3  Annamaria Helling  03/09/2019, 10:11 AM  Advanced Heart Failure Team Pager [phone] (M-F; 7a - 4p)  Please contact [facility] for night-coverage after hours (4p -7a ) and weekends on [email or web address]  Patient seen with PA, agree with the above note.  She feels good today. Co-ox 56%, CVP 18-20.  She diuresed yesterday but remains volume overloaded.  Still on NE 2 + midodrine 5 mg tid.    On exam, JVP elevated with 2+ edema to knee on left.  S/p right BKA.  Regular S1S2 mildly tachy.   1. CAD: Patient has history of prior anterior MI with LAD stent.  She developed hypotension but no significant ECG changes or chest pain post-op 3/9.  Troponin up to 29, now trending down.  Suspect peri-op NSTEMI.   - Once septic shock is  resolved and creatinine stabilizes, will need left/right heart cath => possibly tomorrow.  - Continue heparin gtt until cath, continue ASA 81, continue statin.  - Restart Plavix at 75 mg daily.   2. Shock: Suspect mixed septic/cardiogenic.  Septic shock with osteomyelitis, WBCs down to 16 and afebrile, no culture data available yet.  - Getting broad spectrum abx (vancomycin/cefepime).  - Support BP as needed with norepinephrine, now at 2.  3. Acute on chronic systolic CHF: Ischemic cardiomyopathy.  Apparently, baseline EF is in the 25-30% range.  Echo this admission with EF 20-25%. She is volume overloaded on exam, CVP 18-19.  Co-ox 56% this morning.   - Titrate off norepinephrine as above as BP allows.  Hopefully can get her off midodrine as well.  - With creatinine coming down, start digoxin 0.125 daily.  - Transitioned onto Lasix gtt at 15 mg/hr today.  4. AKI: Creatinine up to 1.8, now down to 1.6.  Suspect prerenal in setting of septic shock.  5. Anemia: Hgb 7.8 but no overt bleeding.  Follow closely, FOBT.   CRITICAL CARE Performed by: Loralie Champagne  Total critical care time: 35 minutes  Critical care time was exclusive of separately billable procedures and treating other patients.  Critical care was necessary to treat or prevent imminent or life-threatening deterioration.  Critical care was time spent personally by me on the following activities: development of treatment plan with patient and/or surrogate as well as nursing, discussions with consultants, evaluation of patient's response to treatment, examination of patient, obtaining history from patient or surrogate, ordering and performing treatments and interventions, ordering and review of laboratory studies, ordering and review of radiographic studies, pulse oximetry and re-evaluation of patient's condition.  Loralie Champagne 03/09/2019 2:08 PM

## 2019-03-09 NOTE — Progress Notes (Signed)
[facility] for Heparin Indication: chest pain/ACS  Allergies  Allergen Reactions  . Cymbalta [Duloxetine Hcl] Nausea And Vomiting and Other (See Comments)    Vomiting and stiff muscles in legs  . Toradol [Ketorolac Tromethamine] Hives    Patient Measurements: Height: 5\' 9"  (175.3 cm) Weight: 219 lb 12.8 oz (99.7 kg) IBW/kg (Calculated) : 66.2 Heparin Dosing Weight: 87.5 kg  Vital Signs: Temp: 98.7 F (37.1 C) (03/12 0725) Temp Source: Oral (03/12 0725) BP: 118/70 (03/12 0700) Pulse Rate: 107 (03/12 0700)  Labs: Recent Labs    03/07/19 0616 03/07/19 1114 03/07/19 1541 03/07/19 2146 03/07/19 2303 03/08/19 0310 03/08/19 0946 03/09/19 0458 03/09/19 0459  HGB 8.8*  --  6.9* 8.2*  --  8.4*  --  7.8*  --   HCT 29.4*  --  22.6* 26.6*  --  26.9*  --  25.2*  --   PLT 403*  --  251  --   --  307  --  308  --   APTT  --   --   --  119*  --   --   --   --   --   HEPARINUNFRC  --   --   --   --  0.69 0.46  --   --  0.32  CREATININE 1.45*  --  1.82*  --   --  1.73*  --  1.63*  --   TROPONINI 23.21* 29.49*  --   --   --   --  20.09*  --   --     Estimated Creatinine Clearance: 40.9 mL/min (A) (by C-G formula based on SCr of 1.63 mg/dL (H)).   Medical History: Past Medical History:  Diagnosis Date  . Below-knee amputation of right lower extremity ([place]) 03/06/2019  . 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 CHF (congestive heart failure) ([place])   . Complication of anesthesia   . Coronary artery disease   . Diabetes mellitus without complication ([place])    type 2   . Fracture    right ankle  . Fracture of humerus, proximal, left, closed 02/23/2014  . Full dentures   . Myocardial infarct ([place])   . Neuropathic pain   . Panic attacks    Hx: of  . PONV (postoperative nausea and vomiting)   . Wears glasses     Medications:  Scheduled:  . aspirin EC  81 mg Oral Daily  . atorvastatin  40 mg Oral QHS  . cholecalciferol  2,000 Units Oral Daily  .  docusate sodium  100 mg Oral BID  . gabapentin  600 mg Oral TID  . insulin aspart  0-15 Units Subcutaneous TID WC  . insulin aspart  0-5 Units Subcutaneous QHS  . insulin aspart  7 Units Subcutaneous TID WC  . insulin glargine  60 Units Subcutaneous BID  . metoCLOPramide  10 mg Oral QHS  . midodrine  5 mg Oral TID WC  . pantoprazole  40 mg Oral Daily  . traMADol  100 mg Oral TID    Assessment: 25 YOF who presented for R BKA. PMH includes prior MI w/ stent placed in LAD in 2008, on aspirin and Plavix PTA with a patent stent per heart cath in Jan 2019. This admission, troponins were drawn as part of sepsis workup and have trended 0.03>2.08>23.21>29.49. EKG shows RBBB (not new) and nonspecific ST changes that did not appear new per cardiology. She denied any anginal symptoms.  Pharmacy has been consulted for heparin dosing.  Heparin level this morning remains therapeutic at 0.32. Hgb remains low at 7.8, plts wnl. No bleeding or infusion issues noted. Planning for left heart cath once shock resolves and patient has been adequately diuresed.  Goal of Therapy:  Heparin level 0.3-0.7 units/mL Monitor platelets by anticoagulation protocol: Yes   Plan:  Cont heparin at 1200 units/hr until cath per HF team Monitor daily heparin level, CBC, s/sx of bleeding   Jackson Latino, PharmD PGY1 Pharmacy Resident Phone [phone] 03/09/2019     11:13 AM

## 2019-03-09 NOTE — Consult Note (Signed)
Physical Medicine and Rehabilitation Consult Reason for Consult: Decreased functional mobility Referring Physician: Dr Mardelle Matte  Chief complaint. Pain .Stump pain  HPI: [name] is a 68 y.o.right-handed female  With history of diabetes mellitus, diastolic congestive heart failure, CAD with stenting 2008 maintained on Plavix followed by cardiology services Dr.Khan, panic attack. Per chart review patient lives with daughter and assist as needed. One level home to steps to entry. Independent prior to admission. Presented 03/06/2019 after a recent fall 02/15/2019 sustaining a right ankle fracture followed by orthopedic services. She was initially placed in a splint as an outpatient after undergoing reduction in the emergency room and referred to orthopedic services.She had another fall after that first occurrence and did not seek medical help. Noted bleeding through splint after most recent fall and again did not pursue medical evaluation. Upon evaluation noted extremely foul odor and the tibia was exposed. 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 Mardelle Matte. Postoperative hypotension and required intubation maintained on pressors. Findings of elevated troponin with cardiology services consulted. Echocardiogram with ejection fraction 25% severely reduced systolic function. Patient remains on aspirin and Plavix therapy as well as currently on intravenous heparin. Follow-up critical care medicine for septic shock and setting of right ankle infection. Currently remains on midodrine for orthostasis. Acute blood loss anemia 7.8. Therapy evaluations completed with recommendations of physical medicine rehabilitation consult.   Review of Systems  Constitutional: Negative for chills and fever.  HENT: Negative for hearing loss.   Eyes: Negative for blurred vision and double vision.  Respiratory: Positive for shortness of breath. Negative for cough.    Cardiovascular: Positive for palpitations and leg swelling. Negative for chest pain.  Gastrointestinal: Positive for constipation. Negative for heartburn, nausea and vomiting.  Genitourinary: Negative for flank pain and hematuria.  Musculoskeletal: Positive for falls, joint pain and myalgias.  Skin: Negative for rash.  Neurological: Negative for seizures.  Psychiatric/Behavioral: The patient has insomnia.        Panic attacks  All other systems reviewed and are negative.  Past Medical History:  Diagnosis Date  . Below-knee amputation of right lower extremity ([place]) 03/06/2019  . 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 CHF (congestive heart failure) ([place])   . Complication of anesthesia   . Coronary artery disease   . Diabetes mellitus without complication ([place])    type 2   . Fracture    right ankle  . Fracture of humerus, proximal, left, closed 02/23/2014  . Full dentures   . Myocardial infarct ([place])   . Neuropathic pain   . Panic attacks    Hx: of  . PONV (postoperative nausea and vomiting)   . Wears glasses    Past Surgical History:  Procedure Laterality Date  . APPENDECTOMY    . BREAST BIOPSY Left 02/12/15   benign, clip placed  . BREAST SURGERY Right 2012   Right mastectomy with SLN, DCIS, grade 1-2. ER/PR not performed.   Marland Kitchen CARDIAC CATHETERIZATION    . cardiac stents  2007  . CHOLECYSTECTOMY    . COLON SURGERY     Subtotal colectomy for colonic inertia.  . COLONOSCOPY    . DILATION AND CURETTAGE OF UTERUS    . EYE SURGERY    . LEFT HEART CATH AND CORONARY ANGIOGRAPHY Left 01/14/2018   Procedure: LEFT HEART CATH AND CORONARY ANGIOGRAPHY;  Surgeon: Dionisio David, MD;  Location: [place] CV LAB;  Service:  Cardiovascular;  Laterality: Left;  Marland Kitchen MASTECTOMY Right 2012   Hx: of right breast, DCIS   . MULTIPLE TOOTH EXTRACTIONS    . ORIF ANKLE FRACTURE Right 03/06/2019   Procedure: RIGHT BELOW KNEE AMPUTATION;  Surgeon: Marchia Bond, MD;  Location: [place];  Service: Orthopedics;   Laterality: Right;  . ORIF HUMERUS FRACTURE Left 02/23/2014   Procedure: OPEN REDUCTION INTERNAL FIXATION (ORIF) PROXIMAL HUMERUS FRACTURE;  Surgeon: Johnny Bridge, MD;  Location: [place];  Service: Orthopedics;  Laterality: Left;  . TUBAL LIGATION     Family History  Problem Relation Age of Onset  . Diabetes Mother   . Hypertension Mother   . Stroke Mother   . Diabetes Brother   . Breast cancer Neg Hx    Social History:  reports that she has never smoked. She has never used smokeless tobacco. She reports that she does not drink alcohol or use drugs. Allergies:  Allergies  Allergen Reactions  . Cymbalta [Duloxetine Hcl] Nausea And Vomiting and Other (See Comments)    Vomiting and stiff muscles in legs  . Toradol [Ketorolac Tromethamine] Hives   Medications Prior to Admission  Medication Sig Dispense Refill  . acetaminophen (TYLENOL) 500 MG tablet Take 1,000 mg by mouth every 6 (six) hours as needed for moderate pain or headache.    Marland Kitchen atorvastatin (LIPITOR) 40 MG tablet Take 40 mg by mouth at bedtime.    . Cholecalciferol (VITAMIN D) 2000 units CAPS Take 2,000 Units by mouth daily.     . clonazePAM (KLONOPIN) 0.5 MG tablet Take 0.5 mg by mouth daily as needed for anxiety.     . clopidogrel (PLAVIX) 75 MG tablet Take 75 mg by mouth daily with breakfast.    . cyclobenzaprine (FLEXERIL) 10 MG tablet Take 1 tablet (10 mg total) by mouth 2 (two) times daily. (Patient taking differently: Take 10 mg by mouth at bedtime. ) 60 tablet 5  . diazepam (VALIUM) 5 MG tablet Take 1 tablet (5 mg total) by mouth every 8 (eight) hours as needed for muscle spasms. 20 tablet 0  . diphenoxylate-atropine (LOMOTIL) 2.5-0.025 MG tablet Take 1 tablet by mouth 4 (four) times daily as needed for diarrhea or loose stools.     . furosemide (LASIX) 40 MG tablet Take 40 mg by mouth daily.    Marland Kitchen gabapentin (NEURONTIN) 600 MG tablet Take 1 tablet (600 mg total) by mouth 3 (three) times daily. 90 tablet 5  . insulin  aspart (NOVOLOG) 100 UNIT/ML injection Inject 20-60 Units into the skin 3 (three) times daily before meals. Per sliding scale    . insulin glargine (LANTUS) 100 UNIT/ML injection Inject 60 Units into the skin 2 (two) times daily.    . metoCLOPramide (REGLAN) 10 MG tablet Take 10 mg by mouth at bedtime.     . metolazone (ZAROXOLYN) 2.5 MG tablet Take 2.5 mg by mouth daily.    Marland Kitchen oxyCODONE-acetaminophen (PERCOCET) 7.5-325 MG tablet Take 1 tablet by mouth every 4 (four) hours as needed for severe pain. 20 tablet 0  . pantoprazole (PROTONIX) 40 MG tablet Take 40 mg by mouth daily.     . sacubitril-valsartan (ENTRESTO) 24-26 MG Take 1 tablet by mouth 2 (two) times daily.     . traMADol (ULTRAM) 50 MG tablet Take 2 tablets (100 mg total) by mouth every 8 (eight) hours. 540 tablet 0  . dicyclomine (BENTYL) 10 MG capsule Take 1 capsule (10 mg total) by mouth 3 (three) times daily with meals  as needed for spasms. (Patient not taking: Reported on 02/21/2019) 15 capsule 0  . ondansetron (ZOFRAN) 4 MG tablet Take 4 mg by mouth every 8 (eight) hours as needed for nausea or vomiting.       Home: Home Living Family/patient expects to be discharged to:: Private residence Living Arrangements: Children Available Help at Discharge: Available 24 hours/day, Family Type of Home: House Home Access: Stairs to enter Technical brewer of Steps: 2 Entrance Stairs-Rails: None Home Layout: One level Bathroom Shower/Tub: Tub/shower unit, Architectural technologist: Standard Bathroom Accessibility: No Home Equipment: Toilet riser, Crutches, Environmental consultant - standard  Functional History: Prior Function Level of Independence: Independent Comments: drives;  Functional Status:  Mobility: Bed Mobility Overal bed mobility: Needs Assistance Bed Mobility: Rolling, Sidelying to Sit, Sit to Supine Rolling: Min assist Sidelying to sit: Mod assist Sit to supine: Mod assist, +2 for physical assistance General bed mobility  comments: anxious about bed mobility Transfers Overall transfer level: Needs assistance Transfers: Sit to/from Stand Sit to Stand: Mod assist, +2 physical assistance General transfer comment: Able to stand and scoot bottom toward Edge of bed wtih +2 mod A. Difficulty with proprioceptive input of L foot due to neuropathy      ADL: ADL Overall ADL's : Needs assistance/impaired Grooming: Moderate assistance, Sitting Upper Body Bathing: Moderate assistance, Sitting Lower Body Bathing: Maximal assistance, Sitting/lateral leans Upper Body Dressing : Moderate assistance, Sitting Lower Body Dressing: Maximal assistance, Sitting/lateral leans Toilet Transfer: +2 for physical assistance, Moderate assistance Toileting- Clothing Manipulation and Hygiene: Total assistance Functional mobility during ADLs: Moderate assistance, +2 for physical assistance(sit - stand only)  Cognition: Cognition Overall Cognitive Status: Within Functional Limits for tasks assessed Orientation Level: Oriented X4 Cognition Arousal/Alertness: Awake/alert Behavior During Therapy: WFL for tasks assessed/performed Overall Cognitive Status: Within Functional Limits for tasks assessed  Blood pressure 118/70, pulse (!) 107, temperature 98 F (36.7 C), temperature source Oral, resp. rate (!) 23, height _0  (1.753 m), weight 99.7 kg, SpO2 96 %. Physical Exam  Vitals reviewed. Constitutional: She is oriented to person, place, and time. She appears well-developed. No distress.  HENT:  Head: Normocephalic and atraumatic.  Eyes: Pupils are equal, round, and reactive to light. EOM are normal.  Neck: Normal range of motion.  Cardiovascular: Normal rate and regular rhythm.  Respiratory: Effort normal. No respiratory distress.  GI: Soft. She exhibits no distension.  Musculoskeletal:        General: Edema present.     Comments: Right BK stump wrapped in ACE.   Neurological: She is alert and oriented to person, place, and  time. No cranial nerve deficit.  Normal insight, memory, and awareness. Motor: 4-5/5 bilateral UE. RLE: can lift 3" off bed. LLE 4/5. Sensory diminished to LT/PP below lower calf LLE  Skin:  Right BK not visualized. Chronic changes and wounds arms/legs  Psychiatric: She has a normal mood and affect. Her behavior is normal. Judgment and thought content normal.    Results for orders placed or performed during the hospital encounter of 03/06/19 (from the past 24 hour(s))  Glucose, capillary     Status: Abnormal   Collection Time: 03/08/19  3:03 PM  Result Value Ref Range   Glucose-Capillary 314 (H) 70 - 99 mg/dL  .Cooxemetry Panel (carboxy, met, total hgb, O2 sat)     Status: Abnormal   Collection Time: 03/08/19  3:25 PM  Result Value Ref Range   Total hemoglobin 7.6 (L) 12.0 - 16.0 g/dL   O2 Saturation 54.3 %  Carboxyhemoglobin 1.4 0.5 - 1.5 %   Methemoglobin 1.5 0.0 - 1.5 %  Glucose, capillary     Status: Abnormal   Collection Time: 03/08/19  6:23 PM  Result Value Ref Range   Glucose-Capillary 304 (H) 70 - 99 mg/dL  Glucose, capillary     Status: Abnormal   Collection Time: 03/08/19 10:06 PM  Result Value Ref Range   Glucose-Capillary 274 (H) 70 - 99 mg/dL  Basic metabolic panel     Status: Abnormal   Collection Time: 03/09/19  4:58 AM  Result Value Ref Range   Sodium 134 (L) 135 - 145 mmol/L   Potassium 4.0 3.5 - 5.1 mmol/L   Chloride 104 98 - 111 mmol/L   CO2 22 22 - 32 mmol/L   Glucose, Bld 172 (H) 70 - 99 mg/dL   BUN 34 (H) 8 - 23 mg/dL   Creatinine, Ser 1.63 (H) 0.44 - 1.00 mg/dL   Calcium 8.4 (L) 8.9 - 10.3 mg/dL   GFR calc non Af Amer 32 (L) >60 mL/min   GFR calc Af Amer 37 (L) >60 mL/min   Anion gap 8 5 - 15  CBC     Status: Abnormal   Collection Time: 03/09/19  4:58 AM  Result Value Ref Range   WBC 16.6 (H) 4.0 - 10.5 K/uL   RBC 2.92 (L) 3.87 - 5.11 MIL/uL   Hemoglobin 7.8 (L) 12.0 - 15.0 g/dL   HCT 25.2 (L) 36.0 - 46.0 %   MCV 86.3 80.0 - 100.0 fL   MCH 26.7  26.0 - 34.0 pg   MCHC 31.0 30.0 - 36.0 g/dL   RDW 16.9 (H) 11.5 - 15.5 %   Platelets 308 150 - 400 K/uL   nRBC 0.2 0.0 - 0.2 %  Heparin level (unfractionated)     Status: None   Collection Time: 03/09/19  4:59 AM  Result Value Ref Range   Heparin Unfractionated 0.32 0.30 - 0.70 IU/mL  Cooxemetry Panel (carboxy, met, total hgb, O2 sat)     Status: Abnormal   Collection Time: 03/09/19  5:05 AM  Result Value Ref Range   Total hemoglobin 8.3 (L) 12.0 - 16.0 g/dL   O2 Saturation 46.4 %   Carboxyhemoglobin 1.4 0.5 - 1.5 %   Methemoglobin 1.0 0.0 - 1.5 %  Glucose, capillary     Status: Abnormal   Collection Time: 03/09/19  7:38 AM  Result Value Ref Range   Glucose-Capillary 157 (H) 70 - 99 mg/dL   Dg Chest Port 1 View  Result Date: 03/08/2019 CLINICAL DATA:  Central line placement EXAM: PORTABLE CHEST 1 VIEW COMPARISON:  03/06/2019 FINDINGS: Right IJ approach central venous catheter tip is at the cavoatrial junction. There is mild cardiomegaly without pulmonary edema. No focal airspace consolidation. No pleural effusion or pneumothorax. IMPRESSION: Right IJ CVC tip at the cavoatrial junction. Electronically Signed   By: Ulyses Jarred M.D.   On: 03/08/2019 14:51   [place] Ekg Site Rite  Result Date: 03/08/2019 If Site Rite image not attached, placement could not be confirmed due to current cardiac rhythm.    Assessment/Plan: Diagnosis: pleasant 68 yo female with severe diabetes and polyneuropathy who suffered fractured right ankle. Ultimately right BKA was required to save limb.  1. Does the need for close, 24 hr/day medical supervision in concert with the patient's rehab needs make it unreasonable for this patient to be served in a less intensive setting? Yes 2. Co-Morbidities requiring supervision/potential complications: CAD/dCHF, DM with DPN,  3. Due to bladder management, bowel management, safety, skin/wound care, disease management, medication administration, pain management and patient  education, does the patient require 24 hr/day rehab nursing? Yes 4. Does the patient require coordinated care of a physician, rehab nurse, PT (1-2 hrs/day, 5 days/week) and OT (1-2 hrs/day, 5 days/week) to address physical and functional deficits in the context of the above medical diagnosis(es)? Yes Addressing deficits in the following areas: balance, endurance, locomotion, strength, transferring, bowel/bladder control, bathing, dressing, feeding, grooming, toileting and psychosocial support 5. Can the patient actively participate in an intensive therapy program of at least 3 hrs of therapy per day at least 5 days per week? Yes 6. The potential for patient to make measurable gains while on inpatient rehab is excellent 7. Anticipated functional outcomes upon discharge from inpatient rehab are modified independent  with PT, modified independent and supervision with OT, n/a with SLP.--w/c level goals 8. Estimated rehab length of stay to reach the above functional goals is: 10-14 days 9. Anticipated D/C setting: Home 10. Anticipated post D/C treatments: [place] therapy 11. Overall Rehab/Functional Prognosis: excellent  RECOMMENDATIONS: This patient's condition is appropriate for continued rehabilitative care in the following setting: CIR Patient has agreed to participate in recommended program. Yes Note that insurance prior authorization may be required for reimbursement for recommended care.  Comment: Rehab Admissions Coordinator to follow up.  Thanks,  Meredith Staggers, MD, Mellody Drown  I have personally performed a face to face diagnostic evaluation of this patient. Additionally, I have examined pertinent labs and radiographic images. I have reviewed and concur with the physician assistant's documentation above.    Lavon Paganini Angiulli, PA-C 03/09/2019

## 2019-03-09 NOTE — Progress Notes (Addendum)
Patient ID: [name], female   DOB: Dec 11, 1951, 68 y.o.   MRN: [id number]     Subjective:  Patient reports pain as mild to moderate.  Patient in bed and sleeping   Objective:   VITALS:   Vitals:   03/09/19 0400 03/09/19 0500 03/09/19 0600 03/09/19 0700  BP: (!) 102/50 (!) 118/92 114/63 118/70  Pulse: (!) 108 (!) 120 (!) 106 (!) 107  Resp: 15 (!) 22 (!) 21 (!) 23  Temp: 99.3 F (37.4 C)     TempSrc: Oral     SpO2: 97% 94% 96% 96%  Weight:  99.7 kg    Height:        ABD soft Sensation intact distally Dorsiflexion/Plantar flexion intact Incision: dressing C/D/I and no drainage Splint in place  Lab Results  Component Value Date   WBC 16.6 (H) 03/09/2019   HGB 7.8 (L) 03/09/2019   HCT 25.2 (L) 03/09/2019   MCV 86.3 03/09/2019   PLT 308 03/09/2019   BMET    Component Value Date/Time   NA 134 (L) 03/09/2019 0458   NA 139 01/03/2013 1311   NA 138 03/12/2009 1059   K 4.0 03/09/2019 0458   K 3.7 01/03/2013 1311   K 4.6 03/12/2009 1059   CL 104 03/09/2019 0458   CL 106 01/03/2013 1311   CL 96 (L) 03/12/2009 1059   CO2 22 03/09/2019 0458   CO2 22 01/03/2013 1311   CO2 28 03/12/2009 1059   GLUCOSE 172 (H) 03/09/2019 0458   GLUCOSE 92 01/03/2013 1311   GLUCOSE 280 (H) 03/12/2009 1059   BUN 34 (H) 03/09/2019 0458   BUN 13 01/03/2013 1311   BUN 13 03/12/2009 1059   CREATININE 1.63 (H) 03/09/2019 0458   CREATININE 1.17 01/03/2013 1311   CREATININE 1.3 (H) 03/12/2009 1059   CALCIUM 8.4 (L) 03/09/2019 0458   CALCIUM 9.0 01/03/2013 1311   CALCIUM 9.5 03/12/2009 1059   GFRNONAA 32 (L) 03/09/2019 0458   GFRNONAA 50 (L) 01/03/2013 1311   GFRAA 37 (L) 03/09/2019 0458   GFRAA 58 (L) 01/03/2013 1311     Assessment/Plan: 3 Days Post-Op   Principal Problem:   Below-knee amputation of right lower extremity (HCC) Active Problems:   S/P below knee amputation, right (HCC)   Hypoxemia   Shock circulatory (HCC)   Acute pain   Acute encephalopathy  Non-STEMI (non-ST elevated myocardial infarction) ([place])   Encounter for central line placement   Advance diet Continue plan per Critical care Continue splint   Lunette Stands 03/09/2019, 7:46 AM  Seen and agree with above.  Inpatient rehab consult placed.  She is interactive with me normally right now.  Answering questions appropriately.  Doing ok as far as pain goes.  5:42 PM   Marchia Bond, MD Cell [phone]

## 2019-03-10 ENCOUNTER — Encounter (HOSPITAL_COMMUNITY)
Admission: RE | Disposition: A | Discharge status: Home Health | Payer: Self-pay | Source: Home / Self Care | Attending: Orthopedic Surgery

## 2019-03-10 ENCOUNTER — Encounter: Payer: Medicare Other | Admitting: Anesthesiology

## 2019-03-10 DIAGNOSIS — I214 Non-ST elevation (NSTEMI) myocardial infarction: Secondary | ICD-10-CM

## 2019-03-10 DIAGNOSIS — I2510 Atherosclerotic heart disease of native coronary artery without angina pectoris: Secondary | ICD-10-CM

## 2019-03-10 HISTORY — PX: LEFT HEART CATH AND CORONARY ANGIOGRAPHY: CATH118249

## 2019-03-10 LAB — BASIC METABOLIC PANEL
Anion gap: 11 (ref 5–15)
BUN: 33 mg/dL — ABNORMAL HIGH (ref 8–23)
CO2: 27 mmol/L (ref 22–32)
Calcium: 8.8 mg/dL — ABNORMAL LOW (ref 8.9–10.3)
Chloride: 97 mmol/L — ABNORMAL LOW (ref 98–111)
Creatinine, Ser: 1.42 mg/dL — ABNORMAL HIGH (ref 0.44–1.00)
GFR calc Af Amer: 44 mL/min — ABNORMAL LOW (ref >60)
GFR calc non Af Amer: 38 mL/min — ABNORMAL LOW (ref >60)
Glucose, Bld: 135 mg/dL — ABNORMAL HIGH (ref 70–99)
Potassium: 3.3 mmol/L — ABNORMAL LOW (ref 3.5–5.1)
Sodium: 135 mmol/L (ref 135–145)

## 2019-03-10 LAB — POCT I-STAT EG7
ACID-BASE EXCESS: 2 mmol/L (ref 0.0–2.0)
Bicarbonate: 27.4 mmol/L (ref 20.0–28.0)
Calcium, Ion: 1.14 mmol/L — ABNORMAL LOW (ref 1.15–1.40)
HCT: 25 % — ABNORMAL LOW (ref 36.0–46.0)
Hemoglobin: 8.5 g/dL — ABNORMAL LOW (ref 12.0–15.0)
O2 Saturation: 61 %
Potassium: 4.6 mmol/L (ref 3.5–5.1)
Sodium: 134 mmol/L — ABNORMAL LOW (ref 135–145)
TCO2: 29 mmol/L (ref 22–32)
pCO2, Ven: 44.2 mmHg (ref 44.0–60.0)
pH, Ven: 7.4 (ref 7.250–7.430)
pO2, Ven: 32 mmHg (ref 32.0–45.0)

## 2019-03-10 LAB — OCCULT BLOOD X 1 CARD TO LAB, STOOL: FECAL OCCULT BLD: POSITIVE — AB

## 2019-03-10 LAB — CBC
HCT: 27.4 % — ABNORMAL LOW (ref 36.0–46.0)
Hemoglobin: 8.6 g/dL — ABNORMAL LOW (ref 12.0–15.0)
MCH: 26.8 pg (ref 26.0–34.0)
MCHC: 31.4 g/dL (ref 30.0–36.0)
MCV: 85.4 fL (ref 80.0–100.0)
PLATELETS: 363 10*3/uL (ref 150–400)
RBC: 3.21 MIL/uL — ABNORMAL LOW (ref 3.87–5.11)
RDW: 16.8 % — ABNORMAL HIGH (ref 11.5–15.5)
WBC: 16.6 10*3/uL — ABNORMAL HIGH (ref 4.0–10.5)
nRBC: 0.5 % — ABNORMAL HIGH (ref 0.0–0.2)

## 2019-03-10 LAB — MAGNESIUM: Magnesium: 1.6 mg/dL — ABNORMAL LOW (ref 1.7–2.4)

## 2019-03-10 LAB — GLUCOSE, CAPILLARY
GLUCOSE-CAPILLARY: 135 mg/dL — AB (ref 70–99)
Glucose-Capillary: 125 mg/dL — ABNORMAL HIGH (ref 70–99)
Glucose-Capillary: 89 mg/dL (ref 70–99)
Glucose-Capillary: 216 mg/dL — ABNORMAL HIGH (ref 70–99)

## 2019-03-10 LAB — HEPARIN LEVEL (UNFRACTIONATED): Heparin Unfractionated: 0.33 IU/mL (ref 0.30–0.70)

## 2019-03-10 LAB — COOXEMETRY PANEL
Carboxyhemoglobin: 1.9 % — ABNORMAL HIGH (ref 0.5–1.5)
Methemoglobin: 1.1 % (ref 0.0–1.5)
O2 Saturation: 67.6 %
Total hemoglobin: 9.3 g/dL — ABNORMAL LOW (ref 12.0–16.0)

## 2019-03-10 LAB — PHOSPHORUS: Phosphorus: 2.1 mg/dL — ABNORMAL LOW (ref 2.5–4.6)

## 2019-03-10 SURGERY — LEFT HEART CATH AND CORONARY ANGIOGRAPHY
Anesthesia: LOCAL

## 2019-03-10 MED ORDER — HEPARIN SODIUM (PORCINE) 1000 UNIT/ML IJ SOLN
INTRAMUSCULAR | Status: DC | PRN
  Administered 2019-03-10: 5000 [IU] via INTRAVENOUS

## 2019-03-10 MED ORDER — FUROSEMIDE 10 MG/ML IJ SOLN
15.0000 mg/h | INTRAVENOUS | Status: DC
  Administered 2019-03-10: 15 mg/h via INTRAVENOUS
  Filled 2019-03-10: qty 25

## 2019-03-10 MED ORDER — HEPARIN SODIUM (PORCINE) 5000 UNIT/ML IJ SOLN
5000.0000 [IU] | Freq: Three times a day (TID) | INTRAMUSCULAR | Status: DC
  Administered 2019-03-11 – 2019-03-13 (×8): 5000 [IU] via SUBCUTANEOUS
  Filled 2019-03-10 (×8): qty 1

## 2019-03-10 MED ORDER — HEPARIN SODIUM (PORCINE) 1000 UNIT/ML IJ SOLN
INTRAMUSCULAR | Status: AC
  Filled 2019-03-10: qty 1

## 2019-03-10 MED ORDER — SODIUM CHLORIDE 0.9 % IV SOLN
INTRAVENOUS | Status: AC | PRN
  Administered 2019-03-10: 10 mL/h via INTRAVENOUS

## 2019-03-10 MED ORDER — HEPARIN SODIUM (PORCINE) 5000 UNIT/ML IJ SOLN: 5000.0000 [IU] | Freq: Three times a day (TID) | INTRAMUSCULAR | Status: DC

## 2019-03-10 MED ORDER — SODIUM CHLORIDE 0.9% FLUSH
3.0000 mL | Freq: Two times a day (BID) | INTRAVENOUS | Status: DC
  Administered 2019-03-10 – 2019-03-12 (×4): 3 mL via INTRAVENOUS

## 2019-03-10 MED ORDER — POTASSIUM PHOSPHATES 15 MMOLE/5ML IV SOLN
30.0000 mmol | Freq: Once | INTRAVENOUS | Status: AC
  Administered 2019-03-10: 30 mmol via INTRAVENOUS
  Filled 2019-03-10: qty 10

## 2019-03-10 MED ORDER — ACETAMINOPHEN 325 MG PO TABS: 650.0000 mg | ORAL_TABLET | ORAL | Status: DC | PRN

## 2019-03-10 MED ORDER — LIDOCAINE HCL (PF) 1 % IJ SOLN
INTRAMUSCULAR | Status: AC
  Filled 2019-03-10: qty 30

## 2019-03-10 MED ORDER — MAGNESIUM SULFATE 2 GM/50ML IV SOLN
2.0000 g | Freq: Once | INTRAVENOUS | Status: AC
  Administered 2019-03-10: 2 g via INTRAVENOUS
  Filled 2019-03-10: qty 50

## 2019-03-10 MED ORDER — HEPARIN (PORCINE) IN NACL 1000-0.9 UT/500ML-% IV SOLN
INTRAVENOUS | Status: AC
  Filled 2019-03-10: qty 1000

## 2019-03-10 MED ORDER — SODIUM CHLORIDE 0.9 % IV SOLN
250.0000 mL | INTRAVENOUS | Status: DC | PRN
  Administered 2019-03-10: 250 mL via INTRAVENOUS

## 2019-03-10 MED ORDER — SODIUM CHLORIDE 0.9% FLUSH: 3.0000 mL | INTRAVENOUS | Status: DC | PRN

## 2019-03-10 MED ORDER — ONDANSETRON HCL 4 MG/2ML IJ SOLN: 4.0000 mg | Freq: Four times a day (QID) | INTRAMUSCULAR | Status: DC | PRN

## 2019-03-10 MED ORDER — FENTANYL CITRATE (PF) 100 MCG/2ML IJ SOLN
INTRAMUSCULAR | Status: DC | PRN
  Administered 2019-03-10 (×2): 25 ug via INTRAVENOUS

## 2019-03-10 MED ORDER — VERAPAMIL HCL 2.5 MG/ML IV SOLN
INTRAVENOUS | Status: DC | PRN
  Administered 2019-03-10: 10 mL via INTRA_ARTERIAL

## 2019-03-10 MED ORDER — IOHEXOL 350 MG/ML SOLN
INTRAVENOUS | Status: DC | PRN
  Administered 2019-03-10: 80 mL via INTRACARDIAC

## 2019-03-10 MED ORDER — HEPARIN (PORCINE) IN NACL 1000-0.9 UT/500ML-% IV SOLN
INTRAVENOUS | Status: DC | PRN
  Administered 2019-03-10 (×2): 500 mL

## 2019-03-10 MED ORDER — LIDOCAINE HCL (PF) 1 % IJ SOLN
INTRAMUSCULAR | Status: DC | PRN
  Administered 2019-03-10 (×2): 2 mL

## 2019-03-10 MED ORDER — MIDAZOLAM HCL 2 MG/2ML IJ SOLN
INTRAMUSCULAR | Status: AC
  Filled 2019-03-10: qty 2

## 2019-03-10 MED ORDER — MIDAZOLAM HCL 2 MG/2ML IJ SOLN
INTRAMUSCULAR | Status: DC | PRN
  Administered 2019-03-10: 0.5 mg via INTRAVENOUS
  Administered 2019-03-10: 1 mg via INTRAVENOUS

## 2019-03-10 MED ORDER — FENTANYL CITRATE (PF) 100 MCG/2ML IJ SOLN
INTRAMUSCULAR | Status: AC
  Filled 2019-03-10: qty 2

## 2019-03-10 MED ORDER — POTASSIUM CHLORIDE 10 MEQ/50ML IV SOLN
10.0000 meq | INTRAVENOUS | Status: AC
  Administered 2019-03-10 (×6): 10 meq via INTRAVENOUS
  Filled 2019-03-10 (×6): qty 50

## 2019-03-10 SURGICAL SUPPLY — 16 items
CATH 5FR JL3.5 JR4 ANG PIG MP (CATHETERS) ×2 IMPLANT
CATH LAUNCHER 5F JL3 (CATHETERS) ×1 IMPLANT
CATHETER LAUNCHER 5F JL3 (CATHETERS) ×2
DEVICE RAD COMP TR BAND LRG (VASCULAR PRODUCTS) ×2 IMPLANT
ELECT DEFIB PAD ADLT CADENCE (PAD) ×2 IMPLANT
GLIDESHEATH SLEND SS 6F .021 (SHEATH) ×2 IMPLANT
GUIDEWIRE INQWIRE 1.5J.035X260 (WIRE) ×1 IMPLANT
HOVERMATT SINGLE USE (MISCELLANEOUS) ×2 IMPLANT
INQWIRE 1.5J .035X260CM (WIRE) ×2
KIT HEART LEFT (KITS) ×2 IMPLANT
PACK CARDIAC CATHETERIZATION (CUSTOM PROCEDURE TRAY) ×2 IMPLANT
SHEATH GLIDE SLENDER 4/5FR (SHEATH) IMPLANT
SHEATH PROBE COVER 6X72 (BAG) ×2 IMPLANT
TRANSDUCER W/STOPCOCK (MISCELLANEOUS) ×2 IMPLANT
TUBING CIL FLEX 10 FLL-RA (TUBING) ×2 IMPLANT
WIRE HI TORQ VERSACORE-J 145CM (WIRE) ×2 IMPLANT

## 2019-03-10 NOTE — Interval H&P Note (Signed)
History and Physical Interval Note:  03/10/2019 2:54 PM  [name]  has presented today for surgery, with the diagnosis of Heart failure.  The various methods of treatment have been discussed with the patient and family. After consideration of risks, benefits and other options for treatment, the patient has consented to  Procedure(s): RIGHT/LEFT HEART CATH AND CORONARY ANGIOGRAPHY (N/A) as a surgical intervention.  The patient's history has been reviewed, patient examined, no change in status, stable for surgery.  I have reviewed the patient's chart and labs.  Questions were answered to the patient's satisfaction.     Arriel Victor [employer]

## 2019-03-10 NOTE — Progress Notes (Signed)
TR BAND REMOVAL  LOCATION:    Radial rt radial/wrist  DEFLATED PER PROTOCOL:   yes  TIME BAND OFF / DRESSING APPLIED:    1730/gauze and tegaderm  SITE UPON ARRIVAL:    Level 0  SITE AFTER BAND REMOVAL:    Level 0  CIRCULATION SENSATION AND MOVEMENT:    Within Normal Limits : rt hand and fingers warm and pink, sensation present, rt arm elevated on pillow. Instructions reviewed w/patient  COMMENTS:

## 2019-03-10 NOTE — Progress Notes (Signed)
[facility] for Heparin Indication: chest pain/ACS  Allergies  Allergen Reactions  . Cymbalta [Duloxetine Hcl] Nausea And Vomiting and Other (See Comments)    Vomiting and stiff muscles in legs  . Toradol [Ketorolac Tromethamine] Hives    Patient Measurements: Height: 5\' 9"  (175.3 cm) Weight: 219 lb 12.8 oz (99.7 kg) IBW/kg (Calculated) : 66.2 Heparin Dosing Weight: 87.5 kg  Vital Signs: Temp: 98.2 F (36.8 C) (03/13 0700) Temp Source: Oral (03/13 0700) BP: 106/55 (03/13 0630) Pulse Rate: 93 (03/13 0630)  Labs: Recent Labs    03/07/19 1114  03/07/19 2146  03/08/19 0310 03/08/19 0946 03/09/19 0458 03/09/19 0459 03/10/19 0405  HGB  --    < > 8.2*  --  8.4*  --  7.8*  --  8.6*  HCT  --    < > 26.6*  --  26.9*  --  25.2*  --  27.4*  PLT  --    < >  --   --  307  --  308  --  363  APTT  --   --  119*  --   --   --   --   --   --   HEPARINUNFRC  --   --   --    < > 0.46  --   --  0.32 0.33  CREATININE  --    < >  --   --  1.73*  --  1.63*  --  1.42*  TROPONINI 29.49*  --   --   --   --  20.09*  --   --   --    < > = values in this interval not displayed.    Estimated Creatinine Clearance: 47 mL/min (A) (by C-G formula based on SCr of 1.42 mg/dL (H)).   Medical History: Past Medical History:  Diagnosis Date  . Below-knee amputation of right lower extremity ([place]) 03/06/2019  . 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 CHF (congestive heart failure) ([place])   . Complication of anesthesia   . Coronary artery disease   . Diabetes mellitus without complication ([place])    type 2   . Fracture    right ankle  . Fracture of humerus, proximal, left, closed 02/23/2014  . Full dentures   . Myocardial infarct ([place])   . Neuropathic pain   . Panic attacks    Hx: of  . PONV (postoperative nausea and vomiting)   . Wears glasses     Medications:  Scheduled:  . aspirin EC  81 mg Oral Daily  . atorvastatin  40 mg Oral QHS  . cholecalciferol  2,000 Units  Oral Daily  . clopidogrel  75 mg Oral Daily  . digoxin  0.125 mg Oral Daily  . docusate sodium  100 mg Oral BID  . gabapentin  600 mg Oral TID  . insulin aspart  0-15 Units Subcutaneous TID WC  . insulin aspart  0-5 Units Subcutaneous QHS  . insulin aspart  7 Units Subcutaneous TID WC  . insulin glargine  60 Units Subcutaneous BID  . metoCLOPramide  10 mg Oral QHS  . midodrine  5 mg Oral TID WC  . pantoprazole  40 mg Oral Daily  . sodium chloride flush  3 mL Intravenous Q12H  . traMADol  100 mg Oral TID    Assessment: 52 YOF who presented for R BKA. PMH includes prior MI w/ stent placed in LAD in 2008,  on aspirin and Plavix PTA with a patent stent per heart cath in Jan 2019. This admission, troponins were drawn as part of sepsis workup and have trended 0.03>2.08>23.21>29.49. EKG shows RBBB (not new) and nonspecific ST changes that did not appear new per cardiology. She denied any anginal symptoms. Pharmacy has been consulted for heparin dosing.  Heparin level this morning remains therapeutic at 0.33. Hgb 8.6, plts wnl. No bleeding or infusion issues noted. Planning for left heart cath once shock resolves and patient has been adequately diuresed.  Goal of Therapy:  Heparin level 0.3-0.7 units/mL Monitor platelets by anticoagulation protocol: Yes   Plan:  Cont heparin at 1200 units/hr until cath per HF team Monitor daily heparin level, CBC, s/sx of bleeding   Alanda Slim, PharmD, [facility] Clinical Pharmacist Please see AMION for all Pharmacists' Contact Phone Numbers 03/10/2019, 7:50 AM

## 2019-03-10 NOTE — Progress Notes (Signed)
Pt CVP is reading 2-3. verified by 2nd RN. MD made aware and gave verbal order to stop lasix gtt.  Order followed. Will continue to monitor pt.

## 2019-03-10 NOTE — Progress Notes (Addendum)
Advanced Heart Failure Rounding Note  PCP-Cardiologist: No primary care provider on file.   Subjective:    Negative 6.8 L. No weight. Cr 1.7 -> 1.6 -> 1.42  Feeling better this am. No CP. Denies SOB. Mainly limited by her continued knee pain.   CVP down to 5-6. Coox 67.6%.   Objective:   Weight Range: 99.7 kg Body mass index is 32.46 kg/m.   Vital Signs:   Temp:  [97.8 F (36.6 C)-99 F (37.2 C)] 98.2 F (36.8 C) (03/13 0700) Pulse Rate:  [76-110] 93 (03/13 0630) Resp:  [13-24] 20 (03/13 0630) BP: (82-124)/(50-93) 106/55 (03/13 0630) SpO2:  [92 %-100 %] 99 % (03/13 0630) Last BM Date: 03/07/19  Weight change: Filed Weights   03/06/19 1151 03/07/19 0530 03/09/19 0500  Weight: 97.5 kg 98.5 kg 99.7 kg    Intake/Output:   Intake/Output Summary (Last 24 hours) at 03/10/2019 0801 Last data filed at 03/10/2019 0600 Gross per 24 hour  Intake 1173.5 ml  Output 8050 ml  Net -6876.5 ml    Physical Exam    General:  NAD  HEENT: Normal Neck: Supple. JVP elevated to jaw. Carotids 2+ bilat; no bruits. No lymphadenopathy or thyromegaly appreciated. Cor: PMI nondisplaced. Regular rate & rhythm. No rubs, gallops or murmurs. Lungs: Diminished basilar sounds.  Abdomen: Soft, nontender, nondistended. No hepatosplenomegaly. No bruits or masses. Good bowel sounds. Extremities: No cyanosis or clubbing. S/p R BKA. Dressing C/D/I. LUE with scattered ecchymosis. 1-2+ edema into thighs of LLE.  Neuro: Alert & orientedx3, cranial nerves grossly intact. moves all 4 extremities w/o difficulty. Affect pleasant  Telemetry   NSR 90-100s, personally reviewed.    EKG   No new tracings.    Labs    CBC Recent Labs    03/09/19 0458 03/10/19 0405  WBC 16.6* 16.6*  HGB 7.8* 8.6*  HCT 25.2* 27.4*  MCV 86.3 85.4  PLT 308 176   Basic Metabolic Panel Recent Labs    03/08/19 0310 03/09/19 0458 03/10/19 0405  NA 132* 134* 135  K 5.0 4.0 3.3*  CL 104 104 97*  CO2 21* 22 27   GLUCOSE 218* 172* 135*  BUN 34* 34* 33*  CREATININE 1.73* 1.63* 1.42*  CALCIUM 8.3* 8.4* 8.8*  MG 1.8  --  1.6*  PHOS 2.8  --  2.1*   Liver Function Tests No results for input(s): AST, ALT, ALKPHOS, BILITOT, PROT, ALBUMIN in the last 72 hours. No results for input(s): LIPASE, AMYLASE in the last 72 hours. Cardiac Enzymes Recent Labs    03/07/19 1114 03/08/19 0946  TROPONINI 29.49* 20.09*    BNP: BNP (last 3 results) Recent Labs    02/09/19 1322  BNP 706.0*    ProBNP (last 3 results) No results for input(s): PROBNP in the last 8760 hours.   D-Dimer No results for input(s): DDIMER in the last 72 hours. Hemoglobin A1C No results for input(s): HGBA1C in the last 72 hours. Fasting Lipid Panel No results for input(s): CHOL, HDL, LDLCALC, TRIG, CHOLHDL, LDLDIRECT in the last 72 hours. Thyroid Function Tests No results for input(s): TSH, T4TOTAL, T3FREE, THYROIDAB in the last 72 hours.  Invalid input(s): FREET3  Other results:   Imaging    No results found.   Medications:     Scheduled Medications: . aspirin EC  81 mg Oral Daily  . atorvastatin  40 mg Oral QHS  . cholecalciferol  2,000 Units Oral Daily  . clopidogrel  75 mg Oral Daily  .  digoxin  0.125 mg Oral Daily  . docusate sodium  100 mg Oral BID  . gabapentin  600 mg Oral TID  . insulin aspart  0-15 Units Subcutaneous TID WC  . insulin aspart  0-5 Units Subcutaneous QHS  . insulin aspart  7 Units Subcutaneous TID WC  . insulin glargine  60 Units Subcutaneous BID  . metoCLOPramide  10 mg Oral QHS  . midodrine  5 mg Oral TID WC  . pantoprazole  40 mg Oral Daily  . sodium chloride flush  3 mL Intravenous Q12H  . traMADol  100 mg Oral TID    Infusions: . sodium chloride Stopped (03/08/19 2223)  . sodium chloride    . sodium chloride 10 mL/hr at 03/10/19 0635  . ceFEPime (MAXIPIME) IV Stopped (03/09/19 1203)  . furosemide (LASIX) infusion Stopped (03/10/19 0441)  . heparin 1,200 Units/hr  (03/10/19 0600)  . magnesium sulfate 1 - 4 g bolus IVPB 2 g (03/10/19 0737)  . norepinephrine (LEVOPHED) Adult infusion 2 mcg/min (03/10/19 0600)  . potassium chloride 10 mEq (03/10/19 0726)  . potassium PHOSPHATE IVPB (in mmol) 30 mmol (03/10/19 0649)    PRN Medications: sodium chloride, acetaminophen, bisacodyl, clonazepam, clonazePAM, diazepam, diphenhydrAMINE, diphenoxylate-atropine, HYDROmorphone (DILAUDID) injection, magnesium citrate, metoCLOPramide **OR** metoCLOPramide (REGLAN) injection, ondansetron **OR** ondansetron (ZOFRAN) IV, oxyCODONE, oxyCODONE, polyethylene glycol, sodium chloride flush, zolpidem    Patient Profile   [name] is a 68 y.o. female with h/o CAD s/p LAD stent, chronic systolic CHF, DM2, neuropathy, gastroparesis, anxiety, panic attacks, and breast cancer s/p right mastectomy.   Suffered an NSTEMI in setting of R BKA. Troponin peaked at 23. Dr. Gwenlyn Found assessed and recommended no intervention at this time with relative lack of symptoms. CHF team consulted for med titration.   Assessment/Plan   1. Elevated troponin, known CAD, recent heart cath 12/2017 - 0.03 --> 2.08 --> 23.21 -> 29.5 -> 20.09 -EKG with RBBB (not new) and nonspecific ST changes that do not appear new when compared to previous tracings - Most recent 01/14/18 with moderate stenosis in the distal LAD (60%) and OM1 (50%) and patent stent in the mid LAD - Continue ASA and plavix.  - No s/s of ischemia.    - Continue Heparin.  - Continue Plavix - Plan R/LHC this afternoon.   2. Septic shock in setting of R ankle infection; Now s/p R BKA.  - Covering with Vanc/Cefepime.  - Home HTN med held.  - Phenylephrine stopped yesterday. Now on NE at 3. Diuresing as below.  - Also newly on midodrine 5 mg TID - If cardiogenic component suspected, could place central access for CVP/Coox.   3. Acute on chronic systolic heart failure, ICM as above - Echo 03/07/2019 LVEF 20-25%, Severe LAE,  Mild RAE.  - Volume status much improved. CVP 5-6 - Hold lasix. Cath this afternoon to further define volume status.  - Remains on NE at 3. Suspect can continue to wean.  - No ACE/ARB in setting of AKI.  - No BB for now with acute decompensation.   4. DM - uncontrolled - A1c 7.8% last month. Per primary.   5. AKI - Cr 1.29 -> 1.45 -> 1.82 -> 1.73 -> 1.63 -> 1.42.  6. Right BKA - Per Ortho. Stable.   - Pt is refusing rehab.   R/LHC this afternoon. If can wean of NE, can likely get out of ICU.   Medication concerns reviewed with patient and pharmacy team. Barriers identified: No change.  Length of Stay: [place], [place]  03/10/2019, 8:01 AM  Advanced Heart Failure Team Pager [phone] (M-F; 7a - 4p)  Please contact [facility] for night-coverage after hours (4p -7a ) and weekends on [email or web address]  Patient seen with PA, agree with the above note.  She feels good today. Co-ox 68%, CVP 4-5.  She diuresed well yesterday, creatinine down to 1.42.  Still on NE 2 + midodrine 5 mg tid.    On exam, JVP not elevated with trace ankle edema on left. S/p right BKA. Regular S1S2.   1. CAD: Patient has history of prior anterior MI with LAD stent. She developed hypotension but no significant ECG changes or chest pain post-op 3/9. Troponin up to 29, now trending down. Suspect peri-op NSTEMI.  - Once septic shock is resolved and creatinine stabilizes, will need left/right heart cath today. I discussed risks/benefits with patient and she agrees to procedure.   - Continue heparin gtt until cath, continue ASA 81, continue statin.  - Restarted Plavix at 75 mg daily.   2. Shock: Suspect mixed septic/cardiogenic. Septic shock with osteomyelitis, WBCs down to 16 and afebrile, no culture data available yet.  - Currently on cefepime.   - Support BP as needed with norepinephrine, now at 2.  Can wean for SBP 90 and above.  3. Acute on chronic systolic CHF: Ischemic cardiomyopathy.  Apparently, baseline EF is in the 25-30% range. Echo this admission with EF 20-25%. Volume status much better with CVP 4-5.  Co-ox 68% this morning.   - Should be able to titrate off norepinephrine this morning. Hopefully can get her off midodrine as well.  - Continue digoxin 0.125 daily.  - Stop Lasix, will dose po based on RHC.   4. AKI: Creatinine up to 1.8, now down to 1.4. Suspect prerenal in setting of septic shock.  5. Anemia: Hgb 7.8 => 8.6 but no overt bleeding.  Follow closely, FOBT.   CRITICAL CARE Performed by: Loralie Champagne  Total critical care time: 35 minutes  Critical care time was exclusive of separately billable procedures and treating other patients.  Critical care was necessary to treat or prevent imminent or life-threatening deterioration.  Critical care was time spent personally by me on the following activities: development of treatment plan with patient and/or surrogate as well as nursing, discussions with consultants, evaluation of patient's response to treatment, examination of patient, obtaining history from patient or surrogate, ordering and performing treatments and interventions, ordering and review of laboratory studies, ordering and review of radiographic studies, pulse oximetry and re-evaluation of patient's condition.  Loralie Champagne 03/10/2019 8:49 AM

## 2019-03-10 NOTE — Progress Notes (Signed)
Pharmacy Antibiotic Note  [name] is a 68 y.o. female admitted on 03/06/2019 for R BKA. Patient fell a month ago and has had poor wound healing, fell on it again and was found to have drainage and necrotic tissue. Vancomycin d/c'd 3/12, continues on cefepime.  Patient is afebrile, but WBC decreasing. Scr is down to 1.4 today.   Plan: Continue cefepime 2g q 24 hrs Monitor renal function, C&S, clinical improvement.  Height: 5\' 9"  (175.3 cm) Weight: 219 lb 12.8 oz (99.7 kg) IBW/kg (Calculated) : 66.2  Temp (24hrs), Avg:98.3 F (36.8 C), Min:97.8 F (36.6 C), Max:99 F (37.2 C)  Recent Labs  Lab 03/07/19 0616 03/07/19 1541 03/08/19 0310 03/09/19 0458 03/10/19 0405  WBC 23.5* 14.9* 19.5* 16.6* 16.6*  CREATININE 1.45* 1.82* 1.73* 1.63* 1.42*    Estimated Creatinine Clearance: 47 mL/min (A) (by C-G formula based on SCr of 1.42 mg/dL (H)).    Allergies  Allergen Reactions  . Cymbalta [Duloxetine Hcl] Nausea And Vomiting and Other (See Comments)    Vomiting and stiff muscles in legs  . Toradol [Ketorolac Tromethamine] Hives    Antimicrobials this admission: Cefepime 3/9 >> Vanc 3/9 >> 3/12  Dose adjustments this admission: N/A  Microbiology results: 3/9 Bcx: NGTD 3/9 MRSA PCR: neg  Thank you for allowing pharmacy to be a part of this patient's care.  Marguerite Olea, [facility] Clinical Pharmacist Phone [phone]  03/10/2019 10:37 AM

## 2019-03-10 NOTE — Progress Notes (Signed)
eLink Physician-Brief Progress Note Patient Name: [name] DOB: 26-Apr-1951 MRN: [id number]   Date of Service  03/10/2019  HPI/Events of Note  Hypokalemia, hypophos, hypomag  eICU Interventions  Potassium, mag and PHos replaced     Intervention Category Intermediate Interventions: Electrolyte abnormality - evaluation and management  DETERDING,ELIZABETH 03/10/2019, 5:58 AM

## 2019-03-10 NOTE — Progress Notes (Signed)
Inpatient Rehab Admissions Coordinator:   I attempted to make contact with pt at the bedside. She was off the unit for a heart cath.  No family listed under contacts.  Will follow up on Monday for Inpatient Rehab Assessment.  Please call with questions.    Shann Medal, PT, DPT Admissions Coordinator [phone] 03/10/19  2:41 PM

## 2019-03-10 NOTE — Progress Notes (Signed)
NAME:  [name], MRN:  [id number], DOB:  04-Jul-1951, LOS: 4 ADMISSION DATE:  03/06/2019, CONSULTATION DATE:  03/06/2019 REFERRING MD:  Dr. Mardelle Matte, CHIEF COMPLAINT:  Hypotension  Brief History   2 yoF with systolic HF and severe diabetes s/p initial fall with R ankle fracture 2/19.  F/u w/ ortho and scheduled for ORIF but found to have significant damage to splint with bleeding and open tibia fracture with purulent drainage and necrotic tissue.  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  EBL ~700.  Extubated post op but was hypotensive requiring pressors.  Labs pending.    Past Medical History  Systolic heart failure (0/2542 EF 25-30%), CAD, DM, diabetic neuropathy, panic attacks, prior breast surgery s/p R mastectomy, gastroparresis  Significant Hospital Events   2/19 initial R ankle fracture 3/12 still pressor dependent. Heart failure team evaluated in consult. CVL placed.   Consults:   Procedures:  3/9 s/p R BKA 3/9 thru 3/11: still pressor dependent. Seen by HF team  Significant Diagnostic Tests:   Micro Data:  3/9 BC x 2 >>NTD  Antimicrobials:  3/9 ancef preop 3/9 vanc >>3/12 3/9 cefepime >>  Interim history/subjective:  Remains on pressors, no events overnight, no new complaints  Objective   Blood pressure (!) 86/52, pulse 82, temperature 98.2 F (36.8 C), temperature source Oral, resp. rate 20, height 5\' 9"  (1.753 m), weight 99.7 kg, SpO2 97 %. CVP:  [2 mmHg-14 mmHg] 4 mmHg      Intake/Output Summary (Last 24 hours) at 03/10/2019 7062 Last data filed at 03/10/2019 3762 Gross per 24 hour  Intake 1368.92 ml  Output 8050 ml  Net -6681.08 ml   Filed Weights   03/06/19 1151 03/07/19 0530 03/09/19 0500  Weight: 97.5 kg 98.5 kg 99.7 kg   Examination:  General: Well appearing, NAD HENT: [place]/AT, PERRL, EOM-I Pulm: CTA bilaterally Card: RRR, Nl S1/S2 and -M/R/G Abd: Soft, NT, ND and +BS Ext warm, dependent edema scattered areas of ecchymosis left BKA wrap intact  Gu: Clear, yellow Neuro: alert and interactive, moving all ext to command  I reviewed CXR myself, pulmonary edema noted  Resolved Hospital Problem list    Assessment & Plan:    Open right ankle fracture s/p R BKA Plan Post op per ortho Decrease fentanyl given hemodynamics  Shock - septic vs hemorraghic, r/o cardiac process with CAD/ HFrPF hx -seen by HF team in consult today (3/11).  Plan F/U Coox and CVP Milrinone Titrate levo to off today Continue to hold lasix Hold metolazone, entresto  No Ace I or ARB given AKI No BB w/ shock   CAD, HLD Plan Tele monitoring Cath on Monday Advanced heart failure team  AKI. Scr was rising; now better Plan  Trend chem Maximize CO Strict I&O  DM Plan:  SSI  CBG q4 Adding basal dosing   Peripheral neuropathy Plan Was on neurontin, flexeril and diazepam previously. Holding these still  GERD, gastroparesis plan Cont PPI and resume bentyl   Anemia of chronic disease Plan Trend cbc Transfuse for hgb <7  Hold in the ICU for pressor support  The patient is critically ill with multiple organ systems failure and requires high complexity decision making for assessment and support, frequent evaluation and titration of therapies, application of advanced monitoring technologies and extensive interpretation of multiple databases.   Critical Care Time devoted to patient care services described in this note is  32  Minutes. This time reflects time of care of this signee Dr Michaelene Song  Nelda Marseille. This critical care time does not reflect procedure time, or teaching time or supervisory time of PA/NP/Med student/Med Resident etc but could involve care discussion time.  Rush Farmer, M.D. [facility] Pulmonary/Critical Care Medicine. Pager: [phone]. After hours pager: [phone].

## 2019-03-10 NOTE — Progress Notes (Addendum)
Patient ID: NEESHA LANGTON, female   DOB: 1951/10/13, 68 y.o.   MRN: [id number]     Subjective:  Patient reports pain as mild.  Patient in bed and in no acute distress  Objective:   VITALS:   Vitals:   03/10/19 0600 03/10/19 0630 03/10/19 0700 03/10/19 0800  BP: 113/68 (!) 106/55 116/64 (!) 86/52  Pulse: 89 93 88 82  Resp: 17 20  20   Temp:   98.2 F (36.8 C)   TempSrc:   Oral   SpO2: 96% 99% 98% 97%  Weight:      Height:        ABD soft Sensation intact distally Dorsiflexion/Plantar flexion intact Incision: dressing C/D/I and no drainage   Lab Results  Component Value Date   WBC 16.6 (H) 03/10/2019   HGB 8.6 (L) 03/10/2019   HCT 27.4 (L) 03/10/2019   MCV 85.4 03/10/2019   PLT 363 03/10/2019   BMET    Component Value Date/Time   NA 135 03/10/2019 0405   NA 139 01/03/2013 1311   NA 138 03/12/2009 1059   K 3.3 (L) 03/10/2019 0405   K 3.7 01/03/2013 1311   K 4.6 03/12/2009 1059   CL 97 (L) 03/10/2019 0405   CL 106 01/03/2013 1311   CL 96 (L) 03/12/2009 1059   CO2 27 03/10/2019 0405   CO2 22 01/03/2013 1311   CO2 28 03/12/2009 1059   GLUCOSE 135 (H) 03/10/2019 0405   GLUCOSE 92 01/03/2013 1311   GLUCOSE 280 (H) 03/12/2009 1059   BUN 33 (H) 03/10/2019 0405   BUN 13 01/03/2013 1311   BUN 13 03/12/2009 1059   CREATININE 1.42 (H) 03/10/2019 0405   CREATININE 1.17 01/03/2013 1311   CREATININE 1.3 (H) 03/12/2009 1059   CALCIUM 8.8 (L) 03/10/2019 0405   CALCIUM 9.0 01/03/2013 1311   CALCIUM 9.5 03/12/2009 1059   GFRNONAA 38 (L) 03/10/2019 0405   GFRNONAA 50 (L) 01/03/2013 1311   GFRAA 44 (L) 03/10/2019 0405   GFRAA 58 (L) 01/03/2013 1311     Assessment/Plan: 4 Days Post-Op   Principal Problem:   Below-knee amputation of right lower extremity (HCC) Active Problems:   S/P below knee amputation, right (HCC)   Hypoxemia   Shock circulatory (HCC)   Acute pain   Acute encephalopathy   Non-STEMI (non-ST elevated myocardial infarction) ([place])  Encounter for central line placement   Advance diet Continue plan per CC team Splint at all times   Lunette Stands 03/10/2019, 9:00 AM  Discussed and agree with above.   Marchia Bond, MD Cell [phone]

## 2019-03-10 NOTE — Progress Notes (Signed)
Physical Therapy Treatment Patient Details Name: [name] MRN: [id number] DOB: 15-Jul-1951 Today's Date: 03/10/2019    History of Present Illness 13 yoF with systolic HF and severe diabetes s/p initial fall with R ankle fracture 2/19.  F/u w/ ortho and scheduled for ORIF but found to have significant damage to splint with bleeding and open tibia fracture with purulent drainage and necrotic tissue.  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  EBL ~700.  Extubated post op but was hypotensive requiring pressors.  Labs pending.     PT Comments    Pt progressing well with mobility. She required mod assist bed mobility and +2 mod assist lateral/scoot transfer bed to recliner. Attempted sit to stand with RW +2 mod assist. Pt able to clear bottom from bed but unable to attain full upright stance. Pt positioned in recliner with foot elevated at end of session. RN educated on return transfer to bed. Pt with excellent participation and motivation this session. She was encouraged by her ability to transfer to the recliner.    Follow Up Recommendations  CIR     Equipment Recommendations  Wheelchair (measurements PT);Rolling walker with 5" wheels    Recommendations for Other Services Rehab consult     Precautions / Restrictions Precautions Precautions: Fall;Other (comment) Precaution Comments: watch BP Restrictions Weight Bearing Restrictions: Yes    Mobility  Bed Mobility Overal bed mobility: Needs Assistance Bed Mobility: Supine to Sit     Supine to sit: Mod assist;HOB elevated     General bed mobility comments: +rail, cues for sequencing, assist to elevate trunk and scoot to EOB  Transfers Overall transfer level: Needs assistance Equipment used: Rolling walker (2 wheeled) Transfers: Sit to/from Stand;Lateral/Scoot Transfers Sit to Stand: Mod assist;+2 physical assistance        Lateral/Scoot Transfers: +2 physical assistance;Mod assist General transfer comment: Pt able to  clear bottom from bed for sit to stand with RW but unable to attain full upright stance. Lateral/scoot transfer toward L using bed pad to assist. Cues for sequencing. Pt with good participation.   Ambulation/Gait             General Gait Details: unable    Stairs             Wheelchair Mobility    Modified Rankin (Stroke Patients Only)       Balance Overall balance assessment: Needs assistance Sitting-balance support: No upper extremity supported;Feet unsupported Sitting balance-Leahy Scale: Fair Sitting balance - Comments: supervision sitting EOB                                    Cognition Arousal/Alertness: Awake/alert Behavior During Therapy: WFL for tasks assessed/performed Overall Cognitive Status: Within Functional Limits for tasks assessed                                        Exercises      General Comments General comments (skin integrity, edema, etc.): VSS      Pertinent Vitals/Pain Pain Assessment: Faces Faces Pain Scale: Hurts even more Pain Location: R knee Pain Descriptors / Indicators: Grimacing;Guarding;Sore Pain Intervention(s): Monitored during session;Repositioned;Limited activity within patient's tolerance    Home Living                      Prior Function  PT Goals (current goals can now be found in the care plan section) Acute Rehab PT Goals Patient Stated Goal: to go to rehab at  the hospital PT Goal Formulation: With patient Time For Goal Achievement: 03/22/19 Potential to Achieve Goals: Good Progress towards PT goals: Progressing toward goals    Frequency    Min 3X/week      PT Plan Current plan remains appropriate    Co-evaluation              AM-PAC PT "6 Clicks" Mobility   Outcome Measure  Help needed turning from your back to your side while in a flat bed without using bedrails?: A Little Help needed moving from lying on your back to sitting on  the side of a flat bed without using bedrails?: A Lot Help needed moving to and from a bed to a chair (including a wheelchair)?: A Lot Help needed standing up from a chair using your arms (e.g., wheelchair or bedside chair)?: A Lot Help needed to walk in hospital room?: Total Help needed climbing 3-5 steps with a railing? : Total 6 Click Score: 11    End of Session Equipment Utilized During Treatment: Gait belt Activity Tolerance: Patient tolerated treatment well Patient left: in chair;with call bell/phone within reach Nurse Communication: Mobility status PT Visit Diagnosis: Unsteadiness on feet (R26.81)     Time: 8546-2703 PT Time Calculation (min) (ACUTE ONLY): 25 min  Charges:  $Therapeutic Activity: 23-37 mins                     Lorrin Goodell, PT  Office # [phone] Pager [phone]    Lorriane Shire 03/10/2019, 11:35 AM

## 2019-03-10 NOTE — H&P (View-Only) (Signed)
Advanced Heart Failure Rounding Note  PCP-Cardiologist: No primary care provider on file.   Subjective:    Negative 6.8 L. No weight. Cr 1.7 -> 1.6 -> 1.42  Feeling better this am. No CP. Denies SOB. Mainly limited by her continued knee pain.   CVP down to 5-6. Coox 67.6%.   Objective:   Weight Range: 99.7 kg Body mass index is 32.46 kg/m.   Vital Signs:   Temp:  [97.8 F (36.6 C)-99 F (37.2 C)] 98.2 F (36.8 C) (03/13 0700) Pulse Rate:  [76-110] 93 (03/13 0630) Resp:  [13-24] 20 (03/13 0630) BP: (82-124)/(50-93) 106/55 (03/13 0630) SpO2:  [92 %-100 %] 99 % (03/13 0630) Last BM Date: 03/07/19  Weight change: Filed Weights   03/06/19 1151 03/07/19 0530 03/09/19 0500  Weight: 97.5 kg 98.5 kg 99.7 kg    Intake/Output:   Intake/Output Summary (Last 24 hours) at 03/10/2019 0801 Last data filed at 03/10/2019 0600 Gross per 24 hour  Intake 1173.5 ml  Output 8050 ml  Net -6876.5 ml    Physical Exam    General:  NAD  HEENT: Normal Neck: Supple. JVP elevated to jaw. Carotids 2+ bilat; no bruits. No lymphadenopathy or thyromegaly appreciated. Cor: PMI nondisplaced. Regular rate & rhythm. No rubs, gallops or murmurs. Lungs: Diminished basilar sounds.  Abdomen: Soft, nontender, nondistended. No hepatosplenomegaly. No bruits or masses. Good bowel sounds. Extremities: No cyanosis or clubbing. S/p R BKA. Dressing C/D/I. LUE with scattered ecchymosis. 1-2+ edema into thighs of LLE.  Neuro: Alert & orientedx3, cranial nerves grossly intact. moves all 4 extremities w/o difficulty. Affect pleasant  Telemetry   NSR 90-100s, personally reviewed.    EKG   No new tracings.    Labs    CBC Recent Labs    03/09/19 0458 03/10/19 0405  WBC 16.6* 16.6*  HGB 7.8* 8.6*  HCT 25.2* 27.4*  MCV 86.3 85.4  PLT 308 093   Basic Metabolic Panel Recent Labs    03/08/19 0310 03/09/19 0458 03/10/19 0405  NA 132* 134* 135  K 5.0 4.0 3.3*  CL 104 104 97*  CO2 21* 22 27   GLUCOSE 218* 172* 135*  BUN 34* 34* 33*  CREATININE 1.73* 1.63* 1.42*  CALCIUM 8.3* 8.4* 8.8*  MG 1.8  --  1.6*  PHOS 2.8  --  2.1*   Liver Function Tests No results for input(s): AST, ALT, ALKPHOS, BILITOT, PROT, ALBUMIN in the last 72 hours. No results for input(s): LIPASE, AMYLASE in the last 72 hours. Cardiac Enzymes Recent Labs    03/07/19 1114 03/08/19 0946  TROPONINI 29.49* 20.09*    BNP: BNP (last 3 results) Recent Labs    02/09/19 1322  BNP 706.0*    ProBNP (last 3 results) No results for input(s): PROBNP in the last 8760 hours.   D-Dimer No results for input(s): DDIMER in the last 72 hours. Hemoglobin A1C No results for input(s): HGBA1C in the last 72 hours. Fasting Lipid Panel No results for input(s): CHOL, HDL, LDLCALC, TRIG, CHOLHDL, LDLDIRECT in the last 72 hours. Thyroid Function Tests No results for input(s): TSH, T4TOTAL, T3FREE, THYROIDAB in the last 72 hours.  Invalid input(s): FREET3  Other results:   Imaging    No results found.   Medications:     Scheduled Medications: . aspirin EC  81 mg Oral Daily  . atorvastatin  40 mg Oral QHS  . cholecalciferol  2,000 Units Oral Daily  . clopidogrel  75 mg Oral Daily  .  digoxin  0.125 mg Oral Daily  . docusate sodium  100 mg Oral BID  . gabapentin  600 mg Oral TID  . insulin aspart  0-15 Units Subcutaneous TID WC  . insulin aspart  0-5 Units Subcutaneous QHS  . insulin aspart  7 Units Subcutaneous TID WC  . insulin glargine  60 Units Subcutaneous BID  . metoCLOPramide  10 mg Oral QHS  . midodrine  5 mg Oral TID WC  . pantoprazole  40 mg Oral Daily  . sodium chloride flush  3 mL Intravenous Q12H  . traMADol  100 mg Oral TID    Infusions: . sodium chloride Stopped (03/08/19 2223)  . sodium chloride    . sodium chloride 10 mL/hr at 03/10/19 0635  . ceFEPime (MAXIPIME) IV Stopped (03/09/19 1203)  . furosemide (LASIX) infusion Stopped (03/10/19 0441)  . heparin 1,200 Units/hr  (03/10/19 0600)  . magnesium sulfate 1 - 4 g bolus IVPB 2 g (03/10/19 0737)  . norepinephrine (LEVOPHED) Adult infusion 2 mcg/min (03/10/19 0600)  . potassium chloride 10 mEq (03/10/19 0726)  . potassium PHOSPHATE IVPB (in mmol) 30 mmol (03/10/19 0649)    PRN Medications: sodium chloride, acetaminophen, bisacodyl, clonazepam, clonazePAM, diazepam, diphenhydrAMINE, diphenoxylate-atropine, HYDROmorphone (DILAUDID) injection, magnesium citrate, metoCLOPramide **OR** metoCLOPramide (REGLAN) injection, ondansetron **OR** ondansetron (ZOFRAN) IV, oxyCODONE, oxyCODONE, polyethylene glycol, sodium chloride flush, zolpidem    Patient Profile   [name] is a 68 y.o. female with h/o CAD s/p LAD stent, chronic systolic CHF, DM2, neuropathy, gastroparesis, anxiety, panic attacks, and breast cancer s/p right mastectomy.   Suffered an NSTEMI in setting of R BKA. Troponin peaked at 23. Dr. Gwenlyn Found assessed and recommended no intervention at this time with relative lack of symptoms. CHF team consulted for med titration.   Assessment/Plan   1. Elevated troponin, known CAD, recent heart cath 12/2017 - 0.03 --> 2.08 --> 23.21 -> 29.5 -> 20.09 -EKG with RBBB (not new) and nonspecific ST changes that do not appear new when compared to previous tracings - Most recent 01/14/18 with moderate stenosis in the distal LAD (60%) and OM1 (50%) and patent stent in the mid LAD - Continue ASA and plavix.  - No s/s of ischemia.    - Continue Heparin.  - Continue Plavix - Plan R/LHC this afternoon.   2. Septic shock in setting of R ankle infection; Now s/p R BKA.  - Covering with Vanc/Cefepime.  - Home HTN med held.  - Phenylephrine stopped yesterday. Now on NE at 3. Diuresing as below.  - Also newly on midodrine 5 mg TID - If cardiogenic component suspected, could place central access for CVP/Coox.   3. Acute on chronic systolic heart failure, ICM as above - Echo 03/07/2019 LVEF 20-25%, Severe LAE,  Mild RAE.  - Volume status much improved. CVP 5-6 - Hold lasix. Cath this afternoon to further define volume status.  - Remains on NE at 3. Suspect can continue to wean.  - No ACE/ARB in setting of AKI.  - No BB for now with acute decompensation.   4. DM - uncontrolled - A1c 7.8% last month. Per primary.   5. AKI - Cr 1.29 -> 1.45 -> 1.82 -> 1.73 -> 1.63 -> 1.42.  6. Right BKA - Per Ortho. Stable.   - Pt is refusing rehab.   R/LHC this afternoon. If can wean of NE, can likely get out of ICU.   Medication concerns reviewed with patient and pharmacy team. Barriers identified: No change.  Length of Stay: [place], [place]  03/10/2019, 8:01 AM  Advanced Heart Failure Team Pager [phone] (M-F; 7a - 4p)  Please contact [facility] for night-coverage after hours (4p -7a ) and weekends on [email or web address]  Patient seen with PA, agree with the above note.  She feels good today. Co-ox 68%, CVP 4-5.  She diuresed well yesterday, creatinine down to 1.42.  Still on NE 2 + midodrine 5 mg tid.    On exam, JVP not elevated with trace ankle edema on left. S/p right BKA. Regular S1S2.   1. CAD: Patient has history of prior anterior MI with LAD stent. She developed hypotension but no significant ECG changes or chest pain post-op 3/9. Troponin up to 29, now trending down. Suspect peri-op NSTEMI.  - Once septic shock is resolved and creatinine stabilizes, will need left/right heart cath today. I discussed risks/benefits with patient and she agrees to procedure.   - Continue heparin gtt until cath, continue ASA 81, continue statin.  - Restarted Plavix at 75 mg daily.   2. Shock: Suspect mixed septic/cardiogenic. Septic shock with osteomyelitis, WBCs down to 16 and afebrile, no culture data available yet.  - Currently on cefepime.   - Support BP as needed with norepinephrine, now at 2.  Can wean for SBP 90 and above.  3. Acute on chronic systolic CHF: Ischemic cardiomyopathy.  Apparently, baseline EF is in the 25-30% range. Echo this admission with EF 20-25%. Volume status much better with CVP 4-5.  Co-ox 68% this morning.   - Should be able to titrate off norepinephrine this morning. Hopefully can get her off midodrine as well.  - Continue digoxin 0.125 daily.  - Stop Lasix, will dose po based on RHC.   4. AKI: Creatinine up to 1.8, now down to 1.4. Suspect prerenal in setting of septic shock.  5. Anemia: Hgb 7.8 => 8.6 but no overt bleeding.  Follow closely, FOBT.   CRITICAL CARE Performed by: Loralie Champagne  Total critical care time: 35 minutes  Critical care time was exclusive of separately billable procedures and treating other patients.  Critical care was necessary to treat or prevent imminent or life-threatening deterioration.  Critical care was time spent personally by me on the following activities: development of treatment plan with patient and/or surrogate as well as nursing, discussions with consultants, evaluation of patient's response to treatment, examination of patient, obtaining history from patient or surrogate, ordering and performing treatments and interventions, ordering and review of laboratory studies, ordering and review of radiographic studies, pulse oximetry and re-evaluation of patient's condition.  Loralie Champagne 03/10/2019 8:49 AM

## 2019-03-11 ENCOUNTER — Inpatient Hospital Stay (HOSPITAL_COMMUNITY): Payer: Medicare Other

## 2019-03-11 LAB — BASIC METABOLIC PANEL
Anion gap: 12 (ref 5–15)
BUN: 27 mg/dL — ABNORMAL HIGH (ref 8–23)
CALCIUM: 9 mg/dL (ref 8.9–10.3)
CO2: 29 mmol/L (ref 22–32)
CREATININE: 1.29 mg/dL — AB (ref 0.44–1.00)
Chloride: 95 mmol/L — ABNORMAL LOW (ref 98–111)
GFR calc non Af Amer: 42 mL/min — ABNORMAL LOW (ref >60)
GFR, EST AFRICAN AMERICAN: 49 mL/min — AB (ref >60)
Glucose, Bld: 72 mg/dL (ref 70–99)
Potassium: 3.5 mmol/L (ref 3.5–5.1)
Sodium: 136 mmol/L (ref 135–145)

## 2019-03-11 LAB — COOXEMETRY PANEL
Carboxyhemoglobin: 1.4 % (ref 0.5–1.5)
Methemoglobin: 1.8 % — ABNORMAL HIGH (ref 0.0–1.5)
O2 Saturation: 49.9 %
Total hemoglobin: 9.3 g/dL — ABNORMAL LOW (ref 12.0–16.0)

## 2019-03-11 LAB — PHOSPHORUS: Phosphorus: 3.9 mg/dL (ref 2.5–4.6)

## 2019-03-11 LAB — CBC
HCT: 27.6 % — ABNORMAL LOW (ref 36.0–46.0)
Hemoglobin: 8.8 g/dL — ABNORMAL LOW (ref 12.0–15.0)
MCH: 27 pg (ref 26.0–34.0)
MCHC: 31.9 g/dL (ref 30.0–36.0)
MCV: 84.7 fL (ref 80.0–100.0)
PLATELETS: 350 10*3/uL (ref 150–400)
RBC: 3.26 MIL/uL — ABNORMAL LOW (ref 3.87–5.11)
RDW: 17.2 % — ABNORMAL HIGH (ref 11.5–15.5)
WBC: 12.4 10*3/uL — ABNORMAL HIGH (ref 4.0–10.5)
nRBC: 0.3 % — ABNORMAL HIGH (ref 0.0–0.2)

## 2019-03-11 LAB — GLUCOSE, CAPILLARY
Glucose-Capillary: 74 mg/dL (ref 70–99)
Glucose-Capillary: 130 mg/dL — ABNORMAL HIGH (ref 70–99)
Glucose-Capillary: 134 mg/dL — ABNORMAL HIGH (ref 70–99)
Glucose-Capillary: 113 mg/dL — ABNORMAL HIGH (ref 70–99)

## 2019-03-11 LAB — CULTURE, BLOOD (ROUTINE X 2)
Culture: NO GROWTH
Culture: NO GROWTH
SPECIAL REQUESTS: ADEQUATE

## 2019-03-11 LAB — IRON AND TIBC
Iron: 16 ug/dL — ABNORMAL LOW (ref 28–170)
SATURATION RATIOS: 5 % — AB (ref 10.4–31.8)
TIBC: 350 ug/dL (ref 250–450)
UIBC: 334 ug/dL

## 2019-03-11 LAB — FERRITIN: Ferritin: 48 ng/mL (ref 11–307)

## 2019-03-11 LAB — MAGNESIUM: Magnesium: 2 mg/dL (ref 1.7–2.4)

## 2019-03-11 MED ORDER — POTASSIUM CHLORIDE CRYS ER 20 MEQ PO TBCR
40.0000 meq | EXTENDED_RELEASE_TABLET | Freq: Three times a day (TID) | ORAL | Status: AC
  Administered 2019-03-11: 40 meq via ORAL
  Filled 2019-03-11: qty 2

## 2019-03-11 MED ORDER — POTASSIUM CHLORIDE CRYS ER 20 MEQ PO TBCR
40.0000 meq | EXTENDED_RELEASE_TABLET | Freq: Once | ORAL | Status: AC
  Administered 2019-03-11: 40 meq via ORAL
  Filled 2019-03-11: qty 2

## 2019-03-11 MED ORDER — POTASSIUM CHLORIDE 10 MEQ/100ML IV SOLN
10.0000 meq | INTRAVENOUS | Status: AC
  Administered 2019-03-11 (×4): 10 meq via INTRAVENOUS
  Filled 2019-03-11 (×4): qty 100

## 2019-03-11 MED ORDER — TORSEMIDE 20 MG PO TABS
40.0000 mg | ORAL_TABLET | Freq: Two times a day (BID) | ORAL | Status: DC
  Administered 2019-03-11 – 2019-03-12 (×2): 40 mg via ORAL
  Filled 2019-03-11 (×2): qty 2

## 2019-03-11 MED ORDER — SPIRONOLACTONE 12.5 MG HALF TABLET
12.5000 mg | ORAL_TABLET | Freq: Every day | ORAL | Status: DC
  Administered 2019-03-11 – 2019-03-13 (×3): 12.5 mg via ORAL
  Filled 2019-03-11 (×3): qty 1

## 2019-03-11 NOTE — Progress Notes (Signed)
Patient ID: [name], female   DOB: [birth year], 68 y.o.   MRN: [id number]     Advanced Heart Failure Rounding Note  PCP-Cardiologist: No primary care provider on file.   Subjective:    I/O net -3369 on Lasix gtt.  No weight. Cr 1.7 -> 1.6 -> 1.42 -> 1.29  SBP 90s-100s today, remains on norepinephrine 1. CVP 8.   No chest pain or dyspnea this morning.   LHC/RHC (3/14): Hemodynamics:  LV 101/39 Ao 92/50 RA 14 CO (Fick) 6.28 CI (Fick) 2.92 Diagnostic  Dominance: Right  Left Main  Minimal disease.  Left Anterior Descending  Large LAD wrapping around apex. There is a patent proximal LAD stent. Diffuse luminal irregularities. There was probably a diagonal originating from the mid LAD that was occluded [place].  Ramus Intermedius  Small to moderate ramus with 95% ostial/proximal stenosis.  Left Circumflex  50% stenosis mid LCx and 50% stenosis distal LCx. 50% stenosis in proximal small-moderate OM1.  Right Coronary Artery  50% stenosis distal RCA/ostial PLV.     Objective:   Weight Range: 93.2 kg Body mass index is 30.34 kg/m.   Vital Signs:   Temp:  [97.9 F (36.6 C)] 97.9 F (36.6 C) (03/13 1950) Pulse Rate:  [70-113] 77 (03/14 0700) Resp:  [8-28] 16 (03/14 0700) BP: (84-127)/(35-63) 101/54 (03/14 0700) SpO2:  [0 %-100 %] 95 % (03/14 0700) Weight:  [93.2 kg] 93.2 kg (03/14 0500) Last BM Date: 03/07/19  Weight change: Filed Weights   03/07/19 0530 03/09/19 0500 03/11/19 0500  Weight: 98.5 kg 99.7 kg 93.2 kg    Intake/Output:   Intake/Output Summary (Last 24 hours) at 03/11/2019 0905 Last data filed at 03/11/2019 0600 Gross per 24 hour  Intake 790.31 ml  Output 4225 ml  Net -3434.69 ml    Physical Exam    General: NAD Neck: JVP 8 cm, no thyromegaly or thyroid nodule.  Lungs: Clear to auscultation bilaterally with normal respiratory effort. CV: Nondisplaced PMI.  Heart regular S1/S2, no S3/S4, no murmur. No edema.   Abdomen: Soft,  nontender, no hepatosplenomegaly, no distention.  Skin: Intact without lesions or rashes.  Neurologic: Alert and oriented x 3.  Psych: Normal affect. Extremities: No clubbing or cyanosis. Right BKA.  HEENT: Normal.    Telemetry   NSR 90s, personally reviewed.    EKG   No new tracings.    Labs    CBC Recent Labs    03/10/19 0405 03/10/19 1543 03/11/19 0625  WBC 16.6*  --  12.4*  HGB 8.6* 8.5* 8.8*  HCT 27.4* 25.0* 27.6*  MCV 85.4  --  84.7  PLT 363  --  681   Basic Metabolic Panel Recent Labs    03/10/19 0405 03/10/19 1543 03/11/19 0625  NA 135 134* 136  K 3.3* 4.6 3.5  CL 97*  --  95*  CO2 27  --  29  GLUCOSE 135*  --  72  BUN 33*  --  27*  CREATININE 1.42*  --  1.29*  CALCIUM 8.8*  --  9.0  MG 1.6*  --  2.0  PHOS 2.1*  --  3.9   Liver Function Tests No results for input(s): AST, ALT, ALKPHOS, BILITOT, PROT, ALBUMIN in the last 72 hours. No results for input(s): LIPASE, AMYLASE in the last 72 hours. Cardiac Enzymes Recent Labs    03/08/19 0946  TROPONINI 20.09*    BNP: BNP (last 3 results) Recent Labs    02/09/19 1322  BNP  706.0*    ProBNP (last 3 results) No results for input(s): PROBNP in the last 8760 hours.   D-Dimer No results for input(s): DDIMER in the last 72 hours. Hemoglobin A1C No results for input(s): HGBA1C in the last 72 hours. Fasting Lipid Panel No results for input(s): CHOL, HDL, LDLCALC, TRIG, CHOLHDL, LDLDIRECT in the last 72 hours. Thyroid Function Tests No results for input(s): TSH, T4TOTAL, T3FREE, THYROIDAB in the last 72 hours.  Invalid input(s): FREET3  Other results:   Imaging    Dg Chest Port 1 View  Result Date: 03/11/2019 CLINICAL DATA:  ETT EXAM: PORTABLE CHEST 1 VIEW COMPARISON:  03/08/2019 FINDINGS: No endotracheal tube is visualized. Lungs are essentially clear.  No pleural effusion or pneumothorax. The heart is normal in size. Right IJ venous catheter terminates at the cavoatrial junction.  IMPRESSION: No evidence of acute cardiopulmonary disease. Electronically Signed   By: Julian Hy M.D.   On: 03/11/2019 06:08     Medications:     Scheduled Medications: . aspirin EC  81 mg Oral Daily  . atorvastatin  40 mg Oral QHS  . cholecalciferol  2,000 Units Oral Daily  . clopidogrel  75 mg Oral Daily  . digoxin  0.125 mg Oral Daily  . docusate sodium  100 mg Oral BID  . gabapentin  600 mg Oral TID  . heparin injection (subcutaneous)  5,000 Units Subcutaneous Q8H  . insulin aspart  0-15 Units Subcutaneous TID WC  . insulin aspart  0-5 Units Subcutaneous QHS  . insulin aspart  7 Units Subcutaneous TID WC  . insulin glargine  60 Units Subcutaneous BID  . metoCLOPramide  10 mg Oral QHS  . midodrine  5 mg Oral TID WC  . pantoprazole  40 mg Oral Daily  . potassium chloride  40 mEq Oral TID  . sodium chloride flush  3 mL Intravenous Q12H  . spironolactone  12.5 mg Oral Daily  . torsemide  40 mg Oral BID  . traMADol  100 mg Oral TID    Infusions: . sodium chloride 10 mL/hr at 03/11/19 0756  . sodium chloride 10 mL/hr at 03/11/19 0757  . ceFEPime (MAXIPIME) IV Stopped (03/10/19 1154)  . norepinephrine (LEVOPHED) Adult infusion 1 mcg/min (03/11/19 0755)  . potassium chloride      PRN Medications: sodium chloride, acetaminophen, bisacodyl, clonazepam, clonazePAM, diazepam, diphenhydrAMINE, diphenoxylate-atropine, HYDROmorphone (DILAUDID) injection, magnesium citrate, metoCLOPramide **OR** metoCLOPramide (REGLAN) injection, ondansetron **OR** ondansetron (ZOFRAN) IV, oxyCODONE, oxyCODONE, polyethylene glycol, sodium chloride flush, zolpidem    Patient Profile   [name] is a 68 y.o. female with h/o CAD s/p LAD stent, chronic systolic CHF, DM2, neuropathy, gastroparesis, anxiety, panic attacks, and breast cancer s/p right mastectomy.   Suffered an NSTEMI in setting of R BKA. Troponin peaked at 23. Dr. Gwenlyn Found assessed and recommended no intervention at this  time with relative lack of symptoms. CHF team consulted for med titration.   Assessment/Plan    1. CAD: Patient has history of prior anterior MI with LAD stent. She developed hypotension but no significant ECG changes or chest pain post-op 3/9. Troponin up to 29. Suspect peri-op NSTEMI. LHC on 3/13 showed 95% stenosis ostial/proximal small-moderate ramus and suspected ostial diagonal occlusion.  One of these vessels may have been culprit lesion, no obstructive disease in the major coronaries, no interventional target (ramus too small for stent placement).  - Continue ASA and statin.  - Continue Plavix 75 mg daily.   2. Shock: Suspect mixed septic/cardiogenic.  Septic shock with osteomyelitis, WBCs trending down and afebrile, cultures negative.  - Currently on cefepime.   - NE ongoing at 1, should be able to stop today.  3. Acute on chronic systolic CHF: Ischemic cardiomyopathy. Apparently, baseline EF is in the 25-30% range. Echo this admission with EF 20-25%. Volume status much better this morning with CVP 8 on Lasix gtt.  Good CI on RHC yesterday.   - Stop NE today, hopefully can titrate off midodrine soon.  - Continue digoxin 0.125 daily.  - Transition to torsemide 40 mg bid.  - Add spironolactone 12.5 daily.  4. AKI: Creatinine up to 1.8, now down to 1.29. Suspect prerenal in setting of septic shock.  5. Anemia: Hgb 7.8 => 8.6 => 8.8 but no overt bleeding.  FOBT+.  - Send Fe studies.   Loralie Champagne 03/11/2019 9:05 AM

## 2019-03-11 NOTE — Progress Notes (Addendum)
Patient ID: [name], female   DOB: 04-Jun-1951, 68 y.o.   MRN: [id number]     Subjective:  Patient reports pain as mild.  Patient in bed and in no acute distress.  Denies any CP   Objective:   VITALS:   Vitals:   03/11/19 0400 03/11/19 0500 03/11/19 0600 03/11/19 0700  BP: (!) 103/57 (!) 110/55 (!) 102/56 (!) 101/54  Pulse: 78 84 80 77  Resp: 16 17 15 16   Temp:      TempSrc:      SpO2: 95% 90% 91% 95%  Weight:  93.2 kg    Height:        ABD soft Sensation intact distally Dorsiflexion/Plantar flexion intact Incision: dressing C/D/I and no drainage   Lab Results  Component Value Date   WBC 12.4 (H) 03/11/2019   HGB 8.8 (L) 03/11/2019   HCT 27.6 (L) 03/11/2019   MCV 84.7 03/11/2019   PLT 350 03/11/2019   BMET    Component Value Date/Time   NA 136 03/11/2019 0625   NA 139 01/03/2013 1311   NA 138 03/12/2009 1059   K 3.5 03/11/2019 0625   K 3.7 01/03/2013 1311   K 4.6 03/12/2009 1059   CL 95 (L) 03/11/2019 0625   CL 106 01/03/2013 1311   CL 96 (L) 03/12/2009 1059   CO2 29 03/11/2019 0625   CO2 22 01/03/2013 1311   CO2 28 03/12/2009 1059   GLUCOSE 72 03/11/2019 0625   GLUCOSE 92 01/03/2013 1311   GLUCOSE 280 (H) 03/12/2009 1059   BUN 27 (H) 03/11/2019 0625   BUN 13 01/03/2013 1311   BUN 13 03/12/2009 1059   CREATININE 1.29 (H) 03/11/2019 0625   CREATININE 1.17 01/03/2013 1311   CREATININE 1.3 (H) 03/12/2009 1059   CALCIUM 9.0 03/11/2019 0625   CALCIUM 9.0 01/03/2013 1311   CALCIUM 9.5 03/12/2009 1059   GFRNONAA 42 (L) 03/11/2019 0625   GFRNONAA 50 (L) 01/03/2013 1311   GFRAA 49 (L) 03/11/2019 0625   GFRAA 58 (L) 01/03/2013 1311     Assessment/Plan: 1 Day Post-Op   Principal Problem:   Below-knee amputation of right lower extremity (HCC) Active Problems:   S/P below knee amputation, right (HCC)   Hypoxemia   Shock circulatory (HCC)   Acute pain   Acute encephalopathy   Non-STEMI (non-ST elevated myocardial infarction) ([place])  Encounter for central line placement   Advance diet Up with therapy Continue plan per critical care Spl[place] at all times Plan for PT with CIR   Lunette Stands 03/11/2019, 9:53 AM  Discussed, seen and agree with above.  Plan dressing change next week.   Marchia Bond, MD Cell [phone]

## 2019-03-11 NOTE — Progress Notes (Signed)
NAME:  [name], MRN:  [id number], DOB:  [birth year], LOS: 5 ADMISSION DATE:  03/06/2019, CONSULTATION DATE:  03/06/2019 REFERRING MD:  Dr. Mardelle Matte, CHIEF COMPLAINT:  Hypotension  Brief History   1 yoF with systolic HF and severe diabetes s/p initial fall with R ankle fracture 2/19.  F/u w/ ortho and scheduled for ORIF but found to have significant damage to splint with bleeding and open tibia fracture with purulent drainage and necrotic tissue.  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  EBL ~700.  Extubated post op but was hypotensive requiring pressors.  Labs pending.    Past Medical History  Systolic heart failure (05/736 EF 25-30%), CAD, DM, diabetic neuropathy, panic attacks, prior breast surgery s/p R mastectomy, gastroparresis  Significant Hospital Events   2/19 initial R ankle fracture 3/12 still pressor dependent. Heart failure team evaluated in consult. CVL placed.   Consults:   Procedures:  3/9 s/p R BKA 3/9 thru 3/11: still pressor dependent. Seen by HF team  Significant Diagnostic Tests:   Micro Data:  3/9 BC x 2 >>NTD  Antimicrobials:  3/9 ancef preop 3/9 vanc >>3/12 3/9 cefepime >>  Interim history/subjective:  No events overnight, remains on pressors  Objective   Blood pressure (!) 101/54, pulse 77, temperature 97.9 F (36.6 C), temperature source Oral, resp. rate 16, height 5\' 9"  (1.753 m), weight 93.2 kg, SpO2 95 %. CVP:  [4 mmHg-10 mmHg] 4 mmHg      Intake/Output Summary (Last 24 hours) at 03/11/2019 0843 Last data filed at 03/11/2019 0600 Gross per 24 hour  Intake 793.31 ml  Output 4290 ml  Net -3496.69 ml   Filed Weights   03/07/19 0530 03/09/19 0500 03/11/19 0500  Weight: 98.5 kg 99.7 kg 93.2 kg   Examination:  General: Well appearing, NAD HENT: [place]/AT, PERRL, EOM-I and MMM Pulm: CTA bilaterally Card: RRR, Nl S1/S2 and -M/R/G Abd: Soft, NT, ND and +BS Ext warm, dependent edema scattered areas of ecchymosis left BKA wrap intact Gu: Clear,  yellow Neuro: alert and interactive, moving all ext to command  I reviewed CXR myself, ETT is in a good position  Resolved Hospital Problem list    Assessment & Plan:    Open right ankle fracture s/p R BKA Plan Post op per ortho D/C fentanyl  Shock - septic vs hemorraghic, r/o cardiac process with CAD/ HFrPF hx -seen by HF team in consult today (3/11).  Plan F/U Coox and CVP Milrinone Levophed as ordered D/C lasix Hold metolazone, entresto  No Ace I or ARB given AKI No BB w/ shock   CAD, HLD Plan Tele monitoring Cath on Monday Advanced heart failure team  AKI. Scr was rising; now better Plan  Trend chem Maximize CO Strict I&O  DM Plan:  SSI  CBG q4 Adding basal dosing   Peripheral neuropathy Plan Was on neurontin, flexeril and diazepam previously. Holding these still  GERD, gastroparesis plan Cont PPI and resume bentyl   Anemia of chronic disease Plan Trend cbc Transfuse for hgb <7  Continue to hold in the ICU for pressors  The patient is critically ill with multiple organ systems failure and requires high complexity decision making for assessment and support, frequent evaluation and titration of therapies, application of advanced monitoring technologies and extensive interpretation of multiple databases.   Critical Care Time devoted to patient care services described in this note is  32  Minutes. This time reflects time of care of this signee Dr Jennet Maduro. This critical  care time does not reflect procedure time, or teaching time or supervisory time of PA/NP/Med student/Med Resident etc but could involve care discussion time.  Rush Farmer, M.D. [facility] Pulmonary/Critical Care Medicine. Pager: [phone]. After hours pager: [phone].

## 2019-03-12 LAB — BASIC METABOLIC PANEL
Anion gap: 13 (ref 5–15)
BUN: 30 mg/dL — ABNORMAL HIGH (ref 8–23)
CO2: 27 mmol/L (ref 22–32)
Calcium: 8.7 mg/dL — ABNORMAL LOW (ref 8.9–10.3)
Chloride: 92 mmol/L — ABNORMAL LOW (ref 98–111)
Creatinine, Ser: 1.45 mg/dL — ABNORMAL HIGH (ref 0.44–1.00)
GFR calc Af Amer: 42 mL/min — ABNORMAL LOW (ref >60)
GFR calc non Af Amer: 37 mL/min — ABNORMAL LOW (ref >60)
Glucose, Bld: 150 mg/dL — ABNORMAL HIGH (ref 70–99)
Potassium: 4.8 mmol/L (ref 3.5–5.1)
Sodium: 132 mmol/L — ABNORMAL LOW (ref 135–145)

## 2019-03-12 LAB — COOXEMETRY PANEL
Carboxyhemoglobin: 1.7 % — ABNORMAL HIGH (ref 0.5–1.5)
Methemoglobin: 1.8 % — ABNORMAL HIGH (ref 0.0–1.5)
O2 Saturation: 78.5 %
Total hemoglobin: 8.3 g/dL — ABNORMAL LOW (ref 12.0–16.0)

## 2019-03-12 LAB — GLUCOSE, CAPILLARY
GLUCOSE-CAPILLARY: 179 mg/dL — AB (ref 70–99)
Glucose-Capillary: 160 mg/dL — ABNORMAL HIGH (ref 70–99)
Glucose-Capillary: 156 mg/dL — ABNORMAL HIGH (ref 70–99)
Glucose-Capillary: 168 mg/dL — ABNORMAL HIGH (ref 70–99)

## 2019-03-12 LAB — CBC
HCT: 26.6 % — ABNORMAL LOW (ref 36.0–46.0)
Hemoglobin: 8.2 g/dL — ABNORMAL LOW (ref 12.0–15.0)
MCH: 26.8 pg (ref 26.0–34.0)
MCHC: 30.8 g/dL (ref 30.0–36.0)
MCV: 86.9 fL (ref 80.0–100.0)
Platelets: 328 10*3/uL (ref 150–400)
RBC: 3.06 MIL/uL — ABNORMAL LOW (ref 3.87–5.11)
RDW: 17.2 % — AB (ref 11.5–15.5)
WBC: 11.4 10*3/uL — ABNORMAL HIGH (ref 4.0–10.5)
nRBC: 0.3 % — ABNORMAL HIGH (ref 0.0–0.2)

## 2019-03-12 LAB — PHOSPHORUS: Phosphorus: 3.8 mg/dL (ref 2.5–4.6)

## 2019-03-12 LAB — MAGNESIUM: Magnesium: 1.9 mg/dL (ref 1.7–2.4)

## 2019-03-12 MED ORDER — POLYETHYLENE GLYCOL 3350 17 G PO PACK
17.0000 g | PACK | Freq: Two times a day (BID) | ORAL | Status: DC
  Administered 2019-03-12: 17 g via ORAL
  Filled 2019-03-12: qty 1

## 2019-03-12 MED ORDER — SORBITOL 70 % SOLN
960.0000 mL | TOPICAL_OIL | Freq: Once | ORAL | Status: DC | PRN
  Filled 2019-03-12: qty 473

## 2019-03-12 MED ORDER — BISACODYL 10 MG RE SUPP: 10.0000 mg | Freq: Every day | RECTAL | Status: DC | PRN

## 2019-03-12 MED ORDER — FLEET ENEMA 7-19 GM/118ML RE ENEM
1.0000 | ENEMA | Freq: Every day | RECTAL | Status: DC | PRN
  Filled 2019-03-12: qty 1

## 2019-03-12 MED ORDER — TORSEMIDE 20 MG PO TABS
40.0000 mg | ORAL_TABLET | Freq: Every day | ORAL | Status: DC
  Administered 2019-03-13: 40 mg via ORAL
  Filled 2019-03-12: qty 2

## 2019-03-12 MED ORDER — SORBITOL 70 % SOLN
30.0000 mL | Freq: Every day | Status: DC | PRN
  Filled 2019-03-12: qty 60

## 2019-03-12 MED ORDER — ACETAMINOPHEN 325 MG PO TABS
650.0000 mg | ORAL_TABLET | Freq: Four times a day (QID) | ORAL | Status: DC | PRN
  Administered 2019-03-13: 650 mg via ORAL
  Filled 2019-03-12: qty 2

## 2019-03-12 MED ORDER — SODIUM CHLORIDE 0.9 % IV SOLN
510.0000 mg | Freq: Once | INTRAVENOUS | Status: AC
  Administered 2019-03-12: 510 mg via INTRAVENOUS
  Filled 2019-03-12: qty 17

## 2019-03-12 NOTE — Progress Notes (Signed)
[facility] TEAM 1 - Russell  WUJ:[id number] DOB: 25-Jan-1951 DOA: 03/06/2019 PCP: Perrin Maltese, MD    Brief Narrative:  89AO w/ systolic CHF and severe diabetes s/p a fall > R ankle fracture 2/19. She was being followed by Ortho and scheduled for an ORIF, but was subsequently found to have significant damage to her splint, and was ultimately found to have an open tibia fracture with purulent drainage and necrotic tissue. She required a R BKA, and was extubated post op, but was hypotensive requiring pressors.   Significant Events: 2/19 initial R ankle fracture 3/9 admit - R BKA 3/12 pressor dependent - CVL placed 3/13 LHC  3/15 TRH assumed care    Subjective: The patient is resting comfortably in bed.  She complains of severe constipation.  She denies nausea vomiting chest pain or shortness of breath.  Assessment & Plan:  Open right ankle fracture w/ osteomyelitis s/p R BKA Care per Ortho -appears ready for rehabilitation  Septic (osteomyelitis) v/s Cardiogenic shock Has required milrinone and levophed -now off both and appears to be stabilizing -shock physiology resolved  CAD hx of prior anterior MI with LAD stent - Troponin up to 29 this admit > suspect peri-op NSTEMI - LHC 3/13: 95% stenosis ostial/proximal small-moderate ramus (too small for stent placement) and suspected ostial diagonal occlusion, w/ no obstructive disease in the major coronaries - continue ASA, Plavix, and statin  Acute on Chronic Systolic CHF  Ischemic cardiomyopathy - EF 20-25% - CHF Team managing - midodrine to stop today - no evidence of significant volume overload on exam  AKI Creatinine is trending up - follow trend closely   HLD Continue Lipitor   DM CBG reasonably controlled   Peripheral neuropathy  GERD, gastroparesis  Anemia of chronic disease No evidence of blood loss - follow Hgb trend   DVT prophylaxis: SQ heparin  Code Status: FULL CODE Family  Communication: no family present at time of exam  Disposition Plan: stable for transfer to SDU  Consultants:  Ortho PCCM CHF Team   Antimicrobials:  3/9 ancef preop 3/9 vanc > 3/12 3/9 cefepime >  Objective: Blood pressure 105/61, pulse 69, temperature 98 F (36.7 C), temperature source Oral, resp. rate 16, height 5\' 9"  (1.753 m), weight 93.1 kg, SpO2 97 %.  Intake/Output Summary (Last 24 hours) at 03/12/2019 1006 Last data filed at 03/12/2019 0600 Gross per 24 hour  Intake 544.5 ml  Output 2250 ml  Net -1705.5 ml   Filed Weights   03/09/19 0500 03/11/19 0500 03/12/19 0500  Weight: 99.7 kg 93.2 kg 93.1 kg    Examination: General: No acute respiratory distress Lungs: Clear to auscultation bilaterally without wheezes or crackles Cardiovascular: Regular rate and rhythm without murmur  Abdomen: Nontender, nondistended, soft, bowel sounds positive, no rebound, no ascites, no appreciable mass Extremities: No significant cyanosis, clubbing, or edema bilateral lower extremities - R LE dressed and dry   CBC: Recent Labs  Lab 03/06/19 1904  03/10/19 0405 03/10/19 1543 03/11/19 0625 03/12/19 0351  WBC 20.5*   < > 16.6*  --  12.4* 11.4*  NEUTROABS 18.2*  --   --   --   --   --   HGB 6.4*   < > 8.6* 8.5* 8.8* 8.2*  HCT 21.6*   < > 27.4* 25.0* 27.6* 26.6*  MCV 84.0   < > 85.4  --  84.7 86.9  PLT 364   < > 363  --  350 328   < > = values in this interval not displayed.   Basic Metabolic Panel: Recent Labs  Lab 03/10/19 0405 03/10/19 1543 03/11/19 0625 03/12/19 0351  NA 135 134* 136 132*  K 3.3* 4.6 3.5 4.8  CL 97*  --  95* 92*  CO2 27  --  29 27  GLUCOSE 135*  --  72 150*  BUN 33*  --  27* 30*  CREATININE 1.42*  --  1.29* 1.45*  CALCIUM 8.8*  --  9.0 8.7*  MG 1.6*  --  2.0 1.9  PHOS 2.1*  --  3.9 3.8   GFR: Estimated Creatinine Clearance: 44.5 mL/min (A) (by C-G formula based on SCr of 1.45 mg/dL (H)).  Liver Function Tests: No results for input(s): AST, ALT,  ALKPHOS, BILITOT, PROT, ALBUMIN in the last 168 hours. No results for input(s): LIPASE, AMYLASE in the last 168 hours. No results for input(s): AMMONIA in the last 168 hours.  Coagulation Profile: No results for input(s): INR, PROTIME in the last 168 hours.  Cardiac Enzymes: Recent Labs  Lab 03/06/19 1904 03/07/19 0033 03/07/19 0616 03/07/19 1114 03/08/19 0946  TROPONINI 0.03* 2.08* 23.21* 29.49* 20.09*    HbA1C: Hgb A1c MFr Bld  Date/Time Value Ref Range Status  02/22/2019 11:51 AM 7.8 (H) 4.8 - 5.6 % Final    Comment:    (NOTE) Pre diabetes:          5.7%-6.4% Diabetes:              >6.4% Glycemic control for   <7.0% adults with diabetes     CBG: Recent Labs  Lab 03/11/19 0725 03/11/19 1140 03/11/19 1555 03/11/19 2205 03/12/19 0743  GLUCAP 74 130* 134* 113* 160*    Recent Results (from the past 240 hour(s))  Culture, blood (routine x 2)     Status: None   Collection Time: 03/06/19  7:04 PM  Result Value Ref Range Status   Specimen Description BLOOD LEFT FOOT  Final   Special Requests   Final    BOTTLES DRAWN AEROBIC AND ANAEROBIC Blood Culture adequate volume   Culture   Final    NO GROWTH 5 DAYS Performed at [facility], [place] [address], [place], [place] [address]    Report Status 03/11/2019 FINAL  Final  MRSA PCR Screening     Status: None   Collection Time: 03/06/19  8:19 PM  Result Value Ref Range Status   MRSA by PCR NEGATIVE NEGATIVE Final    Comment:        The GeneXpert MRSA Assay (FDA approved for NASAL specimens only), is one component of a comprehensive MRSA colonization surveillance program. It is not intended to diagnose MRSA infection nor to guide or monitor treatment for MRSA infections. Performed at [facility], [place] [address], [place], [place] [address]   Culture, blood (routine x 2)     Status: None   Collection Time: 03/06/19  9:54 PM  Result Value Ref Range Status   Specimen Description BLOOD LEFT HAND  Final    Special Requests   Final    BOTTLES DRAWN AEROBIC ONLY Blood Culture results may not be optimal due to an inadequate volume of blood received in culture bottles   Culture   Final    NO GROWTH 5 DAYS Performed at [facility], [place] [address] [place], [place] [address]    Report Status 03/11/2019 FINAL  Final     Scheduled  Meds: . aspirin EC  81 mg Oral Daily  . atorvastatin  40 mg Oral QHS  . cholecalciferol  2,000 Units Oral Daily  . clopidogrel  75 mg Oral Daily  . digoxin  0.125 mg Oral Daily  . docusate sodium  100 mg Oral BID  . gabapentin  600 mg Oral TID  . heparin injection (subcutaneous)  5,000 Units Subcutaneous Q8H  . insulin aspart  0-15 Units Subcutaneous TID WC  . insulin aspart  0-5 Units Subcutaneous QHS  . insulin aspart  7 Units Subcutaneous TID WC  . insulin glargine  60 Units Subcutaneous BID  . metoCLOPramide  10 mg Oral QHS  . pantoprazole  40 mg Oral Daily  . sodium chloride flush  3 mL Intravenous Q12H  . spironolactone  12.5 mg Oral Daily  . [START ON 03/13/2019] torsemide  40 mg Oral Daily  . traMADol  100 mg Oral TID     LOS: 6 days   Cherene Altes, MD [facility] Office  [phone] Pager - Text Page per Amion  If 7PM-7AM, please contact night-coverage per Amion 03/12/2019, 10:06 AM

## 2019-03-12 NOTE — Progress Notes (Signed)
Patient ID: [name], female   DOB: 05/22/1951, 68 y.o.   MRN: [id number]     Advanced Heart Failure Rounding Note  PCP-Cardiologist: No primary care provider on file.   Subjective:    Now off norepinephrine, SBP 90s-100s.  Creatinine up a bit to 1.45. CVP 7-8.  Co-ox 78%.   No chest pain or dyspnea this morning.   LHC/RHC (3/14): Hemodynamics:  LV 101/39 Ao 92/50 RA 14 CO (Fick) 6.28 CI (Fick) 2.92 Diagnostic  Dominance: Right  Left Main  Minimal disease.  Left Anterior Descending  Large LAD wrapping around apex. There is a patent proximal LAD stent. Diffuse luminal irregularities. There was probably a diagonal originating from the mid LAD that was occluded [place].  Ramus Intermedius  Small to moderate ramus with 95% ostial/proximal stenosis.  Left Circumflex  50% stenosis mid LCx and 50% stenosis distal LCx. 50% stenosis in proximal small-moderate OM1.  Right Coronary Artery  50% stenosis distal RCA/ostial PLV.     Objective:   Weight Range: 93.1 kg Body mass index is 30.31 kg/m.   Vital Signs:   Temp:  [97.5 F (36.4 C)-98.4 F (36.9 C)] 98 F (36.7 C) (03/15 0800) Pulse Rate:  [62-85] 69 (03/15 0800) Resp:  [13-25] 16 (03/15 0800) BP: (89-113)/(50-63) 105/61 (03/15 0800) SpO2:  [93 %-100 %] 97 % (03/15 0800) Weight:  [93.1 kg] 93.1 kg (03/15 0500) Last BM Date: 03/11/19(per report)  Weight change: Filed Weights   03/09/19 0500 03/11/19 0500 03/12/19 0500  Weight: 99.7 kg 93.2 kg 93.1 kg    Intake/Output:   Intake/Output Summary (Last 24 hours) at 03/12/2019 0902 Last data filed at 03/12/2019 0600 Gross per 24 hour  Intake 701.77 ml  Output 2250 ml  Net -1548.23 ml    Physical Exam    General: NAD Neck: No JVD, no thyromegaly or thyroid nodule.  Lungs: Clear to auscultation bilaterally with normal respiratory effort. CV: Nondisplaced PMI.  Heart regular S1/S2, no S3/S4, no murmur.  No peripheral edema.   Abdomen: Soft,  nontender, no hepatosplenomegaly, no distention.  Skin: Intact without lesions or rashes.  Neurologic: Alert and oriented x 3.  Psych: Normal affect. Extremities: No clubbing or cyanosis. Right BKA.  HEENT: Normal.    Telemetry   NSR 80s, personally reviewed.    EKG   No new tracings.    Labs    CBC Recent Labs    03/11/19 0625 03/12/19 0351  WBC 12.4* 11.4*  HGB 8.8* 8.2*  HCT 27.6* 26.6*  MCV 84.7 86.9  PLT 350 938   Basic Metabolic Panel Recent Labs    03/11/19 0625 03/12/19 0351  NA 136 132*  K 3.5 4.8  CL 95* 92*  CO2 29 27  GLUCOSE 72 150*  BUN 27* 30*  CREATININE 1.29* 1.45*  CALCIUM 9.0 8.7*  MG 2.0 1.9  PHOS 3.9 3.8   Liver Function Tests No results for input(s): AST, ALT, ALKPHOS, BILITOT, PROT, ALBUMIN in the last 72 hours. No results for input(s): LIPASE, AMYLASE in the last 72 hours. Cardiac Enzymes No results for input(s): CKTOTAL, CKMB, CKMBINDEX, TROPONINI in the last 72 hours.  BNP: BNP (last 3 results) Recent Labs    02/09/19 1322  BNP 706.0*    ProBNP (last 3 results) No results for input(s): PROBNP in the last 8760 hours.   D-Dimer No results for input(s): DDIMER in the last 72 hours. Hemoglobin A1C No results for input(s): HGBA1C in the last 72 hours. Fasting Lipid  Panel No results for input(s): CHOL, HDL, LDLCALC, TRIG, CHOLHDL, LDLDIRECT in the last 72 hours. Thyroid Function Tests No results for input(s): TSH, T4TOTAL, T3FREE, THYROIDAB in the last 72 hours.  Invalid input(s): FREET3  Other results:   Imaging    No results found.   Medications:     Scheduled Medications: . aspirin EC  81 mg Oral Daily  . atorvastatin  40 mg Oral QHS  . cholecalciferol  2,000 Units Oral Daily  . clopidogrel  75 mg Oral Daily  . digoxin  0.125 mg Oral Daily  . docusate sodium  100 mg Oral BID  . gabapentin  600 mg Oral TID  . heparin injection (subcutaneous)  5,000 Units Subcutaneous Q8H  . insulin aspart  0-15 Units  Subcutaneous TID WC  . insulin aspart  0-5 Units Subcutaneous QHS  . insulin aspart  7 Units Subcutaneous TID WC  . insulin glargine  60 Units Subcutaneous BID  . metoCLOPramide  10 mg Oral QHS  . pantoprazole  40 mg Oral Daily  . sodium chloride flush  3 mL Intravenous Q12H  . spironolactone  12.5 mg Oral Daily  . [START ON 03/13/2019] torsemide  40 mg Oral Daily  . traMADol  100 mg Oral TID    Infusions: . sodium chloride Stopped (03/11/19 1007)  . sodium chloride Stopped (03/11/19 0914)  . ceFEPime (MAXIPIME) IV Stopped (03/11/19 1120)  . norepinephrine (LEVOPHED) Adult infusion Stopped (03/11/19 1050)    PRN Medications: sodium chloride, acetaminophen, bisacodyl, clonazepam, clonazePAM, diazepam, diphenhydrAMINE, diphenoxylate-atropine, HYDROmorphone (DILAUDID) injection, magnesium citrate, metoCLOPramide **OR** metoCLOPramide (REGLAN) injection, ondansetron **OR** ondansetron (ZOFRAN) IV, oxyCODONE, oxyCODONE, polyethylene glycol, sodium chloride flush, zolpidem    Patient Profile   [name] is a 68 y.o. female with h/o CAD s/p LAD stent, chronic systolic CHF, DM2, neuropathy, gastroparesis, anxiety, panic attacks, and breast cancer s/p right mastectomy.   Suffered an NSTEMI in setting of R BKA. Troponin peaked at 23. Dr. Gwenlyn Found assessed and recommended no intervention at this time with relative lack of symptoms. CHF team consulted for med titration.   Assessment/Plan    1. CAD: Patient has history of prior anterior MI with LAD stent. She developed hypotension but no significant ECG changes or chest pain post-op 3/9. Troponin up to 29. Suspect peri-op NSTEMI. LHC on 3/13 showed 95% stenosis ostial/proximal small-moderate ramus and suspected ostial diagonal occlusion.  One of these vessels may have been culprit lesion, no obstructive disease in the major coronaries, no interventional target (ramus too small for stent placement).  - Continue ASA and statin.  -  Continue Plavix 75 mg daily.   2. Shock: Suspect mixed septic/cardiogenic. Septic shock with osteomyelitis, WBCs trending down and afebrile, cultures negative. Now off pressors.  - Currently on cefepime.  3. Acute on chronic systolic CHF: Ischemic cardiomyopathy. Apparently, baseline EF is in the 25-30% range. Echo this admission with EF 20-25%. Volume status much better this morning with CVP 7-8 with co-ox 78%. BP stable off norepinephrine.  - Will try her off midodrine today.   - Continue digoxin 0.125 daily.  - Mild rise in creatinine with normal CVP, decrease torsemide to 40 daily.  - Continue spironolactone 12.5 daily.  4. AKI: Creatinine up to 1.8, now 1.45. Suspect prerenal in setting of septic shock.  5. Anemia: Hgb 7.8 => 8.6 => 8.8 => 8.2 but no overt bleeding.  FOBT+.  - Fe deficient, will give feraheme.   6. Osteomyelitis: s/p right BKA.   Emylie Amster  Vignesh Willert 03/12/2019 9:02 AM

## 2019-03-12 NOTE — Progress Notes (Addendum)
Patient ID: SHEA SWALLEY, female   DOB: 06-06-1951, 68 y.o.   MRN: [id number]     Subjective:  Patient reports pain as mild to moderate.  Patient in bed and in no acute distress.  Objective:   VITALS:   Vitals:   03/12/19 0500 03/12/19 0600 03/12/19 0700 03/12/19 0800  BP: 93/61  (!) 100/58 105/61  Pulse: 74 73 63 69  Resp: 17 13 16 16   Temp:    98 F (36.7 C)  TempSrc:    Oral  SpO2: 94% 99% 98% 97%  Weight: 93.1 kg     Height:        ABD soft Sensation intact distally Dorsiflexion/Plantar flexion intact Incision: dressing C/D/I and no drainage   Lab Results  Component Value Date   WBC 11.4 (H) 03/12/2019   HGB 8.2 (L) 03/12/2019   HCT 26.6 (L) 03/12/2019   MCV 86.9 03/12/2019   PLT 328 03/12/2019   BMET    Component Value Date/Time   NA 132 (L) 03/12/2019 0351   NA 139 01/03/2013 1311   NA 138 03/12/2009 1059   K 4.8 03/12/2019 0351   K 3.7 01/03/2013 1311   K 4.6 03/12/2009 1059   CL 92 (L) 03/12/2019 0351   CL 106 01/03/2013 1311   CL 96 (L) 03/12/2009 1059   CO2 27 03/12/2019 0351   CO2 22 01/03/2013 1311   CO2 28 03/12/2009 1059   GLUCOSE 150 (H) 03/12/2019 0351   GLUCOSE 92 01/03/2013 1311   GLUCOSE 280 (H) 03/12/2009 1059   BUN 30 (H) 03/12/2019 0351   BUN 13 01/03/2013 1311   BUN 13 03/12/2009 1059   CREATININE 1.45 (H) 03/12/2019 0351   CREATININE 1.17 01/03/2013 1311   CREATININE 1.3 (H) 03/12/2009 1059   CALCIUM 8.7 (L) 03/12/2019 0351   CALCIUM 9.0 01/03/2013 1311   CALCIUM 9.5 03/12/2009 1059   GFRNONAA 37 (L) 03/12/2019 0351   GFRNONAA 50 (L) 01/03/2013 1311   GFRAA 42 (L) 03/12/2019 0351   GFRAA 58 (L) 01/03/2013 1311     Assessment/Plan: 2 Days Post-Op   Principal Problem:   Below-knee amputation of right lower extremity (HCC) Active Problems:   S/P below knee amputation, right (HCC)   Hypoxemia   Shock circulatory (HCC)   Acute pain   Acute encephalopathy   Non-STEMI (non-ST elevated myocardial infarction)  ([place])   Encounter for central line placement   Advance diet Up with therapy Continue plan per medicine Continue splint  NWB right lower ext   Lunette Stands 03/12/2019, 10:55 AM  discussed and agree with above.  To stepdown today.  Possible CIR.   Marchia Bond, MD Cell [phone]

## 2019-03-13 ENCOUNTER — Inpatient Hospital Stay (HOSPITAL_COMMUNITY)
Admission: RE | Admit: 2019-03-13 | Discharge: 2019-03-30 | DRG: 559 | Disposition: A | Discharge status: Home Health | Payer: Medicare Other | Source: Intra-hospital | Attending: Physical Medicine & Rehabilitation | Admitting: Physical Medicine & Rehabilitation

## 2019-03-13 ENCOUNTER — Encounter (HOSPITAL_COMMUNITY): Payer: Self-pay | Admitting: Cardiology

## 2019-03-13 ENCOUNTER — Other Ambulatory Visit: Payer: Self-pay

## 2019-03-13 DIAGNOSIS — Z9981 Dependence on supplemental oxygen: Secondary | ICD-10-CM

## 2019-03-13 DIAGNOSIS — F411 Generalized anxiety disorder: Secondary | ICD-10-CM

## 2019-03-13 DIAGNOSIS — E1143 Type 2 diabetes mellitus with diabetic autonomic (poly)neuropathy: Secondary | ICD-10-CM | POA: Diagnosis present

## 2019-03-13 DIAGNOSIS — K3184 Gastroparesis: Secondary | ICD-10-CM | POA: Diagnosis present

## 2019-03-13 DIAGNOSIS — E1142 Type 2 diabetes mellitus with diabetic polyneuropathy: Secondary | ICD-10-CM

## 2019-03-13 DIAGNOSIS — Z794 Long term (current) use of insulin: Secondary | ICD-10-CM

## 2019-03-13 DIAGNOSIS — D62 Acute posthemorrhagic anemia: Secondary | ICD-10-CM

## 2019-03-13 DIAGNOSIS — Z9011 Acquired absence of right breast and nipple: Secondary | ICD-10-CM

## 2019-03-13 DIAGNOSIS — E162 Hypoglycemia, unspecified: Secondary | ICD-10-CM

## 2019-03-13 DIAGNOSIS — I5023 Acute on chronic systolic (congestive) heart failure: Secondary | ICD-10-CM

## 2019-03-13 DIAGNOSIS — E785 Hyperlipidemia, unspecified: Secondary | ICD-10-CM | POA: Diagnosis present

## 2019-03-13 DIAGNOSIS — E876 Hypokalemia: Secondary | ICD-10-CM

## 2019-03-13 DIAGNOSIS — E669 Obesity, unspecified: Secondary | ICD-10-CM

## 2019-03-13 DIAGNOSIS — F410 Panic disorder [episodic paroxysmal anxiety] without agoraphobia: Secondary | ICD-10-CM | POA: Diagnosis present

## 2019-03-13 DIAGNOSIS — I959 Hypotension, unspecified: Secondary | ICD-10-CM

## 2019-03-13 DIAGNOSIS — G8918 Other acute postprocedural pain: Secondary | ICD-10-CM

## 2019-03-13 DIAGNOSIS — Z823 Family history of stroke: Secondary | ICD-10-CM

## 2019-03-13 DIAGNOSIS — K5903 Drug induced constipation: Secondary | ICD-10-CM

## 2019-03-13 DIAGNOSIS — N179 Acute kidney failure, unspecified: Secondary | ICD-10-CM

## 2019-03-13 DIAGNOSIS — E1169 Type 2 diabetes mellitus with other specified complication: Secondary | ICD-10-CM

## 2019-03-13 DIAGNOSIS — E11649 Type 2 diabetes mellitus with hypoglycemia without coma: Secondary | ICD-10-CM | POA: Diagnosis present

## 2019-03-13 DIAGNOSIS — E119 Type 2 diabetes mellitus without complications: Secondary | ICD-10-CM

## 2019-03-13 DIAGNOSIS — R7309 Other abnormal glucose: Secondary | ICD-10-CM | POA: Diagnosis not present

## 2019-03-13 DIAGNOSIS — Z955 Presence of coronary angioplasty implant and graft: Secondary | ICD-10-CM | POA: Diagnosis not present

## 2019-03-13 DIAGNOSIS — Z4781 Encounter for orthopedic aftercare following surgical amputation: Secondary | ICD-10-CM | POA: Diagnosis present

## 2019-03-13 DIAGNOSIS — I2510 Atherosclerotic heart disease of native coronary artery without angina pectoris: Secondary | ICD-10-CM | POA: Diagnosis present

## 2019-03-13 DIAGNOSIS — I255 Ischemic cardiomyopathy: Secondary | ICD-10-CM | POA: Diagnosis present

## 2019-03-13 DIAGNOSIS — I951 Orthostatic hypotension: Secondary | ICD-10-CM

## 2019-03-13 DIAGNOSIS — Z833 Family history of diabetes mellitus: Secondary | ICD-10-CM | POA: Diagnosis not present

## 2019-03-13 DIAGNOSIS — I252 Old myocardial infarction: Secondary | ICD-10-CM

## 2019-03-13 DIAGNOSIS — E611 Iron deficiency: Secondary | ICD-10-CM | POA: Diagnosis present

## 2019-03-13 DIAGNOSIS — M869 Osteomyelitis, unspecified: Secondary | ICD-10-CM | POA: Diagnosis not present

## 2019-03-13 DIAGNOSIS — Z79899 Other long term (current) drug therapy: Secondary | ICD-10-CM

## 2019-03-13 DIAGNOSIS — I5043 Acute on chronic combined systolic (congestive) and diastolic (congestive) heart failure: Secondary | ICD-10-CM | POA: Diagnosis present

## 2019-03-13 DIAGNOSIS — Z89511 Acquired absence of right leg below knee: Secondary | ICD-10-CM

## 2019-03-13 DIAGNOSIS — Z7982 Long term (current) use of aspirin: Secondary | ICD-10-CM | POA: Diagnosis not present

## 2019-03-13 DIAGNOSIS — Z8249 Family history of ischemic heart disease and other diseases of the circulatory system: Secondary | ICD-10-CM | POA: Diagnosis not present

## 2019-03-13 DIAGNOSIS — E871 Hypo-osmolality and hyponatremia: Secondary | ICD-10-CM | POA: Diagnosis present

## 2019-03-13 DIAGNOSIS — Z7902 Long term (current) use of antithrombotics/antiplatelets: Secondary | ICD-10-CM

## 2019-03-13 DIAGNOSIS — D72829 Elevated white blood cell count, unspecified: Secondary | ICD-10-CM

## 2019-03-13 LAB — COOXEMETRY PANEL
CARBOXYHEMOGLOBIN: 1.5 % (ref 0.5–1.5)
Methemoglobin: 2.1 % — ABNORMAL HIGH (ref 0.0–1.5)
O2 Saturation: 52.6 %
Total hemoglobin: 9.6 g/dL — ABNORMAL LOW (ref 12.0–16.0)

## 2019-03-13 LAB — COMPREHENSIVE METABOLIC PANEL
ALT: 18 U/L (ref 0–44)
AST: 31 U/L (ref 15–41)
Albumin: 2.4 g/dL — ABNORMAL LOW (ref 3.5–5.0)
Alkaline Phosphatase: 90 U/L (ref 38–126)
Anion gap: 10 (ref 5–15)
BUN: 37 mg/dL — ABNORMAL HIGH (ref 8–23)
CO2: 30 mmol/L (ref 22–32)
Calcium: 9.2 mg/dL (ref 8.9–10.3)
Chloride: 93 mmol/L — ABNORMAL LOW (ref 98–111)
Creatinine, Ser: 1.71 mg/dL — ABNORMAL HIGH (ref 0.44–1.00)
GFR calc non Af Amer: 30 mL/min — ABNORMAL LOW (ref >60)
GFR, EST AFRICAN AMERICAN: 35 mL/min — AB (ref >60)
Glucose, Bld: 130 mg/dL — ABNORMAL HIGH (ref 70–99)
Potassium: 3.8 mmol/L (ref 3.5–5.1)
SODIUM: 133 mmol/L — AB (ref 135–145)
Total Bilirubin: 0.9 mg/dL (ref 0.3–1.2)
Total Protein: 6.6 g/dL (ref 6.5–8.1)

## 2019-03-13 LAB — CBC
HCT: 26.3 % — ABNORMAL LOW (ref 36.0–46.0)
Hemoglobin: 7.9 g/dL — ABNORMAL LOW (ref 12.0–15.0)
MCH: 26.2 pg (ref 26.0–34.0)
MCHC: 30 g/dL (ref 30.0–36.0)
MCV: 87.1 fL (ref 80.0–100.0)
PLATELETS: 326 10*3/uL (ref 150–400)
RBC: 3.02 MIL/uL — ABNORMAL LOW (ref 3.87–5.11)
RDW: 16.9 % — ABNORMAL HIGH (ref 11.5–15.5)
WBC: 10.6 10*3/uL — ABNORMAL HIGH (ref 4.0–10.5)
nRBC: 0 % (ref 0.0–0.2)

## 2019-03-13 LAB — GLUCOSE, CAPILLARY
Glucose-Capillary: 103 mg/dL — ABNORMAL HIGH (ref 70–99)
Glucose-Capillary: 113 mg/dL — ABNORMAL HIGH (ref 70–99)
Glucose-Capillary: 114 mg/dL — ABNORMAL HIGH (ref 70–99)
Glucose-Capillary: 120 mg/dL — ABNORMAL HIGH (ref 70–99)
Glucose-Capillary: 160 mg/dL — ABNORMAL HIGH (ref 70–99)
Glucose-Capillary: 82 mg/dL (ref 70–99)

## 2019-03-13 MED ORDER — INSULIN ASPART 100 UNIT/ML ~~LOC~~ SOLN
7.0000 [IU] | Freq: Three times a day (TID) | SUBCUTANEOUS | Status: DC
  Administered 2019-03-13 – 2019-03-30 (×49): 7 [IU] via SUBCUTANEOUS

## 2019-03-13 MED ORDER — VITAMIN D 25 MCG (1000 UNIT) PO TABS
2000.0000 [IU] | ORAL_TABLET | Freq: Every day | ORAL | Status: DC
  Administered 2019-03-14 – 2019-03-30 (×17): 2000 [IU] via ORAL
  Filled 2019-03-13 (×17): qty 2

## 2019-03-13 MED ORDER — BISACODYL 10 MG RE SUPP: 10.0000 mg | Freq: Every day | RECTAL | Status: DC | PRN

## 2019-03-13 MED ORDER — SODIUM CHLORIDE 0.9 % IV SOLN: 510.0000 mg | Freq: Once | INTRAVENOUS | Status: DC

## 2019-03-13 MED ORDER — OXYCODONE HCL 5 MG PO TABS
5.0000 mg | ORAL_TABLET | ORAL | Status: DC | PRN
  Administered 2019-03-13 – 2019-03-16 (×5): 10 mg via ORAL
  Filled 2019-03-13 (×5): qty 2

## 2019-03-13 MED ORDER — CLONAZEPAM 0.25 MG PO TBDP
0.5000 mg | ORAL_TABLET | Freq: Two times a day (BID) | ORAL | Status: DC | PRN
  Administered 2019-03-25 – 2019-03-27 (×4): 0.5 mg via ORAL
  Filled 2019-03-13 (×4): qty 2

## 2019-03-13 MED ORDER — TORSEMIDE 20 MG PO TABS
40.0000 mg | ORAL_TABLET | Freq: Every day | ORAL | Status: DC
  Filled 2019-03-13: qty 2

## 2019-03-13 MED ORDER — SORBITOL 70 % SOLN: 30.0000 mL | Freq: Every day | Status: DC | PRN

## 2019-03-13 MED ORDER — CLONAZEPAM 0.5 MG PO TABS
0.5000 mg | ORAL_TABLET | Freq: Every day | ORAL | Status: DC | PRN
  Administered 2019-03-24 – 2019-03-25 (×3): 0.5 mg via ORAL
  Filled 2019-03-13 (×3): qty 1

## 2019-03-13 MED ORDER — ATORVASTATIN CALCIUM 40 MG PO TABS
40.0000 mg | ORAL_TABLET | Freq: Every day | ORAL | Status: DC
  Administered 2019-03-13 – 2019-03-29 (×17): 40 mg via ORAL
  Filled 2019-03-13 (×17): qty 1

## 2019-03-13 MED ORDER — DIGOXIN 125 MCG PO TABS
0.1250 mg | ORAL_TABLET | Freq: Every day | ORAL | Status: DC
  Administered 2019-03-14 – 2019-03-30 (×17): 0.125 mg via ORAL
  Filled 2019-03-13 (×17): qty 1

## 2019-03-13 MED ORDER — SODIUM CHLORIDE 0.9% FLUSH
10.0000 mL | Freq: Two times a day (BID) | INTRAVENOUS | Status: DC
  Administered 2019-03-13: 10 mL

## 2019-03-13 MED ORDER — METOCLOPRAMIDE HCL 5 MG PO TABS
10.0000 mg | ORAL_TABLET | Freq: Every day | ORAL | Status: DC
  Administered 2019-03-13 – 2019-03-22 (×10): 10 mg via ORAL
  Filled 2019-03-13 (×10): qty 2

## 2019-03-13 MED ORDER — PANTOPRAZOLE SODIUM 40 MG PO TBEC
40.0000 mg | DELAYED_RELEASE_TABLET | Freq: Every day | ORAL | Status: DC
  Administered 2019-03-14 – 2019-03-30 (×17): 40 mg via ORAL
  Filled 2019-03-13 (×17): qty 1

## 2019-03-13 MED ORDER — TRAMADOL HCL 50 MG PO TABS
100.0000 mg | ORAL_TABLET | Freq: Three times a day (TID) | ORAL | Status: DC
  Administered 2019-03-13 – 2019-03-17 (×10): 100 mg via ORAL
  Filled 2019-03-13 (×11): qty 2

## 2019-03-13 MED ORDER — ONDANSETRON HCL 4 MG/2ML IJ SOLN: 4.0000 mg | Freq: Four times a day (QID) | INTRAMUSCULAR | Status: DC | PRN

## 2019-03-13 MED ORDER — SODIUM CHLORIDE 0.9% FLUSH: 10.0000 mL | INTRAVENOUS | Status: DC | PRN

## 2019-03-13 MED ORDER — ONDANSETRON HCL 4 MG PO TABS
4.0000 mg | ORAL_TABLET | Freq: Four times a day (QID) | ORAL | Status: DC | PRN
  Administered 2019-03-21: 4 mg via ORAL
  Filled 2019-03-13: qty 1

## 2019-03-13 MED ORDER — HEPARIN SODIUM (PORCINE) 5000 UNIT/ML IJ SOLN: 5000.0000 [IU] | Freq: Three times a day (TID) | INTRAMUSCULAR | Status: DC

## 2019-03-13 MED ORDER — ACETAMINOPHEN 325 MG PO TABS
650.0000 mg | ORAL_TABLET | Freq: Four times a day (QID) | ORAL | Status: DC | PRN
  Filled 2019-03-13: qty 2

## 2019-03-13 MED ORDER — POLYETHYLENE GLYCOL 3350 17 G PO PACK
17.0000 g | PACK | Freq: Two times a day (BID) | ORAL | Status: DC
  Administered 2019-03-15 – 2019-03-30 (×12): 17 g via ORAL
  Filled 2019-03-13 (×27): qty 1

## 2019-03-13 MED ORDER — INSULIN ASPART 100 UNIT/ML ~~LOC~~ SOLN
0.0000 [IU] | Freq: Three times a day (TID) | SUBCUTANEOUS | Status: DC
  Administered 2019-03-14: 3 [IU] via SUBCUTANEOUS
  Administered 2019-03-14: 2 [IU] via SUBCUTANEOUS
  Administered 2019-03-15: 3 [IU] via SUBCUTANEOUS
  Administered 2019-03-15: 11 [IU] via SUBCUTANEOUS
  Administered 2019-03-15 – 2019-03-16 (×4): 5 [IU] via SUBCUTANEOUS
  Administered 2019-03-17: 8 [IU] via SUBCUTANEOUS
  Administered 2019-03-17: 3 [IU] via SUBCUTANEOUS
  Administered 2019-03-17 – 2019-03-18 (×2): 5 [IU] via SUBCUTANEOUS
  Administered 2019-03-18 (×2): 2 [IU] via SUBCUTANEOUS
  Administered 2019-03-19: 3 [IU] via SUBCUTANEOUS
  Administered 2019-03-19: 2 [IU] via SUBCUTANEOUS
  Administered 2019-03-20: 3 [IU] via SUBCUTANEOUS
  Administered 2019-03-20: 2 [IU] via SUBCUTANEOUS
  Administered 2019-03-20: 5 [IU] via SUBCUTANEOUS
  Administered 2019-03-21 (×2): 2 [IU] via SUBCUTANEOUS
  Administered 2019-03-22 (×2): 3 [IU] via SUBCUTANEOUS
  Administered 2019-03-23: 5 [IU] via SUBCUTANEOUS
  Administered 2019-03-23: 3 [IU] via SUBCUTANEOUS
  Administered 2019-03-23: 8 [IU] via SUBCUTANEOUS
  Administered 2019-03-24: 3 [IU] via SUBCUTANEOUS
  Administered 2019-03-24: 5 [IU] via SUBCUTANEOUS
  Administered 2019-03-24: 3 [IU] via SUBCUTANEOUS
  Administered 2019-03-25: 2 [IU] via SUBCUTANEOUS
  Administered 2019-03-25 (×2): 3 [IU] via SUBCUTANEOUS
  Administered 2019-03-26: 2 [IU] via SUBCUTANEOUS
  Administered 2019-03-26 (×2): 3 [IU] via SUBCUTANEOUS
  Administered 2019-03-27: 5 [IU] via SUBCUTANEOUS
  Administered 2019-03-27: 2 [IU] via SUBCUTANEOUS
  Administered 2019-03-27 – 2019-03-28 (×2): 5 [IU] via SUBCUTANEOUS
  Administered 2019-03-28: 3 [IU] via SUBCUTANEOUS
  Administered 2019-03-28 – 2019-03-30 (×5): 5 [IU] via SUBCUTANEOUS

## 2019-03-13 MED ORDER — GABAPENTIN 600 MG PO TABS
600.0000 mg | ORAL_TABLET | Freq: Three times a day (TID) | ORAL | Status: DC
  Administered 2019-03-13 – 2019-03-23 (×29): 600 mg via ORAL
  Filled 2019-03-13 (×29): qty 1

## 2019-03-13 MED ORDER — INSULIN GLARGINE 100 UNIT/ML ~~LOC~~ SOLN
60.0000 [IU] | Freq: Two times a day (BID) | SUBCUTANEOUS | Status: DC
  Administered 2019-03-13: 60 [IU] via SUBCUTANEOUS
  Filled 2019-03-13 (×2): qty 0.6

## 2019-03-13 MED ORDER — ASPIRIN EC 81 MG PO TBEC
81.0000 mg | DELAYED_RELEASE_TABLET | Freq: Every day | ORAL | Status: DC
  Administered 2019-03-14 – 2019-03-30 (×17): 81 mg via ORAL
  Filled 2019-03-13 (×17): qty 1

## 2019-03-13 MED ORDER — SPIRONOLACTONE 12.5 MG HALF TABLET
12.5000 mg | ORAL_TABLET | Freq: Every day | ORAL | Status: DC
  Administered 2019-03-14 – 2019-03-15 (×2): 12.5 mg via ORAL
  Filled 2019-03-13 (×2): qty 1

## 2019-03-13 MED ORDER — CLOPIDOGREL BISULFATE 75 MG PO TABS
75.0000 mg | ORAL_TABLET | Freq: Every day | ORAL | Status: DC
  Administered 2019-03-14 – 2019-03-30 (×17): 75 mg via ORAL
  Filled 2019-03-13 (×17): qty 1

## 2019-03-13 MED ORDER — HEPARIN SODIUM (PORCINE) 5000 UNIT/ML IJ SOLN
5000.0000 [IU] | Freq: Three times a day (TID) | INTRAMUSCULAR | Status: DC
  Administered 2019-03-13 – 2019-03-30 (×50): 5000 [IU] via SUBCUTANEOUS
  Filled 2019-03-13 (×49): qty 1

## 2019-03-13 NOTE — Discharge Summary (Addendum)
DISCHARGE SUMMARY  GIANAH BATT  MR#: [id number]  DOB:21-Feb-1951  Date of Admission: 03/06/2019 Date of Discharge: 03/13/2019  Attending Physician:Graycen Degan Hennie Duos, MD  Patient's QZE:SPQZ, Nyra Jabs, MD  Consults: Ortho PCCM CHF Team   Disposition: D/C to CIR   Follow-up Appts: Follow-up Information    [facility] Follow up on 03/23/2019.   Specialty:  Cardiology Why:   Heart Failure F/U 03/23/19 @  10a   -Parking in ER lot (enter under blue awning to left of ER), or underneath [address] in the [place] on [address] (garage code:[id number], elevator to 1st floor).  -Take all am meds and bring all med bottles Contact information: [address] [id number] [place] [phone]          Tests Needing Follow-up: -monitor renal function - BMET suggested 3/17 - if crt continues to rise may need to hold torsemide for a day or 2 -monitor daily weights   Discharge Diagnoses: Open right ankle fracture w/ osteomyelitis s/p R BKA Septic shock (osteomyelitis) v/s Cardiogenic shock CAD Acute on Chronic Systolic CHF  AKI HLD DM Peripheral neuropathy GERD w/ Gastroparesis  Initial presentation: 72SK w/ systolic CHF and severe diabetes s/p a fall > R ankle fracture 2/19. She was being followed by Ortho and scheduled for an ORIF, but was subsequently found to have significant damage to her splint, and was ultimately found to have an open tibia fracture with purulent drainage and necrotic tissue. She required a R BKA, and was extubated post op, but was hypotensive requiring pressors.   Hospital Course: 2/19 initial R ankle fracture 3/9 admit - R BKA 3/12 pressor dependent - CVL placed 3/13 LHC  3/15 [facility] assumed care  3/16 Transferred to CIR   Open right ankle fracture w/ osteomyelitis s/p R BKA Care per Ortho - ready for rehabilitation - wound care/outpt f/u per Ortho   Septic (osteomyelitis) v/s Cardiogenic shock required milrinone and levophed during course of stay - stabilized and off both 48+ hrs at time of d/c - shock physiology resolved  CAD hx of prior anterior MI with LAD stent - Troponin up to 29 this admit > suspect peri-op NSTEMI - LHC 3/13: 95% stenosis ostial/proximal small-moderate ramus (too small for stent placement) and suspected ostial diagonal occlusion, w/ no obstructive disease in the major coronaries - continue ASA, Plavix, and statin - followed by Cardiology during hospital stay   Acute on Chronic Systolic CHF  Ischemic cardiomyopathy - EF 20-25% - CHF Team managed during admit - no evidence of gross volume overload on day of d/c - Cardiac meds as suggested by Cards at time of d/c   AKI Creatinine is trending up at time of d/c - will need to follow trend closely on rehab unit, with likely need to hold diuretic intermittently   HLD Continue Lipitor   DM CBG controlled at time of d/c   Allergies as of 03/13/2019      Reactions   Cymbalta [duloxetine Hcl] Nausea And Vomiting, Other (See Comments)   Vomiting and stiff muscles in legs   Toradol [ketorolac Tromethamine] Hives      Medication List    ASK your doctor about these medications   acetaminophen 500 MG tablet Commonly known as:  TYLENOL Take 1,000 mg by mouth every 6 (six) hours as needed for moderate pain or headache.   atorvastatin 40 MG tablet Commonly known as:  LIPITOR Take 40 mg by  mouth at bedtime.   clonazePAM 0.5 MG tablet Commonly known as:  KLONOPIN Take 0.5 mg by mouth daily as needed for anxiety.   clopidogrel 75 MG tablet Commonly known as:  PLAVIX Take 75 mg by mouth daily with breakfast.   cyclobenzaprine 10 MG tablet Commonly known as:  FLEXERIL Take 1 tablet (10 mg total) by mouth 2 (two) times daily.   diazepam 5 MG tablet Commonly known as:  Valium Take 1 tablet (5 mg total) by mouth every 8 (eight) hours as needed for muscle  spasms.   dicyclomine 10 MG capsule Commonly known as:  Bentyl Take 1 capsule (10 mg total) by mouth 3 (three) times daily with meals as needed for spasms.   diphenoxylate-atropine 2.5-0.025 MG tablet Commonly known as:  LOMOTIL Take 1 tablet by mouth 4 (four) times daily as needed for diarrhea or loose stools.   Entresto 24-26 MG Generic drug:  sacubitril-valsartan Take 1 tablet by mouth 2 (two) times daily.   furosemide 40 MG tablet Commonly known as:  LASIX Take 40 mg by mouth daily.   gabapentin 600 MG tablet Commonly known as:  NEURONTIN Take 1 tablet (600 mg total) by mouth 3 (three) times daily.   insulin aspart 100 UNIT/ML injection Commonly known as:  novoLOG Inject 20-60 Units into the skin 3 (three) times daily before meals. Per sliding scale   insulin glargine 100 UNIT/ML injection Commonly known as:  LANTUS Inject 60 Units into the skin 2 (two) times daily.   metoCLOPramide 10 MG tablet Commonly known as:  REGLAN Take 10 mg by mouth at bedtime.   metolazone 2.5 MG tablet Commonly known as:  ZAROXOLYN Take 2.5 mg by mouth daily.   ondansetron 4 MG tablet Commonly known as:  ZOFRAN Take 4 mg by mouth every 8 (eight) hours as needed for nausea or vomiting.   oxyCODONE-acetaminophen 7.5-325 MG tablet Commonly known as:  Percocet Take 1 tablet by mouth every 4 (four) hours as needed for severe pain.   pantoprazole 40 MG tablet Commonly known as:  PROTONIX Take 40 mg by mouth daily.   traMADol 50 MG tablet Commonly known as:  ULTRAM Take 2 tablets (100 mg total) by mouth every 8 (eight) hours.   Vitamin D 50 MCG (2000 UT) Caps Take 2,000 Units by mouth daily.      Day of Discharge BP 106/60 (BP Location: Right Arm)   Pulse 68   Temp 98.9 F (37.2 C) (Oral)   Resp 18   Ht 5\' 9"  (1.753 m)   Wt 91 kg   SpO2 98%   BMI 29.63 kg/m   Physical Exam: General: No acute respiratory distress - A&O Lungs: Clear to auscultation bilaterally - no  wheezing  Cardiovascular: Regular rate and rhythm without murmur  Abdomen: Nontender, nondistended, soft, bowel sounds positive, no rebound Extremities: No significant edema bilateral lower extremities - R LE dressed and dry   Basic Metabolic Panel: Recent Labs  Lab 03/07/19 0616  03/08/19 0310 03/09/19 0458 03/10/19 0405 03/10/19 1543 03/11/19 0625 03/12/19 0351 03/13/19 1126  NA 133*   < > 132* 134* 135 134* 136 132* 133*  K 6.0*   < > 5.0 4.0 3.3* 4.6 3.5 4.8 3.8  CL 104   < > 104 104 97*  --  95* 92* 93*  CO2 17*   < > 21* 22 27  --  29 27 30   GLUCOSE 293*   < > 218* 172* 135*  --  72 150* 130*  BUN 26*   < > 34* 34* 33*  --  27* 30* 37*  CREATININE 1.45*   < > 1.73* 1.63* 1.42*  --  1.29* 1.45* 1.71*  CALCIUM 8.1*   < > 8.3* 8.4* 8.8*  --  9.0 8.7* 9.2  MG 1.7  --  1.8  --  1.6*  --  2.0 1.9  --   PHOS 4.3  --  2.8  --  2.1*  --  3.9 3.8  --    < > = values in this interval not displayed.    CBC: Recent Labs  Lab 03/06/19 1904  03/09/19 0458 03/10/19 0405 03/10/19 1543 03/11/19 0625 03/12/19 0351 03/13/19 1126  WBC 20.5*   < > 16.6* 16.6*  --  12.4* 11.4* 10.6*  NEUTROABS 18.2*  --   --   --   --   --   --   --   HGB 6.4*   < > 7.8* 8.6* 8.5* 8.8* 8.2* 7.9*  HCT 21.6*   < > 25.2* 27.4* 25.0* 27.6* 26.6* 26.3*  MCV 84.0   < > 86.3 85.4  --  84.7 86.9 87.1  PLT 364   < > 308 363  --  350 328 326   < > = values in this interval not displayed.    Cardiac Enzymes: Recent Labs  Lab 03/06/19 1904 03/07/19 0033 03/07/19 0616 03/07/19 1114 03/08/19 0946  TROPONINI 0.03* 2.08* 23.21* 29.49* 20.09*   BNP (last 3 results) Recent Labs    02/09/19 1322  BNP 706.0*     CBG: Recent Labs  Lab 03/13/19 0028 03/13/19 0524 03/13/19 0744 03/13/19 1311 03/13/19 1645  GLUCAP 160* 114* 120* 103* 113*    Recent Results (from the past 240 hour(s))  Culture, blood (routine x 2)     Status: None   Collection Time: 03/06/19  7:04 PM  Result Value Ref Range  Status   Specimen Description BLOOD LEFT FOOT  Final   Special Requests   Final    BOTTLES DRAWN AEROBIC AND ANAEROBIC Blood Culture adequate volume   Culture   Final    NO GROWTH 5 DAYS Performed at [facility], [place] [address] [place], [place] [address]    Report Status 03/11/2019 FINAL  Final  MRSA PCR Screening     Status: None   Collection Time: 03/06/19  8:19 PM  Result Value Ref Range Status   MRSA by PCR NEGATIVE NEGATIVE Final    Comment:        The GeneXpert MRSA Assay (FDA approved for NASAL specimens only), is one component of a comprehensive MRSA colonization surveillance program. It is not intended to diagnose MRSA infection nor to guide or monitor treatment for MRSA infections. Performed at [facility], [place] [address], [place], [place] [address]   Culture, blood (routine x 2)     Status: None   Collection Time: 03/06/19  9:54 PM  Result Value Ref Range Status   Specimen Description BLOOD LEFT HAND  Final   Special Requests   Final    BOTTLES DRAWN AEROBIC ONLY Blood Culture results may not be optimal due to an inadequate volume of blood received in culture bottles   Culture   Final    NO GROWTH 5 DAYS Performed at [facility], [place] [address] [place], [place] [address]    Report Status 03/11/2019 FINAL  Final      Time spent  in discharge (includes decision making & examination of pt): 35 minutes  03/13/2019, 6:41 PM   Cherene Altes, MD [facility] Office  [phone]

## 2019-03-13 NOTE — Progress Notes (Signed)
Julia Arn, MD  Physician  Physical Medicine and Rehabilitation  PMR Pre-admission  Addendum  Date of Service:  03/13/2019 10:48 AM       Related encounter: Admission (Current) from 03/06/2019 in [facility]         Show:Clear all [x] Manual[x] Template[x] Copied  Added by: [x] Michel Santee, PT  [] Hover for details PMR Admission Coordinator Pre-Admission Assessment  Patient: [name] is an 68 y.o., female MRN: [id number] DOB: December 02, 1951 Height: 5\' 9"  (175.3 cm) Weight: 91 kg                                                                                                                                                  Insurance Information HMO: no    PPO:      PCP:      IPA:      80/20:      OTHER:  PRIMARY: Medicare A and B      Policy#: [id number]      Subscriber: patient CM Name:       Phone#:      Fax#:  Pre-Cert#:  Verified via Air traffic controller:  Benefits:  Phone #:      Name:  Eff. Date: 10/28/2008  (A and B)  Deduct: $1408      Out of Pocket Max: n/a      Life Max: n/a CIR: 100%      SNF: 20 days Outpatient: 80%     Co-Pay: 20% Home Health: 100%      Co-Pay:  DME: 80%     Co-Pay: 20%  SECONDARY: Tricare for Life      Policy#: [id number]      Subscriber: patient CM Name:       Phone#:      Fax#:  Pre-Cert#:       Employer:  Benefits:  Phone #:      Name:  Eff. Date:      Deduct:       Out of Pocket Max:       Life Max:  CIR:       SNF:  Outpatient:      Co-Pay:  Home Health:       Co-Pay:  DME:      Co-Pay:   Medicaid Application Date:       Case Manager:  Disability Application Date:       Case Worker:   Emergency Contact Information         Contact Information    Name Relation Home Work Mobile   [place] Friend [phone]  [phone]   Laray Anger [phone]  [phone]   Juanell Fairly Daughter   [phone]     Current Medical History  Patient Admitting  Diagnosis: R BKA History of Present Illness: [name] Is a  68 year old right-handed female with history of diabetes mellitus maintained on Lantus insulin 60 units twice a day, diastolic congestive heart failure maintained on Lasix 40 mg daily, CAD with stenting 2008 maintained on Plavix followed by cardiology services Dr. Chancy Milroy, panic attacks.  Presented to [facility] on 03/06/2019 after a recent fall 02/15/2019 sustaining a right ankle fracture followed by orthopedic services. She was initially placed in a splint as an outpatient after undergoing reduction in the emergency room and referred to orthopedic services. She had another fall after that first occurrence did not seek medical help. Noted bleeding through the splint after most recent fall and again did not pursue medical attention. Upon evaluation noted extremely foul odor and the tibia was exposed. 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 Mardelle Matte. Postoperative hypotension required intubation and maintained on pressors. Findings of elevated troponin with cardiology services follow-up. Echocardiogram with ejection fraction 25% severely reduced systolic function. No intervention required remained on aspirin and Plavix therapy as well as initially intravenous heparin. Patient did undergo gentle diuresis close monitoring of renal function 1.8. Follow critical care medicine for septic shock in the setting of right ankle infection. She was initially maintained on ProAmatine for orthostasis. Acute blood loss anemia 7.8-8.2.Subcutaneous heparin for DVT prophylaxis.   Past Medical History      Past Medical History:  Diagnosis Date  . Below-knee amputation of right lower extremity ([place]) 03/06/2019  . 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 CHF (congestive heart failure) ([place])   . Complication of anesthesia   . Coronary artery disease   . Diabetes mellitus without complication ([place])    type 2   . Fracture     right ankle  . Fracture of humerus, proximal, left, closed 02/23/2014  . Full dentures   . Myocardial infarct ([place])   . Neuropathic pain   . Panic attacks    Hx: of  . PONV (postoperative nausea and vomiting)   . Wears glasses     Family History  family history includes Diabetes in her brother and mother; Hypertension in her mother; Stroke in her mother.  Prior Rehab/Hospitalizations:  Has the patient had major surgery during 100 days prior to admission? Yes, R BKA on 03/06/2019  Current Medications   Current Facility-Administered Medications:  .  0.9 %  sodium chloride infusion, 250 mL, Intravenous, Continuous, Jennelle Human B, NP, Stopped at 03/11/19 1007 .  acetaminophen (TYLENOL) tablet 650 mg, 650 mg, Oral, Q6H PRN, Cherene Altes, MD, 650 mg at 03/13/19 1027 .  aspirin EC tablet 81 mg, 81 mg, Oral, Daily, Shirley Friar, PA-C, 81 mg at 03/13/19 1026 .  atorvastatin (LIPITOR) tablet 40 mg, 40 mg, Oral, QHS, Marchia Bond, MD, 40 mg at 03/12/19 2344 .  bisacodyl (DULCOLAX) suppository 10 mg, 10 mg, Rectal, Daily PRN, Cherene Altes, MD .  cholecalciferol (VITAMIN D3) tablet 2,000 Units, 2,000 Units, Oral, Daily, Marchia Bond, MD, 2,000 Units at 03/13/19 1027 .  clonazePAM (KLONOPIN) disintegrating tablet 0.5 mg, 0.5 mg, Oral, BID PRN, Jennelle Human B, NP, 0.5 mg at 03/13/19 0658 .  clonazePAM (KLONOPIN) tablet 0.5 mg, 0.5 mg, Oral, Daily PRN, Marchia Bond, MD, 0.5 mg at 03/12/19 1708 .  clopidogrel (PLAVIX) tablet 75 mg, 75 mg, Oral, Daily, Larey Dresser, MD, 75 mg at 03/13/19 1026 .  digoxin (LANOXIN) tablet 0.125 mg, 0.125 mg, Oral, Daily, Larey Dresser, MD, 0.125 mg at 03/13/19 1025 .  diphenhydrAMINE (BENADRYL)  12.5 MG/5ML elixir 12.5-25 mg, 12.5-25 mg, Oral, Q4H PRN, Marchia Bond, MD .  docusate sodium (COLACE) capsule 100 mg, 100 mg, Oral, BID, Marchia Bond, MD, 100 mg at 03/13/19 1025 .  gabapentin (NEURONTIN) tablet 600 mg, 600  mg, Oral, TID, Lore, Melissa A, RPH, 600 mg at 03/13/19 1025 .  heparin injection 5,000 Units, 5,000 Units, Subcutaneous, Q8H, Marchia Bond, MD, 5,000 Units at 03/13/19 1426 .  HYDROmorphone (DILAUDID) injection 0.5-1 mg, 0.5-1 mg, Intravenous, Q4H PRN, Marchia Bond, MD, 0.5 mg at 03/08/19 0307 .  insulin aspart (novoLOG) injection 0-15 Units, 0-15 Units, Subcutaneous, TID WC, Anders Simmonds, MD, 3 Units at 03/12/19 1830 .  insulin aspart (novoLOG) injection 0-5 Units, 0-5 Units, Subcutaneous, QHS, Anders Simmonds, MD, 2 Units at 03/10/19 2225 .  insulin aspart (novoLOG) injection 7 Units, 7 Units, Subcutaneous, TID WC, Erick Colace, NP, 7 Units at 03/13/19 1425 .  insulin glargine (LANTUS) injection 60 Units, 60 Units, Subcutaneous, BID, Marchia Bond, MD, 60 Units at 03/13/19 1026 .  metoCLOPramide (REGLAN) tablet 10 mg, 10 mg, Oral, QHS, Marchia Bond, MD, 10 mg at 03/12/19 2344 .  ondansetron (ZOFRAN) tablet 4 mg, 4 mg, Oral, Q6H PRN **OR** ondansetron (ZOFRAN) injection 4 mg, 4 mg, Intravenous, Q6H PRN, Marchia Bond, MD .  oxyCODONE (Oxy IR/ROXICODONE) immediate release tablet 10-15 mg, 10-15 mg, Oral, Q4H PRN, Marchia Bond, MD, 10 mg at 03/07/19 1146 .  oxyCODONE (Oxy IR/ROXICODONE) immediate release tablet 5-10 mg, 5-10 mg, Oral, Q4H PRN, Marchia Bond, MD, 5 mg at 03/13/19 3267 .  pantoprazole (PROTONIX) EC tablet 40 mg, 40 mg, Oral, Daily, Marchia Bond, MD, 40 mg at 03/13/19 1028 .  polyethylene glycol (MIRALAX / GLYCOLAX) packet 17 g, 17 g, Oral, BID, Cherene Altes, MD, 17 g at 03/12/19 1101 .  sodium chloride flush (NS) 0.9 % injection 10-40 mL, 10-40 mL, Intracatheter, Q12H, Cherene Altes, MD, 10 mL at 03/13/19 1030 .  sodium chloride flush (NS) 0.9 % injection 10-40 mL, 10-40 mL, Intracatheter, PRN, Cherene Altes, MD .  sodium phosphate (FLEET) 7-19 GM/118ML enema 1 enema, 1 enema, Rectal, Daily PRN, Cherene Altes, MD .  sorbitol 70 % solution  30-45 mL, 30-45 mL, Oral, Daily PRN, Joette Catching T, MD .  sorbitol, milk of mag, mineral oil, glycerin (SMOG) enema, 960 mL, Rectal, Once PRN, Cherene Altes, MD .  spironolactone (ALDACTONE) tablet 12.5 mg, 12.5 mg, Oral, Daily, Larey Dresser, MD, 12.5 mg at 03/13/19 1026 .  torsemide (DEMADEX) tablet 40 mg, 40 mg, Oral, Daily, Larey Dresser, MD, 40 mg at 03/13/19 1033 .  traMADol (ULTRAM) tablet 100 mg, 100 mg, Oral, TID, Lore, Melissa A, RPH, 100 mg at 03/13/19 1033 .  zolpidem (AMBIEN) tablet 5 mg, 5 mg, Oral, QHS PRN, Marchia Bond, MD  Patients Current Diet:     Diet Order                  Diet heart healthy/carb modified Room service appropriate? Yes; Fluid consistency: Thin  Diet effective now               Precautions / Restrictions Precautions Precautions: Fall, Other (comment) Precaution Comments: watch BP Restrictions Weight Bearing Restrictions: Yes Other Position/Activity Restrictions: Pt R NWB with new BKA   Has the patient had 2 or more falls or a fall with injury in the past year?Yes, original fall on 02/15/2019 with R ankle fracture, second fall on 02/28/2019 with  R ankle open fracture  Prior Activity Level Limited Community (1-2x/wk): got out 1-2x/week for appointments, etc.  Was not driving due to diabetic retinopathy  Home Assistive Devices / [employer] Devices/Equipment: CBG Meter, Environmental consultant (specify type), Crutches, Eyeglasses, Dentures (specify type) Home Equipment: Toilet riser, Crutches, Walker - standard  Prior Device Use: Indicate devices/aids used by the patient prior to current illness, exacerbation or injury? None of the above  Prior Functional Level Prior Function Level of Independence: Independent Comments: does not drive  Self Care: Did the patient need help bathing, dressing, using the toilet or eating?  Independent  Indoor Mobility: Did the patient need assistance with walking from room to room  (with or without device)? Independent  Stairs: Did the patient need assistance with internal or external stairs (with or without device)? Independent  Functional Cognition: Did the patient need help planning regular tasks such as shopping or remembering to take medications? Independent  Current Functional Level Cognition  Overall Cognitive Status: Within Functional Limits for tasks assessed Orientation Level: Oriented X4    Extremity Assessment (includes Sensation/Coordination)  Upper Extremity Assessment: Generalized weakness  Lower Extremity Assessment: Defer to PT evaluation    ADLs  Overall ADL's : Needs assistance/impaired Grooming: Set up, Bed level Upper Body Bathing: Moderate assistance, Sitting Lower Body Bathing: Maximal assistance, Sitting/lateral leans Upper Body Dressing : Moderate assistance, Sitting Lower Body Dressing: Maximal assistance, Sitting/lateral leans Toilet Transfer: +2 for physical assistance, Moderate assistance Toileting- Clothing Manipulation and Hygiene: Total assistance Functional mobility during ADLs: Moderate assistance, +2 for physical assistance(sit - stand only) General ADL Comments: Movement patterns in preparation for LB ADL at bed level. HOB elevated.Pt  unable to place LLE ontop of knee. Will most likely need to use AE. Unable to feel L foot. Began education regarding residual limb desensitization.     Mobility  Overal bed mobility: Needs Assistance Bed Mobility: Supine to Sit Rolling: Supervision Sidelying to sit: Mod assist Supine to sit: Mod assist, HOB elevated Sit to supine: Mod assist, +2 for physical assistance General bed mobility comments: Pt seated edge of bed on arrival.      Transfers  Overall transfer level: Needs assistance Equipment used: Rolling walker (2 wheeled), None Transfers: Sit to/from Stand, Google Transfers Sit to Stand: Mod assist(Pt able to stand at edge of bed from elevated seat height of  bed.  She was unable to pivot to move from bed to recliner chair.  ) Squat pivot transfers: Mod assist, +2 safety/equipment  Lateral/Scoot Transfers: +2 physical assistance, Mod assist General transfer comment: Pt stood from elevated bed but unable to maintain longer than 10 sec trial and unable to achieve pivot.  Pt required +2 moderate assistance for squat pivot ( face to face ) tech assisted with lines and leads.      Ambulation / Gait / Stairs / Wheelchair Mobility  Ambulation/Gait Ambulation/Gait assistance: (NT) General Gait Details: unable     Posture / Balance Dynamic Sitting Balance Sitting balance - Comments: supervision sitting EOB Balance Overall balance assessment: Needs assistance Sitting-balance support: No upper extremity supported, Feet unsupported Sitting balance-Leahy Scale: Fair Sitting balance - Comments: supervision sitting EOB Standing balance-Leahy Scale: Poor    Special needs/care consideration BiPAP/CPAP no CPM no Continuous Drip IV no Dialysis no        Days  Life Vest no Oxygen on O2 in hospital, 2L via nasal cannula Special Bed no  Trach Size no Wound Vac (area) no  Location no Skin: MASD and incision        Location MASD to sacrum, incision to R BKA residual limb Bowel mgmt: continent, last BM 03/12/2019 Bladder mgmt: continent with accidents Diabetic mgmt yes, lantus and novolog     Previous Home Environment Living Arrangements: Children  Lives With: Daughter Available Help at Discharge: Available 24 hours/day, Family Type of Home: House Home Layout: One level Home Access: Stairs to enter Entrance Stairs-Rails: None Technical brewer of Steps: 2 Bathroom Shower/Tub: Public librarian, Industrial/product designer: No Home Care Services: No Additional Comments: working on getting a ramp installed  Discharge Living Setting Plans for Discharge Living Setting: Patient's home Type of Home at  Discharge: House Discharge Home Layout: One level Discharge Home Access: Stairs to enter Technical brewer of Steps: 2 Discharge Bathroom Shower/Tub: Tub/shower unit Discharge Bathroom Toilet: Standard Discharge Bathroom Accessibility: No Does the patient have any problems obtaining your medications?: No  Social/Family/Support Systems Patient Roles: Parent Anticipated Caregiver: Daughter, Darrick Penna Anticipated Caregiver's Contact Information: [phone] Ability/Limitations of Caregiver: none Caregiver Availability: 24/7 Discharge Plan Discussed with Primary Caregiver: Yes Is Caregiver In Agreement with Plan?: Yes Does Caregiver/Family have Issues with Lodging/Transportation while Pt is in Rehab?: No   Goals/Additional Needs Patient/Family Goal for Rehab: PT/OT mod I wheelchair level Expected length of stay: 10-14 days Cultural Considerations: none Dietary Needs: heart healthy, carb modified Equipment Needs: tbd Pt/Family Agrees to Admission and willing to participate: Yes Program Orientation Provided & Reviewed with Pt/Caregiver Including Roles  & Responsibilities: Yes   Decrease burden of Care through IP rehab admission: n/a   Possible need for SNF placement upon discharge: not anticipated   Patient Condition: This patient's medical and functional status has changed since the consult dated: 03/09/2019 in which the Rehabilitation Physician determined and documented that the patient's condition is appropriate for intensive rehabilitative care in an inpatient rehabilitation facility. See "History of Present Illness" (above) for medical update. Functional changes are: mod +2 for lateral scoot bed to chair. Patient's medical and functional status update has been discussed with the Rehabilitation physician and patient remains appropriate for inpatient rehabilitation. Will admit to inpatient rehab today.  Preadmission Screen Completed By:  Michel Santee, 03/13/2019 2:39  PM ______________________________________________________________________   Discussed status with Dr. Posey Pronto on 03/13/19 at 2:39 PM and received telephone approval for admission today.  Admission Coordinator:  Michel Santee, time 2:39 PM Sudie Grumbling 03/13/19        Revision History

## 2019-03-13 NOTE — IPOC Note (Signed)
Overall Plan of Care (IPOC) Patient Details Name: [name] MRN: [id number] DOB: [birth year]  Admitting Diagnosis: Right BKA  Hospital Problems: Active Problems:   Right below-knee amputee ([place])   AKI (acute kidney injury) ([place])   Diabetic osteomyelitis ([place])   Hypokalemia   Diabetic peripheral neuropathy ([place])   Labile blood glucose   Hypoglycemia   Acute on chronic systolic heart failure ([place])     Functional Problem List: Nursing Medication Management, Bladder, Bowel, Motor, Skin Integrity, Safety, Endurance  PT Balance, Edema, Endurance, Motor, Pain, Sensory, Safety  OT Balance, Endurance, Motor, Pain, Safety, Skin Integrity  SLP    TR         Basic ADL's: OT Grooming, Bathing, Dressing, Toileting     Advanced  ADL's: OT       Transfers: PT Bed Mobility, Bed to Chair, Furniture, Teacher, early years/pre, Metallurgist: PT Stairs, Emergency planning/management officer, Ambulation     Additional Impairments: OT None  SLP        TR      Anticipated Outcomes Item Anticipated Outcome  Self Feeding    Swallowing      Basic self-care  Supervision- Min assist  Toileting  Supervision   Bathroom Transfers Supervsion  Bowel/Bladder  manage bowel and bladder with min assist  Transfers  supervision  Locomotion  supervisoin w/c level  Communication     Cognition     Pain     Safety/Judgment  maintain safety with min assist   Therapy Plan: PT Intensity: Minimum of 1-2 x/day ,45 to 90 minutes PT Frequency: 5 out of 7 days PT Duration Estimated Length of Stay: 14-17 days OT Intensity: Minimum of 1-2 x/day, 45 to 90 minutes OT Frequency: 5 out of 7 days OT Duration/Estimated Length of Stay: 14-17 days      Team Interventions: Nursing Interventions Bowel Management, Patient/Family Education, Pain Management, Skin Care/Wound Management, Discharge Planning, Medication Management, Disease Management/Prevention, Bladder Management  PT interventions  Ambulation/gait training, Community reintegration, DME/adaptive equipment instruction, Neuromuscular re-education, Stair training, UE/LE Strength taining/ROM, Wheelchair propulsion/positioning, Training and development officer, Discharge planning, Pain management, Therapeutic Activities, UE/LE Coordination activities, Functional mobility training, Patient/family education, Splinting/orthotics, Therapeutic Exercise  OT Interventions Training and development officer, Discharge planning, Disease mangement/prevention, DME/adaptive equipment instruction, Functional mobility training, Pain management, Patient/family education, Psychosocial support, Self Care/advanced ADL retraining, Skin care/wound managment, Splinting/orthotics, Therapeutic Activities, Therapeutic Exercise, UE/LE Strength taining/ROM, Wheelchair propulsion/positioning  SLP Interventions    TR Interventions    SW/CM Interventions Discharge Planning, Psychosocial Support, Patient/Family Education   Barriers to Discharge MD  Medical stability, Wound care and Weight bearing restrictions  Nursing      PT Inaccessible home environment    OT Inaccessible home environment    SLP      SW Inaccessible home environment Will need a ramp to get up the stairs and landlord [place] in another [place] right [place]   Team Discharge Planning: Destination: PT-Home ,OT- Home , SLP-  Projected Follow-up: PT-Home health PT, OT-  Home health OT, SLP-  Projected Equipment Needs: PT-To be determined, OT- 3 in 1 bedside comode, Tub/shower bench, SLP-  Equipment Details: PT- , OT-  Patient/family involved in discharge planning: PT- Patient,  OT-Patient, SLP-   MD ELOS: 12-15 days. Medical Rehab Prognosis:  Good Assessment:  68 year old right-handed female with history of diabetes mellitus maintained on Lantus insulin 60 units twice a day, diastolic congestive heart failure maintained on Lasix 40 mg daily, CAD with stenting 2008 maintained on  Plavix followed by cardiology  services Dr. Chancy Milroy, panic attacks. Presented 03/06/2019 after a recent fall 02/15/2019 sustaining a right ankle fracture followed by orthopedic services. She was initially placed in a splint as an outpatient after undergoing reduction in the emergency room and referred to orthopedic services. She had another fall, did not seek medical help. Noted bleeding through the splint after most recent fall and again did not pursue medical attention. Upon evaluation noted extremely foul odor and the tibia was exposed. 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 Mardelle Matte. Postoperative hypotension required intubation maintained on pressors. Findings of elevated troponin with cardiology services follow-up. Echocardiogram with ejection fraction 25% severely reduced systolic function. No intervention required remained on aspirin and Plavix therapy as well as initially intravenous heparin. Patient did undergo gentle diuresis close monitoring of ren 68 year old right-handed female with history of diabetes mellitus maintained on Lantus insulin 60 units twice a day, diastolic congestive heart failure maintained on Lasix 40 mg daily, CAD with stenting 2008 maintained on Plavix followed by cardiology services Dr. Chancy Milroy, panic attacks. Per chart review and patient, patient lives with daughter and assistance as needed. One level home with 2 steps to entry. Independent prior to admission. Presented 03/06/2019 after a recent fall 02/15/2019 sustaining a right ankle fracture followed by orthopedic services. She was initially placed in a splint as an outpatient after undergoing reduction in the emergency room and referred to orthopedic services. She had another fall, did not seek medical help. Noted bleeding through the splint after most recent fall and again did not pursue medical attention. Upon evaluation noted extremely foul odor and the tibia was exposed. Limb was not felt to be salvageable and underwent right  BKA 03/06/2019 per Dr. Mardelle Matte. Postoperative hypotension required intubation maintained on pressors. Findings of elevated troponin with cardiology services follow-up. Echocardiogram with ejection fraction 25% severely reduced systolic function. No intervention required remained on aspirin and Plavix therapy as well as initially intravenous heparin. Patient did undergo gentle diuresis close monitoring of renal function. Follow critical care medicine for septic shock in the setting of right ankle infection. She was initially maintained on ProAmatine for orthostasis.  Follow critical care medicine for septic shock in the setting of right ankle infection. She was initially maintained on ProAmatine for orthostasis. Acute blood loss anemia. Patient with resulting functional deficits with mobility, transfers, self-care.  Will set goals for Supervision for most tasks with PT/OT  See Team Conference Notes for weekly updates to the plan of care

## 2019-03-13 NOTE — Progress Notes (Signed)
Patient ID: [name], female   DOB: [birth year], 68 y.o.   MRN: [id number] Admitted to unit, reviewed orders, medications, plan of care and rehab routine. Upset that purewick will not be in use, incontinent of bladder and refused to notify the nurse when she felt urge to void. Reviewed plan to assist pt to the toilet or BSC. States an understanding of information reviewed.  Margarito Liner

## 2019-03-13 NOTE — Progress Notes (Addendum)
Patient ID: LILIT CINELLI, female   DOB: 28-Dec-1951, 68 y.o.   MRN: [id number]     Advanced Heart Failure Rounding Note  PCP-Cardiologist: No primary care provider on file.   Subjective:    SBPs 90-100s off NE and midodrine. Cr pending this am.   Denies CP. No lightheadedness or dizziness. She states this am she was SOB when sitting up, and has had more leg pain.    LHC/RHC (3/14): Hemodynamics:  LV 101/39 Ao 92/50 RA 14 CO (Fick) 6.28 CI (Fick) 2.92 Diagnostic  Dominance: Right  Left Main  Minimal disease.  Left Anterior Descending  Large LAD wrapping around apex. There is a patent proximal LAD stent. Diffuse luminal irregularities. There was probably a diagonal originating from the mid LAD that was occluded [place].  Ramus Intermedius  Small to moderate ramus with 95% ostial/proximal stenosis.  Left Circumflex  50% stenosis mid LCx and 50% stenosis distal LCx. 50% stenosis in proximal small-moderate OM1.  Right Coronary Artery  50% stenosis distal RCA/ostial PLV.   Objective:   Weight Range: 91 kg Body mass index is 29.63 kg/m.   Vital Signs:   Temp:  [98.1 F (36.7 C)-98.9 F (37.2 C)] 98.1 F (36.7 C) (03/16 0433) Pulse Rate:  [71-79] 79 (03/15 1500) Resp:  [17-23] 18 (03/15 2215) BP: (74-118)/(46-60) 104/58 (03/16 0430) SpO2:  [89 %-96 %] 96 % (03/15 1500) Weight:  [91 kg] 91 kg (03/16 0500) Last BM Date: 03/12/19  Weight change: Filed Weights   03/11/19 0500 03/12/19 0500 03/13/19 0500  Weight: 93.2 kg 93.1 kg 91 kg   Intake/Output:  No intake or output data in the 24 hours ending 03/13/19 0832  Physical Exam    General: NAD  HEENT: Normal Neck: Supple. JVP 5-6. Carotids 2+ bilat; no bruits. No thyromegaly or nodule noted. Cor: PMI nondisplaced. RRR, No M/G/R noted Lungs: CTAB, normal effort. Abdomen: Soft, non-tender, non-distended, no HSM. No bruits or masses. +BS  Extremities: No cyanosis, clubbing, or rash. R BKA.  Neuro: Alert &  orientedx3, cranial nerves grossly intact. moves all 4 extremities w/o difficulty. Affect pleasant   Telemetry   NSR 70-80s, personally reviewed.   EKG   No new tracings.    Labs    CBC Recent Labs    03/11/19 0625 03/12/19 0351  WBC 12.4* 11.4*  HGB 8.8* 8.2*  HCT 27.6* 26.6*  MCV 84.7 86.9  PLT 350 446   Basic Metabolic Panel Recent Labs    03/11/19 0625 03/12/19 0351  NA 136 132*  K 3.5 4.8  CL 95* 92*  CO2 29 27  GLUCOSE 72 150*  BUN 27* 30*  CREATININE 1.29* 1.45*  CALCIUM 9.0 8.7*  MG 2.0 1.9  PHOS 3.9 3.8   Liver Function Tests No results for input(s): AST, ALT, ALKPHOS, BILITOT, PROT, ALBUMIN in the last 72 hours. No results for input(s): LIPASE, AMYLASE in the last 72 hours. Cardiac Enzymes No results for input(s): CKTOTAL, CKMB, CKMBINDEX, TROPONINI in the last 72 hours.  BNP: BNP (last 3 results) Recent Labs    02/09/19 1322  BNP 706.0*    ProBNP (last 3 results) No results for input(s): PROBNP in the last 8760 hours.   D-Dimer No results for input(s): DDIMER in the last 72 hours. Hemoglobin A1C No results for input(s): HGBA1C in the last 72 hours. Fasting Lipid Panel No results for input(s): CHOL, HDL, LDLCALC, TRIG, CHOLHDL, LDLDIRECT in the last 72 hours. Thyroid Function Tests No results for input(s):  TSH, T4TOTAL, T3FREE, THYROIDAB in the last 72 hours.  Invalid input(s): FREET3  Other results:  Imaging   No results found.  Medications:    Scheduled Medications: . aspirin EC  81 mg Oral Daily  . atorvastatin  40 mg Oral QHS  . cholecalciferol  2,000 Units Oral Daily  . clopidogrel  75 mg Oral Daily  . digoxin  0.125 mg Oral Daily  . docusate sodium  100 mg Oral BID  . gabapentin  600 mg Oral TID  . heparin injection (subcutaneous)  5,000 Units Subcutaneous Q8H  . insulin aspart  0-15 Units Subcutaneous TID WC  . insulin aspart  0-5 Units Subcutaneous QHS  . insulin aspart  7 Units Subcutaneous TID WC  . insulin  glargine  60 Units Subcutaneous BID  . metoCLOPramide  10 mg Oral QHS  . pantoprazole  40 mg Oral Daily  . polyethylene glycol  17 g Oral BID  . spironolactone  12.5 mg Oral Daily  . torsemide  40 mg Oral Daily  . traMADol  100 mg Oral TID    Infusions: . sodium chloride Stopped (03/11/19 1007)    PRN Medications: acetaminophen, bisacodyl, clonazepam, clonazePAM, diphenhydrAMINE, HYDROmorphone (DILAUDID) injection, ondansetron **OR** ondansetron (ZOFRAN) IV, oxyCODONE, oxyCODONE, sodium phosphate, sorbitol, sorbitol, milk of mag, mineral oil, glycerin (SMOG) enema, zolpidem  Patient Profile   DEAUN ROCHA is a 68 y.o. female with h/o CAD s/p LAD stent, chronic systolic CHF, DM2, neuropathy, gastroparesis, anxiety, panic attacks, and breast cancer s/p right mastectomy.   Suffered an NSTEMI in setting of R BKA. Troponin peaked at 23. Dr. Gwenlyn Found assessed and recommended no intervention at this time with relative lack of symptoms. CHF team consulted for med titration.   Assessment/Plan   1. CAD: Patient has history of prior anterior MI with LAD stent. She developed hypotension but no significant ECG changes or chest pain post-op 3/9. Troponin up to 29. Suspect peri-op NSTEMI. LHC on 3/13 showed 95% stenosis ostial/proximal small-moderate ramus and suspected ostial diagonal occlusion.  One of these vessels may have been culprit lesion, no obstructive disease in the major coronaries, no interventional target (ramus too small for stent placement).  - No chest pain.  - Continue ASA and statin.  - Continue Plavix 75 mg daily.   2. Shock: Suspect mixed septic/cardiogenic. Septic shock with osteomyelitis, WBCs trending down and afebrile, cultures negative.  - Now off pressors. BP soft but stable off midodrine.  - Currently on cefepime.  3. Acute on chronic systolic CHF: Ischemic cardiomyopathy. Apparently, baseline EF is in the 25-30% range. Echo this admission with EF 20-25%.  Volume status stable. No CVP. No coox drawn.  - Off midodrine today.   - Continue digoxin 0.125 daily.  - Continue torsemide 40 mg daily. Labs pending.  - Continue spironolactone 12.5 mg daily.  4. AKI: Creatinine up to 1.8, now 1.45. Suspect prerenal in setting of septic shock.  - Cr pending today.  5. Anemia: Hgb 7.8 => 8.6 => 8.8 => 8.2 but no overt bleeding.  FOBT+.  - Fe deficient. Given feraheme.  Further per primary.  6. Osteomyelitis:  - s/p right BKA. Ortho following.   Labs pending. She has improved from a cardiac standpoint. Continue to titrate medicines as tolerated. She is off midodrine and NE and relatively stable.    Shirley Friar, PA-C  03/13/2019 8:32 AM  Advanced Heart Failure Team Pager [phone] (M-F; 7a - 4p)  Please contact [facility] for night-coverage after  hours (4p -7a ) and weekends on [email or web address]  Patient seen with PA, agree with the above note.   She is stable this morning off midodrine and norepinephrine though BP soft.    Main complaint is at right leg surgical site.   On exam, no JVD.  No left ankle edema.  Regular S1S2.   No labs this morning, for now would continue current medical regimen but need stat BMET and CBC.  No BP room to titrate HF meds. Volume status looks ok on exam.   Loralie Champagne 03/13/2019 9:43 AM  Creatinine up a bit to 1.7 on BMET.  Would reassess in am, if continues to rise may need to hold torsemide for a day or 2.   Loralie Champagne 03/13/2019 3:05 PM

## 2019-03-13 NOTE — Progress Notes (Signed)
Physical Therapy Treatment Patient Details Name: [name] MRN: [id number] DOB: 1951-11-14 Today's Date: 03/13/2019    History of Present Illness 53 yoF with systolic HF and severe diabetes s/p initial fall with R ankle fracture 2/19.  F/u w/ ortho and scheduled for ORIF but found to have significant damage to splint with bleeding and open tibia fracture with purulent drainage and necrotic tissue.  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  EBL ~700.  Extubated post op but was hypotensive requiring pressors.  Labs pending.     PT Comments    Pt performed transfer training and strengthening exercises.  Pt lacks physical strength to stand longer than 10 seconds and is unable to pivot with RW.  She require face to face squat pivot.  Pt continues to be motivated to improve she remains an excellent candidate for CIR therapies.  Plan for continued B LE strengthening next session.  Educated on performing hip extension in sidelying once returned to bed.    Follow Up Recommendations  CIR     Equipment Recommendations  Wheelchair (measurements PT);Rolling walker with 5" wheels    Recommendations for Other Services Rehab consult     Precautions / Restrictions Precautions Precautions: Fall;Other (comment) Precaution Comments: watch BP Restrictions Weight Bearing Restrictions: Yes Other Position/Activity Restrictions: Pt R NWB with new BKA    Mobility  Bed Mobility               General bed mobility comments: Pt seated edge of bed on arrival.    Transfers Overall transfer level: Needs assistance Equipment used: Rolling walker (2 wheeled);None Transfers: Sit to/from [employer] Sit to Stand: Mod assist(Pt able to stand at edge of bed from elevated seat height of bed.  She was unable to pivot to move from bed to recliner chair.  )   Squat pivot transfers: Mod assist;+2 safety/equipment     General transfer comment: Pt stood from elevated bed but unable to  maintain longer than 10 sec trial and unable to achieve pivot.  Pt required +2 moderate assistance for squat pivot ( face to face ) tech assisted with lines and leads.    Ambulation/Gait Ambulation/Gait assistance: (NT)               Stairs             Wheelchair Mobility    Modified Rankin (Stroke Patients Only)       Balance Overall balance assessment: Needs assistance Sitting-balance support: No upper extremity supported;Feet unsupported Sitting balance-Leahy Scale: Fair Sitting balance - Comments: supervision sitting EOB     Standing balance-Leahy Scale: Poor                              Cognition Arousal/Alertness: Awake/alert Behavior During Therapy: WFL for tasks assessed/performed Overall Cognitive Status: Within Functional Limits for tasks assessed                                        Exercises General Exercises - Lower Extremity Ankle Circles/Pumps: AROM;Left;10 reps;Supine Quad Sets: AROM;Both;10 reps;Supine Long Arc Quad: AROM;Both;10 reps;Seated Hip ABduction/ADduction: AROM;Both;10 reps;AAROM;Supine Hip Flexion/Marching: AROM;Both;10 reps;Seated    General Comments        Pertinent Vitals/Pain Pain Assessment: Faces Faces Pain Scale: Hurts even more Pain Location: R knee Pain Descriptors / Indicators: Grimacing;Guarding;Sore Pain Intervention(s): Monitored  during session;Repositioned    Home Living                      Prior Function            PT Goals (current goals can now be found in the care plan section) Acute Rehab PT Goals Patient Stated Goal: to go to rehab at  the hospital Potential to Achieve Goals: Good Progress towards PT goals: Progressing toward goals    Frequency    Min 3X/week      PT Plan Current plan remains appropriate    Co-evaluation              AM-PAC PT "6 Clicks" Mobility   Outcome Measure  Help needed turning from your back to your side while  in a flat bed without using bedrails?: A Little Help needed moving from lying on your back to sitting on the side of a flat bed without using bedrails?: A Lot Help needed moving to and from a bed to a chair (including a wheelchair)?: A Lot Help needed standing up from a chair using your arms (e.g., wheelchair or bedside chair)?: A Lot Help needed to walk in hospital room?: Total Help needed climbing 3-5 steps with a railing? : Total 6 Click Score: 11    End of Session Equipment Utilized During Treatment: Gait belt Activity Tolerance: Patient tolerated treatment well Patient left: in chair;with call bell/phone within reach;with chair alarm set Nurse Communication: Mobility status PT Visit Diagnosis: Unsteadiness on feet (R26.81)     Time: 0762-2633 PT Time Calculation (min) (ACUTE ONLY): 25 min  Charges:  $Therapeutic Exercise: 8-22 mins $Therapeutic Activity: 8-22 mins                     Governor Rooks, PTA Acute Rehabilitation Services Pager [phone] Office [phone]     [name] 03/13/2019, 1:16 PM

## 2019-03-13 NOTE — Progress Notes (Signed)
Report given to [name], pt transferred to 4M13 in stable condition via unit bed with belongings.  [id number]

## 2019-03-13 NOTE — H&P (Signed)
Physical Medicine and Rehabilitation Admission H&P    Chief complaint: stump pain  HPI: [name]  Is a 68 year old right-handed female with history of diabetes mellitus maintained on Lantus insulin 60 units twice a day, diastolic congestive heart failure maintained on Lasix 40 mg daily, CAD with stenting 2008 maintained on Plavix followed by cardiology services Dr. Chancy Milroy, panic attacks. Per chart review and patient, patient lives with daughter and assistance as needed. One level home with 2 steps to entry. Independent prior to admission. Presented 03/06/2019 after a recent fall 02/15/2019 sustaining a right ankle fracture followed by orthopedic services. She was initially placed in a splint as an outpatient after undergoing reduction in the emergency room and referred to orthopedic services. She had another fall, did not seek medical help. Noted bleeding through the splint after most recent fall and again did not pursue medical attention. Upon evaluation noted extremely foul odor and the tibia was exposed. 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 Mardelle Matte. Postoperative hypotension required intubation maintained on pressors. Findings of elevated troponin with cardiology services follow-up. Echocardiogram with ejection fraction 25% severely reduced systolic function. No intervention required remained on aspirin and Plavix therapy as well as initially intravenous heparin. Patient did undergo gentle diuresis close monitoring of renal function  1.8. Follow critical care medicine for septic shock in the setting of right ankle infection. She was initially maintained on ProAmatine for orthostasis. Acute blood loss anemia 7.8-8.2.Subcutaneous heparin for DVT prophylaxis. Therapy evaluations completed with recommendations of physical medicine rehabilitation consult. Patient was admitted for a compress rehabilitation program.  Review of Systems  Constitutional:  Negative for chills.  HENT: Negative for hearing loss.   Eyes: Negative for blurred vision and double vision.  Respiratory: Negative for cough and shortness of breath.   Cardiovascular: Positive for leg swelling. Negative for chest pain and palpitations.  Gastrointestinal: Positive for constipation. Negative for heartburn, nausea and vomiting.  Genitourinary: Negative for dysuria, flank pain and hematuria.  Musculoskeletal: Positive for joint pain and myalgias.  Skin: Negative for rash.  Neurological: Negative for loss of consciousness.  Psychiatric/Behavioral:       Panic attacks  All other systems reviewed and are negative.  Past Medical History:  Diagnosis Date  . Below-knee amputation of right lower extremity ([place]) 03/06/2019  . 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 CHF (congestive heart failure) ([place])   . Complication of anesthesia   . Coronary artery disease   . Diabetes mellitus without complication ([place])    type 2   . Fracture    right ankle  . Fracture of humerus, proximal, left, closed 02/23/2014  . Full dentures   . Myocardial infarct ([place])   . Neuropathic pain   . Panic attacks    Hx: of  . PONV (postoperative nausea and vomiting)   . Wears glasses    Past Surgical History:  Procedure Laterality Date  . APPENDECTOMY    . BREAST BIOPSY Left 02/12/15   benign, clip placed  . BREAST SURGERY Right 2012   Right mastectomy with SLN, DCIS, grade 1-2. ER/PR not performed.   Marland Kitchen CARDIAC CATHETERIZATION    . cardiac stents  2007  . CHOLECYSTECTOMY    . COLON SURGERY     Subtotal colectomy for colonic inertia.  . COLONOSCOPY    . DILATION AND CURETTAGE OF UTERUS    . EYE SURGERY    . LEFT HEART CATH AND CORONARY  ANGIOGRAPHY Left 01/14/2018   Procedure: LEFT HEART CATH AND CORONARY ANGIOGRAPHY;  Surgeon: Dionisio David, MD;  Location: [facility];  Service: Cardiovascular;  Laterality: Left;  . LEFT HEART CATH AND CORONARY ANGIOGRAPHY N/A 03/10/2019   Procedure: LEFT  HEART CATH AND CORONARY ANGIOGRAPHY;  Surgeon: Larey Dresser, MD;  Location: [facility];  Service: Cardiovascular;  Laterality: N/A;  . MASTECTOMY Right 2012   Hx: of right breast, DCIS   . MULTIPLE TOOTH EXTRACTIONS    . ORIF ANKLE FRACTURE Right 03/06/2019   Procedure: RIGHT BELOW KNEE AMPUTATION;  Surgeon: Marchia Bond, MD;  Location: [facility];  Service: Orthopedics;  Laterality: Right;  . ORIF HUMERUS FRACTURE Left 02/23/2014   Procedure: OPEN REDUCTION INTERNAL FIXATION (ORIF) PROXIMAL HUMERUS FRACTURE;  Surgeon: Johnny Bridge, MD;  Location: [facility];  Service: Orthopedics;  Laterality: Left;  . TUBAL LIGATION     Family History  Problem Relation Age of Onset  . Diabetes Mother   . Hypertension Mother   . Stroke Mother   . Diabetes Brother   . Breast cancer Neg Hx    Social History:  reports that she has never smoked. She has never used smokeless tobacco. She reports that she does not drink alcohol or use drugs. Allergies:  Allergies  Allergen Reactions  . Cymbalta [Duloxetine Hcl] Nausea And Vomiting and Other (See Comments)    Vomiting and stiff muscles in legs  . Toradol [Ketorolac Tromethamine] Hives   Medications Prior to Admission  Medication Sig Dispense Refill  . acetaminophen (TYLENOL) 500 MG tablet Take 1,000 mg by mouth every 6 (six) hours as needed for moderate pain or headache.    Marland Kitchen atorvastatin (LIPITOR) 40 MG tablet Take 40 mg by mouth at bedtime.    . Cholecalciferol (VITAMIN D) 2000 units CAPS Take 2,000 Units by mouth daily.     . clonazePAM (KLONOPIN) 0.5 MG tablet Take 0.5 mg by mouth daily as needed for anxiety.     . clopidogrel (PLAVIX) 75 MG tablet Take 75 mg by mouth daily with breakfast.    . cyclobenzaprine (FLEXERIL) 10 MG tablet Take 1 tablet (10 mg total) by mouth 2 (two) times daily. (Patient taking differently: Take 10 mg by mouth at bedtime. ) 60 tablet 5  . diazepam (VALIUM) 5 MG tablet Take 1 tablet (5 mg total) by mouth every 8 (eight)  hours as needed for muscle spasms. 20 tablet 0  . diphenoxylate-atropine (LOMOTIL) 2.5-0.025 MG tablet Take 1 tablet by mouth 4 (four) times daily as needed for diarrhea or loose stools.     . furosemide (LASIX) 40 MG tablet Take 40 mg by mouth daily.    Marland Kitchen gabapentin (NEURONTIN) 600 MG tablet Take 1 tablet (600 mg total) by mouth 3 (three) times daily. 90 tablet 5  . insulin aspart (NOVOLOG) 100 UNIT/ML injection Inject 20-60 Units into the skin 3 (three) times daily before meals. Per sliding scale    . insulin glargine (LANTUS) 100 UNIT/ML injection Inject 60 Units into the skin 2 (two) times daily.    . metoCLOPramide (REGLAN) 10 MG tablet Take 10 mg by mouth at bedtime.     . metolazone (ZAROXOLYN) 2.5 MG tablet Take 2.5 mg by mouth daily.    Marland Kitchen oxyCODONE-acetaminophen (PERCOCET) 7.5-325 MG tablet Take 1 tablet by mouth every 4 (four) hours as needed for severe pain. 20 tablet 0  . pantoprazole (PROTONIX) 40 MG tablet Take 40 mg by mouth daily.     Marland Kitchen  sacubitril-valsartan (ENTRESTO) 24-26 MG Take 1 tablet by mouth 2 (two) times daily.     . traMADol (ULTRAM) 50 MG tablet Take 2 tablets (100 mg total) by mouth every 8 (eight) hours. 540 tablet 0  . dicyclomine (BENTYL) 10 MG capsule Take 1 capsule (10 mg total) by mouth 3 (three) times daily with meals as needed for spasms. (Patient not taking: Reported on 02/21/2019) 15 capsule 0  . ondansetron (ZOFRAN) 4 MG tablet Take 4 mg by mouth every 8 (eight) hours as needed for nausea or vomiting.       Drug Regimen Review Drug regimen was reviewed and remains appropriate with no significant issues identified  Home: Home Living Family/patient expects to be discharged to:: Private residence Living Arrangements: Children Available Help at Discharge: Available 24 hours/day, Family Type of Home: House Home Access: Stairs to enter Technical brewer of Steps: 2 Entrance Stairs-Rails: None Home Layout: One level Bathroom Shower/Tub: Tub/shower  unit, Architectural technologist: Standard Bathroom Accessibility: No Home Equipment: Toilet riser, Crutches, Environmental consultant - standard   Functional History: Prior Function Level of Independence: Independent Comments: drives;   Functional Status:  Mobility: Bed Mobility Overal bed mobility: Needs Assistance Bed Mobility: Supine to Sit Rolling: Supervision Sidelying to sit: Mod assist Supine to sit: Mod assist, HOB elevated Sit to supine: Mod assist, +2 for physical assistance General bed mobility comments: +rail, cues for sequencing, assist to elevate trunk and scoot to EOB Transfers Overall transfer level: Needs assistance Equipment used: Rolling walker (2 wheeled) Transfers: Sit to/from Stand, Lateral/Scoot Transfers Sit to Stand: Mod assist, +2 physical assistance  Lateral/Scoot Transfers: +2 physical assistance, Mod assist General transfer comment: Pt able to clear bottom from bed for sit to stand with RW but unable to attain full upright stance. Lateral/scoot transfer toward L using bed pad to assist. Cues for sequencing. Pt with good participation.  Ambulation/Gait General Gait Details: unable     ADL: ADL Overall ADL's : Needs assistance/impaired Grooming: Set up, Bed level Upper Body Bathing: Moderate assistance, Sitting Lower Body Bathing: Maximal assistance, Sitting/lateral leans Upper Body Dressing : Moderate assistance, Sitting Lower Body Dressing: Maximal assistance, Sitting/lateral leans Toilet Transfer: +2 for physical assistance, Moderate assistance Toileting- Clothing Manipulation and Hygiene: Total assistance Functional mobility during ADLs: Moderate assistance, +2 for physical assistance(sit - stand only) General ADL Comments: Movement patterns in preparation for LB ADL at bed level. HOB elevated.Pt  unable to place LLE ontop of knee. Will most likely need to use AE. Unable to feel L foot. Began education regarding residual limb desensitization.   Cognition:  Cognition Overall Cognitive Status: Within Functional Limits for tasks assessed Orientation Level: Oriented X4 Cognition Arousal/Alertness: Awake/alert Behavior During Therapy: WFL for tasks assessed/performed Overall Cognitive Status: Within Functional Limits for tasks assessed  Physical Exam: Blood pressure (!) 104/58, pulse 79, temperature 98.1 F (36.7 C), resp. rate 18, height _0  (1.753 m), weight 91 kg, SpO2 96 %. Physical Exam  Constitutional: She appears well-developed and well-nourished.  HENT:  Head: Normocephalic and atraumatic.  Eyes: EOM are normal. Right eye exhibits no discharge. Left eye exhibits no discharge.  Neck: Normal range of motion. Neck supple.  Cardiovascular: Normal rate and regular rhythm.  Respiratory: Effort normal and breath sounds normal.  + [place]  GI: Soft. Bowel sounds are normal.  Musculoskeletal:     Comments: Right stump edema with tenderness  Neurological: She is alert.  She follows full commands. Keeps eyes closed Motor: Bilateral upper extremities: 4-/5 proximal distal  Left lower extremity: 4/5 proximal distal Right lower extremity: Hip flexion 3/5 (pain inhibition) Sensation diminished to light touch left foot  Skin:  Right stump with dressing CD/I  Psychiatric: Her affect is blunt. She is withdrawn.    Results for orders placed or performed during the hospital encounter of 03/06/19 (from the past 48 hour(s))  Glucose, capillary     Status: Abnormal   Collection Time: 03/11/19 11:40 AM  Result Value Ref Range   Glucose-Capillary 130 (H) 70 - 99 mg/dL  Glucose, capillary     Status: Abnormal   Collection Time: 03/11/19  3:55 PM  Result Value Ref Range   Glucose-Capillary 134 (H) 70 - 99 mg/dL  Glucose, capillary     Status: Abnormal   Collection Time: 03/11/19 10:05 PM  Result Value Ref Range   Glucose-Capillary 113 (H) 70 - 99 mg/dL  .Cooxemetry Panel (carboxy, met, total hgb, O2 sat)     Status: Abnormal   Collection Time:  03/12/19  3:40 AM  Result Value Ref Range   Total hemoglobin 8.3 (L) 12.0 - 16.0 g/dL   O2 Saturation 78.5 %   Carboxyhemoglobin 1.7 (H) 0.5 - 1.5 %   Methemoglobin 1.8 (H) 0.0 - 1.5 %  CBC     Status: Abnormal   Collection Time: 03/12/19  3:51 AM  Result Value Ref Range   WBC 11.4 (H) 4.0 - 10.5 K/uL   RBC 3.06 (L) 3.87 - 5.11 MIL/uL   Hemoglobin 8.2 (L) 12.0 - 15.0 g/dL   HCT 26.6 (L) 36.0 - 46.0 %   MCV 86.9 80.0 - 100.0 fL   MCH 26.8 26.0 - 34.0 pg   MCHC 30.8 30.0 - 36.0 g/dL   RDW 17.2 (H) 11.5 - 15.5 %   Platelets 328 150 - 400 K/uL   nRBC 0.3 (H) 0.0 - 0.2 %    Comment: Performed at [facility], [address], [place], [place] [address]  Basic metabolic panel     Status: Abnormal   Collection Time: 03/12/19  3:51 AM  Result Value Ref Range   Sodium 132 (L) 135 - 145 mmol/L   Potassium 4.8 3.5 - 5.1 mmol/L   Chloride 92 (L) 98 - 111 mmol/L   CO2 27 22 - 32 mmol/L   Glucose, Bld 150 (H) 70 - 99 mg/dL   BUN 30 (H) 8 - 23 mg/dL   Creatinine, Ser 1.45 (H) 0.44 - 1.00 mg/dL   Calcium 8.7 (L) 8.9 - 10.3 mg/dL   GFR calc non Af Amer 37 (L) >60 mL/min   GFR calc Af Amer 42 (L) >60 mL/min   Anion gap 13 5 - 15    Comment: Performed at [facility] [address] [place], [place] [address]  Magnesium     Status: None   Collection Time: 03/12/19  3:51 AM  Result Value Ref Range   Magnesium 1.9 1.7 - 2.4 mg/dL    Comment: Performed at [facility] [address] [place], [place] [address]  Phosphorus     Status: None   Collection Time: 03/12/19  3:51 AM  Result Value Ref Range   Phosphorus 3.8 2.5 - 4.6 mg/dL    Comment: Performed at [facility] [address] [place], [place] [address]  Glucose, capillary     Status: Abnormal   Collection Time: 03/12/19  7:43 AM  Result Value Ref Range   Glucose-Capillary 160 (H) 70 - 99 mg/dL  Glucose, capillary     Status: Abnormal   Collection Time: 03/12/19 11:26 AM  Result Value Ref Range    Glucose-Capillary 179 (H) 70 - 99 mg/dL  Glucose, capillary     Status: Abnormal   Collection Time: 03/12/19  6:17 PM  Result Value Ref Range   Glucose-Capillary 156 (H) 70 - 99 mg/dL  Glucose, capillary     Status: Abnormal   Collection Time: 03/12/19  9:24 PM  Result Value Ref Range   Glucose-Capillary 168 (H) 70 - 99 mg/dL  Glucose, capillary     Status: Abnormal   Collection Time: 03/13/19 12:28 AM  Result Value Ref Range   Glucose-Capillary 160 (H) 70 - 99 mg/dL  Glucose, capillary     Status: Abnormal   Collection Time: 03/13/19  5:24 AM  Result Value Ref Range   Glucose-Capillary 114 (H) 70 - 99 mg/dL  Glucose, capillary     Status: Abnormal   Collection Time: 03/13/19  7:44 AM  Result Value Ref Range   Glucose-Capillary 120 (H) 70 - 99 mg/dL  Cooxemetry Panel (carboxy, met, total hgb, O2 sat)     Status: Abnormal   Collection Time: 03/13/19  9:35 AM  Result Value Ref Range   Total hemoglobin 9.6 (L) 12.0 - 16.0 g/dL   O2 Saturation 52.6 %   Carboxyhemoglobin 1.5 0.5 - 1.5 %   Methemoglobin 2.1 (H) 0.0 - 1.5 %   No results found.     Medical Problem List and Plan: 1.  Decreased functional mobility secondary to right BKA 82/70/7867 complicated by perioperative non-STEMI/septic shock  Admit to CIR 2.  Antithrombotics: -DVT/anticoagulation:  Subcutaneous heparin  -antiplatelet therapy:  Aspirin 81 mg daily, Plavix 75 mg daily 3. Pain Management:  Neurontin 600 mg 3 times a day,tramadol 100 mg 3 times a day, oxycodone as needed 4. Mood: Klonopin 0.5 mg twice a day as needed  -antipsychotic agents: see above 5. Neuropsych: This patient is capable of making decisions on his own behalf. 6. Skin/Wound Care:  Routine skin checks 7. Fluids/Electrolytes/Nutrition: routine and out's with follow-up chemistries tomorrow a.m. 8. Acute blood loss anemia. Follow-up CBC tomorrow a.m. 9. 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 Monitor for any signs of  fluid overload. Lanoxin 0.125 mg daily, Aldactone 12.5 mg daily, Demadex 40 mg daily. Patient is off ProAmatine.  Daily weights 10. Diabetes mellitus with peripheral neuropathy. Hemoglobin A1c 7.8. Lantus insulin 60 units twice a day, NovoLog 7 units 3 times a day.  Monitor with increased mobility 11. Hyperlipidemia. Lipitor 12.  Supplemental oxygen dependent  Wean as tolerated  Delice Lesch, MD, ABPMR Lavon Paganini Angiulli, PA-C 03/13/2019

## 2019-03-13 NOTE — Progress Notes (Addendum)
Inpatient Rehab Admissions:  Inpatient Rehab Consult received.  I met with patient at the bedside for rehabilitation assessment and to discuss goals and expectations of an inpatient rehab admission.  She is interested in an inpatient rehab stay.  Will call daughter to confirm 24/7 at discharge and call MD to confirm whether I can admit patient today.  I will notify RN/CM team of intent.  Please call with questions.   Addendum: I may not have a bed available today.  I will let team know.    Signed: Shann Medal, PT, DPT Admissions Coordinator [phone] 03/13/19  10:02 AM

## 2019-03-13 NOTE — PMR Pre-admission (Addendum)
PMR Admission Coordinator Pre-Admission Assessment  Patient: [name] is an 68 y.o., female MRN: [id number] DOB: January 12, 1951 Height: 5\' 9"  (175.3 cm) Weight: 91 kg              Insurance Information HMO: no    PPO:      PCP:      IPA:      80/20:      OTHER:  PRIMARY: Medicare A and B      Policy#: [id number]      Subscriber: patient CM Name:       Phone#:      Fax#:  Pre-Cert#:  Verified via Air traffic controller:  Benefits:  Phone #:      Name:  Eff. Date: 10/28/2008  (A and B)  Deduct: $1408      Out of Pocket Max: n/a      Life Max: n/a CIR: 100%      SNF: 20 days Outpatient: 80%     Co-Pay: 20% Home Health: 100%      Co-Pay:  DME: 80%     Co-Pay: 20%  SECONDARY: Tricare for Life      Policy#: [id number]      Subscriber: patient CM Name:       Phone#:      Fax#:  Pre-Cert#:       Employer:  Benefits:  Phone #:      Name:  Eff. Date:      Deduct:       Out of Pocket Max:       Life Max:  CIR:       SNF:  Outpatient:      Co-Pay:  Home Health:       Co-Pay:  DME:      Co-Pay:   Medicaid Application Date:       Case Manager:  Disability Application Date:       Case Worker:   Emergency Contact Information Contact Information    Name Relation Home Work [place] [phone]  [phone]   Laray Anger [phone]  [phone]   Juanell Fairly Daughter   [phone]     Current Medical History  Patient Admitting Diagnosis: R BKA History of Present Illness: [name]  Is a 68 year old right-handed female with history of diabetes mellitus maintained on Lantus insulin 60 units twice a day, diastolic congestive heart failure maintained on Lasix 40 mg daily, CAD with stenting 2008 maintained on Plavix followed by cardiology services Dr. Chancy Milroy, panic attacks.  Presented to [facility] on 03/06/2019 after a recent fall 02/15/2019 sustaining a right ankle fracture followed by orthopedic services. She was initially placed in a  splint as an outpatient after undergoing reduction in the emergency room and referred to orthopedic services. She had another fall after that first occurrence did not seek medical help. Noted bleeding through the splint after most recent fall and again did not pursue medical attention. Upon evaluation noted extremely foul odor and the tibia was exposed. 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 Mardelle Matte. Postoperative hypotension required intubation and maintained on pressors. Findings of elevated troponin with cardiology services follow-up. Echocardiogram with ejection fraction 25% severely reduced systolic function. No intervention required remained on aspirin and Plavix therapy as well as initially intravenous heparin. Patient did undergo gentle diuresis close monitoring of renal function  1.8. Follow critical care medicine for septic shock in the setting of right  ankle infection. She was initially maintained on ProAmatine for orthostasis. Acute blood loss anemia 7.8-8.2.Subcutaneous heparin for DVT prophylaxis.       Past Medical History  Past Medical History:  Diagnosis Date  . Below-knee amputation of right lower extremity ([place]) 03/06/2019  . 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 CHF (congestive heart failure) ([place])   . Complication of anesthesia   . Coronary artery disease   . Diabetes mellitus without complication ([place])    type 2   . Fracture    right ankle  . Fracture of humerus, proximal, left, closed 02/23/2014  . Full dentures   . Myocardial infarct ([place])   . Neuropathic pain   . Panic attacks    Hx: of  . PONV (postoperative nausea and vomiting)   . Wears glasses     Family History  family history includes Diabetes in her brother and mother; Hypertension in her mother; Stroke in her mother.  Prior Rehab/Hospitalizations:  Has the patient had major surgery during 100 days prior to admission? Yes, R BKA on 03/06/2019  Current Medications   Current  Facility-Administered Medications:  .  0.9 %  sodium chloride infusion, 250 mL, Intravenous, Continuous, Jennelle Human B, NP, Stopped at 03/11/19 1007 .  acetaminophen (TYLENOL) tablet 650 mg, 650 mg, Oral, Q6H PRN, Cherene Altes, MD, 650 mg at 03/13/19 1027 .  aspirin EC tablet 81 mg, 81 mg, Oral, Daily, Shirley Friar, PA-C, 81 mg at 03/13/19 1026 .  atorvastatin (LIPITOR) tablet 40 mg, 40 mg, Oral, QHS, Marchia Bond, MD, 40 mg at 03/12/19 2344 .  bisacodyl (DULCOLAX) suppository 10 mg, 10 mg, Rectal, Daily PRN, Cherene Altes, MD .  cholecalciferol (VITAMIN D3) tablet 2,000 Units, 2,000 Units, Oral, Daily, Marchia Bond, MD, 2,000 Units at 03/13/19 1027 .  clonazePAM (KLONOPIN) disintegrating tablet 0.5 mg, 0.5 mg, Oral, BID PRN, Jennelle Human B, NP, 0.5 mg at 03/13/19 0658 .  clonazePAM (KLONOPIN) tablet 0.5 mg, 0.5 mg, Oral, Daily PRN, Marchia Bond, MD, 0.5 mg at 03/12/19 1708 .  clopidogrel (PLAVIX) tablet 75 mg, 75 mg, Oral, Daily, Larey Dresser, MD, 75 mg at 03/13/19 1026 .  digoxin (LANOXIN) tablet 0.125 mg, 0.125 mg, Oral, Daily, Larey Dresser, MD, 0.125 mg at 03/13/19 1025 .  diphenhydrAMINE (BENADRYL) 12.5 MG/5ML elixir 12.5-25 mg, 12.5-25 mg, Oral, Q4H PRN, Marchia Bond, MD .  docusate sodium (COLACE) capsule 100 mg, 100 mg, Oral, BID, Marchia Bond, MD, 100 mg at 03/13/19 1025 .  gabapentin (NEURONTIN) tablet 600 mg, 600 mg, Oral, TID, Lore, Melissa A, RPH, 600 mg at 03/13/19 1025 .  heparin injection 5,000 Units, 5,000 Units, Subcutaneous, Q8H, Marchia Bond, MD, 5,000 Units at 03/13/19 1426 .  HYDROmorphone (DILAUDID) injection 0.5-1 mg, 0.5-1 mg, Intravenous, Q4H PRN, Marchia Bond, MD, 0.5 mg at 03/08/19 0307 .  insulin aspart (novoLOG) injection 0-15 Units, 0-15 Units, Subcutaneous, TID WC, Anders Simmonds, MD, 3 Units at 03/12/19 1830 .  insulin aspart (novoLOG) injection 0-5 Units, 0-5 Units, Subcutaneous, QHS, Anders Simmonds, MD, 2 Units  at 03/10/19 2225 .  insulin aspart (novoLOG) injection 7 Units, 7 Units, Subcutaneous, TID WC, Erick Colace, NP, 7 Units at 03/13/19 1425 .  insulin glargine (LANTUS) injection 60 Units, 60 Units, Subcutaneous, BID, Marchia Bond, MD, 60 Units at 03/13/19 1026 .  metoCLOPramide (REGLAN) tablet 10 mg, 10 mg, Oral, QHS, Marchia Bond, MD, 10 mg at 03/12/19 2344 .  ondansetron (ZOFRAN) tablet 4  mg, 4 mg, Oral, Q6H PRN **OR** ondansetron (ZOFRAN) injection 4 mg, 4 mg, Intravenous, Q6H PRN, Marchia Bond, MD .  oxyCODONE (Oxy IR/ROXICODONE) immediate release tablet 10-15 mg, 10-15 mg, Oral, Q4H PRN, Marchia Bond, MD, 10 mg at 03/07/19 1146 .  oxyCODONE (Oxy IR/ROXICODONE) immediate release tablet 5-10 mg, 5-10 mg, Oral, Q4H PRN, Marchia Bond, MD, 5 mg at 03/13/19 6295 .  pantoprazole (PROTONIX) EC tablet 40 mg, 40 mg, Oral, Daily, Marchia Bond, MD, 40 mg at 03/13/19 1028 .  polyethylene glycol (MIRALAX / GLYCOLAX) packet 17 g, 17 g, Oral, BID, Cherene Altes, MD, 17 g at 03/12/19 1101 .  sodium chloride flush (NS) 0.9 % injection 10-40 mL, 10-40 mL, Intracatheter, Q12H, Cherene Altes, MD, 10 mL at 03/13/19 1030 .  sodium chloride flush (NS) 0.9 % injection 10-40 mL, 10-40 mL, Intracatheter, PRN, Cherene Altes, MD .  sodium phosphate (FLEET) 7-19 GM/118ML enema 1 enema, 1 enema, Rectal, Daily PRN, Cherene Altes, MD .  sorbitol 70 % solution 30-45 mL, 30-45 mL, Oral, Daily PRN, Joette Catching T, MD .  sorbitol, milk of mag, mineral oil, glycerin (SMOG) enema, 960 mL, Rectal, Once PRN, Cherene Altes, MD .  spironolactone (ALDACTONE) tablet 12.5 mg, 12.5 mg, Oral, Daily, Larey Dresser, MD, 12.5 mg at 03/13/19 1026 .  torsemide (DEMADEX) tablet 40 mg, 40 mg, Oral, Daily, Larey Dresser, MD, 40 mg at 03/13/19 1033 .  traMADol (ULTRAM) tablet 100 mg, 100 mg, Oral, TID, Lore, Melissa A, RPH, 100 mg at 03/13/19 1033 .  zolpidem (AMBIEN) tablet 5 mg, 5 mg, Oral, QHS PRN,  Marchia Bond, MD  Patients Current Diet:  Diet Order            Diet heart healthy/carb modified Room service appropriate? Yes; Fluid consistency: Thin  Diet effective now              Precautions / Restrictions Precautions Precautions: Fall, Other (comment) Precaution Comments: watch BP Restrictions Weight Bearing Restrictions: Yes Other Position/Activity Restrictions: Pt R NWB with new BKA   Has the patient had 2 or more falls or a fall with injury in the past year?Yes, original fall on 02/15/2019 with R ankle fracture, second fall on 02/28/2019 with R ankle open fracture  Prior Activity Level Limited Community (1-2x/wk): got out 1-2x/week for appointments, etc.  Was not driving due to diabetic retinopathy  Home Assistive Devices / Jay Devices/Equipment: CBG Meter, Environmental consultant (specify type), Crutches, Eyeglasses, Dentures (specify type) Home Equipment: Toilet riser, Crutches, Walker - standard  Prior Device Use: Indicate devices/aids used by the patient prior to current illness, exacerbation or injury? None of the above  Prior Functional Level Prior Function Level of Independence: Independent Comments: does not drive  Self Care: Did the patient need help bathing, dressing, using the toilet or eating?  Independent  Indoor Mobility: Did the patient need assistance with walking from room to room (with or without device)? Independent  Stairs: Did the patient need assistance with internal or external stairs (with or without device)? Independent  Functional Cognition: Did the patient need help planning regular tasks such as shopping or remembering to take medications? Independent  Current Functional Level Cognition  Overall Cognitive Status: Within Functional Limits for tasks assessed Orientation Level: Oriented X4    Extremity Assessment (includes Sensation/Coordination)  Upper Extremity Assessment: Generalized weakness  Lower Extremity Assessment:  Defer to PT evaluation    ADLs  Overall ADL's : Needs assistance/impaired Grooming:  Set up, Bed level Upper Body Bathing: Moderate assistance, Sitting Lower Body Bathing: Maximal assistance, Sitting/lateral leans Upper Body Dressing : Moderate assistance, Sitting Lower Body Dressing: Maximal assistance, Sitting/lateral leans Toilet Transfer: +2 for physical assistance, Moderate assistance Toileting- Clothing Manipulation and Hygiene: Total assistance Functional mobility during ADLs: Moderate assistance, +2 for physical assistance(sit - stand only) General ADL Comments: Movement patterns in preparation for LB ADL at bed level. HOB elevated.Pt  unable to place LLE ontop of knee. Will most likely need to use AE. Unable to feel L foot. Began education regarding residual limb desensitization.     Mobility  Overal bed mobility: Needs Assistance Bed Mobility: Supine to Sit Rolling: Supervision Sidelying to sit: Mod assist Supine to sit: Mod assist, HOB elevated Sit to supine: Mod assist, +2 for physical assistance General bed mobility comments: Pt seated edge of bed on arrival.      Transfers  Overall transfer level: Needs assistance Equipment used: Rolling walker (2 wheeled), None Transfers: Sit to/from Stand, [employer] Transfers Sit to Stand: Mod assist(Pt able to stand at edge of bed from elevated seat height of bed.  She was unable to pivot to move from bed to recliner chair.  ) Squat pivot transfers: Mod assist, +2 safety/equipment  Lateral/Scoot Transfers: +2 physical assistance, Mod assist General transfer comment: Pt stood from elevated bed but unable to maintain longer than 10 sec trial and unable to achieve pivot.  Pt required +2 moderate assistance for squat pivot ( face to face ) tech assisted with lines and leads.      Ambulation / Gait / Stairs / Wheelchair Mobility  Ambulation/Gait Ambulation/Gait assistance: (NT) General Gait Details: unable     Posture / Balance  Dynamic Sitting Balance Sitting balance - Comments: supervision sitting EOB Balance Overall balance assessment: Needs assistance Sitting-balance support: No upper extremity supported, Feet unsupported Sitting balance-Leahy Scale: Fair Sitting balance - Comments: supervision sitting EOB Standing balance-Leahy Scale: Poor    Special needs/care consideration BiPAP/CPAP no CPM no Continuous Drip IV no Dialysis no        Days  Life Vest no Oxygen on O2 in hospital, 2L via nasal cannula Special Bed no  Trach Size no Wound Vac (area) no      Location no Skin: MASD and incision        Location MASD to sacrum, incision to R BKA residual limb Bowel mgmt: continent, last BM 03/12/2019 Bladder mgmt: continent with accidents Diabetic mgmt yes, lantus and novolog     Previous Home Environment Living Arrangements: Children  Lives With: Daughter Available Help at Discharge: Available 24 hours/day, Family Type of Home: House Home Layout: One level Home Access: Stairs to enter Entrance Stairs-Rails: None Technical brewer of Steps: 2 Bathroom Shower/Tub: Public librarian, Architectural technologist: Programmer, systems: No Home Care Services: No Additional Comments: working on getting a ramp installed  Discharge Living Setting Plans for Discharge Living Setting: Patient's home Type of Home at Discharge: House Discharge Home Layout: One level Discharge Home Access: Stairs to enter Entrance Stairs-Number of Steps: 2 Discharge Bathroom Shower/Tub: Tub/shower unit Discharge Bathroom Toilet: Standard Discharge Bathroom Accessibility: No Does the patient have any problems obtaining your medications?: No  Social/Family/Support Systems Patient Roles: Parent Anticipated Caregiver: Daughter, Darrick Penna Anticipated Caregiver's Contact Information: [phone] Ability/Limitations of Caregiver: none Caregiver Availability: 24/7 Discharge Plan Discussed with Primary Caregiver:  Yes Is Caregiver In Agreement with Plan?: Yes Does Caregiver/Family have Issues with Lodging/Transportation while Pt is in Rehab?: No  Goals/Additional Needs Patient/Family Goal for Rehab: PT/OT mod I wheelchair level Expected length of stay: 10-14 days Cultural Considerations: none Dietary Needs: heart healthy, carb modified Equipment Needs: tbd Pt/Family Agrees to Admission and willing to participate: Yes Program Orientation Provided & Reviewed with Pt/Caregiver Including Roles  & Responsibilities: Yes   Decrease burden of Care through IP rehab admission: n/a   Possible need for SNF placement upon discharge: not anticipated   Patient Condition: This patient's medical and functional status has changed since the consult dated: 03/09/2019 in which the Rehabilitation Physician determined and documented that the patient's condition is appropriate for intensive rehabilitative care in an inpatient rehabilitation facility. See "History of Present Illness" (above) for medical update. Functional changes are: mod +2 for lateral scoot bed to chair. Patient's medical and functional status update has been discussed with the Rehabilitation physician and patient remains appropriate for inpatient rehabilitation. Will admit to inpatient rehab today.  Preadmission Screen Completed By:  Michel Santee, 03/13/2019 2:39 PM ______________________________________________________________________   Discussed status with Dr. Posey Pronto on 03/13/19 at 2:39 PM and received telephone approval for admission today.  Admission Coordinator:  Michel Santee, time 2:39 PM Sudie Grumbling 03/13/19

## 2019-03-13 NOTE — Care Management Note (Signed)
Case Management Note  Patient Details  Name: [name] MRN: [id number] Date of Birth: [birth year]  Subjective/Objective:  Admitted with infected R chronic open ankle fx.        Hx of systolic CHF and severe diabetes s/p a fall > R ankle fracture 2/19. From home with daughter.           S/p R BKA, 03/06/2019  Action/Plan: Transition to CIR.  Expected Discharge Date:  03/13/19               Expected Discharge Plan:  [place]  In-House Referral:  Clinical Social Work  Discharge planning Services  CM Consult  Post Acute Care Choice:    Choice offered to:  NA  DME Arranged:  N/A DME Agency:  NA  HH Arranged:  NA HH Agency:  NA  Status of Service:  Completed, signed off  If discussed at [place] of Stay Meetings, dates discussed:    Additional Comments:  Sharin Mons, RN 03/13/2019, 2:42 PM

## 2019-03-13 NOTE — Care Management Important Message (Signed)
Important Message  Patient Details  Name: [name] MRN: [id number] Date of Birth: 1951-04-27   Medicare Important Message Given:  Yes    [name] 03/13/2019, 4:24 PM

## 2019-03-13 NOTE — Progress Notes (Signed)
CSW notes patient will discharge to CIR when bed available.   CSW signing off.   Percell Locus Lucas Winograd LCSW [phone]

## 2019-03-13 NOTE — H&P (Signed)
Physical Medicine and Rehabilitation Admission H&P    Chief complaint: stump pain  HPI: [name]  Is a 68 year old right-handed female with history of diabetes mellitus maintained on Lantus insulin 60 units twice a day, diastolic congestive heart failure maintained on Lasix 40 mg daily, CAD with stenting 2008 maintained on Plavix followed by cardiology services Dr. Chancy Milroy, panic attacks. Per chart review and patient, patient lives with daughter and assistance as needed. One level home with 2 steps to entry. Independent prior to admission. Presented 03/06/2019 after a recent fall 02/15/2019 sustaining a right ankle fracture followed by orthopedic services. She was initially placed in a splint as an outpatient after undergoing reduction in the emergency room and referred to orthopedic services. She had another fall, did not seek medical help. Noted bleeding through the splint after most recent fall and again did not pursue medical attention. Upon evaluation noted extremely foul odor and the tibia was exposed. 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 Mardelle Matte. Postoperative hypotension required intubation maintained on pressors. Findings of elevated troponin with cardiology services follow-up. Echocardiogram with ejection fraction 25% severely reduced systolic function. No intervention required remained on aspirin and Plavix therapy as well as initially intravenous heparin. Patient did undergo gentle diuresis close monitoring of renal function  1.8. Follow critical care medicine for septic shock in the setting of right ankle infection. She was initially maintained on ProAmatine for orthostasis. Acute blood loss anemia 7.8-8.2.Subcutaneous heparin for DVT prophylaxis. Therapy evaluations completed with recommendations of physical medicine rehabilitation consult. Patient was admitted for a compress rehabilitation program.  Review of Systems  Constitutional:  Negative for chills.  HENT: Negative for hearing loss.   Eyes: Negative for blurred vision and double vision.  Respiratory: Negative for cough and shortness of breath.   Cardiovascular: Positive for leg swelling. Negative for chest pain and palpitations.  Gastrointestinal: Positive for constipation. Negative for heartburn, nausea and vomiting.  Genitourinary: Negative for dysuria, flank pain and hematuria.  Musculoskeletal: Positive for joint pain and myalgias.  Skin: Negative for rash.  Neurological: Negative for loss of consciousness.  Psychiatric/Behavioral:       Panic attacks  All other systems reviewed and are negative.  Past Medical History:  Diagnosis Date  . Below-knee amputation of right lower extremity ([place]) 03/06/2019  . 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 CHF (congestive heart failure) ([place])   . Complication of anesthesia   . Coronary artery disease   . Diabetes mellitus without complication ([place])    type 2   . Fracture    right ankle  . Fracture of humerus, proximal, left, closed 02/23/2014  . Full dentures   . Myocardial infarct ([place])   . Neuropathic pain   . Panic attacks    Hx: of  . PONV (postoperative nausea and vomiting)   . Wears glasses    Past Surgical History:  Procedure Laterality Date  . APPENDECTOMY    . BREAST BIOPSY Left 02/12/15   benign, clip placed  . BREAST SURGERY Right 2012   Right mastectomy with SLN, DCIS, grade 1-2. ER/PR not performed.   Marland Kitchen CARDIAC CATHETERIZATION    . cardiac stents  2007  . CHOLECYSTECTOMY    . COLON SURGERY     Subtotal colectomy for colonic inertia.  . COLONOSCOPY    . DILATION AND CURETTAGE OF UTERUS    . EYE SURGERY    . LEFT HEART CATH AND CORONARY  ANGIOGRAPHY Left 01/14/2018   Procedure: LEFT HEART CATH AND CORONARY ANGIOGRAPHY;  Surgeon: Dionisio David, MD;  Location: [facility];  Service: Cardiovascular;  Laterality: Left;  . LEFT HEART CATH AND CORONARY ANGIOGRAPHY N/A 03/10/2019   Procedure: LEFT  HEART CATH AND CORONARY ANGIOGRAPHY;  Surgeon: Larey Dresser, MD;  Location: [facility];  Service: Cardiovascular;  Laterality: N/A;  . MASTECTOMY Right 2012   Hx: of right breast, DCIS   . MULTIPLE TOOTH EXTRACTIONS    . ORIF ANKLE FRACTURE Right 03/06/2019   Procedure: RIGHT BELOW KNEE AMPUTATION;  Surgeon: Marchia Bond, MD;  Location: [facility];  Service: Orthopedics;  Laterality: Right;  . ORIF HUMERUS FRACTURE Left 02/23/2014   Procedure: OPEN REDUCTION INTERNAL FIXATION (ORIF) PROXIMAL HUMERUS FRACTURE;  Surgeon: Johnny Bridge, MD;  Location: [facility];  Service: Orthopedics;  Laterality: Left;  . TUBAL LIGATION     Family History  Problem Relation Age of Onset  . Diabetes Mother   . Hypertension Mother   . Stroke Mother   . Diabetes Brother   . Breast cancer Neg Hx    Social History:  reports that she has never smoked. She has never used smokeless tobacco. She reports that she does not drink alcohol or use drugs. Allergies:  Allergies  Allergen Reactions  . Cymbalta [Duloxetine Hcl] Nausea And Vomiting and Other (See Comments)    Vomiting and stiff muscles in legs  . Toradol [Ketorolac Tromethamine] Hives   Medications Prior to Admission  Medication Sig Dispense Refill  . acetaminophen (TYLENOL) 500 MG tablet Take 1,000 mg by mouth every 6 (six) hours as needed for moderate pain or headache.    Marland Kitchen atorvastatin (LIPITOR) 40 MG tablet Take 40 mg by mouth at bedtime.    . Cholecalciferol (VITAMIN D) 2000 units CAPS Take 2,000 Units by mouth daily.     . clonazePAM (KLONOPIN) 0.5 MG tablet Take 0.5 mg by mouth daily as needed for anxiety.     . clopidogrel (PLAVIX) 75 MG tablet Take 75 mg by mouth daily with breakfast.    . cyclobenzaprine (FLEXERIL) 10 MG tablet Take 1 tablet (10 mg total) by mouth 2 (two) times daily. (Patient taking differently: Take 10 mg by mouth at bedtime. ) 60 tablet 5  . diazepam (VALIUM) 5 MG tablet Take 1 tablet (5 mg total) by mouth every 8 (eight)  hours as needed for muscle spasms. 20 tablet 0  . diphenoxylate-atropine (LOMOTIL) 2.5-0.025 MG tablet Take 1 tablet by mouth 4 (four) times daily as needed for diarrhea or loose stools.     . furosemide (LASIX) 40 MG tablet Take 40 mg by mouth daily.    Marland Kitchen gabapentin (NEURONTIN) 600 MG tablet Take 1 tablet (600 mg total) by mouth 3 (three) times daily. 90 tablet 5  . insulin aspart (NOVOLOG) 100 UNIT/ML injection Inject 20-60 Units into the skin 3 (three) times daily before meals. Per sliding scale    . insulin glargine (LANTUS) 100 UNIT/ML injection Inject 60 Units into the skin 2 (two) times daily.    . metoCLOPramide (REGLAN) 10 MG tablet Take 10 mg by mouth at bedtime.     . metolazone (ZAROXOLYN) 2.5 MG tablet Take 2.5 mg by mouth daily.    Marland Kitchen oxyCODONE-acetaminophen (PERCOCET) 7.5-325 MG tablet Take 1 tablet by mouth every 4 (four) hours as needed for severe pain. 20 tablet 0  . pantoprazole (PROTONIX) 40 MG tablet Take 40 mg by mouth daily.     Marland Kitchen  sacubitril-valsartan (ENTRESTO) 24-26 MG Take 1 tablet by mouth 2 (two) times daily.     . traMADol (ULTRAM) 50 MG tablet Take 2 tablets (100 mg total) by mouth every 8 (eight) hours. 540 tablet 0  . dicyclomine (BENTYL) 10 MG capsule Take 1 capsule (10 mg total) by mouth 3 (three) times daily with meals as needed for spasms. (Patient not taking: Reported on 02/21/2019) 15 capsule 0  . ondansetron (ZOFRAN) 4 MG tablet Take 4 mg by mouth every 8 (eight) hours as needed for nausea or vomiting.       Drug Regimen Review Drug regimen was reviewed and remains appropriate with no significant issues identified  Home: Home Living Family/patient expects to be discharged to:: Private residence Living Arrangements: Children Available Help at Discharge: Available 24 hours/day, Family Type of Home: House Home Access: Stairs to enter Technical brewer of Steps: 2 Entrance Stairs-Rails: None Home Layout: One level Bathroom Shower/Tub: Tub/shower  unit, Architectural technologist: Standard Bathroom Accessibility: No Home Equipment: Toilet riser, Crutches, Environmental consultant - standard   Functional History: Prior Function Level of Independence: Independent Comments: drives;   Functional Status:  Mobility: Bed Mobility Overal bed mobility: Needs Assistance Bed Mobility: Supine to Sit Rolling: Supervision Sidelying to sit: Mod assist Supine to sit: Mod assist, HOB elevated Sit to supine: Mod assist, +2 for physical assistance General bed mobility comments: +rail, cues for sequencing, assist to elevate trunk and scoot to EOB Transfers Overall transfer level: Needs assistance Equipment used: Rolling walker (2 wheeled) Transfers: Sit to/from Stand, Lateral/Scoot Transfers Sit to Stand: Mod assist, +2 physical assistance  Lateral/Scoot Transfers: +2 physical assistance, Mod assist General transfer comment: Pt able to clear bottom from bed for sit to stand with RW but unable to attain full upright stance. Lateral/scoot transfer toward L using bed pad to assist. Cues for sequencing. Pt with good participation.  Ambulation/Gait General Gait Details: unable     ADL: ADL Overall ADL's : Needs assistance/impaired Grooming: Set up, Bed level Upper Body Bathing: Moderate assistance, Sitting Lower Body Bathing: Maximal assistance, Sitting/lateral leans Upper Body Dressing : Moderate assistance, Sitting Lower Body Dressing: Maximal assistance, Sitting/lateral leans Toilet Transfer: +2 for physical assistance, Moderate assistance Toileting- Clothing Manipulation and Hygiene: Total assistance Functional mobility during ADLs: Moderate assistance, +2 for physical assistance(sit - stand only) General ADL Comments: Movement patterns in preparation for LB ADL at bed level. HOB elevated.Pt  unable to place LLE ontop of knee. Will most likely need to use AE. Unable to feel L foot. Began education regarding residual limb desensitization.   Cognition:  Cognition Overall Cognitive Status: Within Functional Limits for tasks assessed Orientation Level: Oriented X4 Cognition Arousal/Alertness: Awake/alert Behavior During Therapy: WFL for tasks assessed/performed Overall Cognitive Status: Within Functional Limits for tasks assessed  Physical Exam: Blood pressure (!) 104/58, pulse 79, temperature 98.1 F (36.7 C), resp. rate 18, height _0  (1.753 m), weight 91 kg, SpO2 96 %. Physical Exam  Constitutional: She appears well-developed and well-nourished.  HENT:  Head: Normocephalic and atraumatic.  Eyes: EOM are normal. Right eye exhibits no discharge. Left eye exhibits no discharge.  Neck: Normal range of motion. Neck supple.  Cardiovascular: Normal rate and regular rhythm.  Respiratory: Effort normal and breath sounds normal.  + [place]  GI: Soft. Bowel sounds are normal.  Musculoskeletal:     Comments: Right stump edema with tenderness  Neurological: She is alert.  She follows full commands. Keeps eyes closed Motor: Bilateral upper extremities: 4-/5 proximal distal  Left lower extremity: 4/5 proximal distal Right lower extremity: Hip flexion 3/5 (pain inhibition) Sensation diminished to light touch left foot  Skin:  Right stump with dressing CD/I  Psychiatric: Her affect is blunt. She is withdrawn.    Results for orders placed or performed during the hospital encounter of 03/06/19 (from the past 48 hour(s))  Glucose, capillary     Status: Abnormal   Collection Time: 03/11/19 11:40 AM  Result Value Ref Range   Glucose-Capillary 130 (H) 70 - 99 mg/dL  Glucose, capillary     Status: Abnormal   Collection Time: 03/11/19  3:55 PM  Result Value Ref Range   Glucose-Capillary 134 (H) 70 - 99 mg/dL  Glucose, capillary     Status: Abnormal   Collection Time: 03/11/19 10:05 PM  Result Value Ref Range   Glucose-Capillary 113 (H) 70 - 99 mg/dL  .Cooxemetry Panel (carboxy, met, total hgb, O2 sat)     Status: Abnormal   Collection Time:  03/12/19  3:40 AM  Result Value Ref Range   Total hemoglobin 8.3 (L) 12.0 - 16.0 g/dL   O2 Saturation 78.5 %   Carboxyhemoglobin 1.7 (H) 0.5 - 1.5 %   Methemoglobin 1.8 (H) 0.0 - 1.5 %  CBC     Status: Abnormal   Collection Time: 03/12/19  3:51 AM  Result Value Ref Range   WBC 11.4 (H) 4.0 - 10.5 K/uL   RBC 3.06 (L) 3.87 - 5.11 MIL/uL   Hemoglobin 8.2 (L) 12.0 - 15.0 g/dL   HCT 26.6 (L) 36.0 - 46.0 %   MCV 86.9 80.0 - 100.0 fL   MCH 26.8 26.0 - 34.0 pg   MCHC 30.8 30.0 - 36.0 g/dL   RDW 17.2 (H) 11.5 - 15.5 %   Platelets 328 150 - 400 K/uL   nRBC 0.3 (H) 0.0 - 0.2 %    Comment: Performed at [facility], [address], [place], [place] [address]  Basic metabolic panel     Status: Abnormal   Collection Time: 03/12/19  3:51 AM  Result Value Ref Range   Sodium 132 (L) 135 - 145 mmol/L   Potassium 4.8 3.5 - 5.1 mmol/L   Chloride 92 (L) 98 - 111 mmol/L   CO2 27 22 - 32 mmol/L   Glucose, Bld 150 (H) 70 - 99 mg/dL   BUN 30 (H) 8 - 23 mg/dL   Creatinine, Ser 1.45 (H) 0.44 - 1.00 mg/dL   Calcium 8.7 (L) 8.9 - 10.3 mg/dL   GFR calc non Af Amer 37 (L) >60 mL/min   GFR calc Af Amer 42 (L) >60 mL/min   Anion gap 13 5 - 15    Comment: Performed at [facility] [address] [place], [place] [address]  Magnesium     Status: None   Collection Time: 03/12/19  3:51 AM  Result Value Ref Range   Magnesium 1.9 1.7 - 2.4 mg/dL    Comment: Performed at [facility] [address] [place], [place] [address]  Phosphorus     Status: None   Collection Time: 03/12/19  3:51 AM  Result Value Ref Range   Phosphorus 3.8 2.5 - 4.6 mg/dL    Comment: Performed at [facility] [address] [place], [place] [address]  Glucose, capillary     Status: Abnormal   Collection Time: 03/12/19  7:43 AM  Result Value Ref Range   Glucose-Capillary 160 (H) 70 - 99 mg/dL  Glucose, capillary     Status: Abnormal   Collection Time: 03/12/19 11:26 AM  Result Value Ref Range    Glucose-Capillary 179 (H) 70 - 99 mg/dL  Glucose, capillary     Status: Abnormal   Collection Time: 03/12/19  6:17 PM  Result Value Ref Range   Glucose-Capillary 156 (H) 70 - 99 mg/dL  Glucose, capillary     Status: Abnormal   Collection Time: 03/12/19  9:24 PM  Result Value Ref Range   Glucose-Capillary 168 (H) 70 - 99 mg/dL  Glucose, capillary     Status: Abnormal   Collection Time: 03/13/19 12:28 AM  Result Value Ref Range   Glucose-Capillary 160 (H) 70 - 99 mg/dL  Glucose, capillary     Status: Abnormal   Collection Time: 03/13/19  5:24 AM  Result Value Ref Range   Glucose-Capillary 114 (H) 70 - 99 mg/dL  Glucose, capillary     Status: Abnormal   Collection Time: 03/13/19  7:44 AM  Result Value Ref Range   Glucose-Capillary 120 (H) 70 - 99 mg/dL  Cooxemetry Panel (carboxy, met, total hgb, O2 sat)     Status: Abnormal   Collection Time: 03/13/19  9:35 AM  Result Value Ref Range   Total hemoglobin 9.6 (L) 12.0 - 16.0 g/dL   O2 Saturation 52.6 %   Carboxyhemoglobin 1.5 0.5 - 1.5 %   Methemoglobin 2.1 (H) 0.0 - 1.5 %   No results found.     Medical Problem List and Plan: 1.  Decreased functional mobility secondary to right BKA 15/17/6160 complicated by perioperative non-STEMI/septic shock  Admit to CIR 2.  Antithrombotics: -DVT/anticoagulation:  Subcutaneous heparin  -antiplatelet therapy:  Aspirin 81 mg daily, Plavix 75 mg daily 3. Pain Management:  Neurontin 600 mg 3 times a day,tramadol 100 mg 3 times a day, oxycodone as needed 4. Mood: Klonopin 0.5 mg twice a day as needed  -antipsychotic agents: see above 5. Neuropsych: This patient is capable of making decisions on his own behalf. 6. Skin/Wound Care:  Routine skin checks 7. Fluids/Electrolytes/Nutrition: routine and out's with follow-up chemistries tomorrow a.m. 8. Acute blood loss anemia. Follow-up CBC tomorrow a.m. 9. 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 Monitor for any signs of  fluid overload. Lanoxin 0.125 mg daily, Aldactone 12.5 mg daily, Demadex 40 mg daily. Patient is off ProAmatine.  Daily weights 10. Diabetes mellitus with peripheral neuropathy. Hemoglobin A1c 7.8. Lantus insulin 60 units twice a day, NovoLog 7 units 3 times a day.  Monitor with increased mobility 11. Hyperlipidemia. Lipitor 12.  Supplemental oxygen dependent  Wean as tolerated  Post Admission Physician Evaluation: 1. Preadmission assessment reviewed and changes made below. 2. Functional deficits secondary  to right BKA. 3. Patient is admitted to receive collaborative, interdisciplinary care between the physiatrist, rehab nursing staff, and therapy team. 4. Patient's level of medical complexity and substantial therapy needs in context of that medical necessity cannot be provided at a lesser intensity of care such as a SNF. 5. Patient has experienced substantial functional loss from his/her baseline which was documented above under the "Functional History" and "Functional Status" headings.  Judging by the patient's diagnosis, physical exam, and functional history, the patient has potential for functional progress which will result in measurable gains while on inpatient rehab.  These gains will be of substantial and practical use upon discharge  in facilitating mobility and self-care at the household level. 6. Physiatrist will provide 24 hour management of medical needs as well  as oversight of the therapy plan/treatment and provide guidance as appropriate regarding the interaction of the two. 7. 24 hour rehab nursing will assist with bowel management, safety, skin/wound care, disease management, pain management and patient education  and help integrate therapy concepts, techniques,education, etc. 8. PT will assess and treat for/with: Lower extremity strength, range of motion, stamina, balance, functional mobility, safety, adaptive techniques and equipment, wound care, coping skills, pain control,  education. Goals are: Supervision. 9. OT will assess and treat for/with: ADL's, functional mobility, safety, upper extremity strength, adaptive techniques and equipment, wound mgt, ego support, and community reintegration.   Goals are: Supervision. Therapy may not proceed with showering this patient. 10. Case Management and Social Worker will assess and treat for psychological issues and discharge planning. 11. Team conference will be held weekly to assess progress toward goals and to determine barriers to discharge. 12. Patient will receive at least 3 hours of therapy per day at least 5 days per week. 13. ELOS: 11-16 days.       14. Prognosis:  good  I have personally performed a face to face diagnostic evaluation, including, but not limited to relevant history and physical exam findings, of this patient and developed relevant assessment and plan.  Additionally, I have reviewed and concur with the physician assistant's documentation above.  Delice Lesch, MD, ABPMR Lavon Paganini Angiulli, PA-C 03/13/2019

## 2019-03-14 ENCOUNTER — Other Ambulatory Visit: Payer: Self-pay

## 2019-03-14 ENCOUNTER — Inpatient Hospital Stay (HOSPITAL_COMMUNITY): Payer: Medicare Other | Admitting: Occupational Therapy

## 2019-03-14 ENCOUNTER — Inpatient Hospital Stay (HOSPITAL_COMMUNITY): Payer: Medicare Other | Admitting: Physical Therapy

## 2019-03-14 DIAGNOSIS — E876 Hypokalemia: Secondary | ICD-10-CM

## 2019-03-14 DIAGNOSIS — E1169 Type 2 diabetes mellitus with other specified complication: Secondary | ICD-10-CM

## 2019-03-14 DIAGNOSIS — N179 Acute kidney failure, unspecified: Secondary | ICD-10-CM

## 2019-03-14 DIAGNOSIS — D62 Acute posthemorrhagic anemia: Secondary | ICD-10-CM

## 2019-03-14 DIAGNOSIS — I5023 Acute on chronic systolic (congestive) heart failure: Secondary | ICD-10-CM

## 2019-03-14 DIAGNOSIS — M869 Osteomyelitis, unspecified: Secondary | ICD-10-CM

## 2019-03-14 DIAGNOSIS — Z89511 Acquired absence of right leg below knee: Secondary | ICD-10-CM

## 2019-03-14 DIAGNOSIS — R7309 Other abnormal glucose: Secondary | ICD-10-CM

## 2019-03-14 DIAGNOSIS — E162 Hypoglycemia, unspecified: Secondary | ICD-10-CM

## 2019-03-14 DIAGNOSIS — E1142 Type 2 diabetes mellitus with diabetic polyneuropathy: Secondary | ICD-10-CM

## 2019-03-14 LAB — GLUCOSE, CAPILLARY
Glucose-Capillary: 39 mg/dL — CL (ref 70–99)
Glucose-Capillary: 80 mg/dL (ref 70–99)
Glucose-Capillary: 184 mg/dL — ABNORMAL HIGH (ref 70–99)
Glucose-Capillary: 135 mg/dL — ABNORMAL HIGH (ref 70–99)
Glucose-Capillary: 156 mg/dL — ABNORMAL HIGH (ref 70–99)

## 2019-03-14 LAB — COMPREHENSIVE METABOLIC PANEL
ALT: 19 U/L (ref 0–44)
AST: 32 U/L (ref 15–41)
Albumin: 2.5 g/dL — ABNORMAL LOW (ref 3.5–5.0)
Alkaline Phosphatase: 97 U/L (ref 38–126)
Anion gap: 12 (ref 5–15)
BUN: 34 mg/dL — AB (ref 8–23)
CO2: 30 mmol/L (ref 22–32)
Calcium: 9.1 mg/dL (ref 8.9–10.3)
Chloride: 92 mmol/L — ABNORMAL LOW (ref 98–111)
Creatinine, Ser: 1.7 mg/dL — ABNORMAL HIGH (ref 0.44–1.00)
GFR calc Af Amer: 35 mL/min — ABNORMAL LOW (ref >60)
GFR, EST NON AFRICAN AMERICAN: 30 mL/min — AB (ref >60)
Glucose, Bld: 129 mg/dL — ABNORMAL HIGH (ref 70–99)
Potassium: 3.4 mmol/L — ABNORMAL LOW (ref 3.5–5.1)
Sodium: 134 mmol/L — ABNORMAL LOW (ref 135–145)
Total Bilirubin: 1 mg/dL (ref 0.3–1.2)
Total Protein: 6.8 g/dL (ref 6.5–8.1)

## 2019-03-14 LAB — CBC WITH DIFFERENTIAL/PLATELET
Abs Immature Granulocytes: 0.17 10*3/uL — ABNORMAL HIGH (ref 0.00–0.07)
Basophils Absolute: 0 10*3/uL (ref 0.0–0.1)
Basophils Relative: 0 %
Eosinophils Absolute: 0.2 10*3/uL (ref 0.0–0.5)
Eosinophils Relative: 2 %
HCT: 27.3 % — ABNORMAL LOW (ref 36.0–46.0)
Hemoglobin: 8 g/dL — ABNORMAL LOW (ref 12.0–15.0)
Immature Granulocytes: 1 %
Lymphocytes Relative: 12 %
Lymphs Abs: 1.6 10*3/uL (ref 0.7–4.0)
MCH: 25.6 pg — ABNORMAL LOW (ref 26.0–34.0)
MCHC: 29.3 g/dL — ABNORMAL LOW (ref 30.0–36.0)
MCV: 87.5 fL (ref 80.0–100.0)
MONOS PCT: 8 %
Monocytes Absolute: 1 10*3/uL (ref 0.1–1.0)
Neutro Abs: 9.7 10*3/uL — ABNORMAL HIGH (ref 1.7–7.7)
Neutrophils Relative %: 77 %
Platelets: 336 10*3/uL (ref 150–400)
RBC: 3.12 MIL/uL — ABNORMAL LOW (ref 3.87–5.11)
RDW: 17.1 % — ABNORMAL HIGH (ref 11.5–15.5)
WBC: 12.6 10*3/uL — ABNORMAL HIGH (ref 4.0–10.5)
nRBC: 0.5 % — ABNORMAL HIGH (ref 0.0–0.2)

## 2019-03-14 MED ORDER — TORSEMIDE 20 MG PO TABS
40.0000 mg | ORAL_TABLET | Freq: Every day | ORAL | Status: DC
  Administered 2019-03-14 – 2019-03-30 (×17): 40 mg via ORAL
  Filled 2019-03-14 (×18): qty 2

## 2019-03-14 MED ORDER — SODIUM CHLORIDE 0.9% FLUSH
10.0000 mL | Freq: Two times a day (BID) | INTRAVENOUS | Status: DC
  Administered 2019-03-14 – 2019-03-17 (×5): 10 mL

## 2019-03-14 MED ORDER — POTASSIUM CHLORIDE CRYS ER 20 MEQ PO TBCR
30.0000 meq | EXTENDED_RELEASE_TABLET | Freq: Two times a day (BID) | ORAL | Status: AC
  Administered 2019-03-14 (×2): 30 meq via ORAL
  Filled 2019-03-14 (×2): qty 1

## 2019-03-14 MED ORDER — INSULIN GLARGINE 100 UNIT/ML ~~LOC~~ SOLN
30.0000 [IU] | Freq: Two times a day (BID) | SUBCUTANEOUS | Status: DC
  Administered 2019-03-14 – 2019-03-15 (×2): 30 [IU] via SUBCUTANEOUS
  Filled 2019-03-14 (×2): qty 0.3

## 2019-03-14 MED ORDER — SODIUM CHLORIDE 0.9% FLUSH: 10.0000 mL | INTRAVENOUS | Status: DC | PRN

## 2019-03-14 NOTE — Progress Notes (Signed)
Patient complaint of being short of breath. She claims that she has been having SOB all day. Oxygen saturation 97% on room air. Patient requested for oxygen as that will make her feel better. Dan A. Notified and order received for oxygen 1 liter/min. For tonight only. Will monitor.

## 2019-03-14 NOTE — Evaluation (Signed)
Physical Therapy Assessment and Plan  Patient Details  Name: [name] MRN: [id number] Date of Birth: Aug 17, 1951  PT Diagnosis: Difficulty walking, Impaired sensation, Muscle weakness and Pain in Rt LE Rehab Potential: Good ELOS: 14-17 days   Today's Date: 03/14/2019 PT Individual Time: 0830-0930 PT Individual Time Calculation (min): 60 min    Problem List:  Patient Active Problem List   Diagnosis Date Noted  . AKI (acute kidney injury) ([place])   . Diabetic osteomyelitis ([place])   . Hypokalemia   . Diabetic peripheral neuropathy ([place])   . Labile blood glucose   . Hypoglycemia   . Acute on chronic systolic heart failure ([place])   . Right below-knee amputee ([place]) 03/13/2019  . Supplemental oxygen dependent   . Postoperative pain   . Acute blood loss anemia   . Acute on chronic systolic (congestive) heart failure ([place])   . Orthostasis   . Diabetes mellitus type 2 in obese ([place])   . Dyslipidemia   . Non-STEMI (non-ST elevated myocardial infarction) ([place])   . Encounter for central line placement   . Below-knee amputation of right lower extremity ([place]) 03/06/2019  . S/P below knee amputation, right ([place]) 03/06/2019  . Hypoxemia   . Shock circulatory ([place])   . Acute pain   . Acute encephalopathy   . Gastroparesis due to DM ([place]) 04/21/2016  . DM type 2 with diabetic peripheral neuropathy ([place]) 03/12/2016  . 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 Peripheral polyneuropathy 11/14/2014  . Fracture of humerus, proximal, left, closed 02/23/2014  . Proximal humerus fracture 02/23/2014    Past Medical History:  Past Medical History:  Diagnosis Date  . Below-knee amputation of right lower extremity ([place]) 03/06/2019  . 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 CHF (congestive heart failure) ([place])   . Complication of anesthesia   . Coronary artery disease   . Diabetes mellitus without complication ([place])    type 2   . Fracture    right ankle  . Fracture of humerus, proximal, left,  closed 02/23/2014  . Full dentures   . Myocardial infarct ([place])   . Neuropathic pain   . Panic attacks    Hx: of  . PONV (postoperative nausea and vomiting)   . Wears glasses    Past Surgical History:  Past Surgical History:  Procedure Laterality Date  . APPENDECTOMY    . BREAST BIOPSY Left 02/12/15   benign, clip placed  . BREAST SURGERY Right 2012   Right mastectomy with SLN, DCIS, grade 1-2. ER/PR not performed.   Marland Kitchen CARDIAC CATHETERIZATION    . cardiac stents  2007  . CHOLECYSTECTOMY    . COLON SURGERY     Subtotal colectomy for colonic inertia.  . COLONOSCOPY    . DILATION AND CURETTAGE OF UTERUS    . EYE SURGERY    . LEFT HEART CATH AND CORONARY ANGIOGRAPHY Left 01/14/2018   Procedure: LEFT HEART CATH AND CORONARY ANGIOGRAPHY;  Surgeon: Dionisio David, MD;  Location: [facility];  Service: Cardiovascular;  Laterality: Left;  . LEFT HEART CATH AND CORONARY ANGIOGRAPHY N/A 03/10/2019   Procedure: LEFT HEART CATH AND CORONARY ANGIOGRAPHY;  Surgeon: Larey Dresser, MD;  Location: [facility];  Service: Cardiovascular;  Laterality: N/A;  . MASTECTOMY Right 2012   Hx: of right breast, DCIS   . MULTIPLE TOOTH EXTRACTIONS    . ORIF ANKLE FRACTURE Right 03/06/2019   Procedure: RIGHT BELOW KNEE AMPUTATION;  Surgeon:  Marchia Bond, MD;  Location: [facility];  Service: Orthopedics;  Laterality: Right;  . ORIF HUMERUS FRACTURE Left 02/23/2014   Procedure: OPEN REDUCTION INTERNAL FIXATION (ORIF) PROXIMAL HUMERUS FRACTURE;  Surgeon: Johnny Bridge, MD;  Location: [facility];  Service: Orthopedics;  Laterality: Left;  . TUBAL LIGATION      Assessment & Plan Clinical Impression: Patient is a 68 y.o. year old female with recent admission to the hospital on 03/06/2019 after a recent fall 02/15/2019 sustaining a right ankle fracture followed by orthopedic services. She was initially placed in a splint as an outpatient after undergoing reduction in the emergency room and referred to  orthopedic services. She had another fall, did not seek medical help. Noted bleeding through the splint after most recent fall and again did not pursue medical attention. Upon evaluation noted extremely foul odor and the tibia was exposed. 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 Mardelle Matte. Postoperative hypotension required intubation maintained on pressors. Findings of elevated troponin with cardiology services follow-up. Echocardiogram with ejection fraction 25% severely reduced systolic function. No intervention required remained on aspirin and Plavix therapy as well as initially intravenous heparin. Patient did undergo gentle diuresis close monitoring of renal function  1.8. Follow critical care medicine for septic shock in the setting of right ankle infection. She was initially maintained on ProAmatine for orthostasis. Acute blood loss anemia 7.8-8.2.Subcutaneous heparin for DVT prophylaxis.  Patient transferred to CIR on 03/13/2019 .   Patient currently requires max with mobility secondary to muscle weakness and decreased standing balance, decreased postural control and decreased balance strategies.  Prior to hospitalization, patient was independent  with mobility and lived with Daughter in a House home.  Home access is 2Stairs to enter.  Patient will benefit from skilled PT intervention to maximize safe functional mobility, minimize fall risk and decrease caregiver burden for planned discharge home with 24 hour supervision.  Anticipate patient will benefit from follow up [place] at discharge.  PT - End of Session Activity Tolerance: Tolerates 30+ min activity with multiple rests Endurance Deficit: Yes PT Assessment Rehab Potential (ACUTE/IP ONLY): Good PT Barriers to Discharge: Inaccessible home environment PT Patient demonstrates impairments in the following area(s): Balance;Edema;Endurance;Motor;Pain;Sensory;Safety PT Transfers Functional Problem(s): Bed Mobility;Bed  to Chair;Furniture;Car PT Locomotion Functional Problem(s): Stairs;Wheelchair Mobility;Ambulation PT Plan PT Intensity: Minimum of 1-2 x/day ,45 to 90 minutes PT Frequency: 5 out of 7 days PT Duration Estimated Length of Stay: 14-17 days PT Treatment/Interventions: Ambulation/gait training;Community reintegration;DME/adaptive equipment instruction;Neuromuscular re-education;Stair training;UE/LE Strength taining/ROM;Wheelchair propulsion/positioning;Balance/vestibular training;Discharge planning;Pain management;Therapeutic Activities;UE/LE Coordination activities;Functional mobility training;Patient/family education;Splinting/orthotics;Therapeutic Exercise PT Transfers Anticipated Outcome(s): supervision PT Locomotion Anticipated Outcome(s): supervisoin w/c level PT Recommendation Recommendations for Other Services: Neuropsych consult Follow Up Recommendations: Home health PT Patient destination: Home Equipment Recommended: To be determined  Skilled Therapeutic Intervention Pt participated in skilled PT eval and was educated on PT POC and goals.  Pt anxious and resistant to participate in therapy, but with increased time and encouragement pt able to perform sit <> stand with RW and squat pivot transfers with max A.  Min A for w/c mobility with bilat UEs.  Pt performs standing balance with RW 5 x 30 seconds for strengthening and endurance.  Pt left in w/c with alarm set, needs at hand.  PT Evaluation Precautions/Restrictions Precautions Precautions: Fall Precaution Comments: watch BP Restrictions Weight Bearing Restrictions: Yes RLE Weight Bearing: Non weight bearing Other Position/Activity Restrictions: Pt R NWB with new BKA Pain Pain Assessment Pain  Scale: 0-10 Pain Score: 6  Pain Type: Surgical pain Pain Location: Leg Pain Orientation: Right Pain Descriptors / Indicators: Aching Pain Onset: On-going Pain Intervention(s): RN made aware;Repositioned;Rest Home Living/Prior  Functioning Home Living Living Arrangements: Children Available Help at Discharge: Available 24 hours/day;Family Type of Home: House Home Access: Stairs to enter Entrance Stairs-Number of Steps: 2 Entrance Stairs-Rails: None Home Layout: One level Bathroom Shower/Tub: Product/process development scientist: Standard Bathroom Accessibility: No Additional Comments: working on getting a ramp installed  Lives With: Daughter Prior Function Level of Independence: Independent with gait;Independent with transfers;Needs assistance with ADLs  Able to Take Stairs?: Yes Driving: No Comments: does not drive, daughter would assist pt with bathing at sink  Cognition Overall Cognitive Status: Within Functional Limits for tasks assessed Orientation Level: Oriented X4 Behaviors: Poor frustration tolerance Safety/Judgment: Impaired Sensation Sensation Light Touch: Impaired Detail Light Touch Impaired Details: Impaired RLE Proprioception: Appears Intact Coordination Gross Motor Movements are Fluid and Coordinated: Yes Fine Motor Movements are Fluid and Coordinated: Yes Motor  Motor Motor - Skilled Clinical Observations: generalized weakness  Mobility Bed Mobility Bed Mobility: Rolling Left;Rolling Right;Supine to Sit Rolling Right: Minimal Assistance - Patient > 75% Rolling Left: Minimal Assistance - Patient > 75% Supine to Sit: Maximal Assistance - Patient - Patient 25-49% Transfers Transfers: Sit to [employer] Transfers Sit to Stand: Maximal Assistance - Patient 25-49% Squat Pivot Transfers: Maximal Assistance - Patient 25-49% Transfer (Assistive device): None Locomotion  Gait Ambulation: No Gait Gait: No Stairs / Additional Locomotion Stairs: No Wheelchair Mobility Wheelchair Mobility: Yes Wheelchair Assistance: Minimal assistance - Patient >75% Wheelchair Propulsion: Both upper extremities Wheelchair Parts Management: Needs assistance  Trunk/Postural Assessment   Cervical Assessment Cervical Assessment: Within Functional Limits Thoracic Assessment Thoracic Assessment: Within Functional Limits Lumbar Assessment Lumbar Assessment: (posterior pelvic tilt) Postural Control Postural Control: Deficits on evaluation Righting Reactions: delayed  Balance Dynamic Sitting Balance Sitting balance - Comments: supervision sitting EOB Static Standing Balance Static Standing - Comment/# of Minutes: min A with RW Extremity Assessment      RLE Assessment Active Range of Motion (AROM) Comments: Rt LE in splint General Strength Comments: grossly 3-/5 LLE Assessment General Strength Comments: grossly 3/5    Refer to Care Plan for Long Term Goals  Recommendations for other services: Neuropsych  Discharge Criteria: Patient will be discharged from PT if patient refuses treatment 3 consecutive times without medical reason, if treatment goals not met, if there is a change in medical status, if patient makes no progress towards goals or if patient is discharged from hospital.  The above assessment, treatment plan, treatment alternatives and goals were discussed and mutually agreed upon: by patient  [facility] 03/14/2019, 10:57 AM

## 2019-03-14 NOTE — Progress Notes (Addendum)
Advanced Heart Failure Rounding Note  PCP-Cardiologist: No primary care provider on file.   Subjective:    Moved to CIR 03/13/19  Labs pending this am.  SBP in 100s.  Breathing is "fine". Denies CP. Having leg pain this am. Has asked for more pain medication. Worried about getting diuretics and potassium dropping and causing cramps. Re-assured her that she has been getting diuretics, and we are following her potassium daily.   LHC/RHC (3/14): Hemodynamics:  LV 101/39 Ao 92/50 RA 14 CO (Fick) 6.28 CI (Fick) 2.92 Diagnostic  Dominance: Right  Left Main  Minimal disease.  Left Anterior Descending  Large LAD wrapping around apex. There is a patent proximal LAD stent. Diffuse luminal irregularities. There was probably a diagonal originating from the mid LAD that was occluded [place].  Ramus Intermedius  Small to moderate ramus with 95% ostial/proximal stenosis.  Left Circumflex  50% stenosis mid LCx and 50% stenosis distal LCx. 50% stenosis in proximal small-moderate OM1.  Right Coronary Artery  50% stenosis distal RCA/ostial PLV.    Objective:   Weight Range: 92.7 kg Body mass index is 30.18 kg/m.   Vital Signs:   Temp:  [97.8 F (36.6 C)-98.9 F (37.2 C)] 97.8 F (36.6 C) (03/17 0600) Pulse Rate:  [68-74] 74 (03/17 0600) Resp:  [16-18] 16 (03/17 0600) BP: (105-106)/(49-60) 106/49 (03/17 0600) SpO2:  [98 %-100 %] 100 % (03/17 0600) Weight:  [92.7 kg] 92.7 kg (03/16 1721) Last BM Date: 03/13/19  Weight change: Filed Weights   03/13/19 1721  Weight: 92.7 kg   Intake/Output:  No intake or output data in the 24 hours ending 03/14/19 0808   Physical Exam    General: NAD  HEENT: Normal Neck: Supple. JVP 5-6. Carotids 2+ bilat; no bruits. No thyromegaly or nodule noted. Cor: PMI nondisplaced. RRR, No M/G/R noted Lungs: CTAB, normal effort. Abdomen: Soft, non-tender, non-distended, no HSM. No bruits or masses. +BS  Extremities: No cyanosis, clubbing, or  rash. R BKA.  Neuro: Alert & orientedx3, cranial nerves grossly intact. moves all 4 extremities w/o difficulty. Affect pleasant   Telemetry   Not connected; CIR  EKG    No new tracings.    Labs    CBC Recent Labs    03/12/19 0351 03/13/19 1126  WBC 11.4* 10.6*  HGB 8.2* 7.9*  HCT 26.6* 26.3*  MCV 86.9 87.1  PLT 328 086   Basic Metabolic Panel Recent Labs    03/12/19 0351 03/13/19 1126  NA 132* 133*  K 4.8 3.8  CL 92* 93*  CO2 27 30  GLUCOSE 150* 130*  BUN 30* 37*  CREATININE 1.45* 1.71*  CALCIUM 8.7* 9.2  MG 1.9  --   PHOS 3.8  --    Liver Function Tests Recent Labs    03/13/19 1126  AST 31  ALT 18  ALKPHOS 90  BILITOT 0.9  PROT 6.6  ALBUMIN 2.4*   No results for input(s): LIPASE, AMYLASE in the last 72 hours. Cardiac Enzymes No results for input(s): CKTOTAL, CKMB, CKMBINDEX, TROPONINI in the last 72 hours.  BNP: BNP (last 3 results) Recent Labs    02/09/19 1322  BNP 706.0*    ProBNP (last 3 results) No results for input(s): PROBNP in the last 8760 hours.   D-Dimer No results for input(s): DDIMER in the last 72 hours. Hemoglobin A1C No results for input(s): HGBA1C in the last 72 hours. Fasting Lipid Panel No results for input(s): CHOL, HDL, LDLCALC, TRIG, CHOLHDL, LDLDIRECT in  the last 72 hours. Thyroid Function Tests No results for input(s): TSH, T4TOTAL, T3FREE, THYROIDAB in the last 72 hours.  Invalid input(s): FREET3  Other results:   Imaging     No results [name].   Medications:     Scheduled Medications: . aspirin EC  81 mg Oral Daily  . atorvastatin  40 mg Oral QHS  . cholecalciferol  2,000 Units Oral Daily  . clopidogrel  75 mg Oral Daily  . digoxin  0.125 mg Oral Daily  . gabapentin  600 mg Oral TID  . heparin  5,000 Units Subcutaneous Q8H  . insulin aspart  0-15 Units Subcutaneous TID WC  . insulin aspart  7 Units Subcutaneous TID WC  . insulin glargine  60 Units Subcutaneous BID  . metoCLOPramide  10 mg  Oral QHS  . pantoprazole  40 mg Oral Daily  . polyethylene glycol  17 g Oral BID  . sodium chloride flush  10-40 mL Intracatheter Q12H  . spironolactone  12.5 mg Oral Daily  . torsemide  40 mg Oral Daily  . traMADol  100 mg Oral TID     Infusions:   PRN Medications:  acetaminophen, bisacodyl, clonazepam, clonazePAM, ondansetron **OR** ondansetron (ZOFRAN) IV, oxyCODONE, sodium chloride flush, sorbitol    Patient [name] a 68 y.o.femalewith h/o CAD s/p LAD stent, chronic systolic CHF, DM2, neuropathy, gastroparesis, anxiety, panic attacks, and breast cancer s/p right mastectomy.   Suffered an NSTEMI in setting of R BKA. Troponin peaked at 23. Dr. Gwenlyn [name] assessed and recommended no intervention at this time with relative lack of symptoms. CHF team consulted for med titration.  Transferred to CIR 03/13/19  Assessment/Plan   1. CAD: Patient has history of prior anterior MI with LAD stent. She developed hypotension but no significant ECG changes or chest pain post-op 3/9. Troponin up to 29. Suspect peri-op NSTEMI. LHC on 3/13 showed 95% stenosis ostial/proximal small-moderate ramus and suspected ostial diagonal occlusion.  One of these vessels may have been culprit lesion, no obstructive disease in the major coronaries, no interventional target (ramus too small for stent placement).  - No s/s of ischemia.    - Continue ASA and statin.  -Continue Plavix 75 mg daily. 2. Shock: Suspect mixed septic/cardiogenic. Septic shock with osteomyelitis, WBCstrending down and afebrile, cultures negative.  - Resolved. Now in CIR. Off midodrine.  - She has completed cefepime course.   3. Acute on chronic systolic CHF: Ischemic cardiomyopathy. Apparently, baseline EF is in the 25-30% range. Echo this admission with EF 20-25%. Volume status stable. No CVP. No coox drawn.  - Off midodrine  - Continue digoxin 0.125 daily. -Continue torsemide 40 mg daily  pending labwork.  - Continue spironolactone 12.5 mg daily.  4. AKI: Cr 1.71 03/13/19. Pending today. Will hold torsemide and start back if stable.  5. Anemia:  - FOBT+.  - Fe deficient. Given feraheme.  Further per primary.  - Hgb 7.9 03/13/19. No overt bleeding.  6. Osteomyelitis:  - s/p right BKA. Ortho following. Now in CIR.  Medication concerns reviewed with patient and pharmacy team. Barriers identified: Not at this time.   Length of Stay: 1  [name]  03/14/2019, 8:08 AM  Advanced Heart Failure Team Pager [phone] (M-F; 7a - 4p)  Please contact [facility] for night-coverage after hours (4p -7a ) and weekends on [email or web address]  Patient seen with PA, agree with the above note.   Creatinine stable today at 1.7, volume status  looks ok.  Continue torsemide 40 mg daily.  BP remains soft, will not titrate cardiac meds today.    [name] 03/14/2019 3:35 PM

## 2019-03-14 NOTE — Care Management Note (Signed)
Inpatient [facility] Statement of Services  Patient Name:  [name]  Date:  03/14/2019  Welcome to the [facility].  Our goal is to provide you with an individualized program based on your diagnosis and situation, designed to meet your specific needs.  With this comprehensive rehabilitation program, you will be expected to participate in at least 3 hours of rehabilitation therapies Monday-Friday, with modified therapy programming on the weekends.  Your rehabilitation program will include the following services:  Physical Therapy (PT), Occupational Therapy (OT), 24 hour per day rehabilitation nursing, Neuropsychology, Case Management (Social Worker), Rehabilitation Medicine, Nutrition Services and Pharmacy Services  Weekly team conferences will be held on Wednesday to discuss your progress.  Your Social Worker will talk with you frequently to get your input and to update you on team discussions.  Team conferences with you and your family in attendance may also be held.  Expected length of stay: 14-17 days  Overall anticipated outcome: supervision with cueing  Depending on your progress and recovery, your program may change. Your Social Worker will coordinate services and will keep you informed of any changes. Your Social Worker's name and contact numbers are listed  below.  The following services may also be recommended but are not provided by the [facility]:    [place] will be made to provide these services after discharge if needed.  Arrangements include referral to agencies that provide these services.  Your insurance has been verified to be:  Medicare & Tricare Your primary doctor is:  Lamonte Sakai  Pertinent information will be shared with your doctor and your insurance company.  Social Worker:  Ovidio Kin, [place] or  ([phone]  Information discussed with and copy given to patient by: Elease Hashimoto, 03/14/2019, 10:10 AM

## 2019-03-14 NOTE — Progress Notes (Signed)
Social Work  Social Work Assessment and Plan  Patient Details  Name: [name] MRN: [id number] Date of Birth: [birth year]  Today's Date: 03/14/2019  Problem List:  Patient Active Problem List   Diagnosis Date Noted  . AKI (acute kidney injury) ([place])   . Diabetic osteomyelitis ([place])   . Hypokalemia   . Diabetic peripheral neuropathy ([place])   . Labile blood glucose   . Hypoglycemia   . Acute on chronic systolic heart failure ([place])   . Right below-knee amputee ([place]) 03/13/2019  . Supplemental oxygen dependent   . Postoperative pain   . Acute blood loss anemia   . Acute on chronic systolic (congestive) heart failure ([place])   . Orthostasis   . Diabetes mellitus type 2 in obese ([place])   . Dyslipidemia   . Non-STEMI (non-ST elevated myocardial infarction) ([place])   . Encounter for central line placement   . Below-knee amputation of right lower extremity ([place]) 03/06/2019  . S/P below knee amputation, right ([place]) 03/06/2019  . Hypoxemia   . Shock circulatory ([place])   . Acute pain   . Acute encephalopathy   . Gastroparesis due to DM ([place]) 04/21/2016  . DM type 2 with diabetic peripheral neuropathy ([place]) 03/12/2016  . 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 Peripheral polyneuropathy 11/14/2014  . Fracture of humerus, proximal, left, closed 02/23/2014  . Proximal humerus fracture 02/23/2014   Past Medical History:  Past Medical History:  Diagnosis Date  . Below-knee amputation of right lower extremity ([place]) 03/06/2019  . 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 CHF (congestive heart failure) ([place])   . Complication of anesthesia   . Coronary artery disease   . Diabetes mellitus without complication ([place])    type 2   . Fracture    right ankle  . Fracture of humerus, proximal, left, closed 02/23/2014  . Full dentures   . Myocardial infarct ([place])   . Neuropathic pain   . Panic attacks    Hx: of  . PONV (postoperative nausea and vomiting)   . Wears glasses    Past Surgical History:   Past Surgical History:  Procedure Laterality Date  . APPENDECTOMY    . BREAST BIOPSY Left 02/12/15   benign, clip placed  . BREAST SURGERY Right 2012   Right mastectomy with SLN, DCIS, grade 1-2. ER/PR not performed.   Marland Kitchen CARDIAC CATHETERIZATION    . cardiac stents  2007  . CHOLECYSTECTOMY    . COLON SURGERY     Subtotal colectomy for colonic inertia.  . COLONOSCOPY    . DILATION AND CURETTAGE OF UTERUS    . EYE SURGERY    . LEFT HEART CATH AND CORONARY ANGIOGRAPHY Left 01/14/2018   Procedure: LEFT HEART CATH AND CORONARY ANGIOGRAPHY;  Surgeon: Dionisio David, MD;  Location: [facility];  Service: Cardiovascular;  Laterality: Left;  . LEFT HEART CATH AND CORONARY ANGIOGRAPHY N/A 03/10/2019   Procedure: LEFT HEART CATH AND CORONARY ANGIOGRAPHY;  Surgeon: Larey Dresser, MD;  Location: [facility];  Service: Cardiovascular;  Laterality: N/A;  . MASTECTOMY Right 2012   Hx: of right breast, DCIS   . MULTIPLE TOOTH EXTRACTIONS    . ORIF ANKLE FRACTURE Right 03/06/2019   Procedure: RIGHT BELOW KNEE AMPUTATION;  Surgeon: Marchia Bond, MD;  Location: [facility];  Service: Orthopedics;  Laterality: Right;  . ORIF HUMERUS FRACTURE Left 02/23/2014   Procedure: OPEN REDUCTION INTERNAL FIXATION (ORIF) PROXIMAL HUMERUS FRACTURE;  Surgeon: Johnny Bridge, MD;  Location: [facility];  Service: Orthopedics;  Laterality: Left;  . TUBAL LIGATION     Social History:  reports that she has never smoked. She has never used smokeless tobacco. She reports that she does not drink alcohol or use drugs.  Family / Support Systems Marital Status: Widow/Widower Patient Roles: Parent Children: Shelly [phone] Other Supports: Daphna-friend [phone]   Alma-friend [phone]-cell Anticipated Caregiver: Shelly-daughter Ability/Limitations of Caregiver: None Caregiver Availability: 24/7 Family Dynamics: Close with her daughter and friends, all will assist her if needed. She relies upon  them for emotional support. She feels she will do well but it will take time to heal and to get back on her feet again.  Social History Preferred language: English Religion: Patient Refused Cultural Background: No issues Education: High School Read: Yes Write: Yes Employment Status: Retired Public relations account executive Issues: No issues Guardian/Conservator: None-according to MD pt is capable of making her own decisions while here, her daughter will be here daily and be involved   Abuse/Neglect Abuse/Neglect Assessment Can Be Completed: Yes Physical Abuse: Denies Verbal Abuse: Denies Sexual Abuse: Denies Exploitation of patient/patient's resources: Denies Self-Neglect: Denies  Emotional Status Pt's affect, behavior and adjustment status: Pt is motivated to do well but doesn't want to be pushed too hard, she is one that likes to do things on her own time. She is sedentary and watches a lot of tv each day. She wants to be mod/i wheelchair level when she is discharged from here. Recent Psychosocial Issues: other health issues feels they are managable-dealing with her ankle the past couple of weeks Psychiatric History: History of panic attacks-takes medications for this, but then says: " I can manage it doesn't stop me."  Feel pt would be good to be seen by neuro-psych while here, for support and coping with the loss of her leg Substance Abuse History: No issues  Patient / Family Perceptions, Expectations & Goals Pt/Family understanding of illness & functional limitations: Pt is able to explain her ankle fracture and now her amputation, she wonder sif it was really necessary due to the length of time it took for the surgeon to do surgery. She plans to follow up later with what she will do. She did talk with the MD and feels her pain is being managed. 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, friend, church member, retiree, Social research officer, government Anticipated changes in roles/activities/participation:  resume Pt/family expectations/goals: Pt states: " I want to do as much as I can for myself before going home, my daughter can help, but I want to do it on my own."  Daughter states: " I hope she does well there, this is all new to [place]."  [employer]: None Premorbid Home Care/DME Agencies: Other (Comment)(rw, 3in1 ) Transportation available at discharge: Daughter pt did not drive prior to admission Resource referrals recommended: Neuropsychology, Support group (specify)  Discharge Planning Living Arrangements: Children Support Systems: Children, Water engineer, Church/faith community Type of Residence: Private residence Insurance Resources: [employer], Multimedia programmer (specify)(tricare) Museum/gallery curator Resources: Fish farm manager, Family Support Financial Screen Referred: No Living Expenses: Education officer, community Management: Patient, Family Does the patient have any problems obtaining your medications?: No Home Management: Daughter pt does some but is limited now and has been since her ankle issues Patient/Family Preliminary Plans: Return home with daughter who is able to assist she is not employed. Both hope she will be mobile with a wheelchair and ambulating some with a rolling walker. Pt is still adjusting to her  new normal and is upset at times regarding her amputation. Sw Barriers to Discharge: Inaccessible home environment Sw Barriers to Discharge Comments: Will need a ramp to get up the stairs and landlord is in another country right now Social Work Anticipated Follow Up Needs: HH/OP, Support Group  Clinical Impression Pleasant and little anxious female who is ready to work but doesn't want to be pushed too hard in therapies. She wants to do well here and her daughter is able to assist her at discharge. Will await therapy goals and work on a safe plan for her. Do feel she would benefit from seeing neuro-psych with her history of panic attacks and for coping over the loss of  her limb. Will make referral.  Elease Hashimoto 03/14/2019, 10:08 AM

## 2019-03-14 NOTE — Progress Notes (Signed)
Inpatient Rehabilitation  Patient information reviewed and entered into eRehab system by Thurley Francesconi M. Tequilla Cousineau, M.A., CCC/SLP, PPS Coordinator.  Information including medical coding, functional ability and quality indicators will be reviewed and updated through discharge.    Per nursing patient was given "Data Collection Information Summary" for Patients in Inpatient Rehabilitation Facilities with attached "Privacy Act Statement-Health Care Records" upon admission.   

## 2019-03-14 NOTE — Progress Notes (Signed)
[facility] PHYSICAL MEDICINE & REHABILITATION PROGRESS NOTE  Subjective/Complaints: Patient seen sitting up in bed this morning.  She states she slept fairly overnight due to pain.  Discussed pain medications with patient today.  She has same questions for nursing, discussed with nursing as well.  She states she is not sure if she will be able to participate in therapies.  She was seen by heart failure, notes reviewed.  ROS: + Stump pain.  Denies CP, shortness of breath, nausea, vomiting, diarrhea.  Objective: Vital Signs: Blood pressure (!) 106/49, pulse 74, temperature 97.8 F (36.6 C), temperature source Oral, resp. rate 16, height 5\' 9"  (1.753 m), weight 92.7 kg, SpO2 100 %. No results found. Recent Labs    03/13/19 1126 03/14/19 0813  WBC 10.6* 12.6*  HGB 7.9* 8.0*  HCT 26.3* 27.3*  PLT 326 336   Recent Labs    03/13/19 1126 03/14/19 0813  NA 133* 134*  K 3.8 3.4*  CL 93* 92*  CO2 30 30  GLUCOSE 130* 129*  BUN 37* 34*  CREATININE 1.71* 1.70*  CALCIUM 9.2 9.1    Physical Exam: BP (!) 106/49 (BP Location: Left Arm)   Pulse 74   Temp 97.8 F (36.6 C) (Oral)   Resp 16   Ht 5\' 9"  (1.753 m)   Wt 92.7 kg   SpO2 100%   BMI 30.18 kg/m  Constitutional: No distress . Vital signs reviewed. HENT: Normocephalic.  Atraumatic. Eyes: EOMI. No discharge. Cardiovascular: RRR. No JVD. Respiratory: CTA Bilaterally. Normal effort. GI: BS +. Non-distended. Musc: No edema or tenderness in extremities. Musculoskeletal: Right stump edema with significant tenderness  Neurological: She is alert.  She follows full commands. Motor: Bilateral upper extremities: 4-/5 proximal distal Left lower extremity: 4/5 proximal distal Right lower extremity: Hip flexion 3/5 (pain inhibition) Sensation diminished to light touch left foot  Skin: Right stump with dressing CD/I  Psychiatric: Her affect is blunt.   Assessment/Plan: 1. Functional deficits secondary to right BKA complicated by  NSTEMI and septic shock which require 3+ hours per day of interdisciplinary therapy in a comprehensive inpatient rehab setting.  Physiatrist is providing close team supervision and 24 hour management of active medical problems listed below.  Physiatrist and rehab team continue to assess barriers to discharge/monitor patient progress toward functional and medical goals  Care Tool:  Bathing              Bathing assist       Upper Body Dressing/Undressing Upper body dressing        Upper body assist      Lower Body Dressing/Undressing Lower body dressing            Lower body assist       Toileting Toileting    Toileting assist Assist for toileting: 2 Helpers     Transfers Chair/bed transfer  Transfers assist  Chair/bed transfer activity did not occur: Safety/medical concerns        Locomotion Ambulation   Ambulation assist              Walk 10 feet activity   Assist           Walk 50 feet activity   Assist           Walk 150 feet activity   Assist           Walk 10 feet on uneven surface  activity   Assist  Wheelchair     Assist               Wheelchair 50 feet with 2 turns activity    Assist            Wheelchair 150 feet activity     Assist            Medical Problem List and Plan: 1.  Decreased functional mobility secondary to right BKA 63/12/6008 complicated by perioperative non-STEMI/septic shock             Begin CIR 2.  Antithrombotics: -DVT/anticoagulation:  Subcutaneous heparin             -antiplatelet therapy:  Aspirin 81 mg daily, Plavix 75 mg daily 3. Pain Management:  Neurontin 600 mg 3 times a day,tramadol 100 mg 3 times a day, oxycodone as needed  Will require significant encouragement and coping mechanisms 4. Mood: Klonopin 0.5 mg twice a day as needed             -antipsychotic agents: see above 5. Neuropsych: This patient is capable of making  decisions on his own behalf. 6. Skin/Wound Care:  Routine skin checks 7. Fluids/Electrolytes/Nutrition: routine and out's  8. Acute blood loss anemia.   Hemoglobin 8.0 on 2/17  Continue to monitor 9. 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 Monitor for any signs of fluid overload. Lanoxin 0.125 mg daily, Aldactone 12.5 mg daily, Demadex 40 mg daily. Patient is off ProAmatine.   Filed Weights   03/13/19 1721  Weight: 92.7 kg   10. Diabetes mellitus with peripheral neuropathy. Hemoglobin A1c 7.8.   Lantus insulin 60 units twice a day, decreased to 30 BID on 3/17  Cont NovoLog 7 units 3 times a day.    Hypoglycemia on 3/17 and, with CBG of 39  Monitor with increased mobility 11. Hyperlipidemia. Lipitor 12.  Supplemental oxygen dependent             Wean as tolerated 13. CAD  S/p NSTEMI  Appreciate HF recs 14. Hyponatremia  Na 134 on 3/17  Cont to monitor 15. Hypokalemia  K+ 3.4 on 3/17  Supplemented x1 day 16. AKI  Cr 1.70 on 3/17  Encourage fluids  Cont to monitor 17. Leukocytosis  WBCs 12.6 on 3/17  On cefepime  Afebrile  Cont to monitor  LOS: 1 days A FACE TO FACE EVALUATION WAS PERFORMED  Victoire Deans Lorie Phenix 03/14/2019, 9:16 AM

## 2019-03-14 NOTE — Evaluation (Signed)
Occupational Therapy Assessment and Plan  Patient Details  Name: [name] MRN: [id number] Date of Birth: [birth year]  OT Diagnosis: acute pain and muscle weakness (generalized) Rehab Potential: Rehab Potential (ACUTE ONLY): Fair ELOS: 14-17 days   Today's Date: 03/14/2019 OT Individual Time: 1035-1130 OT Individual Time Calculation (min): 55 min     Problem List:  Patient Active Problem List   Diagnosis Date Noted  . AKI (acute kidney injury) ([place])   . Diabetic osteomyelitis ([place])   . Hypokalemia   . Diabetic peripheral neuropathy ([place])   . Labile blood glucose   . Hypoglycemia   . Acute on chronic systolic heart failure ([place])   . Right below-knee amputee ([place]) 03/13/2019  . Supplemental oxygen dependent   . Postoperative pain   . Acute blood loss anemia   . Acute on chronic systolic (congestive) heart failure ([place])   . Orthostasis   . Diabetes mellitus type 2 in obese ([place])   . Dyslipidemia   . Non-STEMI (non-ST elevated myocardial infarction) ([place])   . Encounter for central line placement   . Below-knee amputation of right lower extremity ([place]) 03/06/2019  . S/P below knee amputation, right ([place]) 03/06/2019  . Hypoxemia   . Shock circulatory ([place])   . Acute pain   . Acute encephalopathy   . Gastroparesis due to DM ([place]) 04/21/2016  . DM type 2 with diabetic peripheral neuropathy ([place]) 03/12/2016  . 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 Peripheral polyneuropathy 11/14/2014  . Fracture of humerus, proximal, left, closed 02/23/2014  . Proximal humerus fracture 02/23/2014    Past Medical History:  Past Medical History:  Diagnosis Date  . Below-knee amputation of right lower extremity ([place]) 03/06/2019  . 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 CHF (congestive heart failure) ([place])   . Complication of anesthesia   . Coronary artery disease   . Diabetes mellitus without complication ([place])    type 2   . Fracture    right ankle  . Fracture of humerus, proximal,  left, closed 02/23/2014  . Full dentures   . Myocardial infarct ([place])   . Neuropathic pain   . Panic attacks    Hx: of  . PONV (postoperative nausea and vomiting)   . Wears glasses    Past Surgical History:  Past Surgical History:  Procedure Laterality Date  . APPENDECTOMY    . BREAST BIOPSY Left 02/12/15   benign, clip placed  . BREAST SURGERY Right 2012   Right mastectomy with SLN, DCIS, grade 1-2. ER/PR not performed.   Marland Kitchen CARDIAC CATHETERIZATION    . cardiac stents  2007  . CHOLECYSTECTOMY    . COLON SURGERY     Subtotal colectomy for colonic inertia.  . COLONOSCOPY    . DILATION AND CURETTAGE OF UTERUS    . EYE SURGERY    . LEFT HEART CATH AND CORONARY ANGIOGRAPHY Left 01/14/2018   Procedure: LEFT HEART CATH AND CORONARY ANGIOGRAPHY;  Surgeon: Dionisio David, MD;  Location: [facility];  Service: Cardiovascular;  Laterality: Left;  . LEFT HEART CATH AND CORONARY ANGIOGRAPHY N/A 03/10/2019   Procedure: LEFT HEART CATH AND CORONARY ANGIOGRAPHY;  Surgeon: Larey Dresser, MD;  Location: [facility];  Service: Cardiovascular;  Laterality: N/A;  . MASTECTOMY Right 2012   Hx: of right breast, DCIS   . MULTIPLE TOOTH EXTRACTIONS    . ORIF ANKLE FRACTURE Right 03/06/2019   Procedure: RIGHT BELOW KNEE AMPUTATION;  Surgeon:  Marchia Bond, MD;  Location: [facility];  Service: Orthopedics;  Laterality: Right;  . ORIF HUMERUS FRACTURE Left 02/23/2014   Procedure: OPEN REDUCTION INTERNAL FIXATION (ORIF) PROXIMAL HUMERUS FRACTURE;  Surgeon: Johnny Bridge, MD;  Location: [facility];  Service: Orthopedics;  Laterality: Left;  . TUBAL LIGATION      Assessment & Plan Clinical Impression: Patient is a 68 y.o. .right-handed female with history of diabetes mellitus maintained on Lantus insulin 60 units twice a day, diastolic congestive heart failure maintained on Lasix 40 mg daily, CAD with stenting 2008 maintained on Plavix followed by cardiology services Dr. Chancy Milroy, panic attacks. Per chart  review and patient, patient lives with daughter and assistance as needed. One level home with 2 steps to entry. Independent prior to admission. Presented 03/06/2019 after a recent fall 02/15/2019 sustaining a right ankle fracture followed by orthopedic services. She was initially placed in a splint as an outpatient after undergoing reduction in the emergency room and referred to orthopedic services. She had another fall, did not seek medical help. Noted bleeding through the splint after most recent fall and again did not pursue medical attention. Upon evaluation noted extremely foul odor and the tibia was exposed. 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 Mardelle Matte. Postoperative hypotension required intubation maintained on pressors. Findings of elevated troponin with cardiology services follow-up. Echocardiogram with ejection fraction 25% severely reduced systolic function. No intervention required remained on aspirin and Plavix therapy as well as initially intravenous heparin. Patient did undergo gentle diuresis close monitoring of renal function  1.8. Follow critical care medicine for septic shock in the setting of right ankle infection. She was initially maintained on ProAmatine for orthostasis. Acute blood loss anemia 7.8-8.2.Subcutaneous heparin for DVT prophylaxis. Therapy evaluations completed with recommendations of physical medicine rehabilitation consult. Patient was admitted for a comprehensive rehabilitation program.   Patient transferred to CIR on 03/13/2019 .    Patient currently requires total with basic self-care skills secondary to muscle weakness, decreased awareness, decreased problem solving and decreased safety awareness and decreased sitting balance and decreased standing balance.  Prior to hospitalization, patient could complete ADLs with assistance for bathing and dressing, Mod I with toilet transfers/toileting.  Patient will benefit from skilled  intervention to decrease level of assist with basic self-care skills prior to discharge home with care partner.  Anticipate patient will require 24 hour supervision and follow up home health.  OT - End of Session Activity Tolerance: Tolerates 30+ min activity with multiple rests Endurance Deficit: Yes Endurance Deficit Description: required frequent rest breaks OT Assessment Rehab Potential (ACUTE ONLY): Fair OT Barriers to Discharge: Inaccessible home environment OT Patient demonstrates impairments in the following area(s): Balance;Endurance;Motor;Pain;Safety;Skin Integrity OT Basic ADL's Functional Problem(s): Grooming;Bathing;Dressing;Toileting OT Transfers Functional Problem(s): Toilet;Tub/Shower OT Additional Impairment(s): None OT Plan OT Intensity: Minimum of 1-2 x/day, 45 to 90 minutes OT Frequency: 5 out of 7 days OT Duration/Estimated Length of Stay: 14-17 days OT Treatment/Interventions: Balance/vestibular training;Discharge planning;Disease Lawyer;Functional mobility training;Pain management;Patient/family education;Psychosocial support;Self Care/advanced ADL retraining;Skin care/wound managment;Splinting/orthotics;Therapeutic Activities;Therapeutic Exercise;UE/LE Strength taining/ROM;Wheelchair propulsion/positioning OT Basic Self-Care Anticipated Outcome(s): Supervision- Min assist OT Toileting Anticipated Outcome(s): Supervision OT Bathroom Transfers Anticipated Outcome(s): Supervsion OT Recommendation Recommendations for Other Services: Neuropsych consult Patient destination: Home Follow Up Recommendations: Home health OT Equipment Recommended: 3 in 1 bedside comode;Tub/shower bench   Skilled Therapeutic Intervention OT eval completed with discussion of rehab process, OT purpose, POC, ELOS, and goals.  ADL assessment completed with  undressing and completing LB hygiene at bed level.  Pt received upright in w/c reporting  fatigue and requesting to return to bed.  Pt agreeable to engaging in grooming tasks with setup assist.  Pt required frequent cues and encouragement to participate in any functional tasks. Pt continuously requesting to return to bed.  Completed squat pivot transfer to Rt to bed with max assist.  Once supine in bed pt reports need to urinate and refused OOB.  Therapist assisted pt with positioning on bedpan and completed hygiene and clothing management with total assist.  Pt left supine in bed with all needs in reach.  OT Evaluation Precautions/Restrictions  Precautions Precautions: Fall Precaution Comments: watch BP Restrictions Weight Bearing Restrictions: Yes RLE Weight Bearing: Non weight bearing Other Position/Activity Restrictions: Pt R NWB with new BKA General   Vital Signs  Pain Pain Assessment Pain Scale: 0-10 Pain Score: 6  Pain Type: Surgical pain Pain Location: Leg Pain Orientation: Right Pain Descriptors / Indicators: Aching Pain Onset: On-going Pain Intervention(s): RN made aware;Repositioned;Rest Home Living/Prior Functioning Home Living Family/patient expects to be discharged to:: Private residence Living Arrangements: Children Available Help at Discharge: Available 24 hours/day, Family Type of Home: House Home Access: Stairs to enter Technical brewer of Steps: 2 Entrance Stairs-Rails: None Home Layout: One level Bathroom Shower/Tub: Tub/shower unit, Industrial/product designer: No Additional Comments: working on getting a ramp installed  Lives With: Daughter IADL History Homemaking Responsibilities: Yes Meal Prep Responsibility: Therapist, occupational Responsibility: Primary Cleaning Responsibility: Primary Bill Paying/Finance Responsibility: Primary Shopping Responsibility: Primary Homemaking Comments: prior to hurting leg in Feb 7408 Current License: No Prior Function Level of Independence: Independent with gait,  Independent with transfers, Needs assistance with ADLs  Able to Take Stairs?: Yes Driving: No Comments: does not drive, daughter would assist pt with bathing at sink Vision Baseline Vision/History: Retinopathy Patient Visual Report: No change from baseline Vision Assessment?: No apparent visual deficits Additional Comments: poor vision; unable to read unless large print Cognition Overall Cognitive Status: Within Functional Limits for tasks assessed Arousal/Alertness: Awake/alert Orientation Level: Person;Place;Situation Person: Oriented Place: Oriented Situation: Oriented Year: 2020 Month: March Day of Week: Correct Memory: Appears intact Immediate Memory Recall: Sock;Blue;Bed Memory Recall: Sock;Blue;Bed Memory Recall Sock: Without Cue Memory Recall Blue: Without Cue Memory Recall Bed: With Cue Attention: Selective Selective Attention: Appears intact Awareness: Impaired Problem Solving: Impaired Behaviors: Poor frustration tolerance Safety/Judgment: Impaired Sensation Sensation Light Touch: Impaired Detail Light Touch Impaired Details: Impaired RLE Proprioception: Appears Intact Coordination Gross Motor Movements are Fluid and Coordinated: Yes Fine Motor Movements are Fluid and Coordinated: Yes Motor  Motor Motor - Skilled Clinical Observations: generalized weakness Mobility  Bed Mobility Bed Mobility: Rolling Left;Rolling Right;Supine to Sit Rolling Right: Minimal Assistance - Patient > 75% Rolling Left: Minimal Assistance - Patient > 75% Supine to Sit: Maximal Assistance - Patient - Patient 25-49% Transfers Sit to Stand: Maximal Assistance - Patient 25-49%  Trunk/Postural Assessment  Cervical Assessment Cervical Assessment: Within Functional Limits Thoracic Assessment Thoracic Assessment: Within Functional Limits Lumbar Assessment Lumbar Assessment: (posterior pelvic tilt) Postural Control Postural Control: Deficits on evaluation Righting Reactions:  delayed  Balance Dynamic Sitting Balance Sitting balance - Comments: supervision sitting EOB Static Standing Balance Static Standing - Comment/# of Minutes: min A with RW Extremity/Trunk Assessment RUE Assessment RUE Assessment: Within Functional Limits LUE Assessment LUE Assessment: Within Functional Limits     Refer to Care Plan for Long Term Goals  Recommendations for other services: Neuropsych   Discharge  Criteria: Patient will be discharged from OT if patient refuses treatment 3 consecutive times without medical reason, if treatment goals not met, if there is a change in medical status, if patient makes no progress towards goals or if patient is discharged from hospital.  The above assessment, treatment plan, treatment alternatives and goals were discussed and mutually agreed upon: by patient  Simonne Come 03/14/2019, 1:52 PM

## 2019-03-14 NOTE — Progress Notes (Signed)
Occupational Therapy Session Note  Patient Details  Name: [name] MRN: [id number] Date of Birth: [birth year]  Today's Date: 03/14/2019 OT Individual Time: 9179-1505 OT Individual Time Calculation (min): 70 min    Short Term Goals: Week 1:  OT Short Term Goal 1 (Week 1): Pt will complete LB dressing max assist at sit > stand level OT Short Term Goal 2 (Week 1): Pt will complete bathing with mod assist at sit > stand level OT Short Term Goal 3 (Week 1): Pt will complete toilet transfer mod assist  OT Short Term Goal 4 (Week 1): Pt will complete sit > stand with mod assist to decrease burden of care with LB dressing  Skilled Therapeutic Interventions/Progress Updates:  Treatment session with focus on education regarding DME and residual limb care and desensitization.  Pt received upright in bed watching tv.  Pt resistant to therapy session as she was watching her "favorite soap opera".  Re-educated on purpose of therapy sessions and required 3 hours of therapy/day.  Provided pt with drop arm BSC and discussed plan to begin using BSC instead of bedpan to increase OOB tolerance and further practice with transfers.  Pt resistant to plan but ultimately agreeable as pt reports intention to use toilet at home. Discussed pt notebook resources and referenced "First Step" publication regarding therapy process, limb healing, and pain management.  Provided pt with additional handouts regarding limb wrapping, donning shrinker, and desensitization strategies.  Engaged in prolonged discussion regarding limb inspection and pain management.  Pt will benefit from extensive education regarding limb inspection and management of wrapping and limb care with her daughter due to her visual impairments.  Pt left supine in bed with all needs in reach.  Therapy Documentation Precautions:  Precautions Precautions: Fall Precaution Comments: watch BP Restrictions Weight Bearing Restrictions: Yes RLE Weight  Bearing: Non weight bearing Other Position/Activity Restrictions: Pt R NWB with new BKA General:   Vital Signs: Therapy Vitals Temp: 98.7 F (37.1 C) Pulse Rate: 73 Resp: 19 BP: (!) 100/52 Patient Position (if appropriate): Sitting Oxygen Therapy SpO2: 99 % O2 Device: Room Air Pain:  Pt with c/o pain in residual limb.  RN Provided meds at beginning of session and repositioned RLE for comfort.   Therapy/Group: Individual Therapy  Simonne Come 03/14/2019, 3:56 PM

## 2019-03-14 NOTE — Progress Notes (Signed)
Hypoglycemic Event  CBG: 39  Treatment: 8 oz juice/soda  Symptoms: None  Follow-up CBG: Time:0710 CBG Result:80  Possible Reasons for Event: Unknown  Comments/MD notified:yes    [name]  [name]

## 2019-03-14 NOTE — Plan of Care (Signed)
  Problem: Consults Goal: RH LIMB LOSS PATIENT EDUCATION Description Description: See Patient Education module for eduction specifics. Outcome: Progressing   Problem: RH BOWEL ELIMINATION Goal: RH STG MANAGE BOWEL WITH ASSISTANCE Description STG Manage Bowel with min Assistance.  Outcome: Progressing Flowsheets (Taken 03/14/2019 1321) STG: Pt will manage bowels with assistance: 2-Maximum assistance   Problem: RH BLADDER ELIMINATION Goal: RH STG MANAGE BLADDER WITH ASSISTANCE Description STG Manage Bladder With  Min Assistance  Outcome: Progressing Flowsheets (Taken 03/14/2019 1321) STG: Pt will manage bladder with assistance: 2-Maximum assistance   Problem: RH SKIN INTEGRITY Goal: RH STG SKIN FREE OF INFECTION/BREAKDOWN Description Min assist  Outcome: Progressing Goal: RH STG MAINTAIN SKIN INTEGRITY WITH ASSISTANCE Description STG Maintain Skin Integrity With min Assistance.  Outcome: Progressing Flowsheets (Taken 03/14/2019 1321) STG: Maintain skin integrity with assistance: 2-Maximum assistance Goal: RH STG ABLE TO PERFORM INCISION/WOUND CARE W/ASSISTANCE Description STG Able To Perform Incision/Wound Care With  [employer].  Outcome: Progressing Flowsheets (Taken 03/14/2019 1321) STG: Pt will be able to perform incision/wound care with assistance: 2-Maximum assistance   Problem: RH SAFETY Goal: RH STG ADHERE TO SAFETY PRECAUTIONS W/ASSISTANCE/DEVICE Description STG Adhere to Safety Precautions With  Min Assistance/Device.  Outcome: Progressing Flowsheets (Taken 03/14/2019 1321) STG:Pt will adhere to safety precautions with assistance/device: 2-Maximum assistance   Problem: RH PAIN MANAGEMENT Goal: RH STG PAIN MANAGED AT OR BELOW PT'S PAIN GOAL Description Manage pain at or below level 4  Outcome: Progressing   Problem: RH KNOWLEDGE DEFICIT LIMB LOSS Goal: RH STG INCREASE KNOWLEDGE OF SELF CARE AFTER LIMB LOSS Description  dtr to Manage care at discharge  using handouts/resource notebook independently  Outcome: Progressing

## 2019-03-15 ENCOUNTER — Inpatient Hospital Stay (HOSPITAL_COMMUNITY): Payer: Medicare Other | Admitting: Occupational Therapy

## 2019-03-15 ENCOUNTER — Inpatient Hospital Stay (HOSPITAL_COMMUNITY): Payer: Medicare Other | Admitting: Physical Therapy

## 2019-03-15 ENCOUNTER — Encounter (HOSPITAL_COMMUNITY): Payer: Medicare Other | Admitting: Psychology

## 2019-03-15 LAB — GLUCOSE, CAPILLARY
GLUCOSE-CAPILLARY: 214 mg/dL — AB (ref 70–99)
GLUCOSE-CAPILLARY: 310 mg/dL — AB (ref 70–99)
Glucose-Capillary: 192 mg/dL — ABNORMAL HIGH (ref 70–99)
Glucose-Capillary: 220 mg/dL — ABNORMAL HIGH (ref 70–99)

## 2019-03-15 LAB — BASIC METABOLIC PANEL
ANION GAP: 8 (ref 5–15)
BUN: 32 mg/dL — ABNORMAL HIGH (ref 8–23)
CALCIUM: 8.6 mg/dL — AB (ref 8.9–10.3)
CO2: 30 mmol/L (ref 22–32)
Chloride: 94 mmol/L — ABNORMAL LOW (ref 98–111)
Creatinine, Ser: 1.62 mg/dL — ABNORMAL HIGH (ref 0.44–1.00)
GFR calc Af Amer: 37 mL/min — ABNORMAL LOW (ref >60)
GFR calc non Af Amer: 32 mL/min — ABNORMAL LOW (ref >60)
Glucose, Bld: 182 mg/dL — ABNORMAL HIGH (ref 70–99)
Potassium: 4.1 mmol/L (ref 3.5–5.1)
Sodium: 132 mmol/L — ABNORMAL LOW (ref 135–145)

## 2019-03-15 MED ORDER — SPIRONOLACTONE 25 MG PO TABS
25.0000 mg | ORAL_TABLET | Freq: Every day | ORAL | Status: DC
  Administered 2019-03-15 – 2019-03-30 (×16): 25 mg via ORAL
  Filled 2019-03-15 (×16): qty 1

## 2019-03-15 MED ORDER — INSULIN GLARGINE 100 UNIT/ML ~~LOC~~ SOLN
45.0000 [IU] | Freq: Two times a day (BID) | SUBCUTANEOUS | Status: DC
  Administered 2019-03-15 – 2019-03-17 (×4): 45 [IU] via SUBCUTANEOUS
  Filled 2019-03-15 (×5): qty 0.45

## 2019-03-15 NOTE — Progress Notes (Signed)
Occupational Therapy Session Note  Patient Details  Name: [name] MRN: [id number] Date of Birth: [birth year]  Today's Date: 03/15/2019 OT Individual Time: 1405-1505 OT Individual Time Calculation (min): 60 min    Short Term Goals: Week 1:  OT Short Term Goal 1 (Week 1): Pt will complete LB dressing max assist at sit > stand level OT Short Term Goal 2 (Week 1): Pt will complete bathing with mod assist at sit > stand level OT Short Term Goal 3 (Week 1): Pt will complete toilet transfer mod assist  OT Short Term Goal 4 (Week 1): Pt will complete sit > stand with mod assist to decrease burden of care with LB dressing  Skilled Therapeutic Interventions/Progress Updates:    Treatment session with focus on functional transfers and sit <> stand.  Pt received supine in bed agreeable to therapy session.  Pt reports need to toilet and requested bedpan.  Discussed OT goals and pt agreeable to transfer to drop arm BSC.  Pt completed bed mobility to sit to EOB with CGA.  Therapist placed slide board while providing education on proper placement.  Completed slide board transfer with mod assist to drop arm BSC, pt would benefit from wide drop arm BSC to allow for increased surface area for slide board.  Completed sit > partial stand with pt pushing up from arm rests to allow therapist to adjust clothing.  Pt continent of bowel and bladder.  Required +2 for hygiene due to fatigue from transfer and having BM with therapist providing max assist for sit > stand while 2nd person completed hygiene.  Pt unable to stand 2nd time, therefore utilized [place] for sit > stand to pull pants over hips and return to bed.  Pt reports worn out.  RN present to provide scheduled meds and pain meds.  Completed grooming tasks from bed level with setup for items.  Pt remained in bed with all needs in reach.  Therapy Documentation Precautions:  Precautions Precautions: Fall Precaution Comments: watch  BP Restrictions Weight Bearing Restrictions: Yes RLE Weight Bearing: Non weight bearing Other Position/Activity Restrictions: Pt R NWB with new BKA General:   Vital Signs: Therapy Vitals Temp: 98.4 F (36.9 C) Temp Source: Oral Pulse Rate: 73 Resp: 17 BP: (!) 109/48 Patient Position (if appropriate): Lying Oxygen Therapy SpO2: 100 % O2 Device: Room Air Pain: Pain Assessment Pain Scale: 0-10 Pain Score: 2    Therapy/Group: Individual Therapy  Simonne Come 03/15/2019, 3:49 PM

## 2019-03-15 NOTE — Progress Notes (Signed)
Physical Therapy Session Note  Patient Details  Name: [name] MRN: [id number] Date of Birth: 10/30/1951  Today's Date: 03/15/2019 PT Individual Time: 0805-0915 PT Individual Time Calculation (min): 70 min   Short Term Goals: Week 1:  PT Short Term Goal 1 (Week 1): pt will consistently perform transfers with min A PT Short Term Goal 2 (Week 1): pt will propel w/c with supervision x 50' in controlled environment  Skilled Therapeutic Interventions/Progress Updates: Pt presented in bed agreeable to therapy. Pt stating pain 9/10 however received pain meds just prior to therapy. Pt indicating increased pressure and feeling SOB when HOB elevated in bed, SpO2 100%. Pt performed supine to sit with minA and increased time with cues for sequencing. PTA threaded pants total A for time management, performed STS from slightly elevated bed with modA and use of RW. Pt then performed squat pivot to bed with modA. Pt noted to have increased anxiety with squat pivot transfer with pt frequently attempting to place arms around PTA. Pt instructed in use of BUE and sequencing for frequency. Pt propelled w/c supervision level with verbal cues for increased shoulder extension. Performed squat pivot transfer to R modA with pt reaching for PTA vs keeping hands on w/c and mat. PTA provided edu on how reaching for PTA may be an increased safety risk with pt seeming not receptive. Cardiac PA and Dr Patel assessed pt while pt at mat and discussed increased use of IS. PTA found pt's IS (in personal belongings bag) and had pt perform x10 with avg 1000ml (x3 1250ml). Educated pt to continue using IS throughout day at least every other waking hour. Pt also provided with amputee exercise sheet and initiated as noted below. Pt also instructed in reinforcement of knee extension to prevent contracture. Pt was able to scoot into long sit and then required modA sit to supine with pillows and wedge placed. After completion of  exercises pt returned to long sit with modA and pt able to rotate to EOM. Pt performed squat pivot to w/c modA and pt transported back to room for time management. Pt remained in w/c at end of session and left with call bell within reach and needs met.   Therex: Seated LAQ x 10 Supine: Glute set x 10 Hip abd/add x 10 SLR x10 Modified hip extension over bolster x 10     Therapy Documentation Precautions:  Precautions Precautions: Fall Precaution Comments: watch BP Restrictions Weight Bearing Restrictions: Yes RLE Weight Bearing: Non weight bearing Other Position/Activity Restrictions: Pt R NWB with new BKA General:   Vital Signs: Therapy Vitals Temp: 98.4 F (36.9 C) Temp Source: Oral Pulse Rate: 73 Resp: 17 BP: (!) 109/48 Patient Position (if appropriate): Lying Oxygen Therapy SpO2: 100 % O2 Device: Room Air Pain: Pain Assessment Pain Scale: 0-10 Pain Score: 2     Therapy/Group: Individual Therapy  Rosita DeChalus  Rosita DeChalus, PTA  03/15/2019, 4:16 PM  

## 2019-03-15 NOTE — Progress Notes (Signed)
Patient slept well and no complaints of shortness of breath. Oxygen saturation 96% on room air.

## 2019-03-15 NOTE — Progress Notes (Addendum)
[place] PHYSICAL MEDICINE & REHABILITATION PROGRESS NOTE  Subjective/Complaints: Patient seen working with therapy this morning.  She states she slept well overnight.  She states she had a good first chemotherapy yesterday.  She notes shortness of breath when leaning forward.  She states she was told not to use incentive spirometer.  She was seen by heart failure team, notes reviewed.  ROS: + Stump pain.  Denies CP, shortness of breath, nausea, vomiting, diarrhea.  Objective: Vital Signs: Blood pressure (!) 116/53, pulse 88, temperature 98.4 F (36.9 C), resp. rate 19, height 5\' 9"  (1.753 m), weight 91.2 kg, SpO2 96 %. No results found. Recent Labs    03/13/19 1126 03/14/19 0813  WBC 10.6* 12.6*  HGB 7.9* 8.0*  HCT 26.3* 27.3*  PLT 326 336   Recent Labs    03/14/19 0813 03/15/19 0503  NA 134* 132*  K 3.4* 4.1  CL 92* 94*  CO2 30 30  GLUCOSE 129* 182*  BUN 34* 32*  CREATININE 1.70* 1.62*  CALCIUM 9.1 8.6*    Physical Exam: BP (!) 116/53 (BP Location: Left Arm)   Pulse 88   Temp 98.4 F (36.9 C)   Resp 19   Ht 5\' 9"  (1.753 m)   Wt 91.2 kg   SpO2 96%   BMI 29.69 kg/m  Constitutional: No distress . Vital signs reviewed. HENT: Normocephalic.  Atraumatic. Eyes: EOMI. No discharge. Cardiovascular: RRR.  No JVD. Respiratory: CTA bilaterally.  Normal effort. GI: BS +. Non-distended. Musc: No edema or tenderness in extremities. Musculoskeletal: Right stump edema with tenderness, improving  Neurological: She is alert.  She follows full commands. Motor: Bilateral upper extremities: 4/5 proximal distal Left lower extremity: 4/5 proximal distal, stable Right lower extremity: Hip flexion 3+/5 (pain inhibition) Skin: Right stump with dressing CD/I  Psychiatric: Her affect is blunt.   Assessment/Plan: 1. 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  Physiatrist is providing close team supervision and 24 hour management of active medical problems listed below.  Physiatrist and rehab team continue to assess barriers to discharge/monitor patient progress toward functional and medical goals  Care Tool:  Bathing  Bathing activity did not occur: Refused           Bathing assist       Upper Body Dressing/Undressing Upper body dressing   What is the patient wearing?: Hospital gown only    Upper body assist Assist Level: Minimal Assistance - Patient > 75%    Lower Body Dressing/Undressing Lower body dressing      What is the patient wearing?: Pants     Lower body assist Assist for lower body dressing: Total Assistance - Patient < 25%     Toileting Toileting    Toileting assist Assist for toileting: Total Assistance - Patient < 25%     Transfers Chair/bed transfer  Transfers assist  Chair/bed transfer activity did not occur: Safety/medical concerns  Chair/bed transfer assist level: Maximal Assistance - Patient 25 - 49%     Locomotion Ambulation   Ambulation assist   Ambulation activity did not occur: Safety/medical concerns          Walk 10 feet activity   Assist           Walk 50 feet activity   Assist           Walk 150 feet activity   Assist  Walk 10 feet on uneven surface  activity   Assist Walk 10 feet on uneven surfaces activity did not occur: Safety/medical concerns         Wheelchair     Assist Will patient use wheelchair at discharge?: Yes      Wheelchair assist level: Minimal Assistance - Patient > 75% Max wheelchair distance: 25    Wheelchair 50 feet with 2 turns activity    Assist    Wheelchair 50 feet with 2 turns activity did not occur: Safety/medical concerns       Wheelchair 150 feet activity     Assist Wheelchair 150 feet activity did not occur: Safety/medical concerns          Medical  Problem List and Plan: 1.  Decreased functional mobility secondary to right BKA 52/84/1324 complicated by perioperative non-STEMI/septic shock             Continue CIR  Team conference today to discuss current and goals and coordination of care, home and environmental barriers, and discharge planning with nursing, case manager, and therapies.  2.  Antithrombotics: -DVT/anticoagulation:  Subcutaneous heparin             -antiplatelet therapy:  Aspirin 81 mg daily, Plavix 75 mg daily 3. Pain Management:  Neurontin 600 mg 3 times a day,tramadol 100 mg 3 times a day, oxycodone as needed  Will require significant encouragement and coping mechanisms 4. Mood: Klonopin 0.5 mg twice a day as needed             -antipsychotic agents: see above 5. Neuropsych: This patient is capable of making decisions on his own behalf. 6. Skin/Wound Care:  Routine skin checks 7. Fluids/Electrolytes/Nutrition: routine and out's  8. Acute blood loss anemia.   Hemoglobin 8.0 on 2/17  Continue to monitor 9. 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 Monitor for any signs of fluid overload. Lanoxin 0.125 mg daily, Aldactone 12.5 mg daily, Demadex 40 mg daily. Patient is off ProAmatine.   Filed Weights   03/13/19 1721 03/15/19 0500  Weight: 92.7 kg 91.2 kg   10. Diabetes mellitus with peripheral neuropathy. Hemoglobin A1c 7.8.   Lantus insulin 60 units twice a day, decreased to 30 BID on 3/17, increased to 45 twice daily on 3/18  Cont NovoLog 7 units 3 times a day.    Monitor with increased mobility 11. Hyperlipidemia. Lipitor 12.  Supplemental oxygen dependent             Wean as tolerated  Encouraged IS 13. CAD  S/p NSTEMI  Appreciate HF recs 14. Hyponatremia  Na 132 on 3/18  Cont to monitor 15. Hypokalemia: Resolved after supplementation  K+ 4.1 on 3/18 16. AKI  Cr 1.61 2/18  Encourage fluids  Cont to monitor 17. Leukocytosis  WBCs 12.6 on 3/17  Will order labs within the  week  On cefepime  Afebrile  Cont to monitor  LOS: 2 days A FACE TO FACE EVALUATION WAS PERFORMED  Ankit Lorie Phenix 03/15/2019, 10:01 AM

## 2019-03-15 NOTE — Progress Notes (Signed)
Social Work Patient ID: [name], female   DOB: 28-Jun-1951, 68 y.o.   MRN: [id number] Met wit pt to discuss team conference goals supervision-min assist level goals and target discharge date 4/2. She will need a ramp and if not in place will need to ambulance her home. Discussed having daughter come in for education closer to discharge date. She reports having a good day today and hopes this will continue. Her landlord is back but self quarantined.

## 2019-03-15 NOTE — Progress Notes (Signed)
Occupational Therapy Session Note  Patient Details  Name: [name] MRN: [id number] Date of Birth: Dec 19, 1951  Today's Date: 03/15/2019 OT Individual Time: 1030-1130 OT Individual Time Calculation (min): 60 min    Short Term Goals: Week 1:  OT Short Term Goal 1 (Week 1): Pt will complete LB dressing max assist at sit > stand level OT Short Term Goal 2 (Week 1): Pt will complete bathing with mod assist at sit > stand level OT Short Term Goal 3 (Week 1): Pt will complete toilet transfer mod assist  OT Short Term Goal 4 (Week 1): Pt will complete sit > stand with mod assist to decrease burden of care with LB dressing  Skilled Therapeutic Interventions/Progress Updates:    Patient seated in w/c upon arrival.  She notes some pain in residual limb.  She is disoriented and confused about time of day.  She was able to recall events from the day before, recognize staff and what she had ordered for lunch but stated multiple times during the session that she was confused.  SB transfers to/from w/c, bed and mat table with min A and use of elevated surface under left foot.  Toileting with bed pan due to incontinence in brief - max A for hygiene and clothing management.   Completed unsupported sitting activities for 30 minutes with CS with focus on core strength/mobility and posture.  Lateral leans on mat table to simulate clothing mgmt and hygiene from a seated position.  Patient in bed with alarm set and call bell in reach at close of session due to fatigue  Therapy Documentation Precautions:  Precautions Precautions: Fall Precaution Comments: watch BP Restrictions Weight Bearing Restrictions: Yes RLE Weight Bearing: Non weight bearing Other Position/Activity Restrictions: Pt R NWB with new BKA General:   Vital Signs: Therapy Vitals Temp: 98.4 F (36.9 C) Temp Source: Oral Pulse Rate: 73 Resp: 17 BP: (!) 109/48 Patient Position (if appropriate): Lying Oxygen Therapy SpO2: 100  % O2 Device: Room Air Pain: Pain Assessment Pain Scale: 0-10 Pain Score: 2    Other Treatments:     Therapy/Group: Individual Therapy  Carlos Levering 03/15/2019, 3:46 PM

## 2019-03-15 NOTE — Consult Note (Signed)
Neuropsychological Consultation   Patient:   [name]   DOB:   07-Mar-1951  MR Number:  [id number]  Location:  [facility] [address] [facility] [address] [place] [id number] [place] [address] Dept: [facility]: [phone]           Date of Service:   03/15/2019  Start Time:   1 PM End Time:   2 PM  Provider/Observer:  Ilean Skill, Psy.D.       Clinical Neuropsychologist       Billing Code/Service: [phone]  Chief Complaint:    [name] is a 68 year old female with history of diabetes, diastolic congestive heart failure, CAD with stenting, panic attacks.  The patient presented on 02/15/2019 sustaining a right ankle fracture.  Patient had another fall and did not seek medical help.  Noted bleeding through the spling after that fall.  When leg was later evaluated there was noted extremely foul odor and the tibia was exposed.  Limb was not felt to be salvageable Limb was not salvageable and underwent right BKA on 03/06/2019.  Septic shock followed by critical care and acute blood loss anemia.  Once stable patient referred referred for comprehensive inpatient rehabilitation service.    Reason for Service:  The patient was referred for neurological consultation due to coping and adjustment issues.  Below is the HPI for the current admission.  HPI: [name]  Is a 68 year old right-handed female with history of diabetes mellitus maintained on Lantus insulin 60 units twice a day, diastolic congestive heart failure maintained on Lasix 40 mg daily, CAD with stenting 2008 maintained on Plavix followed by cardiology services Dr. Chancy Milroy, panic attacks. Per chart review and patient, patient lives with daughter and assistance as needed. One level home with 2 steps to entry. Independent prior to admission. Presented 03/06/2019 after a recent fall 02/15/2019 sustaining a right ankle fracture followed by orthopedic  services. She was initially placed in a splint as an outpatient after undergoing reduction in the emergency room and referred to orthopedic services. She had another fall, did not seek medical help. Noted bleeding through the splint after most recent fall and again did not pursue medical attention. Upon evaluation noted extremely foul odor and the tibia was exposed. 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 Mardelle Matte. Postoperative hypotension required intubation maintained on pressors. Findings of elevated troponin with cardiology services follow-up. Echocardiogram with ejection fraction 25% severely reduced systolic function. No intervention required remained on aspirin and Plavix therapy as well as initially intravenous heparin. Patient did undergo gentle diuresis close monitoring of renal function  1.8. Follow critical care medicine for septic shock in the setting of right ankle infection. She was initially maintained on ProAmatine for orthostasis. Acute blood loss anemia 7.8-8.2.Subcutaneous heparin for DVT prophylaxis. Therapy evaluations completed with recommendations of physical medicine rehabilitation consult. Patient was admitted for a compress rehabilitation program.  Current Status:  The patient was oriented today and was able to discuss some of the coping issues she is having post BKA.  The patient reports that she did not initially realize how bad the injury was to her leg but also admits that she did not pay attention to it overly closely.  The patient denies that depression or other issues played a big role in her lack of seeking medical care.  The patient does acknowledge that she is had some coping issues due to the  not too distant death of her husband.  The patient does report that she is motivated to continue with the therapies and she acknowledges are has as had experience with how much her husband improved when he had his leg amputated due to  diabetes.  Behavioral Observation: [name]  presents as a 68 y.o.-year-old Right Caucasian Female who appeared her stated age. her dress was Appropriate and she was Well Groomed and her manners were Appropriate to the situation.  her participation was indicative of Appropriate and Redirectable behaviors.  There were any physical disabilities noted.  she displayed an appropriate level of cooperation and motivation.     Interactions:    Active Appropriate and Redirectable  Attention:   abnormal and attention span appeared shorter than expected for age  Memory:   within normal limits; recent and remote memory intact  Visuo-spatial:  not examined  Speech (Volume):  normal  Speech:   normal; normal  Thought Process:  Coherent and Relevant  Though Content:  WNL; not suicidal and not homicidal  Orientation:   person, place, time/date and situation  Judgment:   Poor  Planning:   Fair  Affect:    Appropriate  Mood:    Euthymic  Insight:   Fair  Medical History:   Past Medical History:  Diagnosis Date  . Below-knee amputation of right lower extremity ([facility]) 03/06/2019  . 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 CHF (congestive heart failure) ([facility])   . Complication of anesthesia   . Coronary artery disease   . Diabetes mellitus without complication ([facility])    type 2   . Fracture    right ankle  . Fracture of humerus, proximal, left, closed 02/23/2014  . Full dentures   . Myocardial infarct ([facility])   . Neuropathic pain   . Panic attacks    Hx: of  . PONV (postoperative nausea and vomiting)   . Wears glasses      Psychiatric History:  Patient denies any prior history of psychiatric issues and denies any significant anxiety or depressive symptoms at this time.  However, she does have a significant history of panic attacks.  The patient denies any reoccurrence are reemergence of panic attacks recently.  Family Med/Psych History:  Family History  Problem Relation Age of Onset   . Diabetes Mother   . Hypertension Mother   . Stroke Mother   . Diabetes Brother   . Breast cancer Neg Hx    Impression/DX:  [name] is a 68 year old female with history of diabetes, diastolic congestive heart failure, CAD with stenting, panic attacks.  The patient presented on 02/15/2019 sustaining a right ankle fracture.  Patient had another fall and did not seek medical help.  Noted bleeding through the spling after that fall.  When leg was later evaluated there was noted extremely foul odor and the tibia was exposed.  Limb was not felt to be salvageable Limb was not salvageable and underwent right BKA on 03/06/2019.  Septic shock followed by critical care and acute blood loss anemia.  Once stable patient referred referred for comprehensive inpatient rehabilitation service.   The patient was oriented today and was able to discuss some of the coping issues she is having post BKA.  The patient reports that she did not initially realize how bad the injury was to her leg but also admits that she did not pay attention to it overly closely.  The patient denies that depression or other issues  played a big role in her lack of seeking medical care.  The patient does acknowledge that she is had some coping issues due to the not too distant death of her husband.  The patient does report that she is motivated to continue with the therapies and she acknowledges are has as had experience with how much her husband improved when he had his leg amputated due to diabetes.   Diagnosis:    BKA, Heart Disease/diabetes         Electronically Signed   _______________________ Ilean Skill, Psy.D.

## 2019-03-15 NOTE — Progress Notes (Addendum)
Advanced Heart Failure Rounding Note  PCP-Cardiologist: No primary care provider on file.   Subjective:    Moved to CIR 03/13/19  Labs 1.62 this am. K 4.1. SBPs 110-120s this am, but 90s overnight.   Feeling OK this am. Leg pain remains her primary complaint. She is complaining of SOB when sitting straight up on PT table. She denies SOB in WC or lying down. Using incentive spirometer.   LHC/RHC (3/14): Hemodynamics:  LV 101/39 Ao 92/50 RA 14 CO (Fick) 6.28 CI (Fick) 2.92 Diagnostic  Dominance: Right  Left Main  Minimal disease.  Left Anterior Descending  Large LAD wrapping around apex. There is a patent proximal LAD stent. Diffuse luminal irregularities. There was probably a diagonal originating from the mid LAD that was occluded [place].  Ramus Intermedius  Small to moderate ramus with 95% ostial/proximal stenosis.  Left Circumflex  50% stenosis mid LCx and 50% stenosis distal LCx. 50% stenosis in proximal small-moderate OM1.  Right Coronary Artery  50% stenosis distal RCA/ostial PLV.    Objective:   Weight Range: 91.2 kg Body mass index is 29.69 kg/m.   Vital Signs:   Temp:  [98 F (36.7 C)-98.7 F (37.1 C)] 98.4 F (36.9 C) (03/18 0547) Pulse Rate:  [73-88] 88 (03/18 0547) Resp:  [18-19] 19 (03/18 0300) BP: (97-120)/(49-75) 116/53 (03/18 0547) SpO2:  [96 %-99 %] 96 % (03/18 0547) Weight:  [91.2 kg] 91.2 kg (03/18 0500) Last BM Date: 03/07/19  Weight change: Filed Weights   03/13/19 1721 03/15/19 0500  Weight: 92.7 kg 91.2 kg   Intake/Output:   Intake/Output Summary (Last 24 hours) at 03/15/2019 0910 Last data filed at 03/15/2019 0800 Gross per 24 hour  Intake 720 ml  Output -  Net 720 ml     Physical Exam    General: NAD  HEENT: Normal Neck: Supple. JVP 5-6. Carotids 2+ bilat; no bruits. No thyromegaly or nodule noted. Cor: PMI nondisplaced. RRR, No M/G/R noted Lungs: CTAB, normal effort. Abdomen: Soft, non-tender, non-distended, no  HSM. No bruits or masses. +BS  Extremities: No cyanosis, clubbing, or rash. S/p R BKA. Neuro: Alert & orientedx3, cranial nerves grossly intact. moves all 4 extremities w/o difficulty. Affect pleasant   Telemetry   Not connected; CIR  EKG    No new tracings.    Labs    CBC Recent Labs    03/13/19 1126 03/14/19 0813  WBC 10.6* 12.6*  NEUTROABS  --  9.7*  HGB 7.9* 8.0*  HCT 26.3* 27.3*  MCV 87.1 87.5  PLT 326 628   Basic Metabolic Panel Recent Labs    03/14/19 0813 03/15/19 0503  NA 134* 132*  K 3.4* 4.1  CL 92* 94*  CO2 30 30  GLUCOSE 129* 182*  BUN 34* 32*  CREATININE 1.70* 1.62*  CALCIUM 9.1 8.6*   Liver Function Tests Recent Labs    03/13/19 1126 03/14/19 0813  AST 31 32  ALT 18 19  ALKPHOS 90 97  BILITOT 0.9 1.0  PROT 6.6 6.8  ALBUMIN 2.4* 2.5*   No results for input(s): LIPASE, AMYLASE in the last 72 hours. Cardiac Enzymes No results for input(s): CKTOTAL, CKMB, CKMBINDEX, TROPONINI in the last 72 hours.  BNP: BNP (last 3 results) Recent Labs    02/09/19 1322  BNP 706.0*    ProBNP (last 3 results) No results for input(s): PROBNP in the last 8760 hours.   D-Dimer No results for input(s): DDIMER in the last 72 hours. Hemoglobin A1C  No results for input(s): HGBA1C in the last 72 hours. Fasting Lipid Panel No results for input(s): CHOL, HDL, LDLCALC, TRIG, CHOLHDL, LDLDIRECT in the last 72 hours. Thyroid Function Tests No results for input(s): TSH, T4TOTAL, T3FREE, THYROIDAB in the last 72 hours.  Invalid input(s): FREET3  Other results:   Imaging    No results found.   Medications:     Scheduled Medications: . aspirin EC  81 mg Oral Daily  . atorvastatin  40 mg Oral QHS  . cholecalciferol  2,000 Units Oral Daily  . clopidogrel  75 mg Oral Daily  . digoxin  0.125 mg Oral Daily  . gabapentin  600 mg Oral TID  . heparin  5,000 Units Subcutaneous Q8H  . insulin aspart  0-15 Units Subcutaneous TID WC  . insulin aspart  7  Units Subcutaneous TID WC  . insulin glargine  30 Units Subcutaneous BID  . metoCLOPramide  10 mg Oral QHS  . pantoprazole  40 mg Oral Daily  . polyethylene glycol  17 g Oral BID  . sodium chloride flush  10-40 mL Intracatheter Q12H  . spironolactone  25 mg Oral Daily  . torsemide  40 mg Oral Daily  . traMADol  100 mg Oral TID    Infusions:   PRN Medications: acetaminophen, bisacodyl, clonazepam, clonazePAM, ondansetron **OR** ondansetron (ZOFRAN) IV, oxyCODONE, sodium chloride flush, sorbitol    Patient [place] a 68 y.o.femalewith h/o CAD s/p LAD stent, chronic systolic CHF, DM2, neuropathy, gastroparesis, anxiety, panic attacks, and breast cancer s/p right mastectomy.   Suffered an NSTEMI in setting of R BKA. Troponin peaked at 23. Dr. Gwenlyn Found assessed and recommended no intervention at this time with relative lack of symptoms. CHF team consulted for med titration.  Transferred to CIR 03/13/19  Assessment/Plan   1. CAD: Patient has history of prior anterior MI with LAD stent. She developed hypotension but no significant ECG changes or chest pain post-op 3/9. Troponin up to 29. Suspect peri-op NSTEMI. LHC on 3/13 showed 95% stenosis ostial/proximal small-moderate ramus and suspected ostial diagonal occlusion.  One of these vessels may have been culprit lesion, no obstructive disease in the major coronaries, no interventional target (ramus too small for stent placement).  - No s/s of ischemia.    - Continue ASA and statin.  -Continue Plavix 75 mg daily. 2. Shock: Suspect mixed septic/cardiogenic. Septic shock with osteomyelitis, WBCstrending down and afebrile, cultures negative.  - Resolved. Now in CIR. Off midodrine.  - She has completed cefepime course.   3. Acute on chronic systolic CHF: Ischemic cardiomyopathy. Apparently, baseline EF is in the 25-30% range. Echo this admission with EF 20-25%. Volume status stable. No CVP. No coox drawn.   - Off midodrine  - Continue digoxin 0.125 daily. -Continue torsemide 40 mg daily pending labwork.  - Increase spironolactone to 25 mg daily.  4. AKI: Cr 1.71 03/13/19.  - Continue torsemide 40 mg daily.  5. Anemia:  - FOBT+.  - Fe deficient. Given feraheme.  Further per primary.  - Hgb 7.9 03/13/19. No overt bleeding.  6. Osteomyelitis:  - s/p right BKA. Ortho following. Now in CIR.  Medication concerns reviewed with patient and pharmacy team. Barriers identified: Not at this time.   Length of Stay: 2  Annamaria Helling  03/15/2019, 9:10 AM  Advanced Heart Failure Team Pager [phone] (M-F; 7a - 4p)  Please contact [facility] for night-coverage after hours (4p -7a ) and weekends on [email or web address]  Patient seen with PA, agree with the above note.   She is stable today, ongoing rehab.   Creatinine down to 1.6, BP stable.  Increase spironolactone to 25 mg daily today.  Will add losartan tomorrow at low dose if creatinine stable to decreased.   Loralie Champagne 03/15/2019

## 2019-03-16 ENCOUNTER — Inpatient Hospital Stay (HOSPITAL_COMMUNITY): Payer: Medicare Other | Admitting: Physical Therapy

## 2019-03-16 ENCOUNTER — Inpatient Hospital Stay (HOSPITAL_COMMUNITY): Payer: Medicare Other

## 2019-03-16 ENCOUNTER — Inpatient Hospital Stay (HOSPITAL_COMMUNITY): Payer: Medicare Other | Admitting: Occupational Therapy

## 2019-03-16 LAB — BASIC METABOLIC PANEL
Anion gap: 14 (ref 5–15)
BUN: 31 mg/dL — ABNORMAL HIGH (ref 8–23)
CO2: 26 mmol/L (ref 22–32)
Calcium: 9.1 mg/dL (ref 8.9–10.3)
Chloride: 89 mmol/L — ABNORMAL LOW (ref 98–111)
Creatinine, Ser: 1.52 mg/dL — ABNORMAL HIGH (ref 0.44–1.00)
GFR calc Af Amer: 40 mL/min — ABNORMAL LOW (ref >60)
GFR calc non Af Amer: 35 mL/min — ABNORMAL LOW (ref >60)
Glucose, Bld: 224 mg/dL — ABNORMAL HIGH (ref 70–99)
Potassium: 4 mmol/L (ref 3.5–5.1)
Sodium: 129 mmol/L — ABNORMAL LOW (ref 135–145)

## 2019-03-16 LAB — GLUCOSE, CAPILLARY
Glucose-Capillary: 211 mg/dL — ABNORMAL HIGH (ref 70–99)
Glucose-Capillary: 231 mg/dL — ABNORMAL HIGH (ref 70–99)
Glucose-Capillary: 211 mg/dL — ABNORMAL HIGH (ref 70–99)
Glucose-Capillary: 229 mg/dL — ABNORMAL HIGH (ref 70–99)

## 2019-03-16 MED ORDER — POTASSIUM CHLORIDE CRYS ER 20 MEQ PO TBCR
40.0000 meq | EXTENDED_RELEASE_TABLET | Freq: Once | ORAL | Status: AC
  Administered 2019-03-16: 40 meq via ORAL
  Filled 2019-03-16: qty 2

## 2019-03-16 MED ORDER — LOSARTAN POTASSIUM 25 MG PO TABS
12.5000 mg | ORAL_TABLET | Freq: Every day | ORAL | Status: DC
  Administered 2019-03-16 – 2019-03-29 (×14): 12.5 mg via ORAL
  Filled 2019-03-16 (×15): qty 1

## 2019-03-16 NOTE — Patient Care Conference (Signed)
Inpatient RehabilitationTeam Conference and Plan of Care Update Date: 03/15/2019   Time: 2:20 PM    Patient Name: [name]      Medical Record Number: [id number]  Date of Birth: [birth year] Sex: Female         Room/Bed: 4M13C/4M13C-01 Payor Info: Payor: MEDICARE / Plan: MEDICARE PART A AND B / Product Type: *No Product type* /    Admitting Diagnosis: r bka  Admit Date/Time:  03/13/2019  5:11 PM Admission Comments: No comment available   Primary Diagnosis:  <principal problem not specified> Principal Problem: <principal problem not specified>  Patient Active Problem List   Diagnosis Date Noted  . AKI (acute kidney injury) ([place])   . Diabetic osteomyelitis ([place])   . Hypokalemia   . Diabetic peripheral neuropathy ([place])   . Labile blood glucose   . Hypoglycemia   . Acute on chronic systolic heart failure ([place])   . Right below-knee amputee ([place]) 03/13/2019  . Supplemental oxygen dependent   . Postoperative pain   . Acute blood loss anemia   . Acute on chronic systolic (congestive) heart failure ([place])   . Orthostasis   . Diabetes mellitus type 2 in obese ([place])   . Dyslipidemia   . Non-STEMI (non-ST elevated myocardial infarction) ([place])   . Encounter for central line placement   . Below-knee amputation of right lower extremity ([place]) 03/06/2019  . S/P below knee amputation, right ([place]) 03/06/2019  . Hypoxemia   . Shock circulatory ([place])   . Acute pain   . Acute encephalopathy   . Gastroparesis due to DM ([place]) 04/21/2016  . DM type 2 with diabetic peripheral neuropathy ([place]) 03/12/2016  . 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 Peripheral polyneuropathy 11/14/2014  . Fracture of humerus, proximal, left, closed 02/23/2014  . Proximal humerus fracture 02/23/2014    Expected Discharge Date: Expected Discharge Date: 03/30/19  Team Members Present: Physician leading conference: Dr. Delice Lesch Social Worker Present: Ovidio Kin, LCSW Nurse Present: Leonette Nutting,  RN PT Present: Georjean Mode, PT;Other (comment)(Cherie Grounenberg-PT) OT Present: Roanna Epley, COTA;Amy Rounds, OT SLP Present: Windell Moulding, SLP PPS Coordinator present : Gunnar Fusi     Current Status/Progress Goal Weekly Team Focus  Medical   Decreased functional mobility secondary to right BKA 24/23/5361 complicated by perioperative non-STEMI/septic shock  Improve mobility, transfers, endurance, DM, AKI, WBCs  See above   Bowel/Bladder   Patient is both continent and incontinent of bowel and bladder. Last BM 3/17.  Encourage timed toileting.  Assess toileting needs prn.   Swallow/Nutrition/ Hydration             ADL's   max assist transfers, total assist LB bathing/dressing, requires max encouragement to participate  Supervision  ADL retraining, activity tolerance, participation in therapy session, transfers, sit > stand, extensive education on care for residual limb   Mobility   max A transfers, min A w/c mobility, max A sit <> stand  supervision w/c level  activity tolerance, pain control, transfers   Communication             Safety/Cognition/ Behavioral Observations            Pain   C/o pain to rt. BKA. on scheduled tramadol.   Pain less than or equal to 2.  Assess for pain and treat PRN.   Skin   MASD to peri area. Barrier cream applied.  No skin breakdown while in rehab.  Assess skin q shift and prn.      *  See Care Plan and progress notes for long and short-term goals.     Barriers to Discharge  Current Status/Progress Possible Resolutions Date Resolved   Physician    Medical stability;Wound Care     See above  Therapies, optimize DM meds, encourage fluids, follow labs      Nursing                  PT  Inaccessible home environment                 OT Inaccessible home environment                SLP                SW Inaccessible home environment Will need a ramp to get up the stairs and landlord [place] in [place] right [place]            Discharge  Planning/Teaching Needs:  Home with her daughter who can assist her, pt needs much encouragement to participate in therapies, wants to watch her shows.      Team Discussion:  Goals supervision wheelchair level, therapist may need to downgrade to min due to progress. Pt had a good day yesterday. Needs a ramp will check with landlord. Pain controlled and MD adjusting diabetes meds. Encourage fluids due to AKI. Daughter can assist pt at discharge and will need to come in for education  Revisions to Treatment Plan:  DC 4/2    Continued Need for Acute Rehabilitation Level of Care: The patient requires daily medical management by a physician with specialized training in physical medicine and rehabilitation for the following conditions: Daily direction of a multidisciplinary physical rehabilitation program to ensure safe treatment while eliciting the highest outcome that is of practical value to the patient.: Yes Daily medical management of patient stability for increased activity during participation in an intensive rehabilitation regime.: Yes Daily analysis of laboratory values and/or radiology reports with any subsequent need for medication adjustment of medical intervention for : Blood pressure problems;Renal problems;Post surgical problems   I attest that I was present, lead the team conference, and concur with the assessment and plan of the team.   Elease Hashimoto 03/16/2019, 8:54 AM

## 2019-03-16 NOTE — Progress Notes (Signed)
Occupational Therapy Session Note  Patient Details  Name: [name] MRN: [id number] Date of Birth: 1951-08-17  Today's Date: 03/16/2019 OT Individual Time: 0920-1000 OT Individual Time Calculation (min): 40 min  and Today's Date: 03/16/2019 OT Missed Time: 20 Minutes Missed Time Reason: Patient fatigue;Patient unwilling/refused to participate without medical reason   Short Term Goals: Week 1:  OT Short Term Goal 1 (Week 1): Pt will complete LB dressing max assist at sit > stand level OT Short Term Goal 2 (Week 1): Pt will complete bathing with mod assist at sit > stand level OT Short Term Goal 3 (Week 1): Pt will complete toilet transfer mod assist  OT Short Term Goal 4 (Week 1): Pt will complete sit > stand with mod assist to decrease burden of care with LB dressing  Skilled Therapeutic Interventions/Progress Updates:    Treatment session with focus on ADL retraining and functional mobility.  Pt received supine in bed reporting that she did not sleep well over night, but unable to identify any specific reason.  Pt declined OOB activity but agreeable to getting dressed.  Pt completed bed mobility with mod cues and increased time for initiation, due to fatigue and ?decreased processing.  Pt incontinent of urine, therefore provided hygiene at bed level and therapist donned clean incontinence brief and pants.  Pt with coughing spell when lying flat requiring increased time to catch her breath.  Pt completed grooming tasks sitting upright with setup and mod cues for sequencing and problem solving.  Pt declined further therapeutic intervention, requesting to rest.  Pt missed 20 mins therapy.  Therapy Documentation Precautions:  Precautions Precautions: Fall Precaution Comments: watch BP Restrictions Weight Bearing Restrictions: Yes RLE Weight Bearing: Non weight bearing Other Position/Activity Restrictions: Pt R NWB with new BKA General: General OT Amount of Missed Time: 20  Minutes Vital Signs:  Pain:  Pt with c/o pain in residual limb, premedicated.   Therapy/Group: Individual Therapy  Simonne Come 03/16/2019, 3:28 PM

## 2019-03-16 NOTE — Progress Notes (Addendum)
Advanced Heart Failure Rounding Note  PCP-Cardiologist: No primary care provider on file.   Subjective:    Moved to CIR 03/13/19  Cr 1.52 this am. K 4.0. SBPs in low 100s.   Feeling about the same today. Continues to complain of SOB when she "sits upright". Denies SOB with lying flat or leaning back in WC. No chest pain. Leg pain continues.   LHC/RHC (3/14): Hemodynamics:  LV 101/39 Ao 92/50 RA 14 CO (Fick) 6.28 CI (Fick) 2.92 Diagnostic  Dominance: Right  Left Main  Minimal disease.  Left Anterior Descending  Large LAD wrapping around apex. There is a patent proximal LAD stent. Diffuse luminal irregularities. There was probably a diagonal originating from the mid LAD that was occluded [place].  Ramus Intermedius  Small to moderate ramus with 95% ostial/proximal stenosis.  Left Circumflex  50% stenosis mid LCx and 50% stenosis distal LCx. 50% stenosis in proximal small-moderate OM1.  Right Coronary Artery  50% stenosis distal RCA/ostial PLV.    Objective:   Weight Range: 91.2 kg Body mass index is 29.69 kg/m.   Vital Signs:   Temp:  [98.2 F (36.8 C)-99 F (37.2 C)] 99 F (37.2 C) (03/19 0420) Pulse Rate:  [73-83] 83 (03/19 0420) Resp:  [16-19] 16 (03/19 0420) BP: (100-109)/(48-75) 109/50 (03/19 0420) SpO2:  [94 %-100 %] 94 % (03/19 0420) Last BM Date: 03/15/19  Weight change: Filed Weights   03/13/19 1721 03/15/19 0500  Weight: 92.7 kg 91.2 kg   Intake/Output:   Intake/Output Summary (Last 24 hours) at 03/16/2019 0917 Last data filed at 03/16/2019 0745 Gross per 24 hour  Intake 476 ml  Output -  Net 476 ml     Physical Exam    General: NAD HEENT: Normal Neck: Supple. JVP 5-6. Carotids 2+ bilat; no bruits. No thyromegaly or nodule noted. Cor: PMI nondisplaced. RRR, No M/G/R noted Lungs: CTAB, normal effort. Abdomen: Soft, non-tender, non-distended, no HSM. No bruits or masses. +BS  Extremities: No cyanosis, clubbing, or rash. S/p R BKA.   Neuro: Alert & orientedx3, cranial nerves grossly intact. moves all 4 extremities w/o difficulty. Affect pleasant   Telemetry   Not connected; CIR  EKG    No new tracings.    Labs    CBC Recent Labs    03/13/19 1126 03/14/19 0813  WBC 10.6* 12.6*  NEUTROABS  --  9.7*  HGB 7.9* 8.0*  HCT 26.3* 27.3*  MCV 87.1 87.5  PLT 326 413   Basic Metabolic Panel Recent Labs    03/15/19 0503 03/16/19 0443  NA 132* 129*  K 4.1 4.0  CL 94* 89*  CO2 30 26  GLUCOSE 182* 224*  BUN 32* 31*  CREATININE 1.62* 1.52*  CALCIUM 8.6* 9.1   Liver Function Tests Recent Labs    03/13/19 1126 03/14/19 0813  AST 31 32  ALT 18 19  ALKPHOS 90 97  BILITOT 0.9 1.0  PROT 6.6 6.8  ALBUMIN 2.4* 2.5*   No results for input(s): LIPASE, AMYLASE in the last 72 hours. Cardiac Enzymes No results for input(s): CKTOTAL, CKMB, CKMBINDEX, TROPONINI in the last 72 hours.  BNP: BNP (last 3 results) Recent Labs    02/09/19 1322  BNP 706.0*    ProBNP (last 3 results) No results for input(s): PROBNP in the last 8760 hours.   D-Dimer No results for input(s): DDIMER in the last 72 hours. Hemoglobin A1C No results for input(s): HGBA1C in the last 72 hours. Fasting Lipid Panel No  results for input(s): CHOL, HDL, LDLCALC, TRIG, CHOLHDL, LDLDIRECT in the last 72 hours. Thyroid Function Tests No results for input(s): TSH, T4TOTAL, T3FREE, THYROIDAB in the last 72 hours.  Invalid input(s): FREET3  Other results:   Imaging    No results found.   Medications:     Scheduled Medications: . aspirin EC  81 mg Oral Daily  . atorvastatin  40 mg Oral QHS  . cholecalciferol  2,000 Units Oral Daily  . clopidogrel  75 mg Oral Daily  . digoxin  0.125 mg Oral Daily  . gabapentin  600 mg Oral TID  . heparin  5,000 Units Subcutaneous Q8H  . insulin aspart  0-15 Units Subcutaneous TID WC  . insulin aspart  7 Units Subcutaneous TID WC  . insulin glargine  45 Units Subcutaneous BID  .  metoCLOPramide  10 mg Oral QHS  . pantoprazole  40 mg Oral Daily  . polyethylene glycol  17 g Oral BID  . sodium chloride flush  10-40 mL Intracatheter Q12H  . spironolactone  25 mg Oral Daily  . torsemide  40 mg Oral Daily  . traMADol  100 mg Oral TID    Infusions:   PRN Medications: acetaminophen, bisacodyl, clonazepam, clonazePAM, ondansetron **OR** ondansetron (ZOFRAN) IV, oxyCODONE, sodium chloride flush, sorbitol    Patient [place] a 68 y.o.femalewith h/o CAD s/p LAD stent, chronic systolic CHF, DM2, neuropathy, gastroparesis, anxiety, panic attacks, and breast cancer s/p right mastectomy.   Suffered an NSTEMI in setting of R BKA. Troponin peaked at 23. Dr. Gwenlyn Found assessed and recommended no intervention at this time with relative lack of symptoms. CHF team consulted for med titration.  Transferred to CIR 03/13/19  Assessment/Plan   1. CAD: Patient has history of prior anterior MI with LAD stent. She developed hypotension but no significant ECG changes or chest pain post-op 3/9. Troponin up to 29. Suspect peri-op NSTEMI. LHC on 3/13 showed 95% stenosis ostial/proximal small-moderate ramus and suspected ostial diagonal occlusion.  One of these vessels may have been culprit lesion, no obstructive disease in the major coronaries, no interventional target (ramus too small for stent placement).  - No s/s of ischemia.    - Continue ASA and statin.  -Continue Plavix 75 mg daily. 2. Shock: Suspect mixed septic/cardiogenic. Septic shock with osteomyelitis - Resolved. Now in CIR. Off midodrine.  - She has completed cefepime course.   3. Acute on chronic systolic CHF: Ischemic cardiomyopathy. Apparently, baseline EF is in the 25-30% range. Echo this admission with EF 20-25%.  - Volume status stable on exam.  - Off midodrine  - Continue digoxin 0.125 daily. -Continue torsemide 40 mg daily for now.  - Continue spironolactone 25 mg daily.  4.  AKI: Cr 1.71 03/13/19.  - Creatinine trending down.  - Continue torsemide 40 mg daily.  5. Anemia:  - FOBT+.  - Fe deficient. Given feraheme.  Further per primary.  - Hgb 8.0 03/14/19. No overt bleeding.  6. Osteomyelitis:  - s/p right BKA. Ortho following. Continue CIR.  Medication concerns reviewed with patient and pharmacy team. Barriers identified: Not at this time.   Length of Stay: 3  Annamaria Helling  03/16/2019, 9:17 AM  Advanced Heart Failure Team Pager [phone] (M-F; 7a - 4p)  Please contact [facility] for night-coverage after hours (4p -7a ) and weekends on [email or web address]  Patient seen with PA, agree with the above note.  She is stable today, SBP in 100s.  Not lightheaded but gets sleepy with oxycodone use.  Creatinine stable.  - Add losartan 12.5 mg qhs.  Continue digoxin, torsemide, and spironolactone.   Loralie Champagne 03/16/2019 10:44 AM

## 2019-03-16 NOTE — Progress Notes (Signed)
Physical Therapy Session Note  Patient Details  Name: [name] MRN: [id number] Date of Birth: 1951-10-10  Today's Date: 03/16/2019 PT Individual Time:Session1: 4332-9518; Arthor Captain: [id number] PT Individual Time Calculation (min): Session1: 35 min; Session2: 60 min  Short Term Goals: Week 1:  PT Short Term Goal 1 (Week 1): pt will consistently perform transfers with min A PT Short Term Goal 2 (Week 1): pt will propel w/c with supervision x 50' in controlled environment  Skilled Therapeutic Interventions/Progress Updates:   Session1: Patient in supine, reports not feeling well and seemingly slow to respond or reply at all to some questions.  Performed supine therex including quad sets x 10 max cues and encouragement to perform with continued slow response and facilitation for initiation, SLR on R x 10.  Pt then requesting to toilet.  Assisted to sit min A with rail and HOB elevated.  Squat pivot to drop arm BSC max A to L. Brief removed once on BSC.  Seated to toilet, then pt attempted hygiene seated.  Attempted slide board transfer from drop arm BSC, but pt c/o R thigh getting pinched so scooted back to L and removed board.  Max A squat pivot back to bed after moved bed for pt to reach rail.  Sit to supine min A.  Rolling mod to max A and increased time (with continued decreased initiation) for hygiene due to soiled with small BM and to replace bed pad and don a new brief.  Patient left in supine with needs and call bell in reach.   [place]:  Patient in supine reports needs to don pants again.  States feels better this pm after pain medication has worn off some.  States makes it difficult to participate at times.  Total A to don pants in supine for time and energy conservation.  Patient able to assist after pants laced onto legs and pulled partway up.  Supine to sit min a with elevated HOB.  Sit to stand in [place] to finish pulling up pants with max A from elevated surface.  Pivot to w/c  with total A in [place].  Patient in w/c propelled with min a x about 100' until c/o fatigue.  Assisted to gym and slide board transfer to mat with min A for board placement and to keep board still while pt slides across with increased time, assisted some with transfer as pt got stuck due to difficulty turning on board.  Sit to supine on mat with wedge and pillows min A.  Patient performed L single leg bridge x 10, then x 5 then bilateral bridge with legs on bolster x 10.  Performed R SLR, and trunk rotation with legs on yellow ball with assist.  Supine to sit mod A.  Seated for hip adduction with pillow between legs x 10 w/ 5 sec hold.  Slide board transfer back to w/c min A.  Patient propelled about 105' towards her room prior to fatigue with min A.  Slide board back to bed min to mod A.  Sit to supine with min A and pt requesting bedpan so assist to remove pants and brief and place bed pan. Nurse tech aware and pt with call bell and bed alarm on.   Therapy Documentation Precautions:  Precautions Precautions: Fall Precaution Comments: watch BP Restrictions Weight Bearing Restrictions: Yes RLE Weight Bearing: Non weight bearing Other Position/Activity Restrictions: Pt R NWB with new BKA Pain: Pain Assessment Pain Scale: Faces Faces Pain Scale: Hurts even more Pain  Type: Surgical pain Pain Location: Leg Pain Orientation: Right Pain Descriptors / Indicators: Grimacing;Moaning Pain Onset: With Activity Pain Intervention(s): Repositioned    Therapy/Group: Individual Therapy  Reginia Naas  Magda Kiel, PT 03/16/2019, 10:23 AM

## 2019-03-16 NOTE — Progress Notes (Signed)
Physical Therapy Session Note  Patient Details  Name: [name] MRN: [id number] Date of Birth: August 09, 1951  Today's Date: 03/16/2019 PT Individual Time: 1030-1126 PT Individual Time Calculation (min): 56 min   Short Term Goals: Week 1:  PT Short Term Goal 1 (Week 1): pt will consistently perform transfers with min A PT Short Term Goal 2 (Week 1): pt will propel w/c with supervision x 50' in controlled environment  Skilled Therapeutic Interventions/Progress Updates:    pt in bed, requesting assistance with her dentures. Pt asks to put in top piece even though it is already in. Pt then requires max A for preparing, orienting and placing bottom denture.  Pt requires mod A for supine to sit with use of bed rails.  Max A sliding board transfer to w/c.  W/c mobility during session with supervision, much increased time and mod cues for technique.  Sliding board blocked practice with initial max A, progressing to min A with repetition, then max A with fatigue. Pt performs seated push up blocks 2 x 10 with tactile cues for tricep extension.  Pt left in bed with needs at hand, alarm set.  Therapy Documentation Precautions:  Precautions Precautions: Fall Precaution Comments: watch BP Restrictions Weight Bearing Restrictions: Yes RLE Weight Bearing: Non weight bearing Other Position/Activity Restrictions: Pt R NWB with new BKA Pain: Pt with no c/o pain during session, states she has rec'd pain meds prior to session   Therapy/Group: Individual Therapy  Sophia Sperry 03/16/2019, 11:26 AM

## 2019-03-17 ENCOUNTER — Inpatient Hospital Stay (HOSPITAL_COMMUNITY): Payer: Medicare Other | Admitting: Physical Therapy

## 2019-03-17 ENCOUNTER — Inpatient Hospital Stay (HOSPITAL_COMMUNITY): Payer: Medicare Other | Admitting: Occupational Therapy

## 2019-03-17 DIAGNOSIS — D72828 Other elevated white blood cell count: Secondary | ICD-10-CM

## 2019-03-17 DIAGNOSIS — D72829 Elevated white blood cell count, unspecified: Secondary | ICD-10-CM

## 2019-03-17 LAB — BASIC METABOLIC PANEL
ANION GAP: 11 (ref 5–15)
BUN: 31 mg/dL — ABNORMAL HIGH (ref 8–23)
CO2: 26 mmol/L (ref 22–32)
Calcium: 8.6 mg/dL — ABNORMAL LOW (ref 8.9–10.3)
Chloride: 91 mmol/L — ABNORMAL LOW (ref 98–111)
Creatinine, Ser: 1.6 mg/dL — ABNORMAL HIGH (ref 0.44–1.00)
GFR calc Af Amer: 38 mL/min — ABNORMAL LOW (ref >60)
GFR, EST NON AFRICAN AMERICAN: 33 mL/min — AB (ref >60)
Glucose, Bld: 240 mg/dL — ABNORMAL HIGH (ref 70–99)
Potassium: 4.4 mmol/L (ref 3.5–5.1)
Sodium: 128 mmol/L — ABNORMAL LOW (ref 135–145)

## 2019-03-17 LAB — GLUCOSE, CAPILLARY
GLUCOSE-CAPILLARY: 175 mg/dL — AB (ref 70–99)
Glucose-Capillary: 235 mg/dL — ABNORMAL HIGH (ref 70–99)
Glucose-Capillary: 299 mg/dL — ABNORMAL HIGH (ref 70–99)
Glucose-Capillary: 195 mg/dL — ABNORMAL HIGH (ref 70–99)

## 2019-03-17 MED ORDER — OXYCODONE HCL 5 MG PO TABS
5.0000 mg | ORAL_TABLET | ORAL | Status: DC | PRN
  Administered 2019-03-23 – 2019-03-25 (×7): 5 mg via ORAL
  Filled 2019-03-17 (×7): qty 1

## 2019-03-17 MED ORDER — TRAMADOL HCL 50 MG PO TABS
100.0000 mg | ORAL_TABLET | Freq: Four times a day (QID) | ORAL | Status: DC | PRN
  Administered 2019-03-18 – 2019-03-20 (×8): 100 mg via ORAL
  Filled 2019-03-17 (×8): qty 2

## 2019-03-17 MED ORDER — INSULIN GLARGINE 100 UNIT/ML ~~LOC~~ SOLN
50.0000 [IU] | Freq: Two times a day (BID) | SUBCUTANEOUS | Status: DC
  Administered 2019-03-17 – 2019-03-28 (×22): 50 [IU] via SUBCUTANEOUS
  Filled 2019-03-17 (×24): qty 0.5

## 2019-03-17 NOTE — Progress Notes (Signed)
[facility] PHYSICAL MEDICINE & REHABILITATION PROGRESS NOTE  Subjective/Complaints: Patient seen working with therapies this morning.  She states she slept well overnight initially, then states that she slept fairly.  She states it is because she feels she was restless from the oxycodone that she took the day before.  She is questions about removing her IJ, discussed with nursing.  Stump pain appears to be improving.  She was seen by heart failure, notes reviewed.  ROS: Denies CP, shortness of breath, nausea, vomiting, diarrhea.  Objective: Vital Signs: Blood pressure (!) 99/50, pulse 72, temperature 98.5 F (36.9 C), resp. rate 18, height 5\' 9"  (1.753 m), weight 91.2 kg, SpO2 96 %. No results found. No results for input(s): WBC, HGB, HCT, PLT in the last 72 hours. Recent Labs    03/16/19 0443 03/17/19 0439  NA 129* 128*  K 4.0 4.4  CL 89* 91*  CO2 26 26  GLUCOSE 224* 240*  BUN 31* 31*  CREATININE 1.52* 1.60*  CALCIUM 9.1 8.6*    Physical Exam: BP (!) 99/50 (BP Location: Left Arm)   Pulse 72   Temp 98.5 F (36.9 C)   Resp 18   Ht 5\' 9"  (1.753 m)   Wt 91.2 kg   SpO2 96%   BMI 29.69 kg/m  Constitutional: No distress . Vital signs reviewed. HENT: Normocephalic.  Atraumatic. Eyes: EOMI. No discharge. Cardiovascular: RRR.  No JVD. Respiratory: CTA bilaterally.  Normal effort. GI: BS +. Non-distended. Musc: No edema or tenderness in extremities. Musculoskeletal: Right stump edema with tenderness, improving  Neurological: She is alert.  She follows full commands. Motor: Bilateral upper extremities: 4/5 proximal distal Left lower extremity: 4/5 proximal distal, stable, stable Right lower extremity: Hip flexion 3+/5 (pain inhibition), stable Skin: Right stump with dressing CD/I  Psychiatric: Her affect is blunt.   Assessment/Plan: 1. 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  Physiatrist is providing close team supervision and 24 hour management of active medical problems listed below.  Physiatrist and rehab team continue to assess barriers to discharge/monitor patient progress toward functional and medical goals  Care Tool:  Bathing  Bathing activity did not occur: Refused           Bathing assist       Upper Body Dressing/Undressing Upper body dressing   What is the patient wearing?: Hospital gown only    Upper body assist Assist Level: Minimal Assistance - Patient > 75%    Lower Body Dressing/Undressing Lower body dressing      What is the patient wearing?: Pants     Lower body assist Assist for lower body dressing: Total Assistance - Patient < 25%     Toileting Toileting Toileting Activity did not occur Landscape architect and hygiene only): Safety/medical concerns(pt used bedpan)  Toileting assist Assist for toileting: Maximal Assistance - Patient 25 - 49%     Transfers Chair/bed transfer  Transfers assist  Chair/bed transfer activity did not occur: Safety/medical concerns  Chair/bed transfer assist level: Maximal Assistance - Patient 25 - 49%     Locomotion Ambulation   Ambulation assist   Ambulation activity did not occur: Safety/medical concerns          Walk 10 feet activity   Assist           Walk 50 feet activity   Assist  Walk 150 feet activity   Assist           Walk 10 feet on uneven surface  activity   Assist Walk 10 feet on uneven surfaces activity did not occur: Safety/medical concerns         Wheelchair     Assist Will patient use wheelchair at discharge?: Yes Type of Wheelchair: Manual    Wheelchair assist level: Minimal Assistance - Patient > 75% Max wheelchair distance: 100'    Wheelchair 50 feet with 2 turns activity    Assist    Wheelchair 50 feet with 2 turns activity did not occur:  Safety/medical concerns   Assist Level: Minimal Assistance - Patient > 75%   Wheelchair 150 feet activity     Assist Wheelchair 150 feet activity did not occur: Safety/medical concerns          Medical Problem List and Plan: 1.  Decreased functional mobility secondary to right BKA 35/46/5681 complicated by perioperative non-STEMI/septic shock             Continue CIR 2.  Antithrombotics: -DVT/anticoagulation:  Subcutaneous heparin             -antiplatelet therapy:  Aspirin 81 mg daily, Plavix 75 mg daily 3. Pain Management:  Neurontin 600 mg 3 times a day  Tramadol changed to 100 mg 3 times a day prn on 3/20  Oxycodone as needed  Continue to provide encouragement and coping mechanisms 4. Mood: Klonopin 0.5 mg twice a day as needed             -antipsychotic agents: see above 5. Neuropsych: This patient is capable of making decisions on his own behalf. 6. Skin/Wound Care:  Routine skin checks 7. Fluids/Electrolytes/Nutrition: routine and out's  8. Acute blood loss anemia.   Hemoglobin 8.0 on 2/17  Labs ordered for Monday  Continue to monitor 9. 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 Monitor for any signs of fluid overload. Lanoxin 0.125 mg daily, Aldactone 12.5 mg daily, Demadex 40 mg daily. Patient is off ProAmatine.   Filed Weights   03/13/19 1721 03/15/19 0500  Weight: 92.7 kg 91.2 kg   Stable on 3/18, no weights today 10. Diabetes mellitus with peripheral neuropathy. Hemoglobin A1c 7.8.   Lantus insulin 60 units twice a day, decreased to 30 BID on 3/17, increased to 45 twice daily on 3/18, increased to 50 on 3/20  Cont NovoLog 7 units 3 times a day.    Monitor with increased mobility 11. Hyperlipidemia. Lipitor 12.  Supplemental oxygen dependent             Weaned supplemental oxygen  Encouraged IS 13. CAD  S/p NSTEMI  Appreciate HF recs 14. Hyponatremia  Na 128 on 3/20  Cont to monitor 15. Hypokalemia: Resolved after  supplementation 16. AKI  Cr 1.60 on 3/20  Labs ordered for Monday  Encourage fluids  Cont to monitor 17. Leukocytosis  WBCs 12.6 on 3/17  Labs ordered for tomorrow  Labs ordered for Monday  On cefepime  Afebrile  Cont to monitor 18.  Borderline hypotension  Recs per heart failure team  Continue monitor with increased mobility  LOS: 4 days A FACE TO FACE EVALUATION WAS PERFORMED  Ankit Lorie Phenix 03/17/2019, 11:19 AM

## 2019-03-17 NOTE — Progress Notes (Addendum)
Advanced Heart Failure Rounding Note  PCP-Cardiologist: No primary care provider on file.   Subjective:    Moved to CIR 03/13/19  Cr 1.60 his am. Cr 4.4. SBPs 90-100s with addition of losartan.   Feeling slightly better today. Continues to complain of SOB sensation when sitting "upright". No SOB with lying flat, sitting in WC, or working with PT. No CP. Leg pain better controlled.   LHC/RHC (3/14): Hemodynamics:  LV 101/39 Ao 92/50 RA 14 CO (Fick) 6.28 CI (Fick) 2.92 Diagnostic  Dominance: Right  Left Main  Minimal disease.  Left Anterior Descending  Large LAD wrapping around apex. There is a patent proximal LAD stent. Diffuse luminal irregularities. There was probably a diagonal originating from the mid LAD that was occluded [place].  Ramus Intermedius  Small to moderate ramus with 95% ostial/proximal stenosis.  Left Circumflex  50% stenosis mid LCx and 50% stenosis distal LCx. 50% stenosis in proximal small-moderate OM1.  Right Coronary Artery  50% stenosis distal RCA/ostial PLV.    Objective:   Weight Range: 91.2 kg Body mass index is 29.69 kg/m.   Vital Signs:   Temp:  [98.1 F (36.7 C)-98.5 F (36.9 C)] 98.5 F (36.9 C) (03/19 1947) Pulse Rate:  [72-87] 72 (03/19 1947) Resp:  [18] 18 (03/19 1947) BP: (99-115)/(50-65) 99/50 (03/19 1947) SpO2:  [91 %-96 %] 96 % (03/19 1947) Last BM Date: (P) 03/16/19  Weight change: Filed Weights   03/13/19 1721 03/15/19 0500  Weight: 92.7 kg 91.2 kg   Intake/Output:  No intake or output data in the 24 hours ending 03/17/19 0859   Physical Exam    General: NAD  HEENT: Normal Neck: Supple. JVP 6-7 cm. Carotids 2+ bilat; no bruits. No thyromegaly or nodule noted. Cor: PMI nondisplaced. RRR, No M/G/R noted Lungs: CTAB, normal effort. Abdomen: Soft, non-tender, non-distended, no HSM. No bruits or masses. +BS  Extremities: No cyanosis, clubbing, or rash. S/p R BKA.  Neuro: Alert & orientedx3, cranial nerves  grossly intact. moves all 4 extremities w/o difficulty. Affect pleasant   Telemetry   Not connected; CIR  EKG    No new tracings.    Labs    CBC No results for input(s): WBC, NEUTROABS, HGB, HCT, MCV, PLT in the last 72 hours. Basic Metabolic Panel Recent Labs    03/16/19 0443 03/17/19 0439  NA 129* 128*  K 4.0 4.4  CL 89* 91*  CO2 26 26  GLUCOSE 224* 240*  BUN 31* 31*  CREATININE 1.52* 1.60*  CALCIUM 9.1 8.6*   Liver Function Tests No results for input(s): AST, ALT, ALKPHOS, BILITOT, PROT, ALBUMIN in the last 72 hours. No results for input(s): LIPASE, AMYLASE in the last 72 hours. Cardiac Enzymes No results for input(s): CKTOTAL, CKMB, CKMBINDEX, TROPONINI in the last 72 hours.  BNP: BNP (last 3 results) Recent Labs    02/09/19 1322  BNP 706.0*    ProBNP (last 3 results) No results for input(s): PROBNP in the last 8760 hours.   D-Dimer No results for input(s): DDIMER in the last 72 hours. Hemoglobin A1C No results for input(s): HGBA1C in the last 72 hours. Fasting Lipid Panel No results for input(s): CHOL, HDL, LDLCALC, TRIG, CHOLHDL, LDLDIRECT in the last 72 hours. Thyroid Function Tests No results for input(s): TSH, T4TOTAL, T3FREE, THYROIDAB in the last 72 hours.  Invalid input(s): FREET3  Other results:   Imaging    No results found.   Medications:     Scheduled Medications: .  aspirin EC  81 mg Oral Daily  . atorvastatin  40 mg Oral QHS  . cholecalciferol  2,000 Units Oral Daily  . clopidogrel  75 mg Oral Daily  . digoxin  0.125 mg Oral Daily  . gabapentin  600 mg Oral TID  . heparin  5,000 Units Subcutaneous Q8H  . insulin aspart  0-15 Units Subcutaneous TID WC  . insulin aspart  7 Units Subcutaneous TID WC  . insulin glargine  45 Units Subcutaneous BID  . losartan  12.5 mg Oral QHS  . metoCLOPramide  10 mg Oral QHS  . pantoprazole  40 mg Oral Daily  . polyethylene glycol  17 g Oral BID  . sodium chloride flush  10-40 mL  Intracatheter Q12H  . spironolactone  25 mg Oral Daily  . torsemide  40 mg Oral Daily  . traMADol  100 mg Oral TID    Infusions:   PRN Medications: acetaminophen, bisacodyl, clonazepam, clonazePAM, ondansetron **OR** ondansetron (ZOFRAN) IV, oxyCODONE, sodium chloride flush, sorbitol    Patient Julia a 68 [name] h/o CAD s/p LAD stent, chronic systolic CHF, DM2, neuropathy, gastroparesis, anxiety, panic attacks, and breast cancer s/p right mastectomy.   Suffered an NSTEMI in setting of R BKA. Troponin peaked at 23. Dr. Gwenlyn Found assessed and recommended no intervention at this time with relative lack of symptoms. CHF team consulted for med titration.  Transferred to CIR 03/13/19  Assessment/Plan   1. CAD: Patient has history of prior anterior MI with LAD stent. She developed hypotension but no significant ECG changes or chest pain post-op 3/9. Troponin up to 29. Suspect peri-op NSTEMI. LHC on 3/13 showed 95% stenosis ostial/proximal small-moderate ramus and suspected ostial diagonal occlusion.  One of these vessels may have been culprit lesion, no obstructive disease in the major coronaries, no interventional target (ramus too small for stent placement).  - No s/s of ischemia.    - Continue ASA and statin.  -Continue Plavix 75 mg daily. 2. Chronic systolic CHF: Ischemic cardiomyopathy. Apparently, baseline EF is in the 25-30% range. Echo this admission with EF 20-25%.  - Volume status stable on exam.  - Off midodrine  - Continue losartan 12.5 mg qhs for now. No room to up-titrate with BP in 90-100s - Continue digoxin 0.125 daily. -Continue torsemide 40 mg daily for now.  - Continue spironolactone 25 mg daily.  4. AKI: Cr 1.71 03/13/19.  - Creatinine trending down.  - Continue torsemide 40 mg daily.  5. Anemia:  - FOBT+.  - Fe deficient. Given feraheme.  Further per primary.  - Hgb 8.0 03/14/19. No overt bleeding.  6.  Osteomyelitis:  - s/p right BKA. Ortho following. Continue CIR. Tentative discharge 4//2  Medication concerns reviewed with patient and pharmacy team. Barriers identified: Not at this time.   Length of Stay: 4  [name]  03/17/2019, 8:59 AM  Advanced Heart Failure Team Pager [phone] (M-F; 7a - 4p)  Please contact [facility] for night-coverage after hours (4p -7a ) and weekends on [email or web address]  Patient seen with PA, agree with the above note.  She is stable today, SBP in 100s.  Not lightheaded, still mildly short of breath with "sitting up."  Creatinine fairly stable at 1.6.  - Continue current losartan, digoxin, torsemide, and spironolactone.  - Follow BMET daily for creatinine.  - We will see Monday unless called.   [name] 03/17/2019 10:20 AM

## 2019-03-17 NOTE — Progress Notes (Addendum)
Physical Therapy Session Note  Patient Details  Name: [name] MRN: [id number] Date of Birth: 01/20/1951  Today's Date: 03/17/2019 PT Individual Time: 0700-0800 and 1100-1115 PT Individual Time Calculation (min): 60 min and 15 min   Short Term Goals: Week 1:  PT Short Term Goal 1 (Week 1): pt will consistently perform transfers with min A PT Short Term Goal 2 (Week 1): pt will propel w/c with supervision x 50' in controlled environment  Skilled Therapeutic Interventions/Progress Updates:    pt in bed and agreeable to therapy.  Pt performs supine to sit with min A.  Sliding board transfer to w/c with mod A, improved technique and initiation.  Pt performs sliding board transfer to couch in ADL apartment with mod A to couch, +2 mod A uphill back to w/c.  Attempt simulated car transfer to midsize SUV. Pt with increased anxiety, leaning backward and unable to follow cues for forward wt shift. Pt slid back into w/c safely.  W/c mobility 50' x 3 with increased time and encouragement.  Pt then states need to use toilet. Pt requires max encouragement to perform transfer with stedy. Pt requires +2 for sit <> stand in stedy to to resistance and stating "I can't do this, I don't want to do this" despite max encouragement and education on need to not use the bed pan. Pt continues with confusion, poor memory and decreased anticipatory awareness.   Session 2: Pt in bed with eyes closed, requires increased time to arouse and open eyes.  Pt states she is ready for therapy but PT recognizes that pt is unable to keep eyes open greater than 1-2 seconds at a time. PT attempts to sit pt up but pt is resistant. Pt agreeable to supine therex. Attempt supine therex but pt continues falling asleep, unable to stay awake to participate in skilled PT.  Pt missed 45 minutes.  Therapy Documentation Precautions:  Precautions Precautions: Fall Precaution Comments: watch BP Restrictions Weight Bearing  Restrictions: (P) Yes RLE Weight Bearing: Non weight bearing Other Position/Activity Restrictions: Pt R NWB with new BKA Pain:  no c/o pain   Therapy/Group: Individual Therapy  Earlyn Sylvan 03/17/2019, 8:02 AM

## 2019-03-17 NOTE — Progress Notes (Signed)
Occupational Therapy Session Note  Patient Details  Name: JEIRY BIRNBAUM MRN: [id number] Date of Birth: Apr 02, 1951  Today's Date: 03/17/2019 OT Individual Time: 1455-1610 OT Individual Time Calculation (min): 75 min   Skilled Therapeutic Interventions/Progress Updates: Much time was spent arousing patient and keeping her aroused, attentive and engaged.    She participated as follows:     Wanted to don a brief (pad under her bottom was wet).   Brief change= total assist;           Supine to edge of bed extra time and moderate assistance;        Sat edge of bed to wash her hair with wet wash cloths and dry with towels= min A to reach back of head and she c/o fatigue .    Edge of bed endurance activities for upper extremities to help increase sit to stand success and overall strengthening to increase independence with ADLs (she was unable to participate in sit to stand or edge of bed push ups in prep for w/c pushups and balance).     She required moderate asssistance for bed mobility and asked to ly back down at the end of the session as she cmplained of fatigue.   Call bell and phone within reach at the end of the session     Therapy Documentation Precautions:  Precautions Precautions: Fall Precaution Comments: watch BP Restrictions Weight Bearing Restrictions: Yes RLE Weight Bearing: Non weight bearing Other Position/Activity Restrictions: Pt R NWB with new BKA  Pain:denied   Therapy/Group: Individual Therapy  Alfredia Ferguson [facility] 03/17/2019, 7:03 PM

## 2019-03-18 LAB — BASIC METABOLIC PANEL
Anion gap: 13 (ref 5–15)
BUN: 36 mg/dL — ABNORMAL HIGH (ref 8–23)
CO2: 26 mmol/L (ref 22–32)
Calcium: 8.8 mg/dL — ABNORMAL LOW (ref 8.9–10.3)
Chloride: 92 mmol/L — ABNORMAL LOW (ref 98–111)
Creatinine, Ser: 1.43 mg/dL — ABNORMAL HIGH (ref 0.44–1.00)
GFR calc Af Amer: 43 mL/min — ABNORMAL LOW (ref >60)
GFR calc non Af Amer: 37 mL/min — ABNORMAL LOW (ref >60)
Glucose, Bld: 133 mg/dL — ABNORMAL HIGH (ref 70–99)
Potassium: 3.8 mmol/L (ref 3.5–5.1)
Sodium: 131 mmol/L — ABNORMAL LOW (ref 135–145)

## 2019-03-18 LAB — CBC WITH DIFFERENTIAL/PLATELET
Abs Immature Granulocytes: 0.08 10*3/uL — ABNORMAL HIGH (ref 0.00–0.07)
Basophils Absolute: 0 10*3/uL (ref 0.0–0.1)
Basophils Relative: 0 %
Eosinophils Absolute: 0.3 10*3/uL (ref 0.0–0.5)
Eosinophils Relative: 4 %
HEMATOCRIT: 25.8 % — AB (ref 36.0–46.0)
Hemoglobin: 7.8 g/dL — ABNORMAL LOW (ref 12.0–15.0)
IMMATURE GRANULOCYTES: 1 %
Lymphocytes Relative: 20 %
Lymphs Abs: 1.7 10*3/uL (ref 0.7–4.0)
MCH: 26.9 pg (ref 26.0–34.0)
MCHC: 30.2 g/dL (ref 30.0–36.0)
MCV: 89 fL (ref 80.0–100.0)
Monocytes Absolute: 0.7 10*3/uL (ref 0.1–1.0)
Monocytes Relative: 8 %
Neutro Abs: 5.8 10*3/uL (ref 1.7–7.7)
Neutrophils Relative %: 67 %
Platelets: 283 10*3/uL (ref 150–400)
RBC: 2.9 MIL/uL — ABNORMAL LOW (ref 3.87–5.11)
RDW: 21.5 % — ABNORMAL HIGH (ref 11.5–15.5)
WBC: 8.6 10*3/uL (ref 4.0–10.5)
nRBC: 0 % (ref 0.0–0.2)

## 2019-03-18 LAB — GLUCOSE, CAPILLARY
Glucose-Capillary: 125 mg/dL — ABNORMAL HIGH (ref 70–99)
Glucose-Capillary: 208 mg/dL — ABNORMAL HIGH (ref 70–99)
Glucose-Capillary: 150 mg/dL — ABNORMAL HIGH (ref 70–99)
Glucose-Capillary: 131 mg/dL — ABNORMAL HIGH (ref 70–99)

## 2019-03-18 NOTE — Progress Notes (Signed)
[name] is a 68 y.o. female [birth year] [id number]  Subjective: Complains of constipation. No new problems. Slept well. Feeling OK.  Objective: Vital signs in last 24 hours: Temp:  [97.7 F (36.5 C)-98.2 F (36.8 C)] 98.1 F (36.7 C) (03/21 0612) Pulse Rate:  [67-74] 68 (03/21 0612) Resp:  [16] 16 (03/21 0612) BP: (83-95)/(50-54) 83/50 (03/21 0612) SpO2:  [90 %-100 %] 92 % (03/21 0612) Weight change:  Last BM Date: 03/16/19  Intake/Output from previous day: 03/20 0701 - 03/21 0700 In: 420 [P.O.:420] Out: -  Last cbgs: CBG (last 3)  Recent Labs    03/17/19 2155 03/18/19 0614 03/18/19 1153  GLUCAP 175* 125* 208*     Physical Exam General: No apparent distress   HEENT: not dry Lungs: Normal effort. Lungs clear to auscultation, no crackles or wheezes. Cardiovascular: Regular rate and rhythm, no edema Abdomen: S/NT/ND; BS(+) Musculoskeletal:  unchanged Neurological: No new neurological deficits Wounds: Right BKA dressed Skin: clear  Aging changes Mental state: Alert, oriented, cooperative    Lab Results: BMET    Component Value Date/Time   NA 131 (L) 03/18/2019 0519   NA 139 01/03/2013 1311   NA 138 03/12/2009 1059   K 3.8 03/18/2019 0519   K 3.7 01/03/2013 1311   K 4.6 03/12/2009 1059   CL 92 (L) 03/18/2019 0519   CL 106 01/03/2013 1311   CL 96 (L) 03/12/2009 1059   CO2 26 03/18/2019 0519   CO2 22 01/03/2013 1311   CO2 28 03/12/2009 1059   GLUCOSE 133 (H) 03/18/2019 0519   GLUCOSE 92 01/03/2013 1311   GLUCOSE 280 (H) 03/12/2009 1059   BUN 36 (H) 03/18/2019 0519   BUN 13 01/03/2013 1311   BUN 13 03/12/2009 1059   CREATININE 1.43 (H) 03/18/2019 0519   CREATININE 1.17 01/03/2013 1311   CREATININE 1.3 (H) 03/12/2009 1059   CALCIUM 8.8 (L) 03/18/2019 0519   CALCIUM 9.0 01/03/2013 1311   CALCIUM 9.5 03/12/2009 1059   GFRNONAA 37 (L) 03/18/2019 0519   GFRNONAA 50 (L) 01/03/2013 1311   GFRAA 43 (L) 03/18/2019 0519   GFRAA 58 (L)  01/03/2013 1311   CBC    Component Value Date/Time   WBC 8.6 03/18/2019 0519   RBC 2.90 (L) 03/18/2019 0519   HGB 7.8 (L) 03/18/2019 0519   HGB 14.3 01/03/2013 1405   HGB 12.7 03/12/2009 1059   HCT 25.8 (L) 03/18/2019 0519   HCT 43.0 01/03/2013 1405   HCT 37.7 03/12/2009 1059   PLT 283 03/18/2019 0519   PLT 250 01/03/2013 1405   PLT 237 03/12/2009 1059   MCV 89.0 03/18/2019 0519   MCV 91 01/03/2013 1405   MCV 85 03/12/2009 1059   MCH 26.9 03/18/2019 0519   MCHC 30.2 03/18/2019 0519   RDW 21.5 (H) 03/18/2019 0519   RDW 13.7 01/03/2013 1405   RDW 14.0 03/12/2009 1059   LYMPHSABS 1.7 03/18/2019 0519   LYMPHSABS 2.2 03/12/2009 1059   MONOABS 0.7 03/18/2019 0519   EOSABS 0.3 03/18/2019 0519   EOSABS 0.1 03/12/2009 1059   BASOSABS 0.0 03/18/2019 0519   BASOSABS 0.0 03/12/2009 1059    Studies/Results: No results found.  Medications: I have reviewed the patient's current medications.  Assessment/Plan:  1. S/p right BKA on 05/30/1307 complicated by perioperative non-STEMI with shock.  CIR 2.  DVT prophylaxis with heparin 3.  Pain management with gabapentin and tramadol or oxycodone 4.  Anxiety.  Klonopin as needed 5.  Anemia.  Monitor CBC  6.  Type 2 diabetes with peripheral neuropathy.  On Lantus twice a day and a sliding scale with NovoLog 7.  Dyslipidemia.  On Lipitor 8.  Coronary artery disease status post non-STEMI.  On aspirin and Plavix 9.  Hyponatremia.  Improved.  Monitor levels 10.  Acute renal failure.  Monitor labs 11.  Leukocytosis improved currently on cefepime.  Afebrile 12.  Borderline hypotension.  Recommendations per heart failure team 13.  Constipation.  MiraLAX as needed     Length of stay, days: [place] , MD 03/18/2019, 12:37 PM

## 2019-03-19 ENCOUNTER — Inpatient Hospital Stay (HOSPITAL_COMMUNITY): Payer: Medicare Other

## 2019-03-19 ENCOUNTER — Inpatient Hospital Stay (HOSPITAL_COMMUNITY): Payer: Medicare Other | Admitting: Occupational Therapy

## 2019-03-19 LAB — BASIC METABOLIC PANEL
Anion gap: 14 (ref 5–15)
BUN: 39 mg/dL — AB (ref 8–23)
CO2: 26 mmol/L (ref 22–32)
Calcium: 8.9 mg/dL (ref 8.9–10.3)
Chloride: 93 mmol/L — ABNORMAL LOW (ref 98–111)
Creatinine, Ser: 1.36 mg/dL — ABNORMAL HIGH (ref 0.44–1.00)
GFR calc Af Amer: 46 mL/min — ABNORMAL LOW (ref >60)
GFR calc non Af Amer: 40 mL/min — ABNORMAL LOW (ref >60)
GLUCOSE: 122 mg/dL — AB (ref 70–99)
Potassium: 3.8 mmol/L (ref 3.5–5.1)
Sodium: 133 mmol/L — ABNORMAL LOW (ref 135–145)

## 2019-03-19 LAB — GLUCOSE, CAPILLARY
Glucose-Capillary: 106 mg/dL — ABNORMAL HIGH (ref 70–99)
Glucose-Capillary: 173 mg/dL — ABNORMAL HIGH (ref 70–99)
Glucose-Capillary: 147 mg/dL — ABNORMAL HIGH (ref 70–99)
Glucose-Capillary: 220 mg/dL — ABNORMAL HIGH (ref 70–99)

## 2019-03-19 NOTE — Progress Notes (Signed)
[name] is a 68 y.o. female Apr 27, 1951 [id number]  Subjective: Complains of sore hips and bottom throughout yesterday and last night.  Complains of the inability to find a comfortable position.  No new problems otherwise. Slept well. Feeling OK.  Objective: Vital signs in last 24 hours: Temp:  [97.8 F (36.6 C)-98.1 F (36.7 C)] 98.1 F (36.7 C) (03/22 0343) Pulse Rate:  [71-73] 71 (03/22 0343) Resp:  [16-20] 16 (03/22 0343) BP: (86-107)/(51-55) 86/55 (03/22 0700) SpO2:  [98 %-100 %] 98 % (03/22 0343) Weight change:  Last BM Date: 03/18/19  Intake/Output from previous day: 03/21 0701 - 03/22 0700 In: 720 [P.O.:720] Out: -  Last cbgs: CBG (last 3)  Recent Labs    03/18/19 2106 03/19/19 0635 03/19/19 1207  GLUCAP 131* 106* 173*     Physical Exam General: No apparent distress.  In a wheelchair HEENT: not dry Lungs: Normal effort. Lungs clear to auscultation, no crackles or wheezes. Cardiovascular: Regular rate and rhythm, no edema Abdomen: S/NT/ND; BS(+) Musculoskeletal:  unchanged Neurological: No new neurological deficits Wounds: Dressed Skin: clear  Aging changes Mental state: Alert, oriented, cooperative    Lab Results: BMET    Component Value Date/Time   NA 133 (L) 03/19/2019 0608   NA 139 01/03/2013 1311   NA 138 03/12/2009 1059   K 3.8 03/19/2019 0608   K 3.7 01/03/2013 1311   K 4.6 03/12/2009 1059   CL 93 (L) 03/19/2019 0608   CL 106 01/03/2013 1311   CL 96 (L) 03/12/2009 1059   CO2 26 03/19/2019 0608   CO2 22 01/03/2013 1311   CO2 28 03/12/2009 1059   GLUCOSE 122 (H) 03/19/2019 0608   GLUCOSE 92 01/03/2013 1311   GLUCOSE 280 (H) 03/12/2009 1059   BUN 39 (H) 03/19/2019 0608   BUN 13 01/03/2013 1311   BUN 13 03/12/2009 1059   CREATININE 1.36 (H) 03/19/2019 0608   CREATININE 1.17 01/03/2013 1311   CREATININE 1.3 (H) 03/12/2009 1059   CALCIUM 8.9 03/19/2019 0608   CALCIUM 9.0 01/03/2013 1311   CALCIUM 9.5 03/12/2009 1059   GFRNONAA 40 (L) 03/19/2019 0608   GFRNONAA 50 (L) 01/03/2013 1311   GFRAA 46 (L) 03/19/2019 0608   GFRAA 58 (L) 01/03/2013 1311   CBC    Component Value Date/Time   WBC 8.6 03/18/2019 0519   RBC 2.90 (L) 03/18/2019 0519   HGB 7.8 (L) 03/18/2019 0519   HGB 14.3 01/03/2013 1405   HGB 12.7 03/12/2009 1059   HCT 25.8 (L) 03/18/2019 0519   HCT 43.0 01/03/2013 1405   HCT 37.7 03/12/2009 1059   PLT 283 03/18/2019 0519   PLT 250 01/03/2013 1405   PLT 237 03/12/2009 1059   MCV 89.0 03/18/2019 0519   MCV 91 01/03/2013 1405   MCV 85 03/12/2009 1059   MCH 26.9 03/18/2019 0519   MCHC 30.2 03/18/2019 0519   RDW 21.5 (H) 03/18/2019 0519   RDW 13.7 01/03/2013 1405   RDW 14.0 03/12/2009 1059   LYMPHSABS 1.7 03/18/2019 0519   LYMPHSABS 2.2 03/12/2009 1059   MONOABS 0.7 03/18/2019 0519   EOSABS 0.3 03/18/2019 0519   EOSABS 0.1 03/12/2009 1059   BASOSABS 0.0 03/18/2019 0519   BASOSABS 0.0 03/12/2009 1059    Studies/Results: No results found.  Medications: I have reviewed the patient's current medications.  Assessment/Plan:  1.  Status post right BKA on 02/03/8340 complicated by perioperative non-STEMI with shock.  Continue with CIR 2.  DVT prophylaxis with subcu heparin  3.  Anxiety.  Continue with Klonopin as needed 4.  Pain management with gabapentin and tramadol or oxycodone as needed 5.  Anemia.  Monitoring CBC 6.  Type 2 diabetes with peripheral neuropathy.  Continue with Lantus twice a day and a sliding scale with NovoLog 7.  Dyslipidemia continue with Lipitor 8.  Coronary artery disease.  Status post non-STEMI.  On aspirin and Plavix 9.  Hyponatremia.  Improved.  Monitor labs 10.  Acute renal failure.  Monitor labs 11.  Leukocytosis, improved.  Currently on cefepime.  No fever 12.  Borderline hypotension.  Continue with recommendations per heart failure team 13.  Constipation.  Continue with MiraLAX as needed 14.  Soreness in the area of her seating bones and hip bones.  Foam  patches.  Decubitus ulcer precautions.  Consider air mattress is an option.     Length of stay, days: 6  Walker Kehr , MD 03/19/2019, 12:42 PM

## 2019-03-19 NOTE — Progress Notes (Signed)
Physical Therapy Session Note  Patient Details  Name: [name] MRN: [id number] Date of Birth: 1951/08/08  Today's Date: 03/19/2019 PT Individual Time: 1500-1555 PT Individual Time Calculation (min): 55 min  and Today's Date: 03/19/2019 PT Missed Time: 35 Minutes Missed Time Reason: Patient fatigue;Patient unwilling to participate  Short Term Goals: Week 1:  PT Short Term Goal 1 (Week 1): pt will consistently perform transfers with min A PT Short Term Goal 2 (Week 1): pt will propel w/c with supervision x 50' in controlled environment  Skilled Therapeutic Interventions/Progress Updates:  Pt supine in bed upon PT arrival, pt reports she did not sleep last night and just got comfortable in bed. Pt reports "I just got comfortable and I am not moving." Therapist will return at later session to attempt to make up time.   Pt seated in w/c upon PT arrival, agreeable to therapy tx and denies pain. Pt propelled 50 ft using B UEs working on UE strength and endurance, pt limited by fatigue. Pt performed slideboard transfer lateral scoot from w/c<>mat with min assist, verbal cues for techniques. Pt reports feeling dizzy "light headed, wavy," Therapist checked BP while sitting on mat 95/43. Pt worked on dynamic sitting balance this session including horseshoe toss x 2 trials, ball toss/ball bounce x 2 trials, hitting beach ball with 2# dowel, x 1 trial. Pt reports low back pain while sitting edge of mat, 2/10. Pt's BP checked after activity: 105/49. Pt performed slideboard transfer back to w/c with min assist and transported back to room. Pt transferred to bed with slideboard and min assist, transferred to supine with min assist. Therapist assisted pt into sidelying for pressure relief, left with needs in reach and bed alarm set.   Pt missed 35 minutes of skilled therapy tx secondary to unwilling to participate in morning session.   Therapy Documentation Precautions:  Precautions Precautions:  Fall Precaution Comments: watch BP Restrictions Weight Bearing Restrictions: Yes RLE Weight Bearing: Non weight bearing Other Position/Activity Restrictions: Pt R NWB with new BKA   Therapy/Group: Individual Therapy  Netta Corrigan, PT, DPT 03/19/2019, 7:56 AM

## 2019-03-19 NOTE — Progress Notes (Signed)
Occupational Therapy Session Note  Patient Details  Name: [name] MRN: [id number] Date of Birth: September 30, 1951  Today's Date: 03/19/2019 OT Individual Time: [phone] and 4782-9562 OT Individual Time Calculation (min): 56 min and 42 minutes  Short Term Goals: Week 1:  OT Short Term Goal 1 (Week 1): Pt will complete LB dressing max assist at sit > stand level OT Short Term Goal 2 (Week 1): Pt will complete bathing with mod assist at sit > stand level OT Short Term Goal 3 (Week 1): Pt will complete toilet transfer mod assist  OT Short Term Goal 4 (Week 1): Pt will complete sit > stand with mod assist to decrease burden of care with LB dressing  Skilled Therapeutic Interventions/Progress Updates:    Session One: Pt seen for OT session focusing on ADL re-training, education and functional transfers. Pt awake in supine upon arrival eating breakfast, denying pain and agreeable to tx session. She denied bathing this morning opting to change clothes only.  She transferred to sitting EOB with min A using hospital bed functions with multi-modal cuing required for sequencing of mobility. She threaded LEs into pants seated EOB with close supervision and VCs. Pt then voicing need to have BM. She required lots of encouragement and education for getting to [facility] as pt wanting to use bedpan and refusing use of bed pan. With lots of encouragement and education pt willing to attempt STEDY. STEDY placed without touching [name] shin in order to allow space for R residual limb whiuch pt reports hurts when using STEDY. She was able to stand into STEDY from highly elevated EOB and transitioned to [facility] with total A for clothing management and mod A for controlled descent onto [facility]. Pt with continent B&B void on BSC. Complaints of dizziness while seated, BP 86/55. RN made aware, pt symptoms dissipated following extended time on BSC. She required +2 assist to stand from [facility] into STEDY and for hygiene and clothing  management to be completed. Pt abruptly sitting, not tolerating more than ~10 seconds in standing before requiring to sit. PT required VCs for deep breathing techniques as becoming increasingly agitated and desiring to get off BSC. Ultimately +2 total A for toileting and transfer back to w/c. She completed grooming tasks from w/c level at sink with set-up/supervision.  With encouragement, pt willing to stay sitting up in w/c at end of session. All needs in reach and chair belt alarm on.   Session Two: Pt seen for OT session focusing on functional transfers and education. Pt sitting up in bed upon arrival with daughter present. Pt willing to participate in therapy and denied pain. She transferred to sitting EOB with supervision using hospital bed functions. She completed mod A sliding board transfer to w/c. She refused sliding board transfer to R, only willing to complete going to [name] despite education and examples of needing to be able to transfer in both direction. She self propelled w/c ~7ft with cuing for effective w/c propulsion techniques. Taken remainder of way to gym for time and energy conservation. Initiated family ed regarding w/c parts management and placement of sliding board. Upon pt attempting slide board she voiced urgent need to have BM. Pt taken back to room and used STEDY to transfer to [facility]. Mod A to stand into STEDY, total A for toileting task. Pt required one seated rest break during hygiene/clothing management portion. Pt returned to w/c at end of session, chair belt alarm on. Education provided throughout session regarding w/c parts management,  sliding board transfers, benefits of OOB, benefits of upright toileting and d/c planning.   Therapy Documentation Precautions:  Precautions Precautions: Fall Precaution Comments: watch BP Restrictions Weight Bearing Restrictions: Yes RLE Weight Bearing: Non weight bearing Other Position/Activity Restrictions: Pt R NWB with new  BKA   Therapy/Group: Individual Therapy  [name] 03/19/2019, 6:37 AM

## 2019-03-20 ENCOUNTER — Inpatient Hospital Stay (HOSPITAL_COMMUNITY): Payer: Medicare Other | Admitting: Occupational Therapy

## 2019-03-20 ENCOUNTER — Inpatient Hospital Stay (HOSPITAL_COMMUNITY): Payer: Medicare Other | Admitting: Physical Therapy

## 2019-03-20 DIAGNOSIS — E871 Hypo-osmolality and hyponatremia: Secondary | ICD-10-CM

## 2019-03-20 LAB — CBC WITH DIFFERENTIAL/PLATELET
Abs Immature Granulocytes: 0.06 10*3/uL (ref 0.00–0.07)
BASOS PCT: 1 %
Basophils Absolute: 0 10*3/uL (ref 0.0–0.1)
Eosinophils Absolute: 0.2 10*3/uL (ref 0.0–0.5)
Eosinophils Relative: 4 %
HCT: 28.8 % — ABNORMAL LOW (ref 36.0–46.0)
Hemoglobin: 8.6 g/dL — ABNORMAL LOW (ref 12.0–15.0)
Immature Granulocytes: 1 %
Lymphocytes Relative: 23 %
Lymphs Abs: 1.4 10*3/uL (ref 0.7–4.0)
MCH: 26.7 pg (ref 26.0–34.0)
MCHC: 29.9 g/dL — ABNORMAL LOW (ref 30.0–36.0)
MCV: 89.4 fL (ref 80.0–100.0)
Monocytes Absolute: 0.6 10*3/uL (ref 0.1–1.0)
Monocytes Relative: 10 %
Neutro Abs: 3.7 10*3/uL (ref 1.7–7.7)
Neutrophils Relative %: 61 %
Platelets: 329 10*3/uL (ref 150–400)
RBC: 3.22 MIL/uL — ABNORMAL LOW (ref 3.87–5.11)
RDW: 20.8 % — ABNORMAL HIGH (ref 11.5–15.5)
WBC: 6 10*3/uL (ref 4.0–10.5)
nRBC: 0 % (ref 0.0–0.2)

## 2019-03-20 LAB — BASIC METABOLIC PANEL
Anion gap: 12 (ref 5–15)
BUN: 33 mg/dL — ABNORMAL HIGH (ref 8–23)
CO2: 26 mmol/L (ref 22–32)
Calcium: 8.7 mg/dL — ABNORMAL LOW (ref 8.9–10.3)
Chloride: 94 mmol/L — ABNORMAL LOW (ref 98–111)
Creatinine, Ser: 1.46 mg/dL — ABNORMAL HIGH (ref 0.44–1.00)
GFR calc Af Amer: 42 mL/min — ABNORMAL LOW (ref >60)
GFR, EST NON AFRICAN AMERICAN: 36 mL/min — AB (ref >60)
Glucose, Bld: 151 mg/dL — ABNORMAL HIGH (ref 70–99)
Potassium: 4 mmol/L (ref 3.5–5.1)
Sodium: 132 mmol/L — ABNORMAL LOW (ref 135–145)

## 2019-03-20 LAB — GLUCOSE, CAPILLARY
Glucose-Capillary: 139 mg/dL — ABNORMAL HIGH (ref 70–99)
Glucose-Capillary: 202 mg/dL — ABNORMAL HIGH (ref 70–99)
Glucose-Capillary: 160 mg/dL — ABNORMAL HIGH (ref 70–99)
Glucose-Capillary: 163 mg/dL — ABNORMAL HIGH (ref 70–99)

## 2019-03-20 NOTE — Progress Notes (Signed)
Occupational Therapy Session Note  Patient Details  Name: [name] MRN: [id number] Date of Birth: 12-Dec-1951  Today's Date: 03/20/2019 OT Individual Time: 0730-0830 OT Individual Time Calculation (min): 60 min    Short Term Goals: Week 1:  OT Short Term Goal 1 (Week 1): Pt will complete LB dressing max assist at sit > stand level OT Short Term Goal 2 (Week 1): Pt will complete bathing with mod assist at sit > stand level OT Short Term Goal 3 (Week 1): Pt will complete toilet transfer mod assist  OT Short Term Goal 4 (Week 1): Pt will complete sit > stand with mod assist to decrease burden of care with LB dressing  Skilled Therapeutic Interventions/Progress Updates:    Treatment session with focus on ADL retraining and functional transfers.  Pt received upright in bed reporting no pain and willing to engage in therapy session.  Pt reports need to toilet.  Pt completed bed mobility to sitting EOB with CGA.  Utilized Stedy for transfer to [facility].  Pt able to pull up to standing in [place] with min assist from elevated EOB and mod assist from toilet.  Pt tolerated standing during hygiene and clothing management demonstrating increased standing tolerance and decreased c/o pain during transitional movements.  Engaged in [place] bathing while seated on [facility] 2 with setup assist for items.  Completed LB dressing from EOB with cues for sequencing to attempt threading BLE, still requiring assistance when threading pants.  Sit > stand in [place] for LB dressing with pt able to maintain standing while therapist pulled pants over hips.  Engaged in transfer training with slide board in therapy gym.  Therapist educated on proper setup of slide board.  Pt able to complete transfers Rt and Lt with min assist and set up of slide board.  Engaged in 3 sets of 10 chest presses and overhead presses with 2# dowel rod when sitting edge of mat for UE strengthening as needed for transfers.  Pt remained upright in w/c with seat  belt alarm on and all needs in reach.  Therapy Documentation Precautions:  Precautions Precautions: Fall Precaution Comments: watch BP Restrictions Weight Bearing Restrictions: Yes RLE Weight Bearing: Non weight bearing Other Position/Activity Restrictions: Pt R NWB with new BKA General:   Vital Signs: Therapy Vitals Temp: 98.2 F (36.8 C) Temp Source: Oral Pulse Rate: 63 Resp: 17 BP: (!) 104/58 Patient Position (if appropriate): Lying Oxygen Therapy SpO2: 96 % O2 Device: Room Air Pain: Pain Assessment Pain Scale: 0-10 Pain Score: 0-No pain   Therapy/Group: Individual Therapy  Simonne Come 03/20/2019, 8:35 AM

## 2019-03-20 NOTE — Progress Notes (Signed)
Occupational Therapy Session Note  Patient Details  Name: [name] MRN: [id number] Date of Birth: [birth year]  Today's Date: 03/20/2019 OT Individual Time: 1400-1448 OT Individual Time Calculation (min): 48 min  and Today's Date: 03/20/2019 OT Missed Time: 27 Minutes Missed Time Reason: Patient fatigue;Patient unwilling/refused to participate without medical reason   Short Term Goals: Week 1:  OT Short Term Goal 1 (Week 1): Pt will complete LB dressing max assist at sit > stand level OT Short Term Goal 2 (Week 1): Pt will complete bathing with mod assist at sit > stand level OT Short Term Goal 3 (Week 1): Pt will complete toilet transfer mod assist  OT Short Term Goal 4 (Week 1): Pt will complete sit > stand with mod assist to decrease burden of care with LB dressing  Skilled Therapeutic Interventions/Progress Updates:    Treatment session with focus on functional mobility, transfers, and sit > stand.  Pt received supine in bed reporting fatigue but willing to attempt therapy session.  Pt declined any OOB activity this session.  Pt completed bed mobility to come to sitting at EOB with supervision utilizing hospital bed rails.  Discussed pain management for residual limb and desensitization strategies.  Pt asking when surgery will f/u regarding splint.  Engaged in sit > partial stand x5 for tricep strengthening as needed for transfers and sit > stand, followed by sit > stand x2 with mod assist.  Pt refused any further attempts at standing, stating "I'm proud of myself" and "no more".  Therapist located wide drop arm BSC and educated pt on transfers to wide drop arm BSC with improved safety and placement of slide board.  Pt reports she will not complete any transfers at this time, but appreciative of therapist obtaining drop arm BSC.  Pt returned to supine in bed, utilizing LLE to boost up in bed.  Pt left with all needs in reach.  Therapy Documentation Precautions:   Precautions Precautions: Fall Precaution Comments: watch BP Restrictions Weight Bearing Restrictions: Yes RLE Weight Bearing: Non weight bearing Other Position/Activity Restrictions: Pt R NWB with new BKA General: General OT Amount of Missed Time: 27 Minutes Vital Signs: Therapy Vitals Temp: 98 F (36.7 C) Temp Source: Oral Pulse Rate: 65 Resp: 16 BP: (!) 101/43 Patient Position (if appropriate): Lying Oxygen Therapy SpO2: 95 % O2 Device: Room Air Pain: Pt with no c/o pain  Therapy/Group: Individual Therapy  Simonne Come 03/20/2019, 3:28 PM

## 2019-03-20 NOTE — Progress Notes (Signed)
Physical Therapy Session Note  Patient Details  Name: [name] MRN: [id number] Date of Birth: February 17, 1951  Today's Date: 03/20/2019 PT Individual Time: 0900-1000 PT Individual Time Calculation (min): 60 min   Short Term Goals: Week 1:  PT Short Term Goal 1 (Week 1): pt will consistently perform transfers with min A PT Short Term Goal 2 (Week 1): pt will propel w/c with supervision x 50' in controlled environment  Skilled Therapeutic Interventions/Progress Updates:  Pt presented in w/c agreeable to therapy. Pt denies pain unless when placed in dependent position. Pt propelled w/c x 40 ft with encouragement and verbal cues to increase shoulder extension for more efficient propulsion. Pt performed SB transfer to mat (R) requiring modA. PTA encouraged pt to assist in verbalizing steps however pt stating "I don't know how to do that", however pt was able to correct PTA when asked if SB was placed correctly or if arm rest needed to be moved in order to place SB. Once at mat pt stating increased wooziness and pt stating unable to complete additional tasks. Pt stating feeling fatigued and requesting to return to w/c. After rest pt performed SB transfer to L CGA with verbal cues for increasing flexed trunk. Pt transported back to room and pt participated in following therex with LLE: LAQ 2lb cuff, hip flexion 2lb cuff, hip abd 2lb cuff and manually resisted hamstring curls all performed 2 x 10. During session pt expression anxiety and depression regarding loss of limb. Pt indicated woke up last night with significantly increased anxiety regarding loss of limb and did not know how to cope with feelings. PTA asked pt if had had visit to Neuropsychologist and pt indicated had not (pt did have visit last week). Pt indicated however would like to have mtg will neuro phyche if possible to help work through feelings, advised [place], [place]. Pt requesting to return to bed, performed SB transfer to bed and pt  able to perform sit to supine with CGA and increased time. Pt repositioned to comfort and left with call bell within reach, bed alarm on, and needs met.      Therapy Documentation Precautions:  Precautions Precautions: Fall Precaution Comments: watch BP Restrictions Weight Bearing Restrictions: Yes RLE Weight Bearing: Non weight bearing Other Position/Activity Restrictions: Pt R NWB with new BKA General:   Vital Signs: Therapy Vitals Temp: 98 F (36.7 C) Temp Source: Oral Pulse Rate: 65 Resp: 16 BP: (!) 101/43 Patient Position (if appropriate): Lying Oxygen Therapy SpO2: 95 % O2 Device: Room Air    Therapy/Group: Individual Therapy  Tavia Stave  Efrat Zuidema, PTA  03/20/2019, 4:16 PM

## 2019-03-20 NOTE — Progress Notes (Signed)
[facility] PHYSICAL MEDICINE & REHABILITATION PROGRESS NOTE  Subjective/Complaints: Patient seen working with therapies this morning. She states she slept well overnight.  She states she had a good weekend.  ROS: Denies CP, shortness of breath, nausea, vomiting, diarrhea.  Objective: Vital Signs: Blood pressure (!) 104/58, pulse 63, temperature 98.2 F (36.8 C), temperature source Oral, resp. rate 17, height 5\' 9"  (1.753 m), weight 91.4 kg, SpO2 96 %. No results found. Recent Labs    03/18/19 0519  WBC 8.6  HGB 7.8*  HCT 25.8*  PLT 283   Recent Labs    03/19/19 0608 03/20/19 0633  NA 133* 132*  K 3.8 4.0  CL 93* 94*  CO2 26 26  GLUCOSE 122* 151*  BUN 39* 33*  CREATININE 1.36* 1.46*  CALCIUM 8.9 8.7*    Physical Exam: BP (!) 104/58 (BP Location: Left Arm)   Pulse 63   Temp 98.2 F (36.8 C) (Oral)   Resp 17   Ht 5\' 9"  (1.753 m)   Wt 91.4 kg   SpO2 96%   BMI 29.76 kg/m  Constitutional: No distress . Vital signs reviewed. HENT: Normocephalic.  Atraumatic. Eyes: EOMI. No discharge. Cardiovascular: RRR. No JVD. Respiratory: CTA bilaterally. Normal effort. GI: BS +. Non-distended. Musc: No edema or tenderness in extremities. Musculoskeletal: Right stump edema with tenderness, improving  Neurological: She is alert.  She follows full commands. Motor: Bilateral upper extremities: 4-4+/5 proximal distal Left lower extremity: 4/5 proximal distal, stable Right lower extremity: Hip flexion 4-/5 (pain inhibition) Sensation intact to light touch left foot Skin: Right stump with dressing CD/I  Psychiatric: Her affect is blunt.   Assessment/Plan: 1. 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  Physiatrist is providing close team supervision and 24 hour management of active medical problems listed below.  Physiatrist and rehab  team continue to assess barriers to discharge/monitor patient progress toward functional and medical goals  Care Tool:  Bathing  Bathing activity did not occur: Refused Body parts bathed by patient: Right arm, Left arm, Chest, Abdomen, Face         Bathing assist Assist Level: Supervision/Verbal cueing     Upper Body Dressing/Undressing Upper body dressing   What is the patient wearing?: Pull over shirt    Upper body assist Assist Level: Supervision/Verbal cueing    Lower Body Dressing/Undressing Lower body dressing      What is the patient wearing?: Pants     Lower body assist Assist for lower body dressing: Dependent - Patient 0%     Toileting Toileting Toileting Activity did not occur (Clothing management and hygiene only): Safety/medical concerns(pt used bedpan)  Toileting assist Assist for toileting: Total Assistance - Patient < 25%     Transfers Chair/bed transfer  Transfers assist  Chair/bed transfer activity did not occur: Safety/medical concerns  Chair/bed transfer assist level: Minimal Assistance - Patient > 75%     Locomotion Ambulation   Ambulation assist   Ambulation activity did not occur: Safety/medical concerns          Walk 10 feet activity   Assist           Walk 50 feet activity   Assist           Walk 150 feet activity   Assist           Walk 10 feet on uneven surface  activity   Assist Walk 10 feet on uneven surfaces activity did not occur: Safety/medical concerns         Wheelchair     Assist Will patient use wheelchair at discharge?: Yes Type of Wheelchair: Manual    Wheelchair assist level: Supervision/Verbal cueing Max wheelchair distance: 50 ft    Wheelchair 50 feet with 2 turns activity    Assist    Wheelchair 50 feet with 2 turns activity did not occur: Safety/medical concerns   Assist Level: Minimal Assistance - Patient > 75%   Wheelchair 150 feet activity     Assist  Wheelchair 150 feet activity did not occur: Safety/medical concerns          Medical Problem List and Plan: 1.  Decreased functional mobility secondary to right BKA 97/35/3299 complicated by perioperative non-STEMI/septic shock             Continue CIR  Weekend notes reviewed 2.  Antithrombotics: -DVT/anticoagulation:  Subcutaneous heparin             -antiplatelet therapy:  Aspirin 81 mg daily, Plavix 75 mg daily 3. Pain Management:  Neurontin 600 mg 3 times a day  Tramadol changed to 100 mg 3 times a day prn on 3/20  Oxycodone as needed  Continue to provide encouragement and coping mechanisms 4. Mood: Klonopin 0.5 mg twice a day as needed             -antipsychotic agents: see above 5. Neuropsych: This patient is capable of making decisions on his own behalf. 6. Skin/Wound Care:  Routine skin checks 7. Fluids/Electrolytes/Nutrition: routine and out's  8. Acute blood loss anemia.   Hemoglobin 8.0 on 2/17  Labs pending  Continue to monitor 9. 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 Monitor for any signs of fluid overload. Lanoxin 0.125 mg daily, Aldactone 12.5 mg daily, Demadex 40 mg daily. Patient is off ProAmatine.   Filed Weights   03/13/19 1721 03/15/19 0500 03/20/19 0500  Weight: 92.7 kg 91.2 kg 91.4 kg   Stable on 3/23 10. Diabetes mellitus with peripheral neuropathy. Hemoglobin A1c 7.8.   Lantus insulin 60 units twice a day, decreased to 30 BID on 3/17, increased to 45 twice daily on 3/18, increased to 50 on 3/20  Cont NovoLog 7 units 3 times a day.    Slightly labile on 3/23  Monitor with increased mobility 11. Hyperlipidemia. Lipitor 12.  Supplemental oxygen dependent             Weaned supplemental oxygen  Encouraged IS 13. CAD  S/p NSTEMI  Appreciate HF recs 14. Hyponatremia  Na 132 on 3/23  Cont to monitor 15. Hypokalemia: Resolved after supplementation 16. AKI  Cr 1.46 on 3/23  Encourage fluids  Cont to monitor 17.  Leukocytosis: Resolved  WBCs 8.6 on 3/21  Labs pending  Afebrile  Cont to monitor 18.  Borderline hypotension  Recs per heart failure team  Continue monitor with increased mobility  Stable on 3/23  LOS: 7 days A FACE TO FACE EVALUATION WAS PERFORMED  Ankit Lorie Phenix 03/20/2019, 10:09 AM

## 2019-03-21 ENCOUNTER — Inpatient Hospital Stay (HOSPITAL_COMMUNITY): Payer: Medicare Other | Admitting: Physical Therapy

## 2019-03-21 ENCOUNTER — Encounter (HOSPITAL_COMMUNITY): Payer: Medicare Other | Admitting: Psychology

## 2019-03-21 ENCOUNTER — Inpatient Hospital Stay (HOSPITAL_COMMUNITY): Payer: Medicare Other | Admitting: Occupational Therapy

## 2019-03-21 DIAGNOSIS — I959 Hypotension, unspecified: Secondary | ICD-10-CM

## 2019-03-21 DIAGNOSIS — I9589 Other hypotension: Secondary | ICD-10-CM

## 2019-03-21 LAB — DIGOXIN LEVEL: Digoxin Level: 0.8 ng/mL (ref 0.8–2.0)

## 2019-03-21 LAB — BASIC METABOLIC PANEL
Anion gap: 16 — ABNORMAL HIGH (ref 5–15)
BUN: 24 mg/dL — ABNORMAL HIGH (ref 8–23)
CALCIUM: 9.1 mg/dL (ref 8.9–10.3)
CO2: 25 mmol/L (ref 22–32)
Chloride: 93 mmol/L — ABNORMAL LOW (ref 98–111)
Creatinine, Ser: 1.31 mg/dL — ABNORMAL HIGH (ref 0.44–1.00)
GFR calc Af Amer: 48 mL/min — ABNORMAL LOW (ref >60)
GFR calc non Af Amer: 41 mL/min — ABNORMAL LOW (ref >60)
Glucose, Bld: 90 mg/dL (ref 70–99)
Potassium: 3.7 mmol/L (ref 3.5–5.1)
Sodium: 134 mmol/L — ABNORMAL LOW (ref 135–145)

## 2019-03-21 LAB — GLUCOSE, CAPILLARY
Glucose-Capillary: 108 mg/dL — ABNORMAL HIGH (ref 70–99)
Glucose-Capillary: 134 mg/dL — ABNORMAL HIGH (ref 70–99)
Glucose-Capillary: 150 mg/dL — ABNORMAL HIGH (ref 70–99)
Glucose-Capillary: 169 mg/dL — ABNORMAL HIGH (ref 70–99)

## 2019-03-21 MED ORDER — TRAMADOL HCL 50 MG PO TABS
50.0000 mg | ORAL_TABLET | Freq: Four times a day (QID) | ORAL | Status: DC | PRN
  Administered 2019-03-21 – 2019-03-26 (×7): 50 mg via ORAL
  Filled 2019-03-21 (×9): qty 1

## 2019-03-21 NOTE — Progress Notes (Signed)
Physical Therapy Weekly Progress Note  Patient Details  Name: [name] MRN: [id number] Date of Birth: 1951/07/11  Beginning of progress report period: March 14, 2019 End of progress report period: March 21, 2019  Today's Date: 03/21/2019 PT Individual Time: 5488-3014 PT Individual Time Calculation (min): 70 min   Pt in w/c agreeable to therapy.  Pt propels w/c 50' x 2 during session with supervision and max encouragement.  Pt performs sliding board transfer blocked practice with pt progressing from mod to min A with repetition.  Pt performs sit <> stand x 5 with mod A, pt able to perform x 45 seconds at a time.  Simulated car transfer to SUV height with mod A with stand pivot transfer with RW.  Pt then needs to use BSC.  Pt transfers with mod A with stedy. Total A for clothing management and hygiene. Pt left in bed with nursing present, alarm set, needs at hand.  Patient has met 1 of 2 short term goals.  Pt making slow progress towards therapy goals.  Long term goals downgraded to min A.  Pt currently mod A with sliding board and stand pivot transfers.  Limited by decreased participation and motivation.  Patient continues to demonstrate the following deficits muscle weakness, decreased awareness, decreased memory and delayed processing and decreased sitting balance, decreased standing balance, decreased postural control and decreased balance strategies and therefore will continue to benefit from skilled PT intervention to increase functional independence with mobility.  Patient not progressing toward long term goals.  See goal revision..  Continue plan of care.  PT Short Term Goals Week 1:  PT Short Term Goal 1 (Week 1): pt will consistently perform transfers with min A PT Short Term Goal 1 - Progress (Week 1): Progressing toward goal PT Short Term Goal 2 (Week 1): pt will propel w/c with supervision x 50' in controlled environment PT Short Term Goal 2 - Progress (Week 1):  Met Week 2:  PT Short Term Goal 1 (Week 2): = LTG  Skilled Therapeutic Interventions/Progress Updates:  Ambulation/gait training;Community reintegration;DME/adaptive equipment instruction;Neuromuscular re-education;Stair training;UE/LE Strength taining/ROM;Wheelchair propulsion/positioning;Balance/vestibular training;Discharge planning;Pain management;Therapeutic Activities;UE/LE Coordination activities;Functional mobility training;Patient/family education;Splinting/orthotics;Therapeutic Exercise   Therapy Documentation Precautions:  Precautions Precautions: Fall Precaution Comments: watch BP Restrictions Weight Bearing Restrictions: Yes RLE Weight Bearing: Non weight bearing Other Position/Activity Restrictions: Pt R NWB with new BKA Pain: Pt c/o residual limb pain when in dependent position, improves when out of position  Therapy/Group: Individual Therapy  Analese Sovine 03/21/2019, 10:01 AM

## 2019-03-21 NOTE — Progress Notes (Signed)
[facility] PHYSICAL MEDICINE & REHABILITATION PROGRESS NOTE  Subjective/Complaints: Patient seen laying in bed this morning.  She states she slept well overnight.  She does not remember me.  She has questions regarding dressing changes.  ROS: Denies CP, shortness of breath, nausea, vomiting, diarrhea.  Objective: Vital Signs: Blood pressure (!) 92/55, pulse 72, temperature 98 F (36.7 C), temperature source Oral, resp. rate 12, height 5\' 9"  (1.753 m), weight 89.6 kg, SpO2 100 %. No results found. Recent Labs    03/20/19 1012  WBC 6.0  HGB 8.6*  HCT 28.8*  PLT 329   Recent Labs    03/20/19 0633 03/21/19 0555  NA 132* 134*  K 4.0 3.7  CL 94* 93*  CO2 26 25  GLUCOSE 151* 90  BUN 33* 24*  CREATININE 1.46* 1.31*  CALCIUM 8.7* 9.1    Physical Exam: BP (!) 92/55 (BP Location: Left Arm) Comment: rn notified   Pulse 72   Temp 98 F (36.7 C) (Oral)   Resp 12   Ht 5\' 9"  (1.753 m)   Wt 89.6 kg   SpO2 100%   BMI 29.18 kg/m  Constitutional: No distress . Vital signs reviewed. HENT: Normocephalic.  Atraumatic. Eyes: EOMI. No discharge. Cardiovascular: RRR.  No JVD. Respiratory: CTA bilaterally.  Normal effort. GI: BS +. Non-distended. Musc: No edema or tenderness in extremities. Musculoskeletal: Right stump edema with tenderness  Neurological: She is alert and oriented x3.  She follows full commands. Motor: Bilateral upper extremities: 4-4+/5 proximal distal, stable Left lower extremity: 4/5 proximal distal, 8, Right lower extremity: Hip flexion 4-/5 (pain inhibition) Sensation intact to light touch left foot Skin: Right stump with dressing CD/I  Psychiatric: Her affect is blunt.   Assessment/Plan: 1. 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  Physiatrist is providing close team supervision and 24 hour management of active  medical problems listed below.  Physiatrist and rehab team continue to assess barriers to discharge/monitor patient progress toward functional and medical goals  Care Tool:  Bathing  Bathing activity did not occur: Refused Body parts bathed by patient: Right arm, Left arm, Chest, Abdomen, Face   Body parts bathed by helper: Front perineal area, Buttocks Body parts n/a: Right lower leg(Rt BKA)   Bathing assist Assist Level: Moderate Assistance - Patient 50 - 74%     Upper Body Dressing/Undressing Upper body dressing   What is the patient wearing?: Pull over shirt    Upper body assist Assist Level: Set up assist    Lower Body Dressing/Undressing Lower body dressing      What is the patient wearing?: Pants     Lower body assist Assist for lower body dressing: Moderate Assistance - Patient 50 - 74%     Toileting Toileting Toileting Activity did not occur Landscape architect and hygiene only): Safety/medical concerns(pt used bedpan)  Toileting assist Assist for toileting: Total Assistance - Patient < 25%     Transfers Chair/bed transfer  Transfers assist  Chair/bed transfer activity did not occur: Safety/medical concerns  Chair/bed transfer assist level: Minimal Assistance - Patient > 75%     Locomotion Ambulation   Ambulation assist   Ambulation activity did not occur: Safety/medical concerns          Walk 10 feet activity   Assist           Walk 50 feet activity  Assist           Walk 150 feet activity   Assist           Walk 10 feet on uneven surface  activity   Assist Walk 10 feet on uneven surfaces activity did not occur: Safety/medical concerns         Wheelchair     Assist Will patient use wheelchair at discharge?: Yes Type of Wheelchair: Manual    Wheelchair assist level: Supervision/Verbal cueing Max wheelchair distance: 50 ft    Wheelchair 50 feet with 2 turns activity    Assist    Wheelchair 50  feet with 2 turns activity did not occur: Safety/medical concerns   Assist Level: Minimal Assistance - Patient > 75%   Wheelchair 150 feet activity     Assist Wheelchair 150 feet activity did not occur: Safety/medical concerns          Medical Problem List and Plan: 1.  Decreased functional mobility secondary to right BKA 93/57/0177 complicated by perioperative non-STEMI/septic shock             Continue CIR 2.  Antithrombotics: -DVT/anticoagulation:  Subcutaneous heparin             -antiplatelet therapy:  Aspirin 81 mg daily, Plavix 75 mg daily 3. Pain Management:  Neurontin 600 mg 3 times a day  Tramadol changed to 100 mg 3 times a day prn on 3/20, decreased to 50 on 3/24  Oxycodone as needed  Continue to provide encouragement and coping mechanisms 4. Mood: Klonopin 0.5 mg twice a day as needed             -antipsychotic agents: see above 5. Neuropsych: This patient is capable of making decisions on his own behalf. 6. Skin/Wound Care:  Routine skin checks  Will follow up on wound care 7. Fluids/Electrolytes/Nutrition: routine and out's  8. Acute blood loss anemia.   Hemoglobin 8.6 on 3/24  Continue to monitor 9. 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 Monitor for any signs of fluid overload. Lanoxin 0.125 mg daily, Aldactone 12.5 mg daily, Demadex 40 mg daily. Patient is off ProAmatine.   Filed Weights   03/15/19 0500 03/20/19 0500 03/21/19 0431  Weight: 91.2 kg 91.4 kg 89.6 kg   Stable on 3/24 10. Diabetes mellitus with peripheral neuropathy. Hemoglobin A1c 7.8.   Lantus insulin 60 units twice a day, decreased to 30 BID on 3/17, increased to 45 twice daily on 3/18, increased to 50 on 3/20  Cont NovoLog 7 units 3 times a day.    Improving on 3/24  Monitor with increased mobility 11. Hyperlipidemia. Lipitor 12.  Supplemental oxygen dependent             Weaned supplemental oxygen  Encouraged IS 13. CAD  S/p NSTEMI  Appreciate HF  recs 14. Hyponatremia  Na 134 on 3/24  Cont to monitor 15. Hypokalemia: Resolved after supplementation 16. AKI  Cr 1.31 on 3/24  Encourage fluids  Cont to monitor 17. Leukocytosis: Resolved  Cont to monitor 18.  Borderline hypotension  Asymptomatic at present  Recs per heart failure team  Continue monitor with increased mobility  Stable on 3/23  LOS: 8 days A FACE TO FACE EVALUATION WAS PERFORMED  Ankit Lorie Phenix 03/21/2019, 9:46 AM

## 2019-03-21 NOTE — Progress Notes (Addendum)
Occupational Therapy Weekly Progress Note  Patient Details  Name: [name] MRN: [id number] Date of Birth: Nov 14, 1951  Beginning of progress report period: March 14, 2019 End of progress report period: March 21, 2019  Today's Date: 03/21/2019 OT Individual Time: 3810-1751 OT Individual Time Calculation (min): 60 min    Patient has met 4 of 4 short term goals.  Pt is making steady progress towards goals, however is limited secondary to anxiety and difficulty with coping with feelings of limb loss.  Pt is currently able to complete basic transfers with slide board mod-min assist, mod assist slide board on/off drop arm BSC, and mod assist sit > stand in preparation for LB dressing.  Due to pt anxiety and self-limiting behaviors, continue to utilize [place] for LB dressing at sit > stand level.  Pt would benefit from additional neuro psych visit for anxiety and coping to allow for further participation and progress with therapy.  Patient continues to demonstrate the following deficits: muscle weakness, decreased awareness, decreased problem solving and decreased safety awareness and decreased sitting balance and decreased standing balance and therefore will continue to benefit from skilled OT intervention to enhance overall performance with BADL and Reduce care partner burden.  Patient not progressing toward long term goals.  See goal revision..  Plan of care revisions: downgraded standing balance, LB dressing, and toilet transfers and toileting to Min assist.  OT Short Term Goals Week 1:  OT Short Term Goal 1 (Week 1): Pt will complete LB dressing max assist at sit > stand level OT Short Term Goal 1 - Progress (Week 1): Met OT Short Term Goal 2 (Week 1): Pt will complete bathing with mod assist at sit > stand level OT Short Term Goal 2 - Progress (Week 1): Met OT Short Term Goal 3 (Week 1): Pt will complete toilet transfer mod assist  OT Short Term Goal 3 - Progress (Week 1): Met OT  Short Term Goal 4 (Week 1): Pt will complete sit > stand with mod assist to decrease burden of care with LB dressing OT Short Term Goal 4 - Progress (Week 1): Met Week 2:  OT Short Term Goal 1 (Week 2): Pt will complete toilet transfers min assist with slide board OT Short Term Goal 2 (Week 2): Pt will complete LB dressing min assist at sit > stand level OT Short Term Goal 3 (Week 2): Pt will complete hygiene with toileting with min assist and min cues for sequencing  Skilled Therapeutic Interventions/Progress Updates:    Treatment session with focus on ADL retraining with functional transfers, lateral leans,and sit > stand as needed to engage in bathing and dressing tasks.  Pt received upright in bed reporting need to toilet.  Completed bed mobility with CGA.  Therapist placed slide board under Lt hip while providing education on correct placement.  Pt able to complete slide board transfer to wide drop arm BSC with mod assist and large step under Lt foot to provide stability during transfer.  Engaged in lateral leans on [facility] to allow therapist to remove hospital brief.  Pt with continent bowel and bladder while on commode.  Engaged in lateral leans to allow therapist to complete hygiene.  Utilized [place] from toilet due to pt reports of fatigue post toileting.  Pt completed sit > stand with min assist in to [place] and transferred to w/c.  Engaged in [place] at sit > stand level from w/c with therapist providing max cues for hand placement and encouragement  due to pt fearful with sit > stand.  Completed sit > stand with mod assist and pt able to maintain standing with UE support on counter while therapist completed clothing management.  Pt completed grooming tasks in sitting at sink with min cues for set up of items.  Pt remained upright in w/c with seat belt alarm on and all needs in reach.  Therapy Documentation Precautions:  Precautions Precautions: Fall Precaution Comments: watch  BP Restrictions Weight Bearing Restrictions: Yes RLE Weight Bearing: Non weight bearing Other Position/Activity Restrictions: Pt R NWB with new BKA General:   Vital Signs: Therapy Vitals Temp: 98 F (36.7 C) Temp Source: Oral Pulse Rate: 72 Resp: 12 BP: (!) 92/55(rn notified ) Patient Position (if appropriate): Sitting Oxygen Therapy SpO2: 100 % O2 Device: Room Air Pain:  Pt with no c/o pain   Therapy/Group: Individual Therapy  HOXIE, SARAH 03/21/2019, 7:21 AM  

## 2019-03-22 ENCOUNTER — Inpatient Hospital Stay (HOSPITAL_COMMUNITY): Payer: Medicare Other | Admitting: Occupational Therapy

## 2019-03-22 ENCOUNTER — Inpatient Hospital Stay (HOSPITAL_COMMUNITY): Payer: Medicare Other | Admitting: Physical Therapy

## 2019-03-22 DIAGNOSIS — I959 Hypotension, unspecified: Secondary | ICD-10-CM

## 2019-03-22 LAB — GLUCOSE, CAPILLARY
Glucose-Capillary: 108 mg/dL — ABNORMAL HIGH (ref 70–99)
Glucose-Capillary: 151 mg/dL — ABNORMAL HIGH (ref 70–99)
Glucose-Capillary: 157 mg/dL — ABNORMAL HIGH (ref 70–99)
Glucose-Capillary: 205 mg/dL — ABNORMAL HIGH (ref 70–99)

## 2019-03-22 LAB — BASIC METABOLIC PANEL
Anion gap: 10 (ref 5–15)
BUN: 22 mg/dL (ref 8–23)
CO2: 30 mmol/L (ref 22–32)
CREATININE: 1.38 mg/dL — AB (ref 0.44–1.00)
Calcium: 8.9 mg/dL (ref 8.9–10.3)
Chloride: 93 mmol/L — ABNORMAL LOW (ref 98–111)
GFR calc Af Amer: 45 mL/min — ABNORMAL LOW (ref >60)
GFR calc non Af Amer: 39 mL/min — ABNORMAL LOW (ref >60)
Glucose, Bld: 114 mg/dL — ABNORMAL HIGH (ref 70–99)
Potassium: 4.2 mmol/L (ref 3.5–5.1)
Sodium: 133 mmol/L — ABNORMAL LOW (ref 135–145)

## 2019-03-22 NOTE — Consult Note (Signed)
Neuropsychological Consultation   Patient:   [name]   DOB:   07/13/1951  MR Number:  [id number]  Location:  [facility] [address] CENTER [address] [place] [id number] [place] [address] Dept: [place]: [phone]           Date of Service:   03/15/2019  Start Time:   2 PM End Time:   3 PM  Provider/Observer:  Ilean Skill, Psy.D.       Clinical Neuropsychologist       Billing Code/Service: 96158/96159  Chief Complaint:    Manreet Kiernan is a 68 year old female with history of diabetes, diastolic congestive heart failure, CAD with stenting, panic attacks.  The patient presented on 02/15/2019 sustaining a right ankle fracture.  Patient had another fall and did not seek medical help.  Noted bleeding through the spling after that fall.  When leg was later evaluated there was noted extremely foul odor and the tibia was exposed.  Limb was not felt to be salvageable Limb was not salvageable and underwent right BKA on 03/06/2019.  Septic shock followed by critical care and acute blood loss anemia.  Once stable patient referred referred for comprehensive inpatient rehabilitation service.    Reason for Service:  The patient was referred for neurological consultation due to coping and adjustment issues.  Below is the HPI for the current admission.  HPI: [name]  Is a 68 year old right-handed female with history of diabetes mellitus maintained on Lantus insulin 60 units twice a day, diastolic congestive heart failure maintained on Lasix 40 mg daily, CAD with stenting 2008 maintained on Plavix followed by cardiology services Dr. Chancy Milroy, panic attacks. Per chart review and patient, patient lives with daughter and assistance as needed. One level home with 2 steps to entry. Independent prior to admission. Presented 03/06/2019 after a recent fall 02/15/2019 sustaining a right ankle fracture followed by  orthopedic services. She was initially placed in a splint as an outpatient after undergoing reduction in the emergency room and referred to orthopedic services. She had another fall, did not seek medical help. Noted bleeding through the splint after most recent fall and again did not pursue medical attention. Upon evaluation noted extremely foul odor and the tibia was exposed. 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 Mardelle Matte. Postoperative hypotension required intubation maintained on pressors. Findings of elevated troponin with cardiology services follow-up. Echocardiogram with ejection fraction 25% severely reduced systolic function. No intervention required remained on aspirin and Plavix therapy as well as initially intravenous heparin. Patient did undergo gentle diuresis close monitoring of renal function  1.8. Follow critical care medicine for septic shock in the setting of right ankle infection. She was initially maintained on ProAmatine for orthostasis. Acute blood loss anemia 7.8-8.2.Subcutaneous heparin for DVT prophylaxis. Therapy evaluations completed with recommendations of physical medicine rehabilitation consult. Patient was admitted for a 69 compress rehabilitation program.  Current Status:  The patient continued to be well oriented today as she was during the first visit.  The patient did have a number of worries and concerns that she verbalized to be a in speaking with staff and treatment numbers this is a typical response for her and she will be quite fixated on a number of concerns.  Most of which are relatively appropriate but once she has an answer she tends to continue to ask.  The patient reports that her mood was good and  there were no significant issues of depression or anxiety.  We continue to work on Radiographer, therapeutic and strategies.  Behavioral Observation: [name]  presents as a 68 y.o.-year-old Right Caucasian Female who appeared her stated  age. her dress was Appropriate and she was Well Groomed and her manners were Appropriate to the situation.  her participation was indicative of Appropriate and Redirectable behaviors.  There were any physical disabilities noted.  she displayed an appropriate level of cooperation and motivation.     Interactions:    Active Appropriate and Redirectable  Attention:   abnormal and attention span appeared shorter than expected for age  Memory:   within normal limits; recent and remote memory intact  Visuo-spatial:  not examined  Speech (Volume):  normal  Speech:   normal; normal  Thought Process:  Coherent and Relevant  Though Content:  WNL; not suicidal and not homicidal  Orientation:   person, place, time/date and situation  Judgment:   Poor  Planning:   Fair  Affect:    Appropriate  Mood:    Euthymic  Insight:   Fair  Medical History:   Past Medical History:  Diagnosis Date  . Below-knee amputation of right lower extremity ([place]) 03/06/2019  . 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 CHF (congestive heart failure) ([place])   . Complication of anesthesia   . Coronary artery disease   . Diabetes mellitus without complication ([place])    type 2   . Fracture    right ankle  . Fracture of humerus, proximal, left, closed 02/23/2014  . Full dentures   . Myocardial infarct ([place])   . Neuropathic pain   . Panic attacks    Hx: of  . PONV (postoperative nausea and vomiting)   . Wears glasses      Psychiatric History:  Patient denies any prior history of psychiatric issues and denies any significant anxiety or depressive symptoms at this time.  However, she does have a significant history of panic attacks.  The patient denies any reoccurrence are reemergence of panic attacks recently.  Family Med/Psych History:  Family History  Problem Relation Age of Onset  . Diabetes Mother   . Hypertension Mother   . Stroke Mother   . Diabetes Brother   . Breast cancer Neg Hx     Impression/DX:  [name] is a 68 year old female with history of diabetes, diastolic congestive heart failure, CAD with stenting, panic attacks.  The patient presented on 02/15/2019 sustaining a right ankle fracture.  Patient had another fall and did not seek medical help.  Noted bleeding through the spling after that fall.  When leg was later evaluated there was noted extremely foul odor and the tibia was exposed.  Limb was not felt to be salvageable Limb was not salvageable and underwent right BKA on 03/06/2019.  Septic shock followed by critical care and acute blood loss anemia.  Once stable patient referred referred for comprehensive inpatient rehabilitation service.   The patient continued to be well oriented today as she was during the first visit.  The patient did have a number of worries and concerns that she verbalized to be a in speaking with staff and treatment numbers this is a typical response for her and she will be quite fixated on a number of concerns.  Most of which are relatively appropriate but once she has an answer she tends to continue to ask.  The patient reports that her mood was good  and there were no significant issues of depression or anxiety.  We continue to work on Radiographer, therapeutic and strategies.  Diagnosis:    BKA, Heart Disease/diabetes         Electronically Signed   _______________________ Ilean Skill, Psy.D.

## 2019-03-22 NOTE — Progress Notes (Signed)
[place] PHYSICAL MEDICINE & REHABILITATION PROGRESS NOTE  Subjective/Complaints: Patient seen sitting up in bed this morning.  She states she slept well overnight.  She would like me to position her so she can eat.  Discussed removal of dressing with nursing yesterday, discussed again today.  ROS: Denies CP, shortness of breath, nausea, vomiting, diarrhea.  Objective: Vital Signs: Blood pressure (!) 103/50, pulse 72, temperature 98.4 F (36.9 C), temperature source Oral, resp. rate 18, height 5\' 9"  (1.753 m), weight 88.6 kg, SpO2 98 %. No results found. Recent Labs    03/20/19 1012  WBC 6.0  HGB 8.6*  HCT 28.8*  PLT 329   Recent Labs    03/21/19 0555 03/22/19 0605  NA 134* 133*  K 3.7 4.2  CL 93* 93*  CO2 25 30  GLUCOSE 90 114*  BUN 24* 22  CREATININE 1.31* 1.38*  CALCIUM 9.1 8.9    Physical Exam: BP (!) 103/50 (BP Location: Left Arm)   Pulse 72   Temp 98.4 F (36.9 C) (Oral)   Resp 18   Ht 5\' 9"  (1.753 m)   Wt 88.6 kg   SpO2 98%   BMI 28.84 kg/m  Constitutional: No distress . Vital signs reviewed. HENT: Normocephalic.  Atraumatic. Eyes: EOMI. No discharge. Cardiovascular: RRR.  No JVD. Respiratory: CTA bilaterally.  Normal effort. GI: BS +. Non-distended. Musc: No edema or tenderness in extremities. Musculoskeletal: Right stump edema with tenderness  Neurological: She is alert and oriented.  She follows full commands. Motor: Bilateral upper extremities: 4-4+/5 proximal distal, stable Left lower extremity: 4/5 proximal distal, stable Right lower extremity: Hip flexion 4-/5 (pain inhibition), stable Sensation diminished to light touch left foot Skin: Right stump with dressing CD/I  Psychiatric: Her affect is blunt.   Assessment/Plan: 1. 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  Physiatrist is providing  close team supervision and 24 hour management of active medical problems listed below.  Physiatrist and rehab team continue to assess barriers to discharge/monitor patient progress toward functional and medical goals  Care Tool:  Bathing  Bathing activity did not occur: Refused Body parts bathed by patient: Right arm, Left arm, Chest, Abdomen, Face   Body parts bathed by helper: Front perineal area, Buttocks Body parts n/a: Right lower leg(Rt BKA)   Bathing assist Assist Level: Moderate Assistance - Patient 50 - 74%     Upper Body Dressing/Undressing Upper body dressing   What is the patient wearing?: Pull over shirt    Upper body assist Assist Level: Set up assist    Lower Body Dressing/Undressing Lower body dressing      What is the patient wearing?: Pants     Lower body assist Assist for lower body dressing: Moderate Assistance - Patient 50 - 74%     Toileting Toileting Toileting Activity did not occur Landscape architect and hygiene only): Safety/medical concerns(pt used bedpan)  Toileting assist Assist for toileting: Total Assistance - Patient < 25%     Transfers Chair/bed transfer  Transfers assist  Chair/bed transfer activity did not occur: Safety/medical concerns  Chair/bed transfer assist level: Moderate Assistance - Patient 50 - 74%     Locomotion Ambulation   Ambulation assist   Ambulation activity did not occur: Safety/medical concerns          Walk 10 feet activity   Assist  Walk 50 feet activity   Assist           Walk 150 feet activity   Assist           Walk 10 feet on uneven surface  activity   Assist Walk 10 feet on uneven surfaces activity did not occur: Safety/medical concerns         Wheelchair     Assist Will patient use wheelchair at discharge?: Yes Type of Wheelchair: Manual    Wheelchair assist level: Supervision/Verbal cueing Max wheelchair distance: 50 ft    Wheelchair 50  feet with 2 turns activity    Assist    Wheelchair 50 feet with 2 turns activity did not occur: Safety/medical concerns   Assist Level: Minimal Assistance - Patient > 75%   Wheelchair 150 feet activity     Assist Wheelchair 150 feet activity did not occur: Safety/medical concerns          Medical Problem List and Plan: 1.  Decreased functional mobility secondary to right BKA 10/93/2355 complicated by perioperative non-STEMI/septic shock             Continue CIR  Team conference today to discuss current and goals and coordination of care, home and environmental barriers, and discharge planning with nursing, case manager, and therapies.  2.  Antithrombotics: -DVT/anticoagulation:  Subcutaneous heparin             -antiplatelet therapy:  Aspirin 81 mg daily, Plavix 75 mg daily 3. Pain Management:  Neurontin 600 mg 3 times a day  Tramadol changed to 100 mg 3 times a day prn on 3/20, decreased to 50 on 3/24  Oxycodone as needed  Continue to provide encouragement and coping mechanisms, appreciate neuropsych 4. Mood: Klonopin 0.5 mg twice a day as needed             -antipsychotic agents: see above 5. Neuropsych: This patient is capable of making decisions on his own behalf. 6. Skin/Wound Care:  Routine skin checks  Will follow-up regarding dressing changes,?  Surgery evaluation 7. Fluids/Electrolytes/Nutrition: routine and out's  8. Acute blood loss anemia.   Hemoglobin 8.6 on 3/24  Continue to monitor 9. 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 Monitor for any signs of fluid overload. Lanoxin 0.125 mg daily, Aldactone 12.5 mg daily, Demadex 40 mg daily. Patient is off ProAmatine.   Filed Weights   03/20/19 0500 03/21/19 0431 03/22/19 0545  Weight: 91.4 kg 89.6 kg 88.6 kg   Stable on 3/24 10. Diabetes mellitus with peripheral neuropathy. Hemoglobin A1c 7.8.   Lantus insulin 60 units twice a day, decreased to 30 BID on 3/17, increased to 45 twice  daily on 3/18, increased to 50 on 3/20  Cont NovoLog 7 units 3 times a day.    Slightly labile on 3/25  Monitor with increased mobility 11. Hyperlipidemia. Lipitor 12.  Supplemental oxygen dependent             Weaned supplemental oxygen  Encouraged IS 13. CAD  S/p NSTEMI  Digoxin level within normal limits on 3/24  Appreciate HF recs 14. Hyponatremia  Na 133 on 3/25  Cont to monitor 15. Hypokalemia: Resolved after supplementation 16. AKI  Cr 1.38 on 3/25  Encourage fluids  Cont to monitor 17. Leukocytosis: Resolved  Cont to monitor 18.  Borderline hypotension  Asymptomatic at present  Recs per heart failure team  Continue monitor with increased mobility  Stable on 3/25  LOS: 9 days A FACE  TO FACE EVALUATION WAS PERFORMED  Tory Mckissack Lorie Phenix 03/22/2019, 9:56 AM

## 2019-03-22 NOTE — Progress Notes (Signed)
Occupational Therapy Session Note  Patient Details  Name: [name] DOBLER MRN: [id number] Date of Birth: 01-21-1951  Today's Date: 03/22/2019 OT Individual [phone] and 1100-1130 OT Individual Time Calculation (min): 59 min and 30 min    Short Term Goals: Week 2:  OT Short Term Goal 1 (Week 2): Pt will complete toilet transfers min assist with slide board OT Short Term Goal 2 (Week 2): Pt will complete LB dressing min assist at sit > stand level OT Short Term Goal 3 (Week 2): Pt will complete hygiene with toileting with min assist and min cues for sequencing  Skilled Therapeutic Interventions/Progress Updates:    Session 1: Upon entering the room, pt supine in bed with no c/o pain and agreeable to OT intervention. Pt declined to toileting and bathing this session but was agreeable to changing clothing. OT discussed home set up as pt is resistive to therapist providing cuing for pt to participate more in task vs therapist performing task for pt. Supine >sit from flat bed with min A to EOB. Pt with supervision for sitting balance and donning pull over shirt with set up A to obtain needed items. Pt donning LB clothing items with focus on lateral leaning. Pt very self limiting and needing heavy encouragement to perform task but able to do with min A. Pt verbalized, " This is too hard. I'm just not going to get dressed at home." OT discussed other options such as skirt or dress that may be easier for her to manage but that still needed to cover buttocks if using slide board for skin integrity. Pt verbalized understanding. Pt performed uneven transfer "uphill" with min A for safety and OT steadying equipment. Pt propelled wheelchair 50' up slight incline similar to ramp but pt refusing to push chair further. Pt remaining in wheelchair at end of session with call bell and all needed items within reach.    Session 2: Upon entering the room, pt supine in bed and sleeping soundly. Multiple  attempts made to wake pt until she opened her eyes for participation. Pt agreeable to B UE strengthening but refusing to exit bed this session. OT educated and demonstrated use of level 2 resistive theraband exercises. Pt returning demonstrations with min multimodal cuing for proper technique and hand placement. Pt performed 2 sets of 10 chest pulls, shoulder elevation, shoulder diagonals, bicep curls, and alternating punches. Min cuing for pursed lip breathing with exercises as well. Pt remaining in bed at end of session with bed alarm activated and call bell within reach.   Therapy Documentation Precautions:  Precautions Precautions: Fall Precaution Comments: watch BP Restrictions Weight Bearing Restrictions: Yes RLE Weight Bearing: Non weight bearing Other Position/Activity Restrictions: Pt R NWB with new BKA   Pain: Pain Assessment Pain Scale: 0-10 Pain Score: 0-No pain   Therapy/Group: Individual Therapy  [name] 03/22/2019, 11:55 AM

## 2019-03-22 NOTE — Plan of Care (Signed)
  Problem: Consults Goal: RH LIMB LOSS PATIENT EDUCATION Description Description: See Patient Education module for eduction specifics. 03/22/2019 1348 by Glean Salen, RN Outcome: Progressing 03/22/2019 1347 by Glean Salen, RN Outcome: Progressing   Problem: RH BOWEL ELIMINATION Goal: RH STG MANAGE BOWEL WITH ASSISTANCE Description STG Manage Bowel with min Assistance.  03/22/2019 1348 by Glean Salen, RN Outcome: Progressing Flowsheets (Taken 03/22/2019 1348) STG: Pt will manage bowels with assistance: 3-Moderate assistance 03/22/2019 1347 by Glean Salen, RN Outcome: Progressing   Problem: RH BLADDER ELIMINATION Goal: RH STG MANAGE BLADDER WITH ASSISTANCE Description STG Manage Bladder With  Min Assistance  03/22/2019 1348 by Glean Salen, RN Outcome: Progressing Flowsheets (Taken 03/22/2019 1348) STG: Pt will manage bladder with assistance: 3-Moderate assistance 03/22/2019 1347 by Glean Salen, RN Outcome: Progressing   Problem: RH SKIN INTEGRITY Goal: RH STG SKIN FREE OF INFECTION/BREAKDOWN Description Min assist  03/22/2019 1348 by Glean Salen, RN Outcome: Progressing 03/22/2019 1347 by Glean Salen, RN Outcome: Progressing Goal: RH STG MAINTAIN SKIN INTEGRITY WITH ASSISTANCE Description STG Maintain Skin Integrity With min Assistance.  03/22/2019 1348 by Glean Salen, RN Outcome: Progressing Flowsheets (Taken 03/22/2019 1348) STG: Maintain skin integrity with assistance: 2-Maximum assistance 03/22/2019 1347 by Glean Salen, RN Outcome: Progressing Goal: RH STG ABLE TO PERFORM INCISION/WOUND CARE W/ASSISTANCE Description STG Able To Perform Incision/Wound Care With  [facility].  03/22/2019 1348 by Glean Salen, RN Outcome: Progressing Flowsheets (Taken 03/22/2019 1348) STG: Pt will be able to perform incision/wound care with assistance: 2-Maximum assistance 03/22/2019 1347 by Glean Salen, RN Outcome: Progressing   Problem: RH SAFETY Goal:  RH STG ADHERE TO SAFETY PRECAUTIONS W/ASSISTANCE/DEVICE Description STG Adhere to Safety Precautions With  Min Assistance/Device.  03/22/2019 1348 by Glean Salen, RN Outcome: Progressing Flowsheets (Taken 03/22/2019 1348) STG:Pt will adhere to safety precautions with assistance/device: 2-Maximum assistance 03/22/2019 1347 by Glean Salen, RN Outcome: Progressing   Problem: RH PAIN MANAGEMENT Goal: RH STG PAIN MANAGED AT OR BELOW PT'S PAIN GOAL Description Manage pain at or below level 4  03/22/2019 1348 by Glean Salen, RN Outcome: Progressing 03/22/2019 1347 by Glean Salen, RN Outcome: Progressing   Problem: RH KNOWLEDGE DEFICIT LIMB LOSS Goal: RH STG INCREASE KNOWLEDGE OF SELF CARE AFTER LIMB LOSS Description  dtr to Manage care at discharge using handouts/resource notebook independently  Outcome: Progressing

## 2019-03-22 NOTE — Progress Notes (Signed)
Occupational Therapy Session Note  Patient Details  Name: [name] MRN: [id number] Date of Birth: 03/13/1951  Today's Date: 03/22/2019 OT Individual Time: 6010-9323 OT Individual Time Calculation (min): 54 min    Short Term Goals: Week 2:  OT Short Term Goal 1 (Week 2): Pt will complete toilet transfers min assist with slide board OT Short Term Goal 2 (Week 2): Pt will complete LB dressing min assist at sit > stand level OT Short Term Goal 3 (Week 2): Pt will complete hygiene with toileting with min assist and min cues for sequencing  Skilled Therapeutic Interventions/Progress Updates:    Treatment session with focus on transfer training and care for residual limb.  Pt received upright in w/c reporting pain in buttocks, expressing desire to "do anything" to get out of w/c.  Completed slide board transfers during session with setup assist for board placement and CGA when transferring to Lt and min assist when transferring to Rt.  Engaged in there ex in supine and sidelying with focus on ROM at Rt hip to decrease pain and maintain mobility to allow for future prosthesis.  Pt completed 2 sets of 10 of each with mod encouragement.  Pt "snippy" during session, but completing exercises as instructed.  Pt stating "I'm done" and then transferred back to bed with slide board min assist.  Pt returned to semi-reclined and left with all needs in reach.  Therapy Documentation Precautions:  Precautions Precautions: Fall Precaution Comments: watch BP Restrictions Weight Bearing Restrictions: Yes RLE Weight Bearing: Non weight bearing Other Position/Activity Restrictions: Pt R NWB with new BKA Pain:   Pt with c/o pain in buttocks, repositioned   Therapy/Group: Individual Therapy  Simonne Come 03/22/2019, 10:27 AM

## 2019-03-22 NOTE — Progress Notes (Signed)
Occupational Therapy Session Note  Patient Details  Name: [name] MRN: [id number] Date of Birth: 06-08-1951  Today's Date: 03/22/2019 OT Individual Time: 1400-1440 OT Individual Time Calculation (min): 40 min  and Today's Date: 03/22/2019 OT Missed Time: 20 Minutes Missed Time Reason: Patient fatigue;Patient unwilling/refused to participate without medical reason   Short Term Goals: Week 2:  OT Short Term Goal 1 (Week 2): Pt will complete toilet transfers min assist with slide board OT Short Term Goal 2 (Week 2): Pt will complete LB dressing min assist at sit > stand level OT Short Term Goal 3 (Week 2): Pt will complete hygiene with toileting with min assist and min cues for sequencing  Skilled Therapeutic Interventions/Progress Updates:    Treatment session with focus on sit > stand to decrease burden of care with LB dressing and toileting.  Pt received upright in bed reporting fatigue but willing to engage in therapy in room.  Pt completed bed mobility with CGA with use of bed rails.  Max encouragement to engage in sit > stand, as pt stated "I've already done that today".  Engaged in discussion regarding therapy goals and focus on improving functional mobility to prepare for d/c next week.  Pt engaged in sit > stand x5 with mod assist and max cues for hand placement and sequencing.  Pt required rest breaks between each stand.  Did tolerate standing 6-10 seconds at a time with RW before returning to sitting.  Pt stated "I'm done" and returned to supine in bed.  Pt able to scoot up in bed by bridging and pushing with LLE while pulling up on bed rails with BUE.  Pt remained semi-reclined in bed with all needs in reach.  Therapy Documentation Precautions:  Precautions Precautions: Fall Precaution Comments: watch BP Restrictions Weight Bearing Restrictions: Yes RLE Weight Bearing: Non weight bearing Other Position/Activity Restrictions: Pt R NWB with new  BKA General: General OT Amount of Missed Time: 20 Minutes Vital Signs: Therapy Vitals Temp: 98.9 F (37.2 C) Temp Source: Oral Pulse Rate: 84 Resp: 18 BP: (!) 116/53 Patient Position (if appropriate): Lying Oxygen Therapy SpO2: 94 % O2 Device: Room Air Pain: Pain Assessment Pain Scale: 0-10 Pain Score: 4  Faces Pain Scale: Hurts a little bit Pain Type: Surgical pain Pain Location: Leg Pain Orientation: Right Pain Descriptors / Indicators: Aching Pain Frequency: Occasional Pain Onset: Gradual Pain Intervention(s): Medication (See eMAR);Repositioned   Therapy/Group: Individual Therapy  Simonne Come 03/22/2019, 3:05 PM

## 2019-03-22 NOTE — Progress Notes (Signed)
Physical Therapy Session Note  Patient Details  Name: [name] MRN: [id number] Date of Birth: April 17, 1951  Today's Date: 03/22/2019 PT Individual Time: 1230-1300 PT Individual Time Calculation (min): 30 min   Short Term Goals: Week 2:  PT Short Term Goal 1 (Week 2): = LTG  Skilled Therapeutic Interventions/Progress Updates: Pt presented in bed agreeable to therapy with encouragement. Pt requesting to need to use BSC. Pt performed bed mobility with supervision, increased time and use of bed features. Pt encouraged to perform stand pivot transfer which pt was reluctantly agreeable. Pt performed STS from elevated bed with minA and performed stand pivot with modA for stabilizing RW and sequencing. Pt was unable to clear foot and performed shuffling action to BSC (+BM). Pt was able to perform STS from [facility] with minA and maintaining standing with minA approx 1 min while NT performed peri-care. Pt requesting SB transfer back to bed. Performed SB transfer to R with CGA. Pt returned to supine with supervision and left with call bell within reach and [facility], nurse present to provide meds.      Therapy Documentation Precautions:  Precautions Precautions: Fall Precaution Comments: watch BP Restrictions Weight Bearing Restrictions: Yes RLE Weight Bearing: Non weight bearing Other Position/Activity Restrictions: Pt R NWB with new BKA General:   Vital Signs:  Pain: Pain Assessment Pain Scale: 0-10 Pain Score: 4  Faces Pain Scale: Hurts a little bit Pain Type: Surgical pain Pain Location: Leg Pain Orientation: Right Pain Descriptors / Indicators: Aching Pain Frequency: Occasional Pain Onset: Gradual Pain Intervention(s): Medication (See eMAR);Repositioned   Therapy/Group: Individual Therapy  Arris Meyn  Jakhai Fant, PTA  03/22/2019, 1:12 PM

## 2019-03-23 ENCOUNTER — Inpatient Hospital Stay (HOSPITAL_COMMUNITY): Payer: Medicare Other | Admitting: Physical Therapy

## 2019-03-23 ENCOUNTER — Inpatient Hospital Stay (HOSPITAL_COMMUNITY): Payer: Medicare Other

## 2019-03-23 ENCOUNTER — Inpatient Hospital Stay (HOSPITAL_COMMUNITY): Payer: Medicare Other | Admitting: Occupational Therapy

## 2019-03-23 DIAGNOSIS — K3184 Gastroparesis: Secondary | ICD-10-CM

## 2019-03-23 DIAGNOSIS — E1143 Type 2 diabetes mellitus with diabetic autonomic (poly)neuropathy: Secondary | ICD-10-CM

## 2019-03-23 LAB — BASIC METABOLIC PANEL
Anion gap: 12 (ref 5–15)
BUN: 21 mg/dL (ref 8–23)
CO2: 26 mmol/L (ref 22–32)
Calcium: 9 mg/dL (ref 8.9–10.3)
Chloride: 93 mmol/L — ABNORMAL LOW (ref 98–111)
Creatinine, Ser: 1.47 mg/dL — ABNORMAL HIGH (ref 0.44–1.00)
GFR calc Af Amer: 42 mL/min — ABNORMAL LOW (ref >60)
GFR calc non Af Amer: 36 mL/min — ABNORMAL LOW (ref >60)
Glucose, Bld: 208 mg/dL — ABNORMAL HIGH (ref 70–99)
Potassium: 4 mmol/L (ref 3.5–5.1)
Sodium: 131 mmol/L — ABNORMAL LOW (ref 135–145)

## 2019-03-23 LAB — GLUCOSE, CAPILLARY
GLUCOSE-CAPILLARY: 345 mg/dL — AB (ref 70–99)
Glucose-Capillary: 163 mg/dL — ABNORMAL HIGH (ref 70–99)
Glucose-Capillary: 228 mg/dL — ABNORMAL HIGH (ref 70–99)
Glucose-Capillary: 261 mg/dL — ABNORMAL HIGH (ref 70–99)

## 2019-03-23 MED ORDER — GABAPENTIN 300 MG PO CAPS
300.0000 mg | ORAL_CAPSULE | Freq: Three times a day (TID) | ORAL | Status: DC
  Administered 2019-03-23 – 2019-03-30 (×20): 300 mg via ORAL
  Filled 2019-03-23 (×21): qty 1

## 2019-03-23 MED ORDER — METOCLOPRAMIDE HCL 5 MG PO TABS
5.0000 mg | ORAL_TABLET | Freq: Every day | ORAL | Status: DC
  Administered 2019-03-23 – 2019-03-29 (×7): 5 mg via ORAL
  Filled 2019-03-23 (×7): qty 1

## 2019-03-23 MED ORDER — METOPROLOL SUCCINATE ER 25 MG PO TB24
12.5000 mg | ORAL_TABLET | Freq: Every day | ORAL | Status: DC
  Administered 2019-03-23 – 2019-03-29 (×7): 12.5 mg via ORAL
  Filled 2019-03-23 (×7): qty 1

## 2019-03-23 MED ORDER — GABAPENTIN 600 MG PO TABS
300.0000 mg | ORAL_TABLET | Freq: Three times a day (TID) | ORAL | Status: DC
  Administered 2019-03-23: 300 mg via ORAL
  Filled 2019-03-23: qty 1

## 2019-03-23 NOTE — Progress Notes (Addendum)
Physical Therapy Session Note  Patient Details  Name: [name] MRN: [id number] Date of Birth: 1951-02-18  Today's Date: 03/23/2019 PT Individual Time: 0830-0923 PT Individual Time Calculation (min): 53 min   Short Term Goals: Week 1:  PT Short Term Goal 1 (Week 1): pt will consistently perform transfers with min A PT Short Term Goal 1 - Progress (Week 1): Progressing toward goal PT Short Term Goal 2 (Week 1): pt will propel w/c with supervision x 50' in controlled environment PT Short Term Goal 2 - Progress (Week 1): Met  Skilled Therapeutic Interventions/Progress Updates:    pt in bed with residual limb unwrapped.  PT rewraps residual limb to improve shaping.  Pt performs bed mobility with supervision, increased time.  Pt performs sliding board transfers with min A uphill, supervision to level surfaces with set up assist.  Pt propels w/c 50' x 2 with supervision, max encouragement.  Sit <> stand x 5 with RW and min A.  Standing horseshoe toss with min/mod A for balance with 1 UE support.  Pt left in bed with all needs at hand, alarm set.  Session 2: Pt refused 30 minute session due to "I'm trying to sleep" and "I already did therapy with you today".  PT encouraged pt to participate in bed level therex, pt continued to refuse.  Therapy Documentation Precautions:  Precautions Precautions: Fall Precaution Comments: watch BP Restrictions Weight Bearing Restrictions: Yes RLE Weight Bearing: Non weight bearing Other Position/Activity Restrictions: Pt R NWB with new BKA Pain:  no c/o pain. Pt rec'd pain meds prior to therapy   Therapy/Group: Individual Therapy  DONAWERTH,KAREN 03/23/2019, 9:26 AM

## 2019-03-23 NOTE — Progress Notes (Signed)
[facility] PHYSICAL MEDICINE & REHABILITATION PROGRESS NOTE  Subjective/Complaints: Patient seen sitting up in bed this morning.  She states she did not sleep well overnight due to Covid-19.  When asked, she states that she feels anxious and bad for those people affected.  She also states that she wants to go home and does not feel that people like her here.  ROS: Denies CP, shortness of breath, nausea, vomiting, diarrhea.  Objective: Vital Signs: Blood pressure (!) 98/42, pulse 72, temperature 98.5 F (36.9 C), temperature source Oral, resp. rate 12, height 5\' 9"  (1.753 m), weight 87.1 kg, SpO2 100 %. No results found. No results for input(s): WBC, HGB, HCT, PLT in the last 72 hours. Recent Labs    03/22/19 0605 03/23/19 0436  NA 133* 131*  K 4.2 4.0  CL 93* 93*  CO2 30 26  GLUCOSE 114* 208*  BUN 22 21  CREATININE 1.38* 1.47*  CALCIUM 8.9 9.0    Physical Exam: BP (!) 98/42 (BP Location: Left Arm)   Pulse 72   Temp 98.5 F (36.9 C) (Oral)   Resp 12   Ht 5\' 9"  (1.753 m)   Wt 87.1 kg   SpO2 100%   BMI 28.36 kg/m  Constitutional: No distress . Vital signs reviewed. HENT: Normocephalic.  Atraumatic. Eyes: EOMI. No discharge. Cardiovascular: RRR.  No JVD. Respiratory: CTA bilaterally.  Normal effort. GI: BS +. Non-distended. Musc: No edema or tenderness in extremities. Musculoskeletal: Right stump edema with tenderness, improving Neurological: She is alert and oriented.  She follows full commands. Motor: Bilateral upper extremities: 4-4+/5 proximal distal, unchanged Left lower extremity: 4/5 proximal distal, unchanged Right lower extremity: Hip flexion 4-/5 (pain inhibition), stable Sensation diminished to light touch left foot Skin: Right stump with sutures C/D/I Stage II on patella at location of cast.  Psychiatric: Her affect is blunt.   Assessment/Plan: 1. 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  Physiatrist is providing close team supervision and 24 hour management of active medical problems listed below.  Physiatrist and rehab team continue to assess barriers to discharge/monitor patient progress toward functional and medical goals  Care Tool:  Bathing  Bathing activity did not occur: Refused Body parts bathed by patient: Right arm, Left arm, Chest, Abdomen, Face   Body parts bathed by helper: Front perineal area, Buttocks Body parts n/a: Right lower leg(Rt BKA)   Bathing assist Assist Level: Moderate Assistance - Patient 50 - 74%     Upper Body Dressing/Undressing Upper body dressing   What is the patient wearing?: Pull over shirt    Upper body assist Assist Level: Set up assist    Lower Body Dressing/Undressing Lower body dressing      What is the patient wearing?: Pants     Lower body assist Assist for lower body dressing: Moderate Assistance - Patient 50 - 74%     Toileting Toileting Toileting Activity did not occur Landscape architect and hygiene only): Safety/medical concerns(pt used bedpan)  Toileting assist Assist for toileting: Total Assistance - Patient < 25%     Transfers Chair/bed transfer  Transfers assist  Chair/bed transfer activity did not occur: Safety/medical concerns  Chair/bed transfer assist level: Moderate Assistance - Patient 50 - 74%     Locomotion Ambulation   Ambulation assist   Ambulation activity did not occur: Safety/medical concerns  Walk 10 feet activity   Assist           Walk 50 feet activity   Assist           Walk 150 feet activity   Assist           Walk 10 feet on uneven surface  activity   Assist Walk 10 feet on uneven surfaces activity did not occur: Safety/medical concerns         Wheelchair     Assist Will patient use wheelchair at discharge?: Yes Type of Wheelchair:  Manual    Wheelchair assist level: Supervision/Verbal cueing Max wheelchair distance: 50 ft    Wheelchair 50 feet with 2 turns activity    Assist    Wheelchair 50 feet with 2 turns activity did not occur: Safety/medical concerns   Assist Level: Minimal Assistance - Patient > 75%   Wheelchair 150 feet activity     Assist Wheelchair 150 feet activity did not occur: Safety/medical concerns          Medical Problem List and Plan: 1.  Decreased functional mobility secondary to right BKA 45/40/9811 complicated by perioperative non-STEMI/septic shock             Continue CIR  Will order stump shrinker 2.  Antithrombotics: -DVT/anticoagulation:  Subcutaneous heparin             -antiplatelet therapy:  Aspirin 81 mg daily, Plavix 75 mg daily 3. Pain Management:  Neurontin 600 mg 3 times a day, decreased to 300 3 times daily on 3/26  Tramadol changed to 100 mg 3 times a day prn on 3/20, decreased to 50 on 3/24  Oxycodone as needed  Continue to provide encouragement and coping mechanisms, appreciate neuropsych 4. Mood: Klonopin 0.5 mg twice a day as needed             -antipsychotic agents: see above 5. Neuropsych: This patient is capable of making decisions on his own behalf. 6. Skin/Wound Care:  Routine skin checks  Wound care to stump 7. Fluids/Electrolytes/Nutrition: routine and out's  8. Acute blood loss anemia.   Hemoglobin 8.6 on 3/24  Continue to monitor 9. 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 Monitor for any signs of fluid overload. Lanoxin 0.125 mg daily, Aldactone 12.5 mg daily, Demadex 40 mg daily. Patient is off ProAmatine.   Filed Weights   03/21/19 0431 03/22/19 0545 03/23/19 0653  Weight: 89.6 kg 88.6 kg 87.1 kg   Stable on 3/26 10. 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 Hemoglobin A1c 7.8.   Lantus insulin 60 units twice a day, decreased to 30 BID on 3/17, increased to 45 twice daily on 3/18,  increased to 50 on 3/20  Cont NovoLog 7 units 3 times a day.    Reglan decreased to 5 mg nightly on 3/26  Slightly labile on 3/26  Monitor with increased mobility 11. Hyperlipidemia. Lipitor 12.  Supplemental oxygen dependent             Weaned supplemental oxygen  Encouraged IS 13. CAD  S/p NSTEMI  Digoxin level within normal limits on 3/24  Appreciate HF recs 14. Hyponatremia  Na 131 on 3/26  Cont to monitor 15. Hypokalemia: Resolved after supplementation 16. AKI  Cr 1.47 on 3/26  Encourage fluids  Cont to monitor 17. Leukocytosis: Resolved  Cont to monitor 18.  Borderline hypotension  Asymptomatic on 3/26  Recs per heart failure team  Continue monitor with increased mobility  LOS: 10 days A FACE TO FACE EVALUATION WAS PERFORMED  Lennell Shanks Lorie Phenix 03/23/2019, 10:47 AM

## 2019-03-23 NOTE — Patient Care Conference (Signed)
Inpatient RehabilitationTeam Conference and Plan of Care Update Date: 03/22/2019   Time: 2:20 PM    Patient Name: [name]      Medical Record Number: [id number]  Date of Birth: 1951/12/17 Sex: Female         Room/Bed: 4M13C/4M13C-01 Payor Info: Payor: MEDICARE / Plan: MEDICARE PART A AND B / Product Type: *No Product type* /    Admitting Diagnosis: r bka  Admit Date/Time:  03/13/2019  5:11 PM Admission Comments: No comment available   Primary Diagnosis:  <principal problem not specified> Principal Problem: <principal problem not specified>  Patient Active Problem List   Diagnosis Date Noted  . Arterial hypotension   . Hyponatremia   . Leucocytosis   . AKI (acute kidney injury) ([place])   . Diabetic osteomyelitis ([place])   . Hypokalemia   . Diabetic peripheral neuropathy ([place])   . Labile blood glucose   . Hypoglycemia   . Acute on chronic systolic heart failure ([place])   . Right below-knee amputee ([place]) 03/13/2019  . Supplemental oxygen dependent   . Postoperative pain   . Acute blood loss anemia   . Acute on chronic systolic (congestive) heart failure ([place])   . Orthostasis   . Diabetes mellitus type 2 in obese ([place])   . Dyslipidemia   . Non-STEMI (non-ST elevated myocardial infarction) ([place])   . Encounter for central line placement   . Below-knee amputation of right lower extremity ([place]) 03/06/2019  . S/P below knee amputation, right ([place]) 03/06/2019  . Hypoxemia   . Shock circulatory ([place])   . Acute pain   . Acute encephalopathy   . Gastroparesis due to DM ([place]) 04/21/2016  . DM type 2 with diabetic peripheral neuropathy ([place]) 03/12/2016  . 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 Peripheral polyneuropathy 11/14/2014  . Fracture of humerus, proximal, left, closed 02/23/2014  . Proximal humerus fracture 02/23/2014    Expected Discharge Date: Expected Discharge Date: 03/30/19  Team Members Present: Physician leading conference: Dr. Delice Lesch Social Worker  Present: Ovidio Kin, LCSW Nurse Present: Blair Heys, RN PT Present: Leavy Cella, PT OT Present: Roanna Epley, [place], OT SLP Present: Weston Anna, SLP PPS Coordinator present : Gunnar Fusi     Current Status/Progress Goal Weekly Team Focus  Medical   Decreased functional mobility secondary to right BKA 10/13/5101 complicated by perioperative non-STEMI/septic shock  Improve mobility, transfers, endurance, DM, Na+, AKI, wound  See above   Bowel/Bladder   Pt refuses miralax  fewer episodes of incontinence.  assess bowel and bladder needs q shift and PRN   Swallow/Nutrition/ Hydration             ADL's   mod assist trasnfers with slide board, max assist LB bathing/dressing, requires max encouragement to participate.  Impaired memory ?new  Supervision, downgraded toilet transer and toileting to min assist  ADL retraining, activity tolerance, transfers, sit > stand, education on care for residual limb   Mobility   mod A sliding board transfers, supervision w/c mobility, max A sit <> stand  downgraded to min A  participation in therapy, transfers, activity tolerance   Communication             Safety/Cognition/ Behavioral Observations            Pain   C/o pain to right bka oxycodone 5mg  q4, ultram 50mg  q6  Pain less than or equal to 2.  assess pain qshift and PRN   Skin   RBKA compression  dressing inplace.   promote proper wound healing  assess skin qshift and PRN      *See Care Plan and progress notes for long and short-term goals.     Barriers to Discharge  Current Status/Progress Possible Resolutions Date Resolved   Physician    Medical stability;Wound Care     See above  Therapies, optimize DM meds, encourage fluids, follow labs, encourage fluids      Nursing                  PT                    OT                  SLP                SW                Discharge Planning/Teaching Needs:  HOme with daughter who can provide 24 hr physical care to  pt. Pt needs encouragement to participate in therapies-very deconditioned.      Team Discussion:  Progressing slowly in therapies, memory issues and self limiting. MD has decreased pain meds and doing well along with diabetic meds changes. Neuro-psych saw for coping. Goals downgraded to min assist level due to slow progress. Will need family education prior to DC home.  Revisions to Treatment Plan:  DC 4/2    Continued Need for Acute Rehabilitation Level of Care: The patient requires daily medical management by a physician with specialized training in physical medicine and rehabilitation for the following conditions: Daily direction of a multidisciplinary physical rehabilitation program to ensure safe treatment while eliciting the highest outcome that is of practical value to the patient.: Yes Daily medical management of patient stability for increased activity during participation in an intensive rehabilitation regime.: Yes Daily analysis of laboratory values and/or radiology reports with any subsequent need for medication adjustment of medical intervention for : Blood pressure problems;Renal problems;Post surgical problems;Diabetes problems;Wound care problems   I attest that I was present, lead the team conference, and concur with the assessment and plan of the team.   Elease Hashimoto 03/23/2019, 8:38 AM

## 2019-03-23 NOTE — Progress Notes (Signed)
Social Work Patient ID: [name], female   DOB: January 29, 1951, 68 y.o.   MRN: [id number] Spoke with daughter via telephone and pt to discuss team conference goals-min assist wheelchair level and discharge 4/2. Discussed the need for family education prior to 4/2. Daughter to discuss with pt and get back with this worker. She has provided care for her Mom before while waiting surgery. Will order equipment and home health for pt. Await daughter's call.

## 2019-03-23 NOTE — Progress Notes (Signed)
Occupational Therapy Session Note  Patient Details  Name: [name] MRN: [id number] Date of Birth: September 30, 1951  Today's Date: 03/23/2019 OT Individual Time: 0093-8182 OT Individual Time Calculation (min): 42 min    Short Term Goals: Week 2:  OT Short Term Goal 1 (Week 2): Pt will complete toilet transfers min assist with slide board OT Short Term Goal 2 (Week 2): Pt will complete LB dressing min assist at sit > stand level OT Short Term Goal 3 (Week 2): Pt will complete hygiene with toileting with min assist and min cues for sequencing  Skilled Therapeutic Interventions/Progress Updates:    Upon entering the room, pt sleeping soundly in bed and required maximal encouragement for OT intervention this session. Pt verbalizing need to change brief and OT suggested pt transfer onto toilet which she initially refused. Supine >sit with min A to EOB and mod A for stand pivot transfer with RW to drop arm commode chair. Pt was able to void and have BM. Pt able to perform partial hygiene and needing assistance for thoroughness. Pt donning UB clothing with set up A while seated on drop arm and donning pull over pants with mod A. Pt standing with mod A for standing balance to clothing management. Slide board with min A from drop arm> bed. Pt remaining seated on EOB with breakfast tray placed in front of her and staff remaining present in room. Call bell and all needed items within reach upon exiting the room.   Therapy Documentation Precautions:  Precautions Precautions: Fall Precaution Comments: watch BP Restrictions Weight Bearing Restrictions: Yes RLE Weight Bearing: Non weight bearing Other Position/Activity Restrictions: Pt R NWB with new BKA General: General PT Missed Treatment Reason: Patient fatigue;Patient unwilling to participate    Therapy/Group: Individual Therapy  [name] 03/23/2019, 11:27 AM

## 2019-03-23 NOTE — Progress Notes (Signed)
Orthopedic Tech Progress Note Patient Details:  [name] August 01, 1951 [id number] Called in order to [facility] Patient ID: [name], female   DOB: 02-15-1951, 68 y.o.   MRN: [id number]   [name] 03/23/2019, 11:07 AM

## 2019-03-23 NOTE — Progress Notes (Signed)
Occupational Therapy Session Note  Patient Details  Name: [name] MRN: [id number] Date of Birth: 02-04-1951  Today's Date: 03/23/2019 OT Individual Time: 7893-8101 OT Individual Time Calculation (min): 79 min     Skilled Therapeutic Interventions/Progress Updates:    Pt completed supine to sit EOB with supervision to begin session.  She was able to complete sliding board transfer from the bed to the wheelchair with min assist.  She then worked on pushing down to the therapy gym with supervision and increased time from the wheelchair.  She then transferred to the therapy mat with min assist via sliding board as well.  Had pt complete 2 sit to stand transitions with use of the walker with mod facilitation before pt responded "I don't want to do any more".  Therapist attempted to encourage pt that she needed to continue working on them so they would get easier than min assist, and to help with transfers and LB selfcare task.  Pt continued to respond that she had already done some earlier and didn't want to do anymore.  Transitioned to using yoga blocks next to work on forward weightshift for sit to stand and for increased triceps strengthening.  She was able to complete 1 set of 10 repetitions with increased time secondary to fatigue.  After that, therapist was able to have pt complete one sit to stand with min assist using the yoga blocks.  She then did not want to attempt again, declining to try.  Had pt finish gym work with stand and turn using the RW.  She stood again with the min assist and used the heel to toe method to turn to the chair.  Once she got a little closer, had her try and take a small hop, which she was able to do, but immediately responded "that's too hard".  Once lined up with the wheelchair, she was able to sit down but did not attempt to reach back for the chair as she practiced earlier with sit to stand from the mat. When cued to do so she just responded "I can't, I'm  not gonna do it."  At this time therapist placed wheelchair foot rests on the chair and discussed with pt that she needed to stop being so self limiting as she needed to do everything that she could to get better and take advantage of the therapy time she was given.  Pt responded with "I'm not self limiting".  When therapist pointed out the number of times she declined to participate in tasks her response was "Oh, I was just picking."  Finished session with education on tub/shower transfers with use of the sliding board with min assist and then transfer back to the bed in the room at the same level.  Pt was left with call button and phone in reach with orthotist in for education and issuance of amputee garments.      Therapy Documentation Precautions:  Precautions Precautions: Fall Precaution Comments: watch BP Restrictions Weight Bearing Restrictions: Yes RLE Weight Bearing: Non weight bearing Other Position/Activity Restrictions: Pt R NWB with new BKA  Pain: Pain Assessment Pain Scale: Faces(Back pain) Pain Score: 3  Faces Pain Scale: Hurts a little bit Pain Type: Acute pain Pain Location: Back Pain Orientation: Lower Pain Descriptors / Indicators: Discomfort Pain Frequency: Intermittent Pain Onset: With Activity Patients Stated Pain Goal: 4 Pain Intervention(s): Repositioned ADL:  See Care Tool Section for some details of ADL  Therapy/Group: Individual Therapy  [place] OTR/L 03/23/2019, 3:40  PM

## 2019-03-24 ENCOUNTER — Inpatient Hospital Stay (HOSPITAL_COMMUNITY): Payer: Medicare Other

## 2019-03-24 ENCOUNTER — Inpatient Hospital Stay (HOSPITAL_COMMUNITY): Payer: Medicare Other | Admitting: Physical Therapy

## 2019-03-24 ENCOUNTER — Inpatient Hospital Stay (HOSPITAL_COMMUNITY): Payer: Medicare Other | Admitting: Occupational Therapy

## 2019-03-24 DIAGNOSIS — F411 Generalized anxiety disorder: Secondary | ICD-10-CM

## 2019-03-24 LAB — BASIC METABOLIC PANEL
Anion gap: 13 (ref 5–15)
BUN: 19 mg/dL (ref 8–23)
CHLORIDE: 91 mmol/L — AB (ref 98–111)
CO2: 27 mmol/L (ref 22–32)
Calcium: 9.2 mg/dL (ref 8.9–10.3)
Creatinine, Ser: 1.42 mg/dL — ABNORMAL HIGH (ref 0.44–1.00)
GFR calc Af Amer: 44 mL/min — ABNORMAL LOW (ref >60)
GFR calc non Af Amer: 38 mL/min — ABNORMAL LOW (ref >60)
Glucose, Bld: 230 mg/dL — ABNORMAL HIGH (ref 70–99)
Potassium: 4.3 mmol/L (ref 3.5–5.1)
SODIUM: 131 mmol/L — AB (ref 135–145)

## 2019-03-24 LAB — GLUCOSE, CAPILLARY
GLUCOSE-CAPILLARY: 189 mg/dL — AB (ref 70–99)
Glucose-Capillary: 196 mg/dL — ABNORMAL HIGH (ref 70–99)
Glucose-Capillary: 234 mg/dL — ABNORMAL HIGH (ref 70–99)
Glucose-Capillary: 251 mg/dL — ABNORMAL HIGH (ref 70–99)
Glucose-Capillary: 214 mg/dL — ABNORMAL HIGH (ref 70–99)

## 2019-03-24 NOTE — Progress Notes (Signed)
Occupational Therapy Session Note  Patient Details  Name: [name] MRN: [id number] Date of Birth: Oct 16, 1951  Today's Date: 03/24/2019 OT Individual Time: 7371-0626 OT Individual Time Calculation (min): 75 min    Short Term Goals: Week 2:  OT Short Term Goal 1 (Week 2): Pt will complete toilet transfers min assist with slide board OT Short Term Goal 2 (Week 2): Pt will complete LB dressing min assist at sit > stand level OT Short Term Goal 3 (Week 2): Pt will complete hygiene with toileting with min assist and min cues for sequencing  Skilled Therapeutic Interventions/Progress Updates:    1:1. Pt received in w/c with RN delivering pain medication. Pt very upset about pain medication being reduced and perseverates throughotu session on "how many pills hes [MD] making me take and Im still taking 20!" OT applies towel to residual limb pad for more cushion/pain relief. Pt requesting to wash hair. Pt propels w/c to sink with VC for close quarters steering/turning. Pt completes hair washing at sink with increased time and VC for locking w/c brakes when leaning forward towards sink. Pt dries/styles hair with hairdryer seated at sink for BUE strengthening during bimanual task. Pt dresses at sink at sit to stand level for LB dressing with MOD A for standing balance/advancing pants past hips during clothing managment and S for UB dressing. Pt veryself limiting and participates in discussion about adjustment post-amputation as well as  Reasoning behind therapy "pushing" her. Pt states, "everything will be easier at home." pt propels w/c to tx gym with 1 rest break and max encouragement for participation. Pt completes 3x20 beach ball volley with 2# dowel rod and 2x5 w/c push ups with demonstration by OT for technique to strengthen BUE required for functional transfers and ADLs. Exited session with pt seated in w/c, call light in reach and belt alarm on  Therapy Documentation Precautions:   Precautions Precautions: Fall Precaution Comments: watch BP Restrictions Weight Bearing Restrictions: Yes RLE Weight Bearing: Non weight bearing Other Position/Activity Restrictions: Pt R NWB with new BKA   Therapy/Group: Individual Therapy  Tonny Branch 03/24/2019, 9:44 AM

## 2019-03-24 NOTE — Progress Notes (Signed)
Social Work Patient ID: [name], female   DOB: 07/23/1951, 68 y.o.   MRN: [id number] Met with pt and daughter called to let this worker know she can be here on Monday from 10-12. Will let team know and work on discharge needs.

## 2019-03-24 NOTE — Progress Notes (Signed)
Occupational Therapy Note  Patient Details  Name: [name] MRN: [id number] Date of Birth: 12-Feb-1951  Today's Date: 03/24/2019 OT Missed Time: 75 Minutes Missed Time Reason: Patient unwilling/refused to participate without medical reason;Pain;Patient fatigue  Pt greeted sidelying in bed. OT encouraged pt to participate in therapy, but she was adamant that she was too tired. OT will follow up per plan of care.   Daneen Schick [name] 03/24/2019, 2:40 PM

## 2019-03-24 NOTE — Progress Notes (Signed)
Resting during the night, awaken for personal care and pain meds( Oxy.5mg  po), repositioned for comfort, Noted occasional non-productive cough this morning after awaken, .Incision from BKA with no drainage, Continue to provide support, call bell within reach, bed alarm on,

## 2019-03-24 NOTE — Progress Notes (Addendum)
Physical Therapy Session Note  Patient Details  Name: [name] MRN: [id number] Date of Birth: February 18, 1951  Today's Date: 03/24/2019 PT Individual Time: [phone] and 1110-1150 PT Individual Time Calculation (min): 24 min and 40 min   Short Term Goals: Week 2:  PT Short Term Goal 1 (Week 2): = LTG  Skilled Therapeutic Interventions/Progress Updates:    pt rec'd in bed, residual limb noted to have drainage.  Dressing changed and PT rewraps limb to improve shaping and reduce swelling.  Pt then agreeable to get out of bed to await session with OT.  Pt performs supine to sit with supervision, sliding board transfer to level surface with close supervision.  Pt left in w/c with needs at hand, alarm set.  Session 2: Pt c/o residual limb pain, pain meds given just prior to session.  Pt propels w/c 65' with supervision, max encouragement and frequent rest breaks. Seated therex for hip and knee strengthening for bilat LEs 2 x 12 glute squeeze, hip add squeeze, hip abd/add, hip flex, LAQ.  Pt c/o "upset stomach", gingerale provided and pt states "that helps".  Pt performs sliding board transfer slightly uphill to bed with CGA.  Pt left in bed with alarm set, needs at hand.  Therapy Documentation Precautions:  Precautions Precautions: Fall Precaution Comments: watch BP Restrictions Weight Bearing Restrictions: Yes RLE Weight Bearing: Non weight bearing Other Position/Activity Restrictions: Pt R NWB with new BKA Pain:  pt with no c/o pain this session   Therapy/Group: Individual Therapy  [name] Favorite 03/24/2019, 8:49 AM

## 2019-03-24 NOTE — Progress Notes (Signed)
[facility] PHYSICAL MEDICINE & REHABILITATION PROGRESS NOTE  Subjective/Complaints: Patient seen sitting up in bed this morning.  She states she did not sleep well overnight because she fell asleep too early last night and woke up early in the morning and cannot fall back asleep.  She slept well overnight per nursing.  Prolonged discussion with patient regarding pain medications and side effects as well as previously tried medication with their side effects.  Encouraged desensitization techniques.  Patient request something for anxiety at night.  ROS: Denies CP, shortness of breath, nausea, vomiting, diarrhea.  Objective: Vital Signs: Blood pressure (!) 98/49, pulse 76, temperature 98.4 F (36.9 C), temperature source Oral, resp. rate 18, height 5\' 9"  (1.753 m), weight 87.5 kg, SpO2 98 %. No results found. No results for input(s): WBC, HGB, HCT, PLT in the last 72 hours. Recent Labs    03/23/19 0436 03/24/19 0527  NA 131* 131*  K 4.0 4.3  CL 93* 91*  CO2 26 27  GLUCOSE 208* 230*  BUN 21 19  CREATININE 1.47* 1.42*  CALCIUM 9.0 9.2    Physical Exam: BP (!) 98/49 (BP Location: Left Arm)   Pulse 76   Temp 98.4 F (36.9 C) (Oral)   Resp 18   Ht 5\' 9"  (1.753 m)   Wt 87.5 kg   SpO2 98%   BMI 28.49 kg/m  Constitutional: No distress . Vital signs reviewed. HENT: Normocephalic.  Atraumatic. Eyes: EOMI. No discharge. Cardiovascular: RRR.  No JVD. Respiratory: CTA bilaterally.  Normal effort. GI: BS +. Non-distended. Musc: No edema or tenderness in extremities. Musculoskeletal: Right stump edema with tenderness, improving Neurological: She is alert and oriented x3.  She follows full commands. Motor: Bilateral upper extremities: 4-4+/5 proximal distal, stable Left lower extremity: 4/5 proximal distal, stable Right lower extremity: Hip flexion 4-/5 (pain inhibition), stable Sensation diminished to light touch left foot Skin: Right stump with dressing C/D/I Stage II on patella  at location of cast.  Psychiatric: Her affect is blunt.   Assessment/Plan: 1. 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  Physiatrist is providing close team supervision and 24 hour management of active medical problems listed below.  Physiatrist and rehab team continue to assess barriers to discharge/monitor patient progress toward functional and medical goals  Care Tool:  Bathing  Bathing activity did not occur: Refused Body parts bathed by patient: Right arm, Left arm, Chest, Abdomen, Face   Body parts bathed by helper: Front perineal area, Buttocks Body parts n/a: Right lower leg(Rt BKA)   Bathing assist Assist Level: Moderate Assistance - Patient 50 - 74%     Upper Body Dressing/Undressing Upper body dressing   What is the patient wearing?: Pull over shirt    Upper body assist Assist Level: Set up assist    Lower Body Dressing/Undressing Lower body dressing      What is the patient wearing?: Pants     Lower body assist Assist for lower body dressing: Moderate Assistance - Patient 50 - 74%     Toileting Toileting Toileting Activity did not occur Landscape architect and hygiene only): Safety/medical concerns(pt used bedpan)  Toileting assist Assist for toileting: Moderate Assistance - Patient 50 - 74%     Transfers Chair/bed transfer  Transfers assist  Chair/bed transfer activity did not occur: Safety/medical concerns  Chair/bed transfer assist level: Supervision/Verbal cueing     Locomotion Ambulation  Ambulation assist   Ambulation activity did not occur: Safety/medical concerns          Walk 10 feet activity   Assist           Walk 50 feet activity   Assist           Walk 150 feet activity   Assist           Walk 10 feet on uneven surface  activity   Assist Walk 10 feet on  uneven surfaces activity did not occur: Safety/medical concerns         Wheelchair     Assist Will patient use wheelchair at discharge?: Yes Type of Wheelchair: Manual    Wheelchair assist level: Supervision/Verbal cueing Max wheelchair distance: 50 ft    Wheelchair 50 feet with 2 turns activity    Assist    Wheelchair 50 feet with 2 turns activity did not occur: Safety/medical concerns   Assist Level: Minimal Assistance - Patient > 75%   Wheelchair 150 feet activity     Assist Wheelchair 150 feet activity did not occur: Safety/medical concerns          Medical Problem List and Plan: 1.  Decreased functional mobility secondary to right BKA 81/12/7508 complicated by perioperative non-STEMI/septic shock             Continue CIR  Stump shrinker ordered, pending 2.  Antithrombotics: -DVT/anticoagulation:  Subcutaneous heparin             -antiplatelet therapy:  Aspirin 81 mg daily, Plavix 75 mg daily 3. Pain Management:  Neurontin 600 mg 3 times a day, decreased to 300 3 times daily on 3/26  Tramadol changed to 100 mg 3 times a day prn on 3/20, decreased to 50 on 3/24  Oxycodone as needed  Continue to provide encouragement and coping mechanisms, appreciate neuropsych  Discussed desensitization techniques and decrease reliance on pain medications, especially due to side effects-patient in agreement 4. Mood: Klonopin 0.5 mg twice a day as needed, reminded patient and encouraged to take, will consider scheduling if necessary             -antipsychotic agents: see above 5. Neuropsych: This patient is capable of making decisions on his own behalf. 6. Skin/Wound Care:  Routine skin checks  Wound care to stump 7. Fluids/Electrolytes/Nutrition: routine and out's  8. Acute blood loss anemia.   Hemoglobin 8.6 on 3/24  Continue to monitor 9. 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 Monitor for any signs of fluid overload. Lanoxin 0.125 mg  daily, Aldactone 12.5 mg daily, Demadex 40 mg daily. Patient is off ProAmatine.   Filed Weights   03/22/19 0545 03/23/19 0653 03/24/19 0640  Weight: 88.6 kg 87.1 kg 87.5 kg   Stable on 3/27 10. 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 Hemoglobin A1c 7.8.   Lantus insulin 60 units twice a day, decreased to 30 BID on 3/17, increased to 45 twice daily on 3/18, increased to 50 on 3/20  Cont NovoLog 7 units 3 times a day.    Reglan decreased to 5 mg nightly on 3/26  Labile on 3/27, monitor for trend, variable p.o. intake, will likely need to make further adjustments to medications  Monitor with increased mobility 11. Hyperlipidemia. Lipitor 12.  Supplemental oxygen dependent             Weaned supplemental oxygen  Encouraged IS 13. CAD  S/p NSTEMI  Digoxin level within normal limits on  3/24  Appreciate HF recs 14. Hyponatremia  Na 131 on 3/27  Labs ordered for Monday  Cont to monitor 15. Hypokalemia: Resolved after supplementation 16. AKI  Cr 1.42 on 3/27  Labs ordered for Monday  Encourage fluids  Cont to monitor 17. Leukocytosis: Resolved  Cont to monitor 18.  Borderline hypotension  Asymptomatic on 3/27  Recs per heart failure team  Continue monitor with increased mobility  LOS: 11 days A FACE TO FACE EVALUATION WAS PERFORMED  Deeana Atwater Lorie Phenix 03/24/2019, 10:20 AM

## 2019-03-25 ENCOUNTER — Inpatient Hospital Stay (HOSPITAL_COMMUNITY): Payer: Medicare Other

## 2019-03-25 ENCOUNTER — Inpatient Hospital Stay (HOSPITAL_COMMUNITY): Payer: Medicare Other | Admitting: Occupational Therapy

## 2019-03-25 LAB — BASIC METABOLIC PANEL
Anion gap: 14 (ref 5–15)
BUN: 20 mg/dL (ref 8–23)
CO2: 27 mmol/L (ref 22–32)
Calcium: 9.2 mg/dL (ref 8.9–10.3)
Chloride: 92 mmol/L — ABNORMAL LOW (ref 98–111)
Creatinine, Ser: 1.4 mg/dL — ABNORMAL HIGH (ref 0.44–1.00)
GFR calc Af Amer: 44 mL/min — ABNORMAL LOW (ref >60)
GFR calc non Af Amer: 38 mL/min — ABNORMAL LOW (ref >60)
Glucose, Bld: 139 mg/dL — ABNORMAL HIGH (ref 70–99)
Potassium: 4.3 mmol/L (ref 3.5–5.1)
Sodium: 133 mmol/L — ABNORMAL LOW (ref 135–145)

## 2019-03-25 LAB — GLUCOSE, CAPILLARY
GLUCOSE-CAPILLARY: 185 mg/dL — AB (ref 70–99)
Glucose-Capillary: 135 mg/dL — ABNORMAL HIGH (ref 70–99)
Glucose-Capillary: 268 mg/dL — ABNORMAL HIGH (ref 70–99)
Glucose-Capillary: 292 mg/dL — ABNORMAL HIGH (ref 70–99)

## 2019-03-25 NOTE — Progress Notes (Signed)
Occupational Therapy Session Note  Patient Details  Name: [name] MRN: [id number] Date of Birth: [birth year]  Today's Date: 03/25/2019 OT Individual Time: 2395-3202 OT Individual Time Calculation (min): 29 min    Short Term Goals: Week 1:  OT Short Term Goal 1 (Week 1): Pt will complete LB dressing max assist at sit > stand level OT Short Term Goal 1 - Progress (Week 1): Met OT Short Term Goal 2 (Week 1): Pt will complete bathing with mod assist at sit > stand level OT Short Term Goal 2 - Progress (Week 1): Met OT Short Term Goal 3 (Week 1): Pt will complete toilet transfer mod assist  OT Short Term Goal 3 - Progress (Week 1): Met OT Short Term Goal 4 (Week 1): Pt will complete sit > stand with mod assist to decrease burden of care with LB dressing OT Short Term Goal 4 - Progress (Week 1): Met  Skilled Therapeutic Interventions/Progress Updates:    1;1. Pt received in bed verbose about how upset she is about having totake the potassium pill that "gives me cramps." Pt completes slide board transfer with A to place and steady board only during uphill transfer. Pt completes sit tostand from [facility] with  MIN A and pt refuses to attempt to complete clothing management. Pt attempts to wipe, however unable to reach despite lateral leans. Pt completes SBT>w/c as stated above. Exited session with pt seated in w/c, call light in reach and belt alarm on  Therapy Documentation Precautions:  Precautions Precautions: Fall Precaution Comments: watch BP Restrictions Weight Bearing Restrictions: Yes RLE Weight Bearing: Non weight bearing Other Position/Activity Restrictions: Pt R NWB with new BKA General:   Vital Signs:  Pain:   ADL:   Vision   Perception    Praxis   Exercises:   Other Treatments:     Therapy/Group: Individual Therapy  Tonny Branch 03/25/2019, 10:30 AM

## 2019-03-25 NOTE — Progress Notes (Addendum)
Occupational Therapy Session Note  Patient Details  Name: [name] MRN: [id number] Date of Birth: 1951-03-29  Today's Date: 03/25/2019 OT Individual Time: 3748-2707 OT Individual Time Calculation (min): 42 min   Skilled Therapeutic Interventions/Progress Updates:    Pt greeted in bed reporting no pain at rest. Teary regarding her present situation and upset that she has to continue her CIR stay and therapies. Provided calming cues, gentle education, and encouragement. Pt in time agreeable to transition EOB (with supervision). Then OT rewrapped residual limb bandages via figure 8 technique due to wrappings coming off. She reported need to use the bathroom. Min A stand pivot<BSC using RW with max calming cues (sit<stand from elevated bed). Pt with continent BM, unable to perform seated hygiene and reluctant to try additional adaptive methods. She stood for 1 minute intervals for OT to complete perihygiene, brief change, and clothing mgt. Pt expressed limited ability to assist due to visual deficits. Sit<stand from [facility] completed with Min-Mod A. Pt with 1 spell of n/v with RN made aware. She then completed stand pivot to bedside recliner, Mod A sit<stand and Min A for transfer using device. Pt repositioned for comfort with safety belt fastened and all needs within reach. Tx focus placed on balance, functional transfers, and standing endurance.   Therapy Documentation Precautions:  Precautions Precautions: Fall Precaution Comments: watch BP Restrictions Weight Bearing Restrictions: Yes RLE Weight Bearing: Non weight bearing Other Position/Activity Restrictions: Pt R NWB with new BKA Vital Signs: Therapy Vitals Temp: 98.5 F (36.9 C) Temp Source: Oral Pulse Rate: 72 Resp: 16 BP: (!) 100/33 Patient Position (if appropriate): Lying Oxygen Therapy SpO2: 100 % O2 Device: Room Air Pain: Pain and cramping with movement. Pt declined for OT to inquire RN for pain medication     ADL:       Therapy/Group: Individual Therapy  Skeet Simmer 03/25/2019, 4:43 PM

## 2019-03-25 NOTE — Progress Notes (Signed)
Occupational Therapy Session Note  Patient Details  Name: [name] MRN: [id number] Date of Birth: 1951/02/02  Today's Date: 03/25/2019 OT Individual Time: - 840-930 Total indivi time= 50 minutes   Short Term Goals: Week 2:  OT Short Term Goal 1 (Week 2): Pt will complete toilet transfers min assist with slide board OT Short Term Goal 2 (Week 2): Pt will complete LB dressing min assist at sit > stand level OT Short Term Goal 3 (Week 2): Pt will complete hygiene with toileting with min assist and min cues for sequencing  Skilled Therapeutic Interventions/Progress Updates: Upon approach for OT patient sitting edgeo f bed and stated she was getting groggy from the pain meds she had just taken.    She elected to complete bathing and dressing edge of bed and then reluctantly participated in seated endurance and balance acrtivities edge of bed.  For bathing, she stated she was too groggy to complete sit to stand or standing at  [facility] or walker for pericleansing or to pull up pants.    She cmpleted upper body bathing and dressing with set up (edge of bed).   Lower body batheing she stated she had just donned a clean brief with nursing.   She stated she was afraid to reach forward edge of bed to don pants and socks.   She completed donning pants with lateral leans on bed with extra time and moderate assistance.   She crossed leg over knee to don left sock with extra time and max A.    As well, she completed lateral leans and strengthening, along with dynamic sitting balance.    After a bit of time, she stopped and stated, "enough before I am too sleepy and tired."    She was left seated edge of bed at her request with alarm set.   She stated she would ly back in a bit of time.     Therapy Documentation Precautions:  Precautions Precautions: Fall Precaution Comments: watch BP Restrictions Weight Bearing Restrictions: Yes RLE Weight Bearing: Non weight bearing Other Position/Activity  Restrictions: Pt R NWB with new BKA Pain:   Therapy/Group: Individual Therapy  Alfredia Ferguson G [facility] 03/25/2019, 10:14 AM

## 2019-03-25 NOTE — Progress Notes (Signed)
[facility] PHYSICAL MEDICINE & REHABILITATION PROGRESS NOTE  Subjective/Complaints:  Pt c/o cramping"all over"  Mainly at night, hands and abdomen Pt pretty sure it is her potassium level, last 2 K+ have been normal on 3/27 and 3/28 Pt also mentions she took a muscle relaxer and "maybe valium" at home   ROS: Denies CP, shortness of breath, nausea, vomiting, diarrhea.  Objective: Vital Signs: Blood pressure (!) 119/52, pulse 66, temperature (!) 97.4 F (36.3 C), temperature source Oral, resp. rate 20, height 5\' 9"  (1.753 m), weight 87.5 kg, SpO2 97 %. No results found. No results for input(s): WBC, HGB, HCT, PLT in the last 72 hours. Recent Labs    03/24/19 0527 03/25/19 0554  NA 131* 133*  K 4.3 4.3  CL 91* 92*  CO2 27 27  GLUCOSE 230* 139*  BUN 19 20  CREATININE 1.42* 1.40*  CALCIUM 9.2 9.2    Physical Exam: BP (!) 119/52 (BP Location: Left Arm)   Pulse 66   Temp (!) 97.4 F (36.3 C) (Oral)   Resp 20   Ht 5\' 9"  (1.753 m)   Wt 87.5 kg   SpO2 97%   BMI 28.49 kg/m  Constitutional: No distress . Vital signs reviewed. HENT: Normocephalic.  Atraumatic. Eyes: EOMI. No discharge. Cardiovascular: RRR.  No JVD. Respiratory: CTA bilaterally.  Normal effort. GI: BS +. Non-distended. Musc: No edema or tenderness in extremities. Musculoskeletal: Right stump edema with tenderness, improving Neurological: She is alert and oriented x3.  She follows full commands. Motor: Bilateral upper extremities: 4/5 proximal distal, stable Left lower extremity: 4/5 proximal distal, stable Right lower extremity: Hip flexion 4-/5 (pain inhibition), stable Sensation diminished to light touch left foot  Normal tone in BUE  Skin: Right stump with dressing C/D/I Stage II on patella at location of cast.  Psychiatric: Her affect is blunt.   Assessment/Plan: 1. 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  Physiatrist is providing close team supervision and 24 hour management of active medical problems listed below.  Physiatrist and rehab team continue to assess barriers to discharge/monitor patient progress toward functional and medical goals  Care Tool:  Bathing  Bathing activity did not occur: Refused Body parts bathed by patient: Right arm, Left arm, Chest, Abdomen, Face   Body parts bathed by helper: Front perineal area, Buttocks Body parts n/a: Right lower leg(Rt BKA)   Bathing assist Assist Level: Moderate Assistance - Patient 50 - 74%     Upper Body Dressing/Undressing Upper body dressing   What is the patient wearing?: Pull over shirt    Upper body assist Assist Level: Set up assist    Lower Body Dressing/Undressing Lower body dressing      What is the patient wearing?: Pants     Lower body assist Assist for lower body dressing: Moderate Assistance - Patient 50 - 74%     Toileting Toileting Toileting Activity did not occur Landscape architect and hygiene only): Safety/medical concerns(pt used bedpan)  Toileting assist Assist for toileting: Moderate Assistance - Patient 50 - 74%     Transfers Chair/bed transfer  Transfers assist  Chair/bed transfer activity did not occur: Safety/medical concerns  Chair/bed transfer assist level: Supervision/Verbal cueing     Locomotion Ambulation   Ambulation assist   Ambulation activity did not occur: Safety/medical concerns          Walk 10 feet  activity   Assist           Walk 50 feet activity   Assist           Walk 150 feet activity   Assist           Walk 10 feet on uneven surface  activity   Assist Walk 10 feet on uneven surfaces activity did not occur: Safety/medical concerns         Wheelchair     Assist Will patient use wheelchair at discharge?: Yes Type of Wheelchair: Manual    Wheelchair  assist level: Supervision/Verbal cueing Max wheelchair distance: 50 ft    Wheelchair 50 feet with 2 turns activity    Assist    Wheelchair 50 feet with 2 turns activity did not occur: Safety/medical concerns   Assist Level: Minimal Assistance - Patient > 75%   Wheelchair 150 feet activity     Assist Wheelchair 150 feet activity did not occur: Safety/medical concerns          Medical Problem List and Plan: 1.  Decreased functional mobility secondary to right BKA 02/54/2706 complicated by perioperative non-STEMI/septic shock             Continue CIR  Stump shrinker ordered, pending 2.  Antithrombotics: -DVT/anticoagulation:  Subcutaneous heparin             -antiplatelet therapy:  Aspirin 81 mg daily, Plavix 75 mg daily 3. Pain Management:  Neurontin 600 mg 3 times a day, decreased to 300 3 times daily on 3/26  Tramadol changed to 100 mg 3 times a day prn on 3/20, decreased to 50 on 3/24  Oxycodone as needed  Continue to provide encouragement and coping mechanisms, appreciate neuropsych   4. Mood: Klonopin 0.5 mg twice a day as needed, reminded patient and encouraged to take, will consider scheduling if necessary             -antipsychotic agents: see above 5. Neuropsych: This patient is capable of making decisions on his own behalf. 6. Skin/Wound Care:  Routine skin checks  Wound care to stump 7. Fluids/Electrolytes/Nutrition: routine and out's  8. Acute blood loss anemia.   Hemoglobin 8.6 on 3/24  Continue to monitor 9. 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 Monitor for any signs of fluid overload. Lanoxin 0.125 mg daily, Aldactone 12.5 mg daily, Demadex 40 mg daily. Patient is off ProAmatine.   Filed Weights   03/22/19 0545 03/23/19 0653 03/24/19 0640  Weight: 88.6 kg 87.1 kg 87.5 kg   Stable on 3/27 10. 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 Hemoglobin A1c 7.8.   Lantus insulin 60 units twice a day,  decreased to 30 BID on 3/17, increased to 45 twice daily on 3/18, increased to 50 on 3/20  Cont NovoLog 7 units 3 times a day.    Reglan decreased to 5 mg nightly on 3/26  Labile on 3/27, monitor for trend, variable p.o. intake, will likely need to make further adjustments to medications  Monitor with increased mobility 11. Hyperlipidemia. Lipitor 12.  Supplemental oxygen dependent             Weaned supplemental oxygen  Encouraged IS 13. CAD  S/p NSTEMI  Digoxin level within normal limits on 3/24  Appreciate HF recs 14. Hyponatremia  Na 131 on 3/27  Labs ordered for Monday  Cont to monitor 15. Hypokalemia: Resolved after supplementation 16. AKI  Cr 1.42 on 3/27  Labs ordered for Monday  Encourage fluids  Cont to monitor 17. Leukocytosis: Resolved  Cont to monitor 18.  Borderline hypotension  Asymptomatic on 3/27  Recs per heart failure team  Continue monitor with increased mobility 19.  "cramping and hands and abd- Lytes look ok (very mild hypoNa+) Already on prn klonopin, add low dose robaxin qhs   LOS: 12 days A FACE TO FACE EVALUATION WAS PERFORMED  Charlett Blake 03/25/2019, 10:36 AM

## 2019-03-26 ENCOUNTER — Inpatient Hospital Stay (HOSPITAL_COMMUNITY): Payer: Medicare Other

## 2019-03-26 LAB — BASIC METABOLIC PANEL
Anion gap: 12 (ref 5–15)
BUN: 23 mg/dL (ref 8–23)
CO2: 27 mmol/L (ref 22–32)
Calcium: 9.1 mg/dL (ref 8.9–10.3)
Chloride: 93 mmol/L — ABNORMAL LOW (ref 98–111)
Creatinine, Ser: 1.53 mg/dL — ABNORMAL HIGH (ref 0.44–1.00)
GFR calc Af Amer: 40 mL/min — ABNORMAL LOW (ref >60)
GFR calc non Af Amer: 34 mL/min — ABNORMAL LOW (ref >60)
Glucose, Bld: 154 mg/dL — ABNORMAL HIGH (ref 70–99)
Potassium: 3.9 mmol/L (ref 3.5–5.1)
Sodium: 132 mmol/L — ABNORMAL LOW (ref 135–145)

## 2019-03-26 LAB — GLUCOSE, CAPILLARY
Glucose-Capillary: 130 mg/dL — ABNORMAL HIGH (ref 70–99)
Glucose-Capillary: 152 mg/dL — ABNORMAL HIGH (ref 70–99)
Glucose-Capillary: 200 mg/dL — ABNORMAL HIGH (ref 70–99)
Glucose-Capillary: 248 mg/dL — ABNORMAL HIGH (ref 70–99)

## 2019-03-26 MED ORDER — METHOCARBAMOL 500 MG PO TABS
500.0000 mg | ORAL_TABLET | Freq: Every day | ORAL | Status: DC
  Administered 2019-03-26 – 2019-03-29 (×4): 500 mg via ORAL
  Filled 2019-03-26 (×4): qty 1

## 2019-03-26 MED ORDER — OXYCODONE HCL 5 MG PO TABS
2.5000 mg | ORAL_TABLET | ORAL | Status: DC | PRN
  Administered 2019-03-26 – 2019-03-27 (×3): 2.5 mg via ORAL
  Filled 2019-03-26 (×5): qty 1

## 2019-03-26 MED ORDER — TRAMADOL HCL 50 MG PO TABS
100.0000 mg | ORAL_TABLET | Freq: Three times a day (TID) | ORAL | Status: DC
  Administered 2019-03-26 – 2019-03-30 (×11): 100 mg via ORAL
  Filled 2019-03-26 (×13): qty 2

## 2019-03-26 NOTE — Progress Notes (Signed)
Physical Therapy Session Note  Patient Details  Name: [name] MRN: [id number] Date of Birth: Oct 07, 1951  Today's Date: 03/26/2019 PT Individual Time: 1445-1515 PT Individual Time Calculation (min): 30 min   Short Term Goals: Week 2:  PT Short Term Goal 1 (Week 2): = LTG  Skilled Therapeutic Interventions/Progress Updates:    Pt seated EOB with RN upon PT arrival, agreeable to therapy tx and reports pain 4/10 in R distal LE. RN present to administer pain medication. Pt performed slideboard transfer with min assist to the w/c, verbal cues for techniques. Pt transported to the gym. Pt performed slideboard transfers w/c<>mat this session with min assist for both directions, cues for techniques. Pt performed x 2 sit<>stand from mat this session with RW and min assist, in standing pt worked on balance while performing horseshoe toss, x 2 trials. Pt worked on sitting balance while tossing horseshoes, x 2 trials with supervision. Pt performed slideboard transfer back to w/c min assist and transported back to room. Transferred back to bed with slideboard and min assist, transferred to supine and left with needs in reach and bed alarm set.    Therapy Documentation Precautions:  Precautions Precautions: Fall Precaution Comments: watch BP Restrictions Weight Bearing Restrictions: Yes RLE Weight Bearing: Non weight bearing Other Position/Activity Restrictions: Pt R NWB with new BKA    Therapy/Group: Individual Therapy  Netta Corrigan, PT, DPT 03/26/2019, 1:05 PM

## 2019-03-26 NOTE — Progress Notes (Signed)
[place] PHYSICAL MEDICINE & REHABILITATION PROGRESS NOTE  Subjective/Complaints:  Cramps a little better last noc but still kept her awake, wants to go back on 100mg  tramadol TID- "been on it 24yrs"   ROS: Denies CP, shortness of breath, nausea, vomiting, diarrhea.  Objective: Vital Signs: Blood pressure (!) 106/53, pulse 68, temperature 98.3 F (36.8 C), temperature source Oral, resp. rate 18, height 5\' 9"  (1.753 m), weight 88.2 kg, SpO2 98 %. No results found. No results for input(s): WBC, HGB, HCT, PLT in the last 72 hours. Recent Labs    03/25/19 0554 03/26/19 0513  NA 133* 132*  K 4.3 3.9  CL 92* 93*  CO2 27 27  GLUCOSE 139* 154*  BUN 20 23  CREATININE 1.40* 1.53*  CALCIUM 9.2 9.1    Physical Exam: BP (!) 106/53   Pulse 68   Temp 98.3 F (36.8 C) (Oral)   Resp 18   Ht 5\' 9"  (1.753 m)   Wt 88.2 kg   SpO2 98%   BMI 28.71 kg/m  Constitutional: No distress . Vital signs reviewed. HENT: Normocephalic.  Atraumatic. Eyes: EOMI. No discharge. Cardiovascular: RRR.  No JVD. Respiratory: CTA bilaterally.  Normal effort. GI: BS +. Non-distended. Musc: No edema or tenderness in extremities. Musculoskeletal: Right stump edema with tenderness, improving Neurological: She is alert and oriented x3.  She follows full commands. Motor: Bilateral upper extremities: 4/5 proximal distal, stable Left lower extremity: 4/5 proximal distal, stable Right lower extremity: Hip flexion 4-/5 (pain inhibition), stable Sensation diminished to light touch left foot  Normal tone in BUE  Skin: Right stump with dressing C/D/I Stage II on patella at location of cast.  Psychiatric: Her affect is blunt.   Assessment/Plan: 1. 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  Physiatrist is providing close team supervision and 24 hour management of  active medical problems listed below.  Physiatrist and rehab team continue to assess barriers to discharge/monitor patient progress toward functional and medical goals  Care Tool:  Bathing  Bathing activity did not occur: Refused Body parts bathed by patient: Right arm, Left arm, Chest, Abdomen, Face   Body parts bathed by helper: Front perineal area, Buttocks Body parts n/a: Right lower leg(Rt BKA)   Bathing assist Assist Level: Moderate Assistance - Patient 50 - 74%     Upper Body Dressing/Undressing Upper body dressing   What is the patient wearing?: Pull over shirt    Upper body assist Assist Level: Set up assist    Lower Body Dressing/Undressing Lower body dressing      What is the patient wearing?: Pants     Lower body assist Assist for lower body dressing: Moderate Assistance - Patient 50 - 74%     Toileting Toileting Toileting Activity did not occur Landscape architect and hygiene only): Safety/medical concerns(pt used bedpan)  Toileting assist Assist for toileting: Maximal Assistance - Patient 25 - 49%     Transfers Chair/bed transfer  Transfers assist  Chair/bed transfer activity did not occur: Safety/medical concerns  Chair/bed transfer assist level: Supervision/Verbal cueing     Locomotion Ambulation   Ambulation assist   Ambulation activity did not occur: Safety/medical concerns          Walk 10 feet activity   Assist           Walk 50 feet activity   Assist  Walk 150 feet activity   Assist           Walk 10 feet on uneven surface  activity   Assist Walk 10 feet on uneven surfaces activity did not occur: Safety/medical concerns         Wheelchair     Assist Will patient use wheelchair at discharge?: Yes Type of Wheelchair: Manual    Wheelchair assist level: Supervision/Verbal cueing Max wheelchair distance: 50 ft    Wheelchair 50 feet with 2 turns activity    Assist    Wheelchair  50 feet with 2 turns activity did not occur: Safety/medical concerns   Assist Level: Minimal Assistance - Patient > 75%   Wheelchair 150 feet activity     Assist Wheelchair 150 feet activity did not occur: Safety/medical concerns          Medical Problem List and Plan: 1.  Decreased functional mobility secondary to right BKA 36/14/4315 complicated by perioperative non-STEMI/septic shock             Continue CIR  Stump shrinker ordered, pending 2.  Antithrombotics: -DVT/anticoagulation:  Subcutaneous heparin             -antiplatelet therapy:  Aspirin 81 mg daily, Plavix 75 mg daily 3. Pain Management:  Neurontin 600 mg 3 times a day, decreased to 300 3 times daily on 3/26  Tramadol changed to 100 mg 3 times a day prn on 3/20, decreased to 50 on 3/24, will resume home dose tramadol and reduce oxyIR   Oxycodone reduced to 2.5mg  q 4 h  Continue to provide encouragement and coping mechanisms, appreciate neuropsych   4. Mood: Klonopin 0.5 mg twice a day as needed, reminded patient and encouraged to take, will consider scheduling if necessary             -antipsychotic agents: see above 5. Neuropsych: This patient is capable of making decisions on his own behalf. 6. Skin/Wound Care:  Routine skin checks  Wound care to stump 7. Fluids/Electrolytes/Nutrition: routine and out's  8. Acute blood loss anemia.   Hemoglobin 8.6 on 3/24  Continue to monitor 9. 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 Monitor for any signs of fluid overload. Lanoxin 0.125 mg daily, Aldactone 12.5 mg daily, Demadex 40 mg daily. Patient is off ProAmatine.   Filed Weights   03/23/19 0653 03/24/19 0640 03/26/19 0504  Weight: 87.1 kg 87.5 kg 88.2 kg   Stable on 3/29 10. 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 Hemoglobin A1c 7.8.   Lantus insulin 60 units twice a day, decreased to 30 BID on 3/17, increased to 45 twice daily on 3/18, increased to 50 on  3/20  Cont NovoLog 7 units 3 times a day.    Reglan decreased to 5 mg nightly on 3/26  Labile on 3/27, monitor for trend, variable p.o. intake, will likely need to make further adjustments to medications  Monitor with increased mobility 11. Hyperlipidemia. Lipitor 12.  Supplemental oxygen dependent             Weaned supplemental oxygen  Encouraged IS 13. CAD  S/p NSTEMI  Digoxin level within normal limits on 3/24  Appreciate HF recs 14. Hyponatremia  Na 131 on 3/27  Labs ordered for Monday  Cont to monitor 15. Hypokalemia: Resolved after supplementation 16. AKI  Cr 1.42 on 3/27  Labs ordered for Monday  Encourage fluids  Cont to monitor 17. Leukocytosis: Resolved  Cont to monitor 18.  Borderline hypotension  Asymptomatic on 3/27  Recs per heart failure team  Continue monitor with increased mobility 19.  "cramping and hands and abd- Lytes look ok (very mild hypoNa+), check Mg++ Already on prn klonopin, add low dose robaxin qhs   LOS: 13 days A FACE TO [place] E Eddy Liszewski 03/26/2019, 9:00 AM

## 2019-03-26 NOTE — Progress Notes (Signed)
Occupational Therapy Session Note  Patient Details  Name: [name] MRN: [id number] Date of Birth: Jun 12, 1951  Today's Date: 03/26/2019 OT Individual Time: 8841-6606  Session 2: 3016-0109 OT Individual Time Calculation (min): 71 min Session 2: 54 min   Short Term Goals: Week 2:  OT Short Term Goal 1 (Week 2): Pt will complete toilet transfers min assist with slide board OT Short Term Goal 2 (Week 2): Pt will complete LB dressing min assist at sit > stand level OT Short Term Goal 3 (Week 2): Pt will complete hygiene with toileting with min assist and min cues for sequencing  Skilled Therapeutic Interventions/Progress Updates:    Pt received sitting EOB with immediate c/o pain in "B feet". Pt declined any bathing this session. (S) to don shirt. Pt initially stating "don't try and make me, I can't put on my pants". Retrieved reacher and with min A pt was able to thread B LE. Moderate cueing for use of reacher. Pt required max A to pull up pants in standing. Pt with significant anxiety/self limiting behavior throughout session emotional support provided. Pt used slideboard to transfer toward L to w/c with CGA. Pt was pulled up to sink in w/c to complete hair washing with mod A. Pt reported needing to have BM and completed slideboard transfer toward R with CGA, support on equipment only. Pt refused to attempt clothing management and would not take instruction/cueing from therapist. Pt voided bowel/bladder and "threw up" during- pt hacking and coughing and no emesis. Pt stood with RW with min A to power up and refused to attempt peri hygiene or clothing management. Attempted to have conversation with pt re rehab goals and reducing burden of care but pt unwilling to participate. Pt returned to supine in bed and was left with all needs met, bed alarm set.   Session 2: Pt received supine with no c/o pain. Pt completed slideboard transfer to bari-drop arm BSC with assist for managing  equipment/stabilization only. Pt required max A for clothing management and refused adamantly to attempt and assist. Pt voided urine/bowel and required total A to complete peri hygiene in standing. Pt was, however, able to complete sit <> stand and to maintain standing in RW with CGA for ~1 min. Pt used slideboard to transfer back to w/c, CGA. Pt completed ~15 ft of w/c propulsion before c/o fatigue and declining further propulsion attempts. Pt was brought outside to attempt and build more therapeutic rapport and increase mood. Pt enjoyed outdoors and engaged in discussion re d/c and approach to therapy. Pt was edu on desensitization principle/techniques and given demo. Upon return to room pt was given a handout reinforcing discussion and for family to use. Pt returned to supine in bed and was left with all needs met, bed alarm set.   Therapy Documentation Precautions:  Precautions Precautions: Fall Precaution Comments: watch BP Restrictions Weight Bearing Restrictions: Yes RLE Weight Bearing: Non weight bearing Other Position/Activity Restrictions: Pt R NWB with new BKA Pain: Pain Assessment Pain Scale: 0-10 Pain Score: 6  Pain Type: Acute pain;Phantom pain Pain Location: Foot Pain Orientation: Right;Left Pain Descriptors / Indicators: Aching Pain Onset: On-going Pain Intervention(s): Distraction;Emotional support   Therapy/Group: Individual Therapy  Curtis Sites 03/26/2019, 9:05 AM

## 2019-03-27 ENCOUNTER — Inpatient Hospital Stay (HOSPITAL_COMMUNITY): Payer: Medicare Other | Admitting: Physical Therapy

## 2019-03-27 ENCOUNTER — Encounter (HOSPITAL_COMMUNITY): Payer: Medicare Other | Admitting: Occupational Therapy

## 2019-03-27 ENCOUNTER — Ambulatory Visit (HOSPITAL_COMMUNITY): Payer: Medicare Other | Admitting: Physical Therapy

## 2019-03-27 LAB — BASIC METABOLIC PANEL
Anion gap: 14 (ref 5–15)
BUN: 24 mg/dL — ABNORMAL HIGH (ref 8–23)
CO2: 24 mmol/L (ref 22–32)
Calcium: 8.9 mg/dL (ref 8.9–10.3)
Chloride: 95 mmol/L — ABNORMAL LOW (ref 98–111)
Creatinine, Ser: 1.4 mg/dL — ABNORMAL HIGH (ref 0.44–1.00)
GFR calc Af Amer: 44 mL/min — ABNORMAL LOW (ref >60)
GFR calc non Af Amer: 38 mL/min — ABNORMAL LOW (ref >60)
GLUCOSE: 157 mg/dL — AB (ref 70–99)
Potassium: 3.5 mmol/L (ref 3.5–5.1)
SODIUM: 133 mmol/L — AB (ref 135–145)

## 2019-03-27 LAB — GLUCOSE, CAPILLARY
GLUCOSE-CAPILLARY: 181 mg/dL — AB (ref 70–99)
Glucose-Capillary: 144 mg/dL — ABNORMAL HIGH (ref 70–99)
Glucose-Capillary: 241 mg/dL — ABNORMAL HIGH (ref 70–99)
Glucose-Capillary: 238 mg/dL — ABNORMAL HIGH (ref 70–99)

## 2019-03-27 LAB — MAGNESIUM: Magnesium: 1.8 mg/dL (ref 1.7–2.4)

## 2019-03-27 NOTE — Progress Notes (Signed)
Physical Therapy Session Note  Patient Details  Name: [name] MRN: [id number] Date of Birth: [birth year]  Today's Date: 03/27/2019 PT Individual Time: 1100-1125 AND 1445-1550 PT Individual Time Calculation (min): 25 min AND 65 min and Today's Date: 03/27/2019 PT Missed Time: 35 Minutes+ 10 min Missed Time Reason: Patient unwilling to participate;Other (Comment)(low BP)  Short Term Goals: Week 2:  PT Short Term Goal 1 (Week 2): = LTG  Skilled Therapeutic Interventions/Progress Updates:   Session 1:  Pt received in w/c, handoff from OT. Pt feeling lightheaded, BP as detailed below. Agreeable to participate in family education while daughter was present prior to returning to room. No c/o pain. Discussed car pt will be going home in - midsize SUV. Demonstrated stand pivot car transfer at this height w/ CGA using RW. Educated daughter on use of gait belt during transfer for safety as pt not picking up L foot but rather scooting/pivoting foot. Discussed scooting back in chair prior to swinging legs around. Pt refused to practice a 2nd time w/ daughter providing hands-on, pt stated "I'm not doing it again". Verbal and visual education to daughter on hand placement, w/c placement, and use of gait belt. Pt continued to endorse feeling lightheaded. Returned to room and daughter assisted pt back to EOB via slide board transfer, pt performed w/ supervision and 1 verbal cue from therapist to lift up armrest. Brief overview of w/c parts management and residual limb care in preparation for future prosthetic use, both have been educated previously and verbalized understanding. Ended session sitting EOB and in care of daughter - all needs met. Missed 35 min of skilled PT 2/2 refusal and low BP.   Session 2:  Pt in supine and initially refusing to participate, eventually agreeable w/ encouragement and discussion of her choosing what to work on during session. Pt requesting to toilet. Supervision bed  mobility and transfer to w/c via slide board. Min assist transfer to drop arm commode via slide board. Sit<>stand to RW w/ CGA, total assist to remove LE garments per pt's request. She reports her daughter will be performing all clothing management and pericare w/ toileting, pt refused to attempt herself. Pt self-propelled w/c towards therapy gym 50', refused to propel further. Worked on standing balance while performing horseshoe toss from standing, x10 w/ each UE. Min assist for balance and verbal/tactile cues for weight shifting strategies. Worked on UE functional strength and endurance while tossing ball w/ 1kg dowel rod, 2 min x4 reps. Pt stated she was done participating at this point, requesting to return to room. Frequent seated rest breaks throughout session 2/2 fatigue. Returned to room and performed slide board transfer to EOB, supervision. Ended session in supine, all needs in reach.  Therapy Documentation Precautions:  Precautions Precautions: Fall Precaution Comments: watch BP Restrictions Weight Bearing Restrictions: Yes RLE Weight Bearing: Non weight bearing Other Position/Activity Restrictions: Pt R NWB with new BKA General: PT Amount of Missed Time (min): 35 Minutes PT Missed Treatment Reason: Patient unwilling to participate;Other (Comment)(low BP) Vital Signs: Therapy Vitals BP: (!) 94/52 Patient Position (if appropriate): Sitting Pain: Pain Assessment Pain Scale: 0-10 Pain Score: 0-No pain  Therapy/Group: Individual Therapy  Salena Ortlieb K Avian Konigsberg 03/27/2019, 11:40 AM

## 2019-03-27 NOTE — Plan of Care (Signed)
  Problem: Consults Goal: RH LIMB LOSS PATIENT EDUCATION Description Description: See Patient Education module for eduction specifics. Outcome: Progressing   Problem: RH BOWEL ELIMINATION Goal: RH STG MANAGE BOWEL WITH ASSISTANCE Description STG Manage Bowel with min Assistance.  Outcome: Progressing Flowsheets (Taken 03/27/2019 1429) STG: Pt will manage bowels with assistance: 3-Moderate assistance   Problem: RH BLADDER ELIMINATION Goal: RH STG MANAGE BLADDER WITH ASSISTANCE Description STG Manage Bladder With  Min Assistance  Outcome: Progressing Flowsheets (Taken 03/27/2019 1429) STG: Pt will manage bladder with assistance: 4-Minimal assistance   Problem: RH SKIN INTEGRITY Goal: RH STG SKIN FREE OF INFECTION/BREAKDOWN Description Min assist  Outcome: Progressing Goal: RH STG MAINTAIN SKIN INTEGRITY WITH ASSISTANCE Description STG Maintain Skin Integrity With min Assistance.  Outcome: Progressing Flowsheets (Taken 03/27/2019 1429) STG: Maintain skin integrity with assistance: 4-Minimal assistance Goal: RH STG ABLE TO PERFORM INCISION/WOUND CARE W/ASSISTANCE Description STG Able To Perform Incision/Wound Care With  [employer].  Outcome: Progressing Flowsheets (Taken 03/27/2019 1429) STG: Pt will be able to perform incision/wound care with assistance: 4-Minimal assistance   Problem: RH SAFETY Goal: RH STG ADHERE TO SAFETY PRECAUTIONS W/ASSISTANCE/DEVICE Description STG Adhere to Safety Precautions With  Min Assistance/Device.  Outcome: Progressing Flowsheets (Taken 03/27/2019 1429) STG:Pt will adhere to safety precautions with assistance/device: 4-Minimal assistance   Problem: RH PAIN MANAGEMENT Goal: RH STG PAIN MANAGED AT OR BELOW PT'S PAIN GOAL Description Manage pain at or below level 4  Outcome: Progressing   Problem: RH KNOWLEDGE DEFICIT LIMB LOSS Goal: RH STG INCREASE KNOWLEDGE OF SELF CARE AFTER LIMB LOSS Description  dtr to Manage care at discharge  using handouts/resource notebook independently  Outcome: Progressing

## 2019-03-27 NOTE — Progress Notes (Signed)
[facility] PHYSICAL MEDICINE & REHABILITATION PROGRESS NOTE  Subjective/Complaints: Patient seen laying in bed this morning.  She states she slept well overnight.  She states she had a good weekend.  She is much more positive this morning.  ROS: Denies CP, shortness of breath, nausea, vomiting, diarrhea.  Objective: Vital Signs: Blood pressure (!) 94/52, pulse 64, temperature 98 F (36.7 C), temperature source Oral, resp. rate 18, height 5\' 9"  (1.753 m), weight 88.3 kg, SpO2 100 %. No results found. No results for input(s): WBC, HGB, HCT, PLT in the last 72 hours. Recent Labs    03/26/19 0513 03/27/19 0600  NA 132* 133*  K 3.9 3.5  CL 93* 95*  CO2 27 24  GLUCOSE 154* 157*  BUN 23 24*  CREATININE 1.53* 1.40*  CALCIUM 9.1 8.9    Physical Exam: BP (!) 94/52 (BP Location: Left Arm)   Pulse 64   Temp 98 F (36.7 C) (Oral)   Resp 18   Ht 5\' 9"  (1.753 m)   Wt 88.3 kg   SpO2 100%   BMI 28.75 kg/m  Constitutional: No distress . Vital signs reviewed. HENT: Normocephalic.  Atraumatic. Eyes: EOMI. No discharge. Cardiovascular: RRR.  No JVD. Respiratory: CTA bilaterally.  Normal effort. GI: BS +. Non-distended. Musc: No edema or tenderness in extremities. Musculoskeletal: Right stump edema with tenderness, stable Neurological: She is alert and oriented x3.  She follows full commands. Motor: Bilateral upper extremities: 4+/5 proximal distal Left lower extremity: 4+/5 proximal distal Right lower extremity: Hip flexion 4+/5 (pain inhibition) Sensation diminished to light touch left foot Skin: Right stump with dressing C/D/I Stage II on patella at location of cast.  Psychiatric: Her affect is blunt.   Assessment/Plan: 1. 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  Physiatrist is providing close team supervision and 24 hour management  of active medical problems listed below.  Physiatrist and rehab team continue to assess barriers to discharge/monitor patient progress toward functional and medical goals  Care Tool:  Bathing  Bathing activity did not occur: Refused Body parts bathed by patient: Right arm, Left arm, Chest, Abdomen, Face   Body parts bathed by helper: Front perineal area, Buttocks Body parts n/a: Right lower leg(Rt BKA)   Bathing assist Assist Level: Moderate Assistance - Patient 50 - 74%     Upper Body Dressing/Undressing Upper body dressing   What is the patient wearing?: Pull over shirt    Upper body assist Assist Level: Set up assist    Lower Body Dressing/Undressing Lower body dressing      What is the patient wearing?: Pants     Lower body assist Assist for lower body dressing: Moderate Assistance - Patient 50 - 74%     Toileting Toileting Toileting Activity did not occur Landscape architect and hygiene only): Safety/medical concerns(pt used bedpan)  Toileting assist Assist for toileting: Moderate Assistance - Patient 50 - 74%     Transfers Chair/bed transfer  Transfers assist  Chair/bed transfer activity did not occur: Safety/medical concerns  Chair/bed transfer assist level: Minimal Assistance - Patient > 75%     Locomotion Ambulation   Ambulation assist   Ambulation activity did not occur: Safety/medical concerns          Walk 10 feet activity   Assist           Walk 50 feet activity   Assist  Walk 150 feet activity   Assist           Walk 10 feet on uneven surface  activity   Assist Walk 10 feet on uneven surfaces activity did not occur: Safety/medical concerns         Wheelchair     Assist Will patient use wheelchair at discharge?: Yes Type of Wheelchair: Manual    Wheelchair assist level: Supervision/Verbal cueing Max wheelchair distance: 50 ft    Wheelchair 50 feet with 2 turns activity    Assist     Wheelchair 50 feet with 2 turns activity did not occur: Safety/medical concerns   Assist Level: Minimal Assistance - Patient > 75%   Wheelchair 150 feet activity     Assist Wheelchair 150 feet activity did not occur: Safety/medical concerns          Medical Problem List and Plan: 1.  Decreased functional mobility secondary to right BKA 16/09/9603 complicated by perioperative non-STEMI/septic shock             Continue CIR  Stump shrinker ordered, remains pending, will follow-up 2.  Antithrombotics: -DVT/anticoagulation:  Subcutaneous heparin             -antiplatelet therapy:  Aspirin 81 mg daily, Plavix 75 mg daily 3. Pain Management:  Neurontin 600 mg 3 times a day, decreased to 300 3 times daily on 3/26  Tramadol changed to 100 mg 3 times a day prn on 3/20, decreased to 50 on 3/24, increased back to 100  Robaxin added on 3/29  oxycodone reduced to 2.5mg  q 4 h  Continue to provide encouragement and coping mechanisms, appreciate neuropsych 4. Mood: Klonopin 0.5 mg twice a day as needed, reminded patient and encouraged to take, will consider scheduling if necessary             -antipsychotic agents: see above 5. Neuropsych: This patient is capable of making decisions on his own behalf. 6. Skin/Wound Care:  Routine skin checks  Wound care to stump 7. Fluids/Electrolytes/Nutrition: routine and out's  8. Acute blood loss anemia.   Hemoglobin 8.6 on 3/24  Continue to monitor 9. 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 Monitor for any signs of fluid overload. Lanoxin 0.125 mg daily, Aldactone 12.5 mg daily, Demadex 40 mg daily. Patient is off ProAmatine.   Filed Weights   03/24/19 0640 03/26/19 0504 03/27/19 0608  Weight: 87.5 kg 88.2 kg 88.3 kg   Stable on 3/30 10. 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 Hemoglobin A1c 7.8.   Lantus insulin 60 units twice a day, decreased to 30 BID on 3/17, increased to 45 twice daily on  3/18, increased to 50 on 3/20  Cont NovoLog 7 units 3 times a day.    Reglan decreased to 5 mg nightly on 3/26  Labile on 3/30 with variable intake  Monitor with increased mobility 11. Hyperlipidemia. Lipitor 12.  Supplemental oxygen dependent             Weaned supplemental oxygen  Encouraged IS 13. CAD  S/p NSTEMI  Digoxin level within normal limits on 3/24  Appreciate HF recs 14. Hyponatremia  Na 133 on 3/30  Cont to monitor 15. Hypokalemia: Resolved after supplementation 16. AKI  Cr 1.40 on 3/30  Daily BMP DC'd on 3/30  Encourage fluids  Cont to monitor 17. Leukocytosis: Resolved  Cont to monitor 18.  Borderline hypotension  Asymptomatic on 3/30  Recs per heart failure team  Continue monitor with increased  mobility  LOS: 14 days A FACE TO FACE EVALUATION WAS PERFORMED  Jasime Westergren Lorie Phenix 03/27/2019, 11:22 AM

## 2019-03-27 NOTE — Progress Notes (Signed)
Occupational Therapy Session Note  Patient Details  Name: [name] MRN: [id number] Date of Birth: Nov 08, 1951  Today's Date: 03/27/2019 OT Individual Time: 1000-1100 OT Individual Time Calculation (min): 60 min    Short Term Goals: Week 2:  OT Short Term Goal 1 (Week 2): Pt will complete toilet transfers min assist with slide board OT Short Term Goal 2 (Week 2): Pt will complete LB dressing min assist at sit > stand level OT Short Term Goal 3 (Week 2): Pt will complete hygiene with toileting with min assist and min cues for sequencing  Skilled Therapeutic Interventions/Progress Updates:    Treatment session with focus on hands on family education with pt and her daughter.  Pt received seated EOB with daughter present.  Pt completed dressing with setup assist for UB dressing and ability to thread BLE with encouragement.  Completed sit > stand with RW with min assist and therapist pulled pants over hips while pt maintained standing with CGA.  Educated pt and daughter about body positioning and body mechanics during sit >stand with pt's daughter able to return demonstration and completed sit <> stand with pt with min assist with RW.  Educated on board placement prior to slide board transfers with pt able to place board during session, daughter able to correct pt to increase safety and proper setup of board prior to transfer.  Pt's daughter observed slide board transfer on/off wide drop arm BSC with CGA and then daughter completed transfer to w/c via slide board in same manner with close supervision after setup of board.  Educated pt and daughter on limb wrapping, limb inspection, and desensitization strategies - provided with handout.  Educated on proper setup for transfer on/of tub bench in ADL tubroom with pt able to complete transfer with CGA after setup of slide board. Pt passed off to PT in hallway.  Pt reports dizziness during session, therefore did not repeat many transfers to allow  for increased participation during this session and PT session immediately following.  Pt's daughter demonstrated good body mechanics and proper placement of slide board prior to transfers.  Pt and daughter report comfortable with transfers and self-care tasks.  Therapy Documentation Precautions:  Precautions Precautions: Fall Precaution Comments: watch BP Restrictions Weight Bearing Restrictions: Yes RLE Weight Bearing: Non weight bearing Other Position/Activity Restrictions: Pt R NWB with new BKA Vital Signs: Therapy Vitals BP: (!) 94/52 Patient Position (if appropriate): Sitting Pain:  Pt with no c/o pain   Therapy/Group: Individual Therapy  Simonne Come 03/27/2019, 11:51 AM

## 2019-03-27 NOTE — Discharge Instructions (Signed)
Inpatient Rehab Discharge Instructions  [name] Discharge date and time: No discharge date for patient encounter.   Activities/Precautions/ Functional Status: Activity: activity as tolerated Diet: diabetic diet Wound Care: keep wound clean and dry Functional status:  ___ No restrictions     ___ Walk up steps independently ___ 24/7 supervision/assistance   ___ Walk up steps with assistance ___ Intermittent supervision/assistance  ___ Bathe/dress independently ___ Walk with walker     _x__ Bathe/dress with assistance ___ Walk Independently    ___ Shower independently ___ Walk with assistance    ___ Shower with assistance ___ No alcohol     ___ Return to work/school ________  Special Instructions:    COMMUNITY REFERRALS UPON DISCHARGE:    Home Health:   PT, OT, RN, [place]   Date of last service:03/30/2019  Medical Equipment/Items Ordered:WHEELCHAIR, WIDE DROP-ARM BEDSIDE COMMODE, [place]  Agency/Supplier:[facility]   [phone]   GENERAL COMMUNITY RESOURCES FOR PATIENT/FAMILY: Support Groups:[facility] THE SECOND Thursday OF EACH MONTH @ 7:00-8:30 PM ON THE HEART & VASCULAR UNIT ROOM 1H105 QUESTIONS CONTACT ROBIN [phone]   My questions have been answered and I understand these instructions. I will adhere to these goals and the provided educational materials after my discharge from the hospital.  Patient/Caregiver Signature _______________________________ Date __________  Clinician Signature _______________________________________ Date __________  Please bring this form and your medication list with you to all your follow-up doctor's appointments.

## 2019-03-27 NOTE — Progress Notes (Signed)
Social Work Patient ID: [name], female   DOB: 12/28/1951, 68 y.o.   MRN: [id number] Met with pt and daughter who is here for family education it went well and both feel comfortable with discharge 4/2. Discussed home health and equipment needs. Will work on discharge for Thursday.

## 2019-03-28 ENCOUNTER — Inpatient Hospital Stay (HOSPITAL_COMMUNITY): Payer: Medicare Other | Admitting: Occupational Therapy

## 2019-03-28 ENCOUNTER — Inpatient Hospital Stay (HOSPITAL_COMMUNITY): Payer: Medicare Other | Admitting: Physical Therapy

## 2019-03-28 DIAGNOSIS — K5903 Drug induced constipation: Secondary | ICD-10-CM

## 2019-03-28 LAB — GLUCOSE, CAPILLARY
Glucose-Capillary: 180 mg/dL — ABNORMAL HIGH (ref 70–99)
Glucose-Capillary: 210 mg/dL — ABNORMAL HIGH (ref 70–99)
Glucose-Capillary: 228 mg/dL — ABNORMAL HIGH (ref 70–99)
Glucose-Capillary: 276 mg/dL — ABNORMAL HIGH (ref 70–99)

## 2019-03-28 MED ORDER — INSULIN GLARGINE 100 UNIT/ML ~~LOC~~ SOLN
53.0000 [IU] | Freq: Two times a day (BID) | SUBCUTANEOUS | Status: DC
  Administered 2019-03-28 – 2019-03-30 (×4): 53 [IU] via SUBCUTANEOUS
  Filled 2019-03-28 (×6): qty 0.53

## 2019-03-28 NOTE — Progress Notes (Signed)
[place] PHYSICAL MEDICINE & REHABILITATION PROGRESS NOTE  Subjective/Complaints: Patient seen sitting up this morning.  She states she slept well overnight.  She states that she is constipated, and is willing to try medications today.  ROS: Denies CP, shortness of breath, nausea, vomiting, diarrhea.  Objective: Vital Signs: Blood pressure (!) 122/51, pulse 69, temperature 98.2 F (36.8 C), temperature source Oral, resp. rate 18, height 5\' 9"  (1.753 m), weight 86.7 kg, SpO2 96 %. No results found. No results for input(s): WBC, HGB, HCT, PLT in the last 72 hours. Recent Labs    03/26/19 0513 03/27/19 0600  NA 132* 133*  K 3.9 3.5  CL 93* 95*  CO2 27 24  GLUCOSE 154* 157*  BUN 23 24*  CREATININE 1.53* 1.40*  CALCIUM 9.1 8.9    Physical Exam: BP (!) 122/51 (BP Location: Left Arm)   Pulse 69   Temp 98.2 F (36.8 C) (Oral)   Resp 18   Ht 5\' 9"  (1.753 m)   Wt 86.7 kg   SpO2 96%   BMI 28.23 kg/m  Constitutional: No distress . Vital signs reviewed. HENT: Normocephalic.  Atraumatic. Eyes: EOMI. No discharge. Cardiovascular: RRR.  No JVD. Respiratory: CTA bilaterally.  Normal effort. GI: BS +. Non-distended. Musc: No edema or tenderness in extremities. Musculoskeletal: Right stump edema with tenderness, stable Neurological: She is alert and oriented x 3.  She follows full commands. Motor: Bilateral upper extremities: 4+/5 proximal distal Left lower extremity: 4+/5 proximal distal, stable Right lower extremity: Hip flexion 4+/5 (pain inhibition), stable Sensation diminished to light touch left foot Skin: Right stump with dressing C/D/I Stage II on patella at location of cast.  Psychiatric: Her affect is blunt.   Assessment/Plan: 1. 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  Physiatrist is providing close team supervision and  24 hour management of active medical problems listed below.  Physiatrist and rehab team continue to assess barriers to discharge/monitor patient progress toward functional and medical goals  Care Tool:  Bathing  Bathing activity did not occur: Refused Body parts bathed by patient: Right arm, Left arm, Chest, Abdomen, Face   Body parts bathed by helper: Front perineal area, Buttocks Body parts n/a: Right lower leg(Rt BKA)   Bathing assist Assist Level: Moderate Assistance - Patient 50 - 74%     Upper Body Dressing/Undressing Upper body dressing   What is the patient wearing?: Pull over shirt    Upper body assist Assist Level: Set up assist    Lower Body Dressing/Undressing Lower body dressing      What is the patient wearing?: Pants     Lower body assist Assist for lower body dressing: Minimal Assistance - Patient > 75%     Toileting Toileting Toileting Activity did not occur (Clothing management and hygiene only): Safety/medical concerns(pt used bedpan)  Toileting assist Assist for toileting: Moderate Assistance - Patient 50 - 74%     Transfers Chair/bed transfer  Transfers assist  Chair/bed transfer activity did not occur: Safety/medical concerns  Chair/bed transfer assist level: Minimal Assistance - Patient > 75%     Locomotion Ambulation   Ambulation assist   Ambulation activity did not occur: Safety/medical concerns          Walk 10 feet activity   Assist           Walk 50 feet activity   Assist  Walk 150 feet activity   Assist           Walk 10 feet on uneven surface  activity   Assist Walk 10 feet on uneven surfaces activity did not occur: Safety/medical concerns         Wheelchair     Assist Will patient use wheelchair at discharge?: Yes Type of Wheelchair: Manual    Wheelchair assist level: Supervision/Verbal cueing Max wheelchair distance: 50 ft    Wheelchair 50 feet with 2 turns  activity    Assist    Wheelchair 50 feet with 2 turns activity did not occur: Safety/medical concerns   Assist Level: Minimal Assistance - Patient > 75%   Wheelchair 150 feet activity     Assist Wheelchair 150 feet activity did not occur: Safety/medical concerns          Medical Problem List and Plan: 1.  Decreased functional mobility secondary to right BKA 43/15/4008 complicated by perioperative non-STEMI/septic shock             Continue CIR  Stump shrinker ordered, remains pending 2.  Antithrombotics: -DVT/anticoagulation:  Subcutaneous heparin             -antiplatelet therapy:  Aspirin 81 mg daily, Plavix 75 mg daily 3. Pain Management:  Neurontin 600 mg 3 times a day, decreased to 300 3 times daily on 3/26  Tramadol changed to 100 mg 3 times a day prn on 3/20, decreased to 50 on 3/24, increased back to 100  Robaxin added on 3/29  oxycodone reduced to 2.5mg  q 4 h  Continue to provide encouragement and coping mechanisms, appreciate neuropsych 4. Mood: Klonopin 0.5 mg twice a day as needed, reminded patient and encouraged to take, will consider scheduling if necessary             -antipsychotic agents: see above 5. Neuropsych: This patient is capable of making decisions on his own behalf. 6. Skin/Wound Care:  Routine skin checks  Wound care to stump 7. Fluids/Electrolytes/Nutrition: routine and out's  8. Acute blood loss anemia.   Hemoglobin 8.6 on 3/24  Continue to monitor 9. 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 Monitor for any signs of fluid overload. Lanoxin 0.125 mg daily, Aldactone 12.5 mg daily, Demadex 40 mg daily. Patient is off ProAmatine.   Filed Weights   03/26/19 0504 03/27/19 0608 03/28/19 0535  Weight: 88.2 kg 88.3 kg 86.7 kg   Stable on 3/31 10. 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 Hemoglobin A1c 7.8.   Lantus insulin 60 units twice a day, decreased to 30 BID on 3/17, increased to 45 twice  daily on 3/18, increased to 50 on 3/20, increased to 53 twice daily on 3/31  Cont NovoLog 7 units 3 times a day.    Reglan decreased to 5 mg nightly on 3/26  Monitor with increased mobility 11. Hyperlipidemia. Lipitor 12.  Supplemental oxygen dependent             Weaned supplemental oxygen  Encouraged IS 13. CAD  S/p NSTEMI  Digoxin level within normal limits on 3/24  Appreciate HF recs 14. Hyponatremia  Na 133 on 3/30  Cont to monitor 15. Hypokalemia: Resolved after supplementation 16. AKI  Cr 1.40 on 3/30  Daily BMP DC'd on 3/30  Encourage fluids  Cont to monitor 17. Leukocytosis: Resolved  Cont to monitor 18.  Borderline hypotension  Improving on 3/31  Recs per heart failure team  Continue monitor with increased mobility 19.  Drug-induced constipation  Patient willing to try meds now  LOS: 15 days A FACE TO FACE EVALUATION WAS PERFORMED  Ankit Lorie Phenix 03/28/2019, 9:32 AM

## 2019-03-28 NOTE — Plan of Care (Signed)
  Problem: Consults Goal: RH LIMB LOSS PATIENT EDUCATION Description Description: See Patient Education module for eduction specifics. Outcome: Progressing   Problem: RH BOWEL ELIMINATION Goal: RH STG MANAGE BOWEL WITH ASSISTANCE Description STG Manage Bowel with min Assistance.  Outcome: Progressing   Problem: RH BLADDER ELIMINATION Goal: RH STG MANAGE BLADDER WITH ASSISTANCE Description STG Manage Bladder With  Min Assistance  Outcome: Progressing   Problem: RH SKIN INTEGRITY Goal: RH STG SKIN FREE OF INFECTION/BREAKDOWN Description Min assist  Outcome: Progressing Goal: RH STG MAINTAIN SKIN INTEGRITY WITH ASSISTANCE Description STG Maintain Skin Integrity With min Assistance.  Outcome: Progressing Goal: RH STG ABLE TO PERFORM INCISION/WOUND CARE W/ASSISTANCE Description STG Able To Perform Incision/Wound Care With  [employer].  Outcome: Progressing   Problem: RH SAFETY Goal: RH STG ADHERE TO SAFETY PRECAUTIONS W/ASSISTANCE/DEVICE Description STG Adhere to Safety Precautions With  Min Assistance/Device.  Outcome: Progressing   Problem: RH PAIN MANAGEMENT Goal: RH STG PAIN MANAGED AT OR BELOW PT'S PAIN GOAL Description Manage pain at or below level 4  Outcome: Progressing   Problem: RH KNOWLEDGE DEFICIT LIMB LOSS Goal: RH STG INCREASE KNOWLEDGE OF SELF CARE AFTER LIMB LOSS Description  dtr to Manage care at discharge using handouts/resource notebook independently  Outcome: Progressing

## 2019-03-28 NOTE — Progress Notes (Signed)
Physical Therapy Session Note  Patient Details  Name: LINDE WILENSKY MRN: [id number] Date of Birth: [birth year]  Today's Date: 03/28/2019 PT Individual Time: 1015-1112 PT Individual Time Calculation (min): 57 min   Short Term Goals: Week 2:  PT Short Term Goal 1 (Week 2): = LTG  Skilled Therapeutic Interventions/Progress Updates:    pt in bed agreeable to PT. Pt c/o fatigue but no c/o pain.  Pt performs bed mobility with supervision.  Sliding board transfers throughout session with set up assist.  W/c mobility 65', 50' with supervision and encouragement.  Pt performs sit <> stand blocked practice with CGA with RW.  Standing balance for horseshoe toss 3 x 2 minutes with min A.  Seated therex with orange theraband HS curls, hip abd/add, SLR, hip add squeeze.  Ball tap with 2# dowel 3 x 2 minutes.  Pt then requests to use bathroom. Pt able to squat pivot to [facility] over toilet with use of grab bar and min A.  Supervision for standing balance with total A for clothing management. Pt left on toilet with nursing present, needs at hand.  Therapy Documentation Precautions:  Precautions Precautions: Fall Precaution Comments: watch BP Restrictions Weight Bearing Restrictions: Yes RLE Weight Bearing: Non weight bearing Other Position/Activity Restrictions: Pt R NWB with new BKA Pain: Pt with no c/o pain this session, rec'd meds prior to therapy   Therapy/Group: Individual Therapy  Rehana Uncapher 03/28/2019, 11:13 AM

## 2019-03-28 NOTE — Progress Notes (Signed)
Occupational Therapy Session Note  Patient Details  Name: [name] MRN: [id number] Date of Birth: 1951/02/25  Today's Date: 03/28/2019 OT Individual Time: 1655-3748 OT Individual Time Calculation (min): 44 min    Short Term Goals: Week 2:  OT Short Term Goal 1 (Week 2): Pt will complete toilet transfers min assist with slide board OT Short Term Goal 2 (Week 2): Pt will complete LB dressing min assist at sit > stand level OT Short Term Goal 3 (Week 2): Pt will complete hygiene with toileting with min assist and min cues for sequencing  Skilled Therapeutic Interventions/Progress Updates:    Pt reluctantly worked in therapy this session, but upon entering room she started setting up boundaries.  She stated that she was going home in two days and felt like she could already do all tasks she needed.  As therapist looked at her goals, it was noted that most goals had been downgraded based on pt's self limitations and this was discussed with the pt.  She stated "I'll work but I don't want to do any standing like we did the other day" and then began making excuses, "I'm tired, my arms are weak, I can't see".  She finally agreed to transfer out of the bed and complete some wheelchair mobility down to the [place] hallway.  Supervision for transfer to the wheelchair via sliding board.  Once in the wheelchair she was only able to push about 23' before stopping and stating she needed to rest.  Throughout 3 intervals of pushing, she was not able to push further than 50' max before resting.  She would then question if therapist was going to help push some.  Returned to room at end of session with pt needing max demonstrational cueing for wheelchair setup and supervision for setup of sliding board to transfer back to the bed at supervision level.  Pt left with call button and phone in reach.  Bed alarm in place as well.   Therapy Documentation Precautions:  Precautions Precautions: Fall Precaution  Comments: watch BP Restrictions Weight Bearing Restrictions: Yes RLE Weight Bearing: Non weight bearing Other Position/Activity Restrictions: Pt R NWB with new BKA   Pain: Pain Assessment Pain Scale: Faces Pain Score: 3  Faces Pain Scale: Hurts a little bit Pain Type: Acute pain Pain Location: Leg Pain Orientation: Right Pain Descriptors / Indicators: Throbbing Pain Frequency: Intermittent Pain Onset: On-going Pain Intervention(s): Medication (See eMAR)(tramadol) ADL: See Care Tool Section of chart for details  Therapy/Group: Individual Therapy  Hosea Hanawalt OTR/L 03/28/2019, 3:50 PM

## 2019-03-28 NOTE — Progress Notes (Signed)
Occupational Therapy Session Note  Patient Details  Name: [name] MRN: [id number] Date of Birth: 1951-11-03  Today's Date: 03/28/2019 OT Individual Time: 8185-6314 OT Individual Time Calculation (min): 60 min  and Today's Date: 03/28/2019 OT Missed Time: 15 Minutes Missed Time Reason: Patient fatigue   Short Term Goals: Week 2:  OT Short Term Goal 1 (Week 2): Pt will complete toilet transfers min assist with slide board OT Short Term Goal 2 (Week 2): Pt will complete LB dressing min assist at sit > stand level OT Short Term Goal 3 (Week 2): Pt will complete hygiene with toileting with min assist and min cues for sequencing  Skilled Therapeutic Interventions/Progress Updates:    Treatment session with focus on functional transfers, sit > stand, and endurance during self-care tasks.  Pt received seated edge of bed reporting need to toilet.  Donned pants with min assist sit > stand and assistance from therapist to pull pants over hips while pt maintained standing with min assist.  Pt declined attempting standing pivot transfer to [facility], therefore completed transfer with slide board with min assist after therapist placed board.  Sit > stand with min assist at toilet while therapist doffed clothing.  Pt attempted perineal hygiene post void, but unable to complete due to body habitus and hesitancy with weight shift.  Slide board transfer CGA BSC to w/c.  Completed oral care, washing face, and brushing hair while seated at sink.  Pt propelled w/c 25' x2 for BUE strengthening and endurance.  Pt requested to return to bed due to pain and BUE fatigue.  Slide board transfer CGA to bed.  Therapist re-wrapped residual limb in figure 8 wrapping, educated pt on technique and purpose of re-wrapping.  Pt asking questions about limb care and ace wrapping.  Verified with MD that pt should have shrinker for d/c.  Spoke with LPN to follow up regarding previously placed order for shrinker, notified pt.  Pt  left semi-reclined in bed with all needs in reach.  Therapy Documentation Precautions:  Precautions Precautions: Fall Precaution Comments: watch BP Restrictions Weight Bearing Restrictions: Yes RLE Weight Bearing: Non weight bearing Other Position/Activity Restrictions: Pt R NWB with new BKA General: General OT Amount of Missed Time: 15 Minutes Vital Signs: Therapy Vitals Temp: 98.2 F (36.8 C) Temp Source: Oral Pulse Rate: 69 Resp: 18 BP: (!) 122/51 Patient Position (if appropriate): Lying Oxygen Therapy SpO2: 96 % O2 Device: Room Air Pain: Pain Assessment Pain Scale: 0-10 Pain Score: 5  Pain Type: Acute pain Pain Location: Leg Pain Orientation: Right Pain Descriptors / Indicators: Throbbing Pain Frequency: Intermittent Pain Onset: On-going Patients Stated Pain Goal: 0 Pain Intervention(s): Medication (See eMAR)(tramadol)   Therapy/Group: Individual Therapy  Simonne Come 03/28/2019, 8:38 AM

## 2019-03-29 ENCOUNTER — Inpatient Hospital Stay (HOSPITAL_COMMUNITY): Payer: Medicare Other | Admitting: Occupational Therapy

## 2019-03-29 ENCOUNTER — Inpatient Hospital Stay (HOSPITAL_COMMUNITY): Payer: Medicare Other

## 2019-03-29 DIAGNOSIS — G8918 Other acute postprocedural pain: Secondary | ICD-10-CM

## 2019-03-29 LAB — GLUCOSE, CAPILLARY
Glucose-Capillary: 224 mg/dL — ABNORMAL HIGH (ref 70–99)
Glucose-Capillary: 226 mg/dL — ABNORMAL HIGH (ref 70–99)
Glucose-Capillary: 240 mg/dL — ABNORMAL HIGH (ref 70–99)
Glucose-Capillary: 239 mg/dL — ABNORMAL HIGH (ref 70–99)

## 2019-03-29 NOTE — Progress Notes (Signed)
Social Work Patient ID: [name], female   DOB: 10/23/1951, 68 y.o.   MRN: [id number] Met with pt who is looking forward to going home tomorrow. Daughter was in for education and both feels ready to go home. Equipment to be delivered today and home health to begin by Friday.  

## 2019-03-29 NOTE — Plan of Care (Signed)
  Problem: Consults Goal: RH LIMB LOSS PATIENT EDUCATION Description Description: See Patient Education module for eduction specifics. Outcome: Progressing   Problem: RH BOWEL ELIMINATION Goal: RH STG MANAGE BOWEL WITH ASSISTANCE Description STG Manage Bowel with min Assistance.  Outcome: Progressing Flowsheets (Taken 03/29/2019 1458) STG: Pt will manage bowels with assistance: 4-Minimum assistance   Problem: RH BLADDER ELIMINATION Goal: RH STG MANAGE BLADDER WITH ASSISTANCE Description STG Manage Bladder With  Min Assistance  Outcome: Progressing Flowsheets (Taken 03/29/2019 1458) STG: Pt will manage bladder with assistance: 4-Minimal assistance   Problem: RH SKIN INTEGRITY Goal: RH STG SKIN FREE OF INFECTION/BREAKDOWN Description Min assist  Outcome: Progressing Goal: RH STG MAINTAIN SKIN INTEGRITY WITH ASSISTANCE Description STG Maintain Skin Integrity With min Assistance.  Outcome: Progressing Flowsheets (Taken 03/29/2019 1458) STG: Maintain skin integrity with assistance: 4-Minimal assistance Goal: RH STG ABLE TO PERFORM INCISION/WOUND CARE W/ASSISTANCE Description STG Able To Perform Incision/Wound Care With  [employer].  Outcome: Progressing Flowsheets (Taken 03/29/2019 1458) STG: Pt will be able to perform incision/wound care with assistance: 4-Minimal assistance   Problem: RH SAFETY Goal: RH STG ADHERE TO SAFETY PRECAUTIONS W/ASSISTANCE/DEVICE Description STG Adhere to Safety Precautions With  Min Assistance/Device.  Outcome: Progressing Flowsheets (Taken 03/29/2019 1458) STG:Pt will adhere to safety precautions with assistance/device: 4-Minimal assistance   Problem: RH PAIN MANAGEMENT Goal: RH STG PAIN MANAGED AT OR BELOW PT'S PAIN GOAL Description Manage pain at or below level 4  Outcome: Progressing   Problem: RH KNOWLEDGE DEFICIT LIMB LOSS Goal: RH STG INCREASE KNOWLEDGE OF SELF CARE AFTER LIMB LOSS Description  dtr to Manage care at discharge using  handouts/resource notebook independently  Outcome: Progressing

## 2019-03-29 NOTE — Progress Notes (Signed)
Occupational Therapy Discharge Summary  Patient Details  Name: [name] MRN: [id number] Date of Birth: 1951/10/01  Patient has met 7 of 8 long term goals due to improved activity tolerance, postural control and ability to compensate for deficits.  Patient to discharge at [facility] Assist level.  Patient's care partner is independent to provide the necessary physical assistance at discharge.  Pt's daughter present for hands on family education 3/30 and demonstrated ability to provide cues for setup of slide board, encouragement for increased participation, and physical assistance during transfers and standing for LB dressing.    Reasons goals not met: Pt continues to require mod-max assist for toileting as she refuses to participate in clothing management at sit > stand level or with lateral leans.  Recommendation:  Patient will benefit from ongoing skilled OT services in home health setting to continue to advance functional skills in the area of BADL and Reduce care partner burden.  Equipment: tub bench and drop arm BSC  Reasons for discharge: treatment goals met and discharge from hospital  Patient/family agrees with progress made and goals achieved: Yes  OT Discharge Precautions/Restrictions  Precautions Precautions: Fall Precaution Comments: watch BP Restrictions Weight Bearing Restrictions: Yes RLE Weight Bearing: Non weight bearing Other Position/Activity Restrictions: Pt R NWB with new BKA Pain Pain Assessment Pain Scale: 0-10 Pain Score: 0-No pain ADL ADL Eating: Independent Grooming: Setup Where Assessed-Grooming: Sitting at sink Upper Body Bathing: Setup, Supervision/safety Where Assessed-Upper Body Bathing: Edge of bed Lower Body Bathing: Minimal assistance Where Assessed-Lower Body Bathing: Edge of bed Upper Body Dressing: Setup Where Assessed-Upper Body Dressing: Edge of bed Lower Body Dressing: Minimal assistance Where Assessed-Lower Body  Dressing: Edge of bed Toileting: Moderate assistance Where Assessed-Toileting: Bedside Commode Toilet Transfer: Contact guard, Minimal assistance Toilet Transfer Method: Stand pivot, Theatre manager: Extra wide drop arm bedside commode Tub/Shower Transfer: Metallurgist Method: Administrator, arts: Gaffer Baseline Vision/History: Retinopathy Patient Visual Report: No change from baseline Additional Comments: poor vision; unable to read unless large print Cognition Overall Cognitive Status: Within Functional Limits for tasks assessed Arousal/Alertness: Awake/alert Orientation Level: Oriented X4 Attention: Selective Selective Attention: Appears intact Memory: Appears intact Awareness: Impaired Problem Solving: Impaired Behaviors: Poor frustration tolerance Safety/Judgment: Impaired Sensation Sensation Light Touch: Impaired Detail Light Touch Impaired Details: Impaired RLE;Impaired LLE Proprioception: Appears Intact Coordination Gross Motor Movements are Fluid and Coordinated: Yes Fine Motor Movements are Fluid and Coordinated: Yes Motor  Motor Motor - Skilled Clinical Observations: generalized weakness Extremity/Trunk Assessment RUE Assessment RUE Assessment: Within Functional Limits LUE Assessment LUE Assessment: Within Functional Limits   Delanda Bulluck, [facility] 03/29/2019, 9:32 AM

## 2019-03-29 NOTE — Plan of Care (Signed)
Due to the current state of emergency, patients may not be receiving their 3-hours of Medicare-mandated therapy.   

## 2019-03-29 NOTE — Discharge Summary (Addendum)
Physician Discharge Summary  Patient ID: [name] MRN: [id number] DOB/AGE: [birth year] 69 y.o.  Admit date: 03/13/2019 Discharge date: 03/30/2019  Discharge Diagnoses:  Active Problems:   Right below-knee amputee (HCC)   AKI (acute kidney injury) ([place])   Diabetic osteomyelitis ([place])   Hypokalemia   Diabetic peripheral neuropathy (HCC)   Labile blood glucose   Hypoglycemia   Acute on chronic systolic heart failure (HCC)   Leucocytosis   Hyponatremia   Arterial hypotension   Diabetic gastroparesis (HCC)   Anxiety state   Drug induced constipation   Discharged Condition: stable  Significant Diagnostic Studies: Dg Chest Portable 2 Views (neonate)  Result Date: 03/06/2019 CLINICAL DATA:  Hypoxemia EXAM: CHEST  2 VIEW PORTABLE COMPARISON:  02/09/2019 FINDINGS: Mild cardiomegaly. No focal airspace disease or effusion. Aortic atherosclerosis. No pneumothorax. Surgical plate and hardware in the left humerus. IMPRESSION: No active cardiopulmonary disease.  Stable cardiomegaly. Electronically Signed   By: Donavan Foil M.D.   On: 03/06/2019 19:34   Dg Chest Port 1 View  Result Date: 03/11/2019 CLINICAL DATA:  ETT EXAM: PORTABLE CHEST 1 VIEW COMPARISON:  03/08/2019 FINDINGS: No endotracheal tube is visualized. Lungs are essentially clear.  No pleural effusion or pneumothorax. The heart is normal in size. Right IJ venous catheter terminates at the cavoatrial junction. IMPRESSION: No evidence of acute cardiopulmonary disease. Electronically Signed   By: Julian Hy M.D.   On: 03/11/2019 06:08   Dg Chest Port 1 View  Result Date: 03/08/2019 CLINICAL DATA:  Central line placement EXAM: PORTABLE CHEST 1 VIEW COMPARISON:  03/06/2019 FINDINGS: Right IJ approach central venous catheter tip is at the cavoatrial junction. There is mild cardiomegaly without pulmonary edema. No focal airspace consolidation. No pleural effusion or pneumothorax. IMPRESSION: Right IJ CVC tip at the  cavoatrial junction. Electronically Signed   By: Ulyses Jarred M.D.   On: 03/08/2019 14:51   [place] Ekg Site Rite  Result Date: 03/08/2019 If Site Rite image not attached, placement could not be confirmed due to current cardiac rhythm.   Labs:  Basic Metabolic Panel: Recent Labs  Lab 03/24/19 0527 03/25/19 0554 03/26/19 0513 03/27/19 0600  NA 131* 133* 132* 133*  K 4.3 4.3 3.9 3.5  CL 91* 92* 93* 95*  CO2 27 27 27 24   GLUCOSE 230* 139* 154* 157*  BUN 19 20 23  24*  CREATININE 1.42* 1.40* 1.53* 1.40*  CALCIUM 9.2 9.2 9.1 8.9  MG  --   --   --  1.8    CBC: No results for input(s): WBC, NEUTROABS, HGB, HCT, MCV, PLT in the last 168 hours.  CBG: Recent Labs  Lab 03/28/19 2104 03/29/19 0610 03/29/19 1153 03/29/19 1656 03/29/19 2113  GLUCAP 276* 224* 226* 240* 239*   Family history. Mother with diabetes hypertension as well as CVA. Brother with diabetes. Negative breast cancer  Brief HPI:    [name] is a 68 year old right handed female history of diabetes mellitus maintained on Lantus insulin, diastolic congestive heart failure maintained on Lasix 40 mg daily, CAD with stenting 2008 maintained on Plavix followed by cardiology services Dr. Chancy Milroy as well as history of panic attacks. Per chart review patient lives with daughter and assistance as needed one level home to steps to entry. Presented 03/06/2019 after recent fall 02/15/2019 sustaining a right ankle fracture followed by orthopedic services. She was initially placed in a splint as an outpatient after undergoing reduction in the emergency room and referred to orthopedic services. She had another  fall did not seek medical help noted bleeding from the splint after most recent fall and again did not pursue medical attention. On evaluation noted extremely foul odor and the tibia was exposed. 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 Mardelle Matte. Postoperative hypotension required  intubation maintained on pressors. Findings of elevated troponin cardiology services follow-up echocardiogram with ejection fraction 25% severely reduced systolic function no intervention required remain on aspirin and Plavix therapy as well as initially intravenous heparin. Patient did undergo gentle diurese is close monitoring of renal function creatinine 1.8. Follow-up critical care medicine for septic shock in the setting of right ankle infection. She was initially placed on ProAmatine for orthostasis. Acute blood loss anemia 7.8-8.2. Subcutaneous heparin for DVT prophylaxis. Therapy evaluations completed and patient was admitted for a comprehensive rehabilitation program.  Hospital Course: [name] was admitted to rehab 03/13/2019 for inpatient therapies to consist of PT, ST and OT at least three hours five days a week. Past admission physiatrist, therapy team and rehab RN have worked together to provide customized collaborative inpatient rehab. Pertaining to [name] right BKA 03/06/2019 postoperative dressing removed follow-up orthopedic services and stump shrinker was ordered. Patient remained afebrile. Subcutaneous heparin for DVT prophylaxis. Hospital course complicated by non-STEMI with septic shock continued on aspirin and Plavix therapy. Pain management use of Neurontin decreased to 300 mg 3 times daily on 03/23/2019. She was using tramadol and oxycodone for breakthrough pain. Mood stabilization with history of panic attacks Klonopin was used as needed. Acute blood loss anemia 8.6 asymptomatic. Acute on chronic systolic congestive heart failure with orthostasis as she continued with Lanoxin daily as well as Cozaar 12.5 mg daily at bedtime Toprol 12.5 mg daily with ongoing use of Aldactone 25 mg daily and Demadex. She exhibited no other signs of fluid overload with follow-up with cardiology services.Patient had been weaned from her ProAmatine. Blood sugars remained fairly controlled  hemoglobin A1c 7.8 Lantus insulin adjusted accordingly NovoLog insulin 3 times a day for meals pole diabetic teaching. She continued on Lipitor for hyperlipidemia. Drug-induced constipation bowel program established no nausea vomiting.  Physical exam. Blood pressure 12/31/1956 pulse 79 temp 98 respirations 18 oxygen saturations 96% room air. Constitutional. Well-developed well-nourished HEENT Head. Normocephalic and atraumatic Eyes. EOMs normal right eye and left eye exhibits no discharge no nystagmus Pupils reactive to light Neck. Normal range of motion supple nontender no JVD without bruit Cardiovascular. Normal rate and rhythm without murmur or rub Respiratory. Effort normal breath sounds normal no wheeze Musculoskeletal Comments. Right stump edema appropriately tender Neurological. She is alert follow commands bilateral upper shoulders 4 minus out of 5 proximal to distal F lower extremity 4 out of 5 proximal to distal right lower extremity hip flexion 3 out of 5 pain inhibition sensation diminished to light touch.  Rehab course: During patient's stay in rehab weekly team conferences were held to monitor patient's progress, set goals and discuss barriers to discharge. At admission, patient required +2 physical assist sit to stand, moderate assist supine to sit, moderate assist sit to supine, +2 physical assist lateral scoot transfers. Moderate assist upper body bathing, moderate assist upper body dressing. Max assist lower body bathing and dressing. Total assist for hygiene  He/She  has had improvement in activity tolerance, balance, postural control as well as ability to compensate for deficits. He/She has had improvement in functional use RUE/LUE  and RLE/LLE as well as improvement in awareness working with energy conservation techniques. Patient performed  bed mobility with supervision. Sliding board transfers throughout session with simple set up assistance. She could propel her wheelchair 65  feet supervision needing some encouragement at times. Perform sit to stand blocked practice with contact-guard assist using rolling walker. Standing balance for horseshoe toss with minimal assistance. Patient able to squat pivot to bedside commode over toilet with use of a grab bar minimal assistance. Supervision for standing balance with total assist for clothing management. She could don her pants with minimal assist sit to stand and assistance from therapist to pull pants over hips. Again she did need some assistance at times to participate. Completed oral care washing face brushing hair while seated at sink. Full family teaching was completed and plan discharge to home.       Disposition: Discharge disposition: 01-Home or Self Care      discharged to home   Diet: diabetic diet  Special Instructions:  no driving  Medications at discharge. 1. Tylenol as needed 2. Aspirin 81 mg by mouth daily 3. Lipitor 40 mg by mouth daily at bedtime 4. Vitamin D 2000 units daily 5. Klonopin 0.5 mg by mouth twice a day as needed anxiety 6. Plavix 75 mg by mouth daily 7. Lanoxin 0.125 mg by mouth daily 8. Neurontin 300 mg by mouth 3 times a day 9. NovoLog 7 units 3 times a day with meals 10. Lantus insulin 53 units subcutaneous twice a day 11. Cozaar 12.5 mg by mouth daily at bedtime 12. Robaxin 500 mg bedtime 13. Reglan 5 mg by mouth daily at bedtime 14 Toprol-XL 12.5 mg by mouth daily at bedtime 15. Oxycodone 2.5 mg by mouth every 4 hours as needed pain 16. Protonix 40 mg by mouth daily 17. MiraLAX twice daily hold for loose stools 18. Aldactone 25 mg by mouth daily 19. Demadex 400 mg by mouth daily 20. Ultram 100 mg 3 times a day 2 weeks  Discharge Instructions    Ambulatory referral to Physical Medicine Rehab   Complete by:  As directed    Moderate complexity follow-up 1-2 weeks right BKA      Follow-up Information    Jamse Arn, MD Follow up.   Specialty:  Physical  Medicine and Rehabilitation Why:  office to call for appointment Contact information: [address] [place] [address] [phone]        Marchia Bond, MD Follow up.   Specialty:  Orthopedic Surgery Why:  call for appointment 2 weeks Contact information: [place] [address] [phone]        Larey Dresser, MD Follow up.   Specialty:  Cardiology Why:  call for appointment Contact information: [address] [place] [address] [phone]        Perrin Maltese, MD Follow up.   Specialty:  Internal Medicine Contact information: [place] [address] [phone]           Signed: Cathlyn Parsons 03/30/2019, 5:57 AM Patient seen and examined by me on day of discharge. Delice Lesch, MD, ABPMR

## 2019-03-29 NOTE — Progress Notes (Signed)
Physical Therapy Session Note  Patient Details  Name: [name] MRN: [id number] Date of Birth: [birth year]  Today's Date: 03/29/2019 PT Individual Time: 0900-1010 PT Individual Time Calculation (min): 70 min   Short Term Goals: Week 2:  PT Short Term Goal 1 (Week 2): = LTG  Skilled Therapeutic Interventions/Progress Updates:    Pt seated in w/c upon PT arrival, agreeable to therapy tx and denies pain. Pt propelled w/c to the gym 2 x 50 ft with supervision using B UEs and cues for w/c parts management. Pt refusing to perform car transfer this session, reports "I've already done it and I am not doing it again." Pt performed slideboard transfer to the mat with supervision and cues for techniques. Pt performed x 2 sit<>stands this session from mat with RW and supervision. Pt worked on dynamic standing balance this session with RW to perform horseshoes toss x 2 trials, supervision. Pt performed LE therex while seated edge of mat for strengthening, 2 x10 : LAQ, hamstring curls with resistance, hip abduction with resistance. Pt performed UE therex for strengthening 2 x 10 each: 3# bicep culrs, 3# shoulder press, shoulder abduction, 3# dowel chest press. Pt transferred back to w/c with supervision and slideboard, transported back to room. Pt performed slideboard transfer back to bed with supervision and performed bed mobility with supervision. Pt left supine in bed with needs in reach and bed alarm set.   Therapy Documentation Precautions:  Precautions Precautions: Fall Precaution Comments: watch BP Restrictions Weight Bearing Restrictions: Yes RLE Weight Bearing: Non weight bearing Other Position/Activity Restrictions: Pt R NWB with new BKA    Therapy/Group: Individual Therapy  Netta Corrigan, PT, DPT 03/29/2019, 7:49 AM

## 2019-03-29 NOTE — Patient Care Conference (Signed)
Inpatient RehabilitationTeam Conference and Plan of Care Update Date: 03/29/2019   Time: 10:12 AM    Patient Name: [name]      Medical Record Number: [id number]  Date of Birth: 06/15/1951 Sex: Female         Room/Bed: 4M13C/4M13C-01 Payor Info: Payor: MEDICARE / Plan: MEDICARE PART A AND B / Product Type: *No Product type* /    Admitting Diagnosis: r bka  Admit Date/Time:  03/13/2019  5:11 PM Admission Comments: No comment available   Primary Diagnosis:  <principal problem not specified> Principal Problem: <principal problem not specified>  Patient Active Problem List   Diagnosis Date Noted  . Drug induced constipation   . Anxiety state   . Diabetic gastroparesis ([place])   . Arterial hypotension   . Hyponatremia   . Leucocytosis   . AKI (acute kidney injury) ([place])   . Diabetic osteomyelitis ([place])   . Hypokalemia   . Diabetic peripheral neuropathy ([place])   . Labile blood glucose   . Hypoglycemia   . Acute on chronic systolic heart failure ([place])   . Right below-knee amputee ([place]) 03/13/2019  . Supplemental oxygen dependent   . Postoperative pain   . Acute blood loss anemia   . Acute on chronic systolic (congestive) heart failure ([place])   . Orthostasis   . Diabetes mellitus type 2 in obese ([place])   . Dyslipidemia   . Non-STEMI (non-ST elevated myocardial infarction) ([place])   . Encounter for central line placement   . Below-knee amputation of right lower extremity ([place]) 03/06/2019  . S/P below knee amputation, right ([place]) 03/06/2019  . Hypoxemia   . Shock circulatory ([place])   . Acute pain   . Acute encephalopathy   . Gastroparesis due to DM ([place]) 04/21/2016  . DM type 2 with diabetic peripheral neuropathy ([place]) 03/12/2016  . 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 Peripheral polyneuropathy 11/14/2014  . Fracture of humerus, proximal, left, closed 02/23/2014  . Proximal humerus fracture 02/23/2014    Expected Discharge Date: Expected Discharge Date:  03/30/19  Team Members Present: Physician leading conference: Dr. Delice Lesch Social Worker Present: Ovidio Kin, LCSW Nurse Present: Blair Heys, RN PT Present: Other (comment);Georjean Mode, PT([place]-PT) OT Present: Roanna Epley, COTA SLP Present: Windell Moulding, SLP PPS Coordinator present : Ileana Ladd, PT     Current Status/Progress Goal Weekly Team Focus  Medical   Decreased functional mobility secondary to right BKA 29/92/4268 complicated by perioperative non-STEMI/septic shock  Improve mobility, transfers, endurance  See above   Bowel/Bladder   Pt is continent B/B. LBM 03/28/2019.  Maintain regular bowel pattern.  Assist with toileting needs PRN.   Swallow/Nutrition/ Hydration             ADL's   min assist/CGA transfers with slide board, min assist LB bathing/dressing, mod assist toileting.  Have completed family education with daughter, both report and demonstrate safety with mobility and LB dressing  Min assist toilet transfers and toileting, min assist LB bathing and dressing, setup grooming and UB dressing  ADL retraining, activity tolerance, transfers, sit > stand, education on care for residual limb, d/c planning   Mobility   set up/CGA sliding board transfers and stand pivot transfer to car  min A overall  d/c planning, endurance   Communication             Safety/Cognition/ Behavioral Observations            Pain   Pt complains of pain of  5 to RLE at BKA site. Pain managed with scheduled Tramadol.  Pain < 3  Assess pain Q shift and PRN.   Skin   Pt has staples to R BKA site, ecchymosis to abdomen and arms bilat, and MASD to groin and buttocks.  Treat skin issues per orders.  Assess skin Q shift and PRN.       *See Care Plan and progress notes for long and short-term goals.     Barriers to Discharge  Current Status/Progress Possible Resolutions Date Resolved   Physician    Medical stability;Wound Care;Weight bearing restrictions     See above  Therapies,  optimize DM meds, encourage fluids, follow labs      Nursing                  PT                    OT                  SLP                SW                Discharge Planning/Teaching Needs:  Daughter has been in for family education it went well and ready for DC tomorrow. Daughter aware pt will need 24 hr care at home      Team Discussion:  Progressing toward her goals and ready for DC tomorrow. Family education Monday went well with daughter. Medically stable for DC and BP and BS better. Diabetes need to be adjusted as an OP  Revisions to Treatment Plan:  DC 4/2    Continued Need for Acute Rehabilitation Level of Care: The patient requires daily medical management by a physician with specialized training in physical medicine and rehabilitation for the following conditions: Daily direction of a multidisciplinary physical rehabilitation program to ensure safe treatment while eliciting the highest outcome that is of practical value to the patient.: Yes Daily medical management of patient stability for increased activity during participation in an intensive rehabilitation regime.: Yes Daily analysis of laboratory values and/or radiology reports with any subsequent need for medication adjustment of medical intervention for : Post surgical problems;Diabetes problems;Wound care problems   I attest that I was present, lead the team conference, and concur with the assessment and plan of the team. Teleconference performed due to COVID-19   Adah Stoneberg, [facility] 03/29/2019, 10:12 AM

## 2019-03-29 NOTE — Progress Notes (Signed)
Physical Therapy Session Note  Patient Details  Name: [name] MRN: [id number] Date of Birth: [birth year]  Today's Date: 03/29/2019 PT Individual Time: 1300-1415 PT Individual Time Calculation (min): 75 min   Short Term Goals: Week 3:     Skilled Therapeutic Interventions/Progress Updates:    Patient in w/c and agreeable to PT.  Propelled w/c about 33' with one rest break with S.  Assisted to ortho gym for car transfer performed to midsize SUV height (22") with RW and stand pivot to seat min A mod cues for safety.  Able to get legs in on her own.  SBT to mat with S, pt sit to sidelying with S.  Performed LE therex R hip abduction, hip extension, seated LAQ w/ 5 sec hold all x 10 each.  Side to sit with S and increased time.  Patient SBT to R to w/c with cues with S.  Propelled w/c 56' with S.  Assisted to dayroom and pt performed UBE level 1 5 x 1 minute bouts for endurance traning.  Assisted in w/c back to room and transfer to bed via SBT with S.  Left in supine with call bell in reach, bed alarm on.  Therapy Documentation Precautions:  Precautions Precautions: Fall Precaution Comments: watch BP Restrictions Weight Bearing Restrictions: Yes RLE Weight Bearing: Non weight bearing Other Position/Activity Restrictions: Pt R NWB with new BKA Pain: Pain Assessment Pain Score: 0-No pain    Therapy/Group: Individual Therapy  Reginia Naas  Magda Kiel, PT 03/29/2019, 5:41 PM

## 2019-03-29 NOTE — Progress Notes (Signed)
[place] PHYSICAL MEDICINE & REHABILITATION PROGRESS NOTE  Subjective/Complaints: Patient working with therapies this morning.  She states she slept well overnight.  She has questions regarding removal of sutures.  She is looking for going home tomorrow.  ROS: Denies CP, shortness of breath, nausea, vomiting, diarrhea.  Objective: Vital Signs: Blood pressure (!) 105/54, pulse 75, temperature 98.1 F (36.7 C), temperature source Oral, resp. rate 18, height 5\' 9"  (1.753 m), weight 86.7 kg, SpO2 96 %. No results found. No results for input(s): WBC, HGB, HCT, PLT in the last 72 hours. Recent Labs    03/27/19 0600  NA 133*  K 3.5  CL 95*  CO2 24  GLUCOSE 157*  BUN 24*  CREATININE 1.40*  CALCIUM 8.9    Physical Exam: BP (!) 105/54 (BP Location: Left Arm)   Pulse 75   Temp 98.1 F (36.7 C) (Oral)   Resp 18   Ht 5\' 9"  (1.753 m)   Wt 86.7 kg   SpO2 96%   BMI 28.23 kg/m  Constitutional: No distress . Vital signs reviewed. HENT: Normocephalic.  Atraumatic. Eyes: EOMI. No discharge. Cardiovascular: RRR.  No JVD. Respiratory: CTA bilaterally.  Normal effort. GI: BS +. Non-distended. Musc: No edema or tenderness in extremities. Musculoskeletal: Right stump edema with tenderness, stable Neurological: She is alert and oriented x3.  She follows full commands. Motor: Bilateral upper extremities: 4+/5 proximal distal Left lower extremity: 4+/5 proximal distal, unchanged Right lower extremity: Hip flexion 4+/5 (pain inhibition), unchanged Sensation diminished to light touch left foot Skin: Right stump with dressing C/D/I Stage II on patella at location of cast, healing.  Psychiatric: Her affect is blunt.   Assessment/Plan: 1. 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  Physiatrist is providing close team supervision and 24 hour  management of active medical problems listed below.  Physiatrist and rehab team continue to assess barriers to discharge/monitor patient progress toward functional and medical goals  Care Tool:  Bathing  Bathing activity did not occur: Refused Body parts bathed by patient: Right arm, Left arm, Chest, Abdomen, Face, Right upper leg, Left upper leg   Body parts bathed by helper: Front perineal area, Buttocks Body parts n/a: Right lower leg(Rt BKA)   Bathing assist Assist Level: Minimal Assistance - Patient > 75%     Upper Body Dressing/Undressing Upper body dressing   What is the patient wearing?: Pull over shirt    Upper body assist Assist Level: Set up assist    Lower Body Dressing/Undressing Lower body dressing      What is the patient wearing?: Pants     Lower body assist Assist for lower body dressing: Minimal Assistance - Patient > 75%     Toileting Toileting Toileting Activity did not occur (Clothing management and hygiene only): Safety/medical concerns(pt used bedpan)  Toileting assist Assist for toileting: Moderate Assistance - Patient 50 - 74%     Transfers Chair/bed transfer  Transfers assist  Chair/bed transfer activity did not occur: Safety/medical concerns  Chair/bed transfer assist level: Supervision/Verbal cueing     Locomotion Ambulation   Ambulation assist   Ambulation activity did not occur: Safety/medical concerns          Walk 10 feet activity   Assist  Walk 10 feet activity did not occur: Safety/medical concerns        Walk 50 feet activity   Assist Walk  50 feet with 2 turns activity did not occur: Safety/medical concerns         Walk 150 feet activity   Assist Walk 150 feet activity did not occur: Safety/medical concerns         Walk 10 feet on uneven surface  activity   Assist Walk 10 feet on uneven surfaces activity did not occur: Safety/medical concerns         Wheelchair     Assist Will  patient use wheelchair at discharge?: Yes Type of Wheelchair: Manual    Wheelchair assist level: Supervision/Verbal cueing Max wheelchair distance: 50 ft    Wheelchair 50 feet with 2 turns activity    Assist    Wheelchair 50 feet with 2 turns activity did not occur: Safety/medical concerns   Assist Level: Supervision/Verbal cueing   Wheelchair 150 feet activity     Assist Wheelchair 150 feet activity did not occur: Safety/medical concerns(fatigue/weakness)          Medical Problem List and Plan: 1.  Decreased functional mobility secondary to right BKA 32/20/2542 complicated by perioperative non-STEMI/septic shock             Continue CIR  Stump shrinker   Team conference today to discuss current and goals and coordination of care, home and environmental barriers, and discharge planning with nursing, case manager, and therapies.   Plan for d/c tomorrow  Will see patient for transitional care management in 1-2 weeks post-discharge 2.  Antithrombotics: -DVT/anticoagulation:  Subcutaneous heparin             -antiplatelet therapy:  Aspirin 81 mg daily, Plavix 75 mg daily 3. Pain Management:  Neurontin 600 mg 3 times a day, decreased to 300 3 times daily on 3/26  Tramadol changed to 100 mg 3 times a day prn on 3/20, decreased to 50 on 3/24, increased back to 100  Robaxin added on 3/29  oxycodone reduced to 2.5mg  q 4 h  Controlled on 4/1  Continue to provide encouragement and coping mechanisms, appreciate neuropsych 4. Mood: Klonopin 0.5 mg twice a day as needed-anxiety improving             -antipsychotic agents: see above 5. Neuropsych: This patient is capable of making decisions on his own behalf. 6. Skin/Wound Care:  Routine skin checks  Wound care to stump 7. Fluids/Electrolytes/Nutrition: routine and out's  8. Acute blood loss anemia.   Hemoglobin 8.6 on 3/24  Continue to monitor 9. 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 Monitor  for any signs of fluid overload. Lanoxin 0.125 mg daily, Aldactone 12.5 mg daily, Demadex 40 mg daily. Patient is off ProAmatine.   Filed Weights   03/27/19 0608 03/28/19 0535 03/29/19 0500  Weight: 88.3 kg 86.7 kg 86.7 kg   Stable on 3/31 10. 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 Hemoglobin A1c 7.8.   Lantus insulin 60 units twice a day, decreased to 30 BID on 3/17, increased to 45 twice daily on 3/18, increased to 50 on 3/20, increased to 53 twice daily on 3/31  Cont NovoLog 7 units 3 times a day.    Reglan decreased to 5 mg nightly on 3/26  Remains elevated, will continue to require further adjustments in ambulatory setting  Monitor with increased mobility 11. Hyperlipidemia. Lipitor 12.  Supplemental oxygen dependent             Weaned supplemental oxygen  Encouraged IS 13. CAD  S/p NSTEMI  Digoxin level within normal limits  on 3/24  Appreciate HF recs 14. Hyponatremia  Na 133 on 3/30  Cont to monitor 15. Hypokalemia: Resolved after supplementation 16. AKI  Cr 1.40 on 3/30  Daily BMP DC'd on 3/30  Encourage fluids  Cont to monitor 17. Leukocytosis: Resolved  Cont to monitor 18.  Borderline hypotension  Improving overall on 4/1  Recs per heart failure team  Continue monitor with increased mobility 19.  Drug-induced constipation  Improving with meds  LOS: 16 days A FACE TO FACE EVALUATION WAS PERFORMED   Lorie Phenix 03/29/2019, 12:34 PM

## 2019-03-29 NOTE — Progress Notes (Signed)
Occupational Therapy Session Note  Patient Details  Name: [name] MRN: [id number] Date of Birth: 04/05/1951  Today's Date: 03/29/2019 OT Individual Time: 7001-7494 OT Individual Time Calculation (min): 72 min    Short Term Goals: Week 2:  OT Short Term Goal 1 (Week 2): Pt will complete toilet transfers min assist with slide board OT Short Term Goal 2 (Week 2): Pt will complete LB dressing min assist at sit > stand level OT Short Term Goal 3 (Week 2): Pt will complete hygiene with toileting with min assist and min cues for sequencing  Skilled Therapeutic Interventions/Progress Updates:    Completed ADL retraining at overall Min assist for LB bathing, dressing, and toilet transfers.  Pt completed bathing and dressing seated EOB with setup for items and CGA for standing balance while providing assistance with pulling pants over hips.  Pt reports need to toilet.  Completed slide board transfer bed > w/c with setup assist for placement of slide board followed by supervision for transfer.  Pt completed stand pivot transfer to toilet with CGA.  CGA for standing balance while therapist assisted with clothing management before and after toileting.  Grooming tasks completed in sitting at sink with setup assist to locate all items.  Completed tub/shower transfer in ADL tubroom with pt completing transfer with CGA and setup for slide board placement.  Pt propelled w/c 25' back to room.  Therapist re-wrapped residual limb in figure 8 pattern while educating on proper fit and need for inspection of residual limb.  Re-educated on limb inspection and desensitization strategies with reiteration to handout in notebook.  Referenced First Step handout and other handouts in notebook to reference for care and f/u.  Pt reports no further questions, pleased with progress and ready for d/c.  Therapy Documentation Precautions:  Precautions Precautions: Fall Precaution Comments: watch  BP Restrictions Weight Bearing Restrictions: Yes RLE Weight Bearing: Non weight bearing Other Position/Activity Restrictions: Pt R NWB with new BKA General:   Vital Signs: Therapy Vitals Temp: 98.1 F (36.7 C) Temp Source: Oral Pulse Rate: 75 Resp: 18 BP: (!) 105/54 Patient Position (if appropriate): Lying Oxygen Therapy SpO2: 96 % O2 Device: Room Air Pain:   ADL:   Vision   Perception    Praxis   Exercises:   Other Treatments:     Therapy/Group: Individual Therapy  Simonne Come 03/29/2019, 8:36 AM

## 2019-03-30 LAB — GLUCOSE, CAPILLARY: Glucose-Capillary: 225 mg/dL — ABNORMAL HIGH (ref 70–99)

## 2019-03-30 MED ORDER — ASPIRIN 81 MG PO TBEC: 81.0000 mg | DELAYED_RELEASE_TABLET | Freq: Every day | ORAL | Status: AC

## 2019-03-30 MED ORDER — ATORVASTATIN CALCIUM 40 MG PO TABS: 40.0000 mg | ORAL_TABLET | Freq: Every day | ORAL | 0 refills | Status: AC

## 2019-03-30 MED ORDER — POLYETHYLENE GLYCOL 3350 17 G PO PACK: 17.0000 g | PACK | Freq: Two times a day (BID) | ORAL | 0 refills | Status: DC

## 2019-03-30 MED ORDER — METOCLOPRAMIDE HCL 5 MG PO TABS: 5.0000 mg | ORAL_TABLET | Freq: Every day | ORAL | 0 refills | Status: AC

## 2019-03-30 MED ORDER — DIGOXIN 125 MCG PO TABS: 0.1250 mg | ORAL_TABLET | Freq: Every day | ORAL | 1 refills | Status: DC

## 2019-03-30 MED ORDER — SPIRONOLACTONE 25 MG PO TABS: 25.0000 mg | ORAL_TABLET | Freq: Every day | ORAL | 1 refills | Status: DC

## 2019-03-30 MED ORDER — TRAMADOL HCL 50 MG PO TABS: 100.0000 mg | ORAL_TABLET | Freq: Three times a day (TID) | ORAL | 0 refills | Status: DC

## 2019-03-30 MED ORDER — OXYCODONE HCL 5 MG PO TABS: 2.5000 mg | ORAL_TABLET | ORAL | 0 refills | Status: DC | PRN

## 2019-03-30 MED ORDER — TORSEMIDE 20 MG PO TABS: 40.0000 mg | ORAL_TABLET | Freq: Every day | ORAL | 1 refills | Status: DC

## 2019-03-30 MED ORDER — METOPROLOL SUCCINATE ER 25 MG PO TB24: 12.5000 mg | ORAL_TABLET | Freq: Every day | ORAL | 1 refills | Status: DC

## 2019-03-30 MED ORDER — GABAPENTIN 300 MG PO CAPS: 300.0000 mg | ORAL_CAPSULE | Freq: Three times a day (TID) | ORAL | 1 refills | Status: DC

## 2019-03-30 MED ORDER — PANTOPRAZOLE SODIUM 40 MG PO TBEC: 40.0000 mg | DELAYED_RELEASE_TABLET | Freq: Every day | ORAL | 0 refills | Status: AC

## 2019-03-30 MED ORDER — CLONAZEPAM 0.5 MG PO TABS: 0.5000 mg | ORAL_TABLET | Freq: Every day | ORAL | 0 refills | Status: DC | PRN

## 2019-03-30 MED ORDER — LOSARTAN POTASSIUM 25 MG PO TABS: 12.5000 mg | ORAL_TABLET | Freq: Every day | ORAL | 1 refills | Status: DC

## 2019-03-30 MED ORDER — INSULIN GLARGINE 100 UNIT/ML SOLOSTAR PEN: 53.0000 [IU] | PEN_INJECTOR | Freq: Two times a day (BID) | SUBCUTANEOUS | 11 refills | Status: AC

## 2019-03-30 MED ORDER — INSULIN ASPART 100 UNIT/ML FLEXPEN: 7.0000 [IU] | PEN_INJECTOR | Freq: Three times a day (TID) | SUBCUTANEOUS | 11 refills | Status: AC

## 2019-03-30 MED ORDER — VITAMIN D 50 MCG (2000 UT) PO CAPS: 2000.0000 [IU] | ORAL_CAPSULE | Freq: Every day | ORAL | 0 refills | Status: AC

## 2019-03-30 MED ORDER — CLOPIDOGREL BISULFATE 75 MG PO TABS: 75.0000 mg | ORAL_TABLET | Freq: Every day | ORAL | 0 refills | Status: AC

## 2019-03-30 MED ORDER — METHOCARBAMOL 500 MG PO TABS: 500.0000 mg | ORAL_TABLET | Freq: Every day | ORAL | 0 refills | Status: DC

## 2019-03-30 MED ORDER — ACETAMINOPHEN 325 MG PO TABS: 650.0000 mg | ORAL_TABLET | Freq: Four times a day (QID) | ORAL | Status: DC | PRN

## 2019-03-30 NOTE — Progress Notes (Signed)
Physical Therapy Discharge Summary  Patient Details  Name: [name] MRN: [id number] Date of Birth: August 15, 1951   Patient has met 6 of 6 long term goals due to improved activity tolerance, improved balance, increased strength, decreased pain and ability to compensate for deficits.  Patient to discharge at a wheelchair level Index.   Patient's care partner is independent to provide the necessary physical assistance at discharge.  Reasons goals not met: n/a  Recommendation:  Patient will benefit from ongoing skilled PT services in home health setting to continue to advance safe functional mobility, address ongoing impairments in strength, balance, activity tolerance, and minimize fall risk.  Equipment: w/c, sliding board  Reasons for discharge: treatment goals met and discharge from hospital  Patient/family agrees with progress made and goals achieved: Yes  PT Discharge Precautions/Restrictions Precautions Precautions: Fall Restrictions Other Position/Activity Restrictions: Pt R NWB with new BKA Vital Signs Therapy Vitals Temp: 98.2 F (36.8 C) Temp Source: Oral Pulse Rate: 70 Resp: 16 BP: (!) 108/44 Patient Position (if appropriate): Lying Oxygen Therapy SpO2: 93 % O2 Device: Room Air  Cognition Overall Cognitive Status: Within Functional Limits for tasks assessed Arousal/Alertness: Awake/alert Sensation Sensation Light Touch Impaired Details: Impaired RLE;Impaired LLE Proprioception: Appears Intact Motor  Motor Motor - Skilled Clinical Observations: generalized weakness  Mobility Bed Mobility Rolling Right: Supervision/verbal cueing Rolling Left: Supervision/Verbal cueing Supine to Sit: Supervision/Verbal cueing Transfers Sit to Stand: Minimal Assistance - Patient > 75% Locomotion  Gait Ambulation: No Gait Gait: No Stairs / Additional Locomotion Stairs: No Wheelchair Mobility Wheelchair Assistance: Theme park manager: Both upper extremities Wheelchair Parts Management: Needs assistance  Trunk/Postural Assessment  Cervical Assessment Cervical Assessment: Within Functional Limits Thoracic Assessment Thoracic Assessment: Within Functional Limits Lumbar Assessment Lumbar Assessment: (posterior pelvic tilt) Postural Control Righting Reactions: delayed  Balance Dynamic Sitting Balance Sitting balance - Comments: supervision sitting EOB Static Standing Balance Static Standing - Comment/# of Minutes: min A with RW Extremity Assessment      RLE Assessment General Strength Comments: grossly 3/5 LLE Assessment General Strength Comments: grossly 3/5    Shalandra Whittle 03/30/2019, 8:18 AM

## 2019-03-30 NOTE — Progress Notes (Signed)
Patient voices understanding of discharge instructions given by Linna Hoff, [place] and staff  Patients denies any distress at this time. Patient discharge via w/c with staff by side.

## 2019-03-30 NOTE — Progress Notes (Signed)
Social Work  Discharge Note  The overall goal for the admission was met for:   Discharge location: Yes-HOME WITH DAUGHTER WHO CAN PROVIDE 24 HR CARE  Length of Stay: Yes-17 DAYS  Discharge activity level: Yes-SUPERVISION-MIN ASSIST WHEELCHAIR LEVEL  Home/community participation: Yes  Services provided included: MD, RD, PT, OT, RN, CM, TR, Pharmacy, Neuropsych and SW  Financial Services: Medicare and Private Insurance: TR[employer]  Follow-up services arranged: Home Health: [place], DME: [facility]-WHEEL[facility], WIDE DROP-ARM BEDSIDE COMMODE, 30 TRANSFER BOARD AND TUB BENCH and Patient/Family has no preference for HH/DME agencies  Comments (or additional information):DAUGHTER WAS HERE FOR [facility] AND IT WENT WELL PREPARED FOR DC TODAY.  Patient/Family verbalized understanding of follow-up arrangements: Yes  Individual responsible for coordination of the follow-up plan: SELF & SHELLY-DAUGHTER  Confirmed correct DME delivered: Elease Hashimoto 03/30/2019    Elease Hashimoto

## 2019-04-03 ENCOUNTER — Telehealth: Payer: Self-pay | Admitting: *Deleted

## 2019-04-03 NOTE — Telephone Encounter (Signed)
Yes.  Please collect UA/Ucx and send results to PCP.   Thanks.

## 2019-04-03 NOTE — Telephone Encounter (Signed)
Sonya RN calling for SN POC 1wk1, 2wk1, 1wk1, qowk5 and 2 prn visits.  Approval given.  Sonya called back later and said that Ms Bryden is having decreased urine output and it is dark in color. She is also having some confusion. Davy Pique is asking if she should collect a UA/ C&S.  Please advise

## 2019-04-04 ENCOUNTER — Other Ambulatory Visit: Payer: Self-pay

## 2019-04-04 ENCOUNTER — Emergency Department (HOSPITAL_COMMUNITY)
Admission: EM | Admit: 2019-04-04 | Discharge: 2019-04-04 | Disposition: A | Discharge status: Home / Self Care | Payer: Medicare Other | Attending: Emergency Medicine | Admitting: Emergency Medicine

## 2019-04-04 ENCOUNTER — Encounter: Payer: Medicare Other | Attending: Registered Nurse | Admitting: Registered Nurse

## 2019-04-04 ENCOUNTER — Encounter: Payer: Self-pay | Admitting: Registered Nurse

## 2019-04-04 DIAGNOSIS — Z89511 Acquired absence of right leg below knee: Secondary | ICD-10-CM

## 2019-04-04 DIAGNOSIS — Z7902 Long term (current) use of antithrombotics/antiplatelets: Secondary | ICD-10-CM | POA: Diagnosis not present

## 2019-04-04 DIAGNOSIS — I5022 Chronic systolic (congestive) heart failure: Secondary | ICD-10-CM | POA: Insufficient documentation

## 2019-04-04 DIAGNOSIS — I259 Chronic ischemic heart disease, unspecified: Secondary | ICD-10-CM | POA: Diagnosis not present

## 2019-04-04 DIAGNOSIS — Z7982 Long term (current) use of aspirin: Secondary | ICD-10-CM | POA: Diagnosis not present

## 2019-04-04 DIAGNOSIS — R339 Retention of urine, unspecified: Secondary | ICD-10-CM | POA: Insufficient documentation

## 2019-04-04 DIAGNOSIS — E1143 Type 2 diabetes mellitus with diabetic autonomic (poly)neuropathy: Secondary | ICD-10-CM | POA: Diagnosis not present

## 2019-04-04 DIAGNOSIS — E1142 Type 2 diabetes mellitus with diabetic polyneuropathy: Secondary | ICD-10-CM | POA: Diagnosis not present

## 2019-04-04 DIAGNOSIS — N179 Acute kidney failure, unspecified: Secondary | ICD-10-CM

## 2019-04-04 DIAGNOSIS — Z79899 Other long term (current) drug therapy: Secondary | ICD-10-CM | POA: Diagnosis not present

## 2019-04-04 DIAGNOSIS — Z794 Long term (current) use of insulin: Secondary | ICD-10-CM | POA: Insufficient documentation

## 2019-04-04 DIAGNOSIS — E1169 Type 2 diabetes mellitus with other specified complication: Secondary | ICD-10-CM

## 2019-04-04 DIAGNOSIS — I5023 Acute on chronic systolic (congestive) heart failure: Secondary | ICD-10-CM

## 2019-04-04 DIAGNOSIS — N139 Obstructive and reflux uropathy, unspecified: Secondary | ICD-10-CM | POA: Insufficient documentation

## 2019-04-04 DIAGNOSIS — M869 Osteomyelitis, unspecified: Secondary | ICD-10-CM

## 2019-04-04 DIAGNOSIS — F411 Generalized anxiety disorder: Secondary | ICD-10-CM

## 2019-04-04 LAB — URINALYSIS, ROUTINE W REFLEX MICROSCOPIC
Bilirubin Urine: NEGATIVE
Glucose, UA: 50 mg/dL — AB
Ketones, ur: NEGATIVE mg/dL
Leukocytes,Ua: NEGATIVE
Nitrite: NEGATIVE
Protein, ur: NEGATIVE mg/dL
Specific Gravity, Urine: 1.009 (ref 1.005–1.030)
pH: 5 (ref 5.0–8.0)

## 2019-04-04 LAB — CBC WITH DIFFERENTIAL/PLATELET
Abs Immature Granulocytes: 0.08 10*3/uL — ABNORMAL HIGH (ref 0.00–0.07)
Basophils Absolute: 0 10*3/uL (ref 0.0–0.1)
Basophils Relative: 0 %
Eosinophils Absolute: 0.1 10*3/uL (ref 0.0–0.5)
Eosinophils Relative: 1 %
HCT: 34.5 % — ABNORMAL LOW (ref 36.0–46.0)
Hemoglobin: 10.7 g/dL — ABNORMAL LOW (ref 12.0–15.0)
Immature Granulocytes: 1 %
Lymphocytes Relative: 26 %
Lymphs Abs: 2.4 10*3/uL (ref 0.7–4.0)
MCH: 27.6 pg (ref 26.0–34.0)
MCHC: 31 g/dL (ref 30.0–36.0)
MCV: 88.9 fL (ref 80.0–100.0)
Monocytes Absolute: 0.9 10*3/uL (ref 0.1–1.0)
Monocytes Relative: 10 %
Neutro Abs: 5.7 10*3/uL (ref 1.7–7.7)
Neutrophils Relative %: 62 %
Platelets: 198 10*3/uL (ref 150–400)
RBC: 3.88 MIL/uL (ref 3.87–5.11)
RDW: 18.8 % — ABNORMAL HIGH (ref 11.5–15.5)
WBC: 9.2 10*3/uL (ref 4.0–10.5)
nRBC: 0 % (ref 0.0–0.2)

## 2019-04-04 LAB — BASIC METABOLIC PANEL
Anion gap: 12 (ref 5–15)
BUN: 60 mg/dL — ABNORMAL HIGH (ref 8–23)
CO2: 26 mmol/L (ref 22–32)
Calcium: 8.5 mg/dL — ABNORMAL LOW (ref 8.9–10.3)
Chloride: 90 mmol/L — ABNORMAL LOW (ref 98–111)
Creatinine, Ser: 4.9 mg/dL — ABNORMAL HIGH (ref 0.44–1.00)
GFR calc Af Amer: 10 mL/min — ABNORMAL LOW (ref >60)
GFR calc non Af Amer: 8 mL/min — ABNORMAL LOW (ref >60)
Glucose, Bld: 278 mg/dL — ABNORMAL HIGH (ref 70–99)
Potassium: 4.9 mmol/L (ref 3.5–5.1)
Sodium: 128 mmol/L — ABNORMAL LOW (ref 135–145)

## 2019-04-04 NOTE — ED Notes (Addendum)
Attempted lab draw x 2 but unsuccessful. RN Kandice Moos made aware.

## 2019-04-04 NOTE — Discharge Instructions (Addendum)
Today your urine showed no signs of infection but your kidney function has increased from 1.5-4.9.  This is thought to be related to not being able to urinate.  The urinary catheter needs to remain in place until the doctor's office removes it.  You need to follow-up with her doctor on Thursday or Friday to recheck your kidney function.  If in the meantime you develop fever, severe abdominal pain, vomiting or other concerns please return

## 2019-04-04 NOTE — Telephone Encounter (Signed)
Notified HHRN 

## 2019-04-04 NOTE — ED Triage Notes (Signed)
Patient coming from home with c/o urinary retention. Patient state she only urinated 1 time in three days. Last time was yesterday.

## 2019-04-04 NOTE — ED Provider Notes (Signed)
[facility] DEPT Provider Note   CSN: [id number] Arrival date & time: 04/04/19  1223    History   Chief Complaint Chief Complaint  Patient presents with  . Urinary Retention    HPI [name] is a 68 y.o. female.     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  Patient states this is been ongoing since Saturday or Sunday.  She states she can urinate just a little bit but she seems to have long periods in between where she is not urinating at all.  She has some discomfort in her lower abdomen but no severe pain.  She denies any constipation and had a bowel movement this morning.  She has had no dysuria, fever, nausea or vomiting.  Everything with her surgery has gone well and she has no issues with the stump.  She states she feels the urge to urinate but will only dribble a little bit.  The history is provided by the patient.    Past Medical History:  Diagnosis Date  . Below-knee amputation of right lower extremity ([place]) 03/06/2019  . 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 CHF (congestive heart failure) ([place])   . Complication of anesthesia   . Coronary artery disease   . Diabetes mellitus without complication ([place])    type 2   . Fracture    right ankle  . Fracture of humerus, proximal, left, closed 02/23/2014  . Full dentures   . Myocardial infarct ([place])   . Neuropathic pain   . Panic attacks    Hx: of  . PONV (postoperative nausea and vomiting)   . Wears glasses     Patient Active Problem List   Diagnosis Date Noted  . Drug induced constipation   . Anxiety state   . Diabetic gastroparesis ([place])   . Arterial hypotension   . Hyponatremia   . Leucocytosis   . AKI (acute kidney injury) ([place])   . Diabetic osteomyelitis ([place])   . Hypokalemia   . Diabetic peripheral  neuropathy ([place])   . Labile blood glucose   . Hypoglycemia   . Acute on chronic systolic heart failure ([place])   . Right below-knee amputee ([place]) 03/13/2019  . Supplemental oxygen dependent   . Postoperative pain   . Acute blood loss anemia   . Acute on chronic systolic (congestive) heart failure ([place])   . Orthostasis   . Diabetes mellitus type 2 in obese ([place])   . Dyslipidemia   . Non-STEMI (non-ST elevated myocardial infarction) ([place])   . Encounter for central line placement   . Below-knee amputation of right lower extremity ([place]) 03/06/2019  . S/P below knee amputation, right ([place]) 03/06/2019  . Hypoxemia   . Shock circulatory ([place])   . Acute pain   . Acute encephalopathy   . Gastroparesis due to DM ([place]) 04/21/2016  . DM type 2 with diabetic peripheral neuropathy ([place]) 03/12/2016  . 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 Peripheral polyneuropathy 11/14/2014  . Fracture of humerus, proximal, left, closed 02/23/2014  . Proximal humerus fracture 02/23/2014    Past Surgical History:  Procedure Laterality Date  . APPENDECTOMY    . BREAST BIOPSY Left 02/12/15   benign, clip placed  . BREAST SURGERY Right 2012   Right mastectomy with SLN, DCIS, grade 1-2. ER/PR not  performed.   [facility] CARDIAC CATHETERIZATION    . cardiac stents  2007  . CHOLECYSTECTOMY    . COLON SURGERY     Subtotal colectomy for colonic inertia.  . COLONOSCOPY    . DILATION AND CURETTAGE OF UTERUS    . EYE SURGERY    . LEFT HEART CATH AND CORONARY ANGIOGRAPHY Left 01/14/2018   Procedure: LEFT HEART CATH AND CORONARY ANGIOGRAPHY;  Surgeon: Dionisio David, MD;  Location: [facility];  Service: Cardiovascular;  Laterality: Left;  . LEFT HEART CATH AND CORONARY ANGIOGRAPHY N/A 03/10/2019   Procedure: LEFT HEART CATH AND CORONARY ANGIOGRAPHY;  Surgeon: Larey Dresser, MD;  Location: [facility];  Service: Cardiovascular;  Laterality: N/A;  . MASTECTOMY Right 2012   Hx: of right breast, DCIS   .  MULTIPLE TOOTH EXTRACTIONS    . ORIF ANKLE FRACTURE Right 03/06/2019   Procedure: RIGHT BELOW KNEE AMPUTATION;  Surgeon: Marchia Bond, MD;  Location: [facility];  Service: Orthopedics;  Laterality: Right;  . ORIF HUMERUS FRACTURE Left 02/23/2014   Procedure: OPEN REDUCTION INTERNAL FIXATION (ORIF) PROXIMAL HUMERUS FRACTURE;  Surgeon: Johnny Bridge, MD;  Location: [facility];  Service: Orthopedics;  Laterality: Left;  . TUBAL LIGATION       OB History    Gravida  4   Para  4   Term      Preterm      AB      Living        SAB      TAB      Ectopic      Multiple      Live Births           Obstetric Comments  1st Menstrual Cycle:  11 1st Pregnancy:  15          Home Medications    Prior to Admission medications   Medication Sig Start Date End Date Taking? Authorizing Provider  acetaminophen (TYLENOL) 325 MG tablet Take 2 tablets (650 mg total) by mouth every 6 (six) hours as needed for mild pain, fever or headache. 03/30/19   Angiulli, Lavon Paganini, PA-C  aspirin EC 81 MG EC tablet Take 1 tablet (81 mg total) by mouth daily. 03/30/19   Angiulli, Lavon Paganini, PA-C  atorvastatin (LIPITOR) 40 MG tablet Take 1 tablet (40 mg total) by mouth at bedtime. 03/30/19   Angiulli, Lavon Paganini, PA-C  Cholecalciferol (VITAMIN D) 50 MCG (2000 UT) CAPS Take 1 capsule (2,000 Units total) by mouth daily. 03/30/19   Angiulli, Lavon Paganini, PA-C  clonazePAM (KLONOPIN) 0.5 MG tablet Take 1 tablet (0.5 mg total) by mouth daily as needed for anxiety. 03/30/19   Angiulli, Lavon Paganini, PA-C  clopidogrel (PLAVIX) 75 MG tablet Take 1 tablet (75 mg total) by mouth daily with breakfast. 03/30/19   Angiulli, Lavon Paganini, PA-C  dicyclomine (BENTYL) 10 MG capsule Take 1 capsule (10 mg total) by mouth 3 (three) times daily with meals as needed for spasms. Patient not taking: Reported on 02/21/2019 04/25/18   Arta Silence, MD  digoxin (LANOXIN) 0.125 MG tablet Take 1 tablet (0.125 mg total) by mouth daily. 03/30/19   Angiulli, Lavon Paganini,  PA-C  diphenoxylate-atropine (LOMOTIL) 2.5-0.025 MG tablet Take 1 tablet by mouth 4 (four) times daily as needed for diarrhea or loose stools.  11/24/17   [provider]  gabapentin (NEURONTIN) 300 MG capsule Take 1 capsule (300 mg total) by mouth 3 (three) times daily. 03/30/19  Angiulli, Lavon Paganini, PA-C  insulin aspart (NOVOLOG) 100 UNIT/ML FlexPen Inject 7 Units into the skin 3 (three) times daily with meals. 03/30/19   Angiulli, Lavon Paganini, PA-C  Insulin Glargine (LANTUS) 100 UNIT/ML Solostar Pen Inject 53 Units into the skin 2 (two) times daily. 03/30/19   Angiulli, Lavon Paganini, PA-C  losartan (COZAAR) 25 MG tablet Take 0.5 tablets (12.5 mg total) by mouth at bedtime. 03/30/19   Angiulli, Lavon Paganini, PA-C  methocarbamol (ROBAXIN) 500 MG tablet Take 1 tablet (500 mg total) by mouth at bedtime. 03/30/19   Angiulli, Lavon Paganini, PA-C  metoCLOPramide (REGLAN) 5 MG tablet Take 1 tablet (5 mg total) by mouth at bedtime. 03/30/19   Angiulli, Lavon Paganini, PA-C  metoprolol succinate (TOPROL-XL) 25 MG 24 hr tablet Take 0.5 tablets (12.5 mg total) by mouth at bedtime. 03/30/19   Angiulli, Lavon Paganini, PA-C  oxyCODONE (OXY IR/ROXICODONE) 5 MG immediate release tablet Take 0.5 tablets (2.5 mg total) by mouth every 4 (four) hours as needed for severe pain. 03/30/19   Angiulli, Lavon Paganini, PA-C  pantoprazole (PROTONIX) 40 MG tablet Take 1 tablet (40 mg total) by mouth daily. 03/30/19   Angiulli, Lavon Paganini, PA-C  polyethylene glycol (MIRALAX / GLYCOLAX) packet Take 17 g by mouth 2 (two) times daily. 03/30/19   Angiulli, Lavon Paganini, PA-C  spironolactone (ALDACTONE) 25 MG tablet Take 1 tablet (25 mg total) by mouth daily. 03/30/19   Angiulli, Lavon Paganini, PA-C  torsemide (DEMADEX) 20 MG tablet Take 2 tablets (40 mg total) by mouth daily. 03/30/19   Angiulli, Lavon Paganini, PA-C  traMADol (ULTRAM) 50 MG tablet Take 2 tablets (100 mg total) by mouth 3 (three) times daily. 03/30/19   Angiulli, Lavon Paganini, PA-C    Family History Family History  Problem  Relation Age of Onset  . Diabetes Mother   . Hypertension Mother   . Stroke Mother   . Diabetes Brother   . Breast cancer Neg Hx     Social History Social History   Tobacco Use  . Smoking status: Never Smoker  . Smokeless tobacco: Never Used  Substance Use Topics  . Alcohol use: No    Alcohol/week: 0.0 standard drinks  . Drug use: No     Allergies   Cymbalta [duloxetine hcl] and Toradol [ketorolac tromethamine]   Review of Systems Review of Systems  Skin:       Skin breakdown in the gluteal cleft that she has been putting cream on but it's painful and getting worse.  All other systems reviewed and are negative.    Physical Exam Updated Vital Signs BP (!) 121/56 (BP Location: Left Arm)   Pulse 72   Temp 97.7 F (36.5 C) (Oral)   Resp 18   SpO2 97%   Physical Exam Vitals signs and nursing note reviewed.  Constitutional:      General: She is not in acute distress.    Appearance: She is well-developed.  HENT:     Head: Normocephalic and atraumatic.  Eyes:     Pupils: Pupils are equal, round, and reactive to light.  Cardiovascular:     Rate and Rhythm: Normal rate and regular rhythm.     Heart sounds: Normal heart sounds. No murmur. No friction rub.  Pulmonary:     Effort: Pulmonary effort is normal.     Breath sounds: Normal breath sounds. No wheezing or rales.  Abdominal:     General: Bowel sounds are normal. There is no distension.  Palpations: Abdomen is soft.     Tenderness: There is abdominal tenderness in the suprapubic area. There is no right CVA tenderness, left CVA tenderness, guarding or rebound.  Musculoskeletal: Normal range of motion.        General: No tenderness.       Back:     Right lower leg: No edema.     Left lower leg: No edema.     Comments: Right BKA is wrapped and dressing is clean.  No leg swelling or redness  Skin:    General: Skin is warm and dry.     Findings: No rash.  Neurological:     General: No focal deficit  present.     Mental Status: She is alert and oriented to person, place, and time. Mental status is at baseline.     Cranial Nerves: No cranial nerve deficit.  Psychiatric:        Behavior: Behavior normal.      ED Treatments / Results  Labs (all labs ordered are listed, but only abnormal results are displayed) Labs Reviewed  CBC WITH DIFFERENTIAL/PLATELET - Abnormal; Notable for the following components:      Result Value   Hemoglobin 10.7 (*)    HCT 34.5 (*)    RDW 18.8 (*)    Abs Immature Granulocytes 0.08 (*)    All other components within normal limits  BASIC METABOLIC PANEL - Abnormal; Notable for the following components:   Sodium 128 (*)    Chloride 90 (*)    Glucose, Bld 278 (*)    BUN 60 (*)    Creatinine, Ser 4.90 (*)    Calcium 8.5 (*)    GFR calc non Af Amer 8 (*)    GFR calc Af Amer 10 (*)    All other components within normal limits  URINALYSIS, ROUTINE W REFLEX MICROSCOPIC - Abnormal; Notable for the following components:   Glucose, UA 50 (*)    Hgb urine dipstick SMALL (*)    Bacteria, UA RARE (*)    All other components within normal limits  URINE CULTURE    EKG None  Radiology No results found.  Procedures Procedures (including critical care time)  Medications Ordered in ED Medications - No data to display   Initial Impression / Assessment and Plan / ED Course  I have reviewed the triage vital signs and the nursing notes.  Pertinent labs & imaging results that were available during my care of the patient were reviewed by me and considered in my medical decision making (see chart for details).        Patient is a 68 year old presenting with difficulty urinating since Saturday or Sunday.  She states she has the urge to go but only will get a small amount out.  Here she has some mild suprapubic tenderness but no other acute findings except for a stage II decubitus wound in her gluteal cleft.  Patient had the urge to urinate while in the room  but she only was able to get 60 mL's out and bladder scan still right greater than 300.  Will place a Foley catheter and check for infection.  Patient denies constipation and states she had a bowel movement this morning.  She is otherwise well-appearing and vital signs are reassuring.  3:29 PM Hemoglobin has improved however today patient's creatinine has increased from 1.5-4.9.  UA without evidence of infection.  After Foley catheter was placed patient has had 700 mL's out and concern for obstructive  uropathy as the cause of her elevated creatinine.  Patient has no other complaints and states that she definitely wants to go home.  Attempting to contact her PCP so that patient can have repeat creatinine done by Thursday or Friday.  At this time will leave Foley catheter in place.  Also discussed this with pt's daughter Darrick Penna  Final Clinical Impressions(s) / ED Diagnoses   Final diagnoses:  Urinary retention  Obstructive uropathy  AKI (acute kidney injury) [facility])    ED Discharge Orders    None       Blanchie Dessert, MD 04/04/19 1556

## 2019-04-04 NOTE — Progress Notes (Signed)
Virtual Transitional Care call Transitional Questions  Answered by Darrick Penna ( daughter)  Patient name: [name] DOB: Dec 27, 1951 1. Are you/is patient experiencing any problems since coming home? Yes, Darrick Penna states her mother became confused on 04/01/2019 and hasn't voided since 04/03/2019 at 2:30. EMS was called today, she was taken to [facility] she states.  a. Are there any questions regarding any aspect of care? No 2. Are there any questions regarding medications administration/dosing? No a. Are meds being taken as prescribed? Yes 3. "Patient should review meds with caller to confirm" Medication List Reviewed 4. Have there been any falls? No 5. Has Home Health been to the house and/or have they contacted you? Yes. [facility] a. If not, have you tried to contact them? NA b. Can we help you contact them? NA 6. Are bowels and bladder emptying properly? Bowels yes: See above note  a. Are there any unexpected incontinence issues? No b. If applicable, is patient following bowel/bladder programs? NA 7. Any fevers, problems with breathing, unexpected pain? No 8. Are there any skin problems or new areas of breakdown? No 9. Has the patient/family member arranged specialty MD follow up (ie cardiology/neurology/renal/surgical/etc.)?  Shelly reports HFU appointments will be scheduled later in the week.  a. Can we help arrange? NA 10. Does the patient need any other services or support that we can help arrange? No 11. Are caregivers following through as expected in assisting the patient? Yes 12. Has the patient quit smoking, drinking alcohol, or using drugs as recommended? Shelly states [name] doesn't smoke, drink alcohol or use illicit drugs.   Appointment date/time 05/17/19  arrival time 10:40 for 11:00 appointment with Dr. Posey Pronto. At [facility]

## 2019-04-04 NOTE — ED Notes (Signed)
Bed: FI43 Expected date:  Expected time:  Means of arrival:  Comments: EMS urinary retention

## 2019-04-05 ENCOUNTER — Telehealth: Payer: Self-pay | Admitting: *Deleted

## 2019-04-05 ENCOUNTER — Encounter: Payer: Medicare Other | Admitting: Registered Nurse

## 2019-04-05 LAB — URINE CULTURE: Culture: NO GROWTH

## 2019-04-05 NOTE — Telephone Encounter (Signed)
Lenell Antu, ST, [facility] left a message stating that patient was orthostatic hypotensive.  She wanted to make sure that Dr. Posey Pronto was aware prior to upcoming virtual visit.  She also asked for verbal orders for HHPT 1week1 followed by 2week3. Medical record reviewed. Social work note reviewed.  Verbal orders given per office protocol.

## 2019-04-10 ENCOUNTER — Other Ambulatory Visit: Payer: Self-pay

## 2019-04-10 ENCOUNTER — Ambulatory Visit (HOSPITAL_COMMUNITY)
Admission: RE | Admit: 2019-04-10 | Discharge: 2019-04-10 | Disposition: A | Discharge status: Home / Self Care | Payer: Medicare Other | Source: Ambulatory Visit | Attending: Cardiology | Admitting: Cardiology

## 2019-04-10 DIAGNOSIS — I5022 Chronic systolic (congestive) heart failure: Secondary | ICD-10-CM | POA: Diagnosis not present

## 2019-04-10 DIAGNOSIS — I2510 Atherosclerotic heart disease of native coronary artery without angina pectoris: Secondary | ICD-10-CM

## 2019-04-10 DIAGNOSIS — N179 Acute kidney failure, unspecified: Secondary | ICD-10-CM

## 2019-04-10 DIAGNOSIS — Z89511 Acquired absence of right leg below knee: Secondary | ICD-10-CM

## 2019-04-10 NOTE — Progress Notes (Addendum)
Heart Failure TeleHealth Note  Due to national recommendations of social distancing due to [place] 19, Audio/video telehealth visit is felt to be most appropriate for this patient at this time.  See MyChart message from today for patient consent regarding telehealth for [facility].  Date:  04/10/2019   ID:  [name], DOB 07-Oct-1951, MRN [id number]  Location: Home  Provider location: [place] [facility] Type of Visit: Established patient, hospital f/u  PCP:  Perrin Maltese, MD  Cardiologist: Dr Aundra Dubin  Chief Complaint: Chronic systolic HF   History of Present Illness: [name] is a 68 y.o. female with h/o CAD s/p LAD stent, chronic systolic CHF, DM2, neuropathy, gastroparesis, anxiety, panic attacks, and breast cancer s/p right mastectomy.   Admitted 3/9 to 03/13/19 with osteomyelitis in open right ankle fracture. Underwent Right BKA. She became septic and required pressors. Also had peri-op NSTEMI. HF team consulted and medications optimized as BP and renal function improved. Had [facility], see below. Also had AKI, which slowly improved. Admitted to CIR 3/16-03/30/19.  LHC/RHC (3/14): Hemodynamics:  LV 101/39 Ao 92/50 RA 14 CO (Fick) 6.28 CI (Fick) 2.92 Diagnostic  Dominance: Right  Left Main  Minimal disease.  Left Anterior Descending  Large LAD wrapping around apex. There is a patent proximal LAD stent. Diffuse luminal irregularities. There was probably a diagonal originating from the mid LAD that was occluded [place].  Ramus Intermedius  Small to moderate ramus with 95% ostial/proximal stenosis.  Left Circumflex  50% stenosis mid LCx and 50% stenosis distal LCx. 50% stenosis in proximal small-moderate OM1.  Right Coronary Artery  50% stenosis distal RCA/ostial PLV.   Seen in ED with acute urinary retention on 4/7. Creatinine had increased from 1.4 on 3/30 to 4.9. UA negative. Foley catheter was placed. There is a note from [facility] PT  that she was orthostatic on 4/8. BP not charted.   She presents via Engineer, civil (consulting) for a telehealth visit today. She was discharged from CIR on losartan 12.5 mg daily, toprol XL 12.5 mg daily, spiro 25 mg daily, digoxin 0.125 mg daily, and torsemide 40 mg daily. At some point she was switched from losartan to [facility] (unclear when or who did this, was not on med list when in ED 4/7). She is not taking spiro, metoprolol, or digoxin because it made her confused per daughter. She had repeat labs drawn on Thursday. Still has foley catheter. She feels great. No pain. Stump is healing nicely. No fever or chills. Does not yet have a prosthesis. She is mostly in wheelchair and can transfer to chair. No SOB with activity. No edema, orthopnea, or PND. No CP. Rare dry cough. Appetite and energy level good. She is sometimes dizzy with moving too quickly, occurs about once/week. Good UOP, urine is light yellow today, but is sometimes dark. Not able to weigh. No problems getting food. Not leaving the house. Has HH PT and RN through [facility]. HR and BP checked by RN, but unable to recall exact numbers. I have updated her med list to reflect what she is taking now. Confirmed with her daughter.  She denies symptoms worrisome for COVID 19.   Past Medical History:  Diagnosis Date  . Below-knee amputation of right lower extremity ([place]) 03/06/2019  . 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 CHF (congestive heart failure) ([place])   . Complication of anesthesia   . Coronary artery disease   . Diabetes mellitus without complication (  [place])    type 2   . Fracture    right ankle  . Fracture of humerus, proximal, left, closed 02/23/2014  . Full dentures   . Myocardial infarct ([place])   . Neuropathic pain   . Panic attacks    Hx: of  . PONV (postoperative nausea and vomiting)   . Wears glasses    Past Surgical History:  Procedure Laterality Date  . APPENDECTOMY    . BREAST BIOPSY Left 02/12/15   benign, clip placed  . BREAST  SURGERY Right 2012   Right mastectomy with SLN, DCIS, grade 1-2. ER/PR not performed.   Marland Kitchen CARDIAC CATHETERIZATION    . cardiac stents  2007  . CHOLECYSTECTOMY    . COLON SURGERY     Subtotal colectomy for colonic inertia.  . COLONOSCOPY    . DILATION AND CURETTAGE OF UTERUS    . EYE SURGERY    . LEFT HEART CATH AND CORONARY ANGIOGRAPHY Left 01/14/2018   Procedure: LEFT HEART CATH AND CORONARY ANGIOGRAPHY;  Surgeon: Dionisio David, MD;  Location: [place] CV [facility];  Service: Cardiovascular;  Laterality: Left;  . LEFT HEART CATH AND CORONARY ANGIOGRAPHY N/A 03/10/2019   Procedure: LEFT HEART CATH AND CORONARY ANGIOGRAPHY;  Surgeon: Larey Dresser, MD;  Location: [place] CV LAB;  Service: Cardiovascular;  Laterality: N/A;  . MASTECTOMY Right 2012   Hx: of right breast, DCIS   . MULTIPLE TOOTH EXTRACTIONS    . ORIF ANKLE FRACTURE Right 03/06/2019   Procedure: RIGHT BELOW KNEE AMPUTATION;  Surgeon: Marchia Bond, MD;  Location: [place];  Service: Orthopedics;  Laterality: Right;  . ORIF HUMERUS FRACTURE Left 02/23/2014   Procedure: OPEN REDUCTION INTERNAL FIXATION (ORIF) PROXIMAL HUMERUS FRACTURE;  Surgeon: Johnny Bridge, MD;  Location: [place];  Service: Orthopedics;  Laterality: Left;  . TUBAL LIGATION       Current Outpatient Medications  Medication Sig Dispense Refill  . aspirin EC 81 MG EC tablet Take 1 tablet (81 mg total) by mouth daily.    Marland Kitchen atorvastatin (LIPITOR) 40 MG tablet Take 1 tablet (40 mg total) by mouth at bedtime. 30 tablet 0  . Cholecalciferol (VITAMIN D) 50 MCG (2000 UT) CAPS Take 1 capsule (2,000 Units total) by mouth daily. (Patient taking differently: Take 2,000 Units by mouth at bedtime. ) 30 capsule 0  . clopidogrel (PLAVIX) 75 MG tablet Take 1 tablet (75 mg total) by mouth daily with breakfast. 30 tablet 0  . gabapentin (NEURONTIN) 600 MG tablet Take 1,200 mg by mouth 3 (three) times daily.    . metoCLOPramide (REGLAN) 10 MG tablet Take 10 mg by mouth at  bedtime.     . pantoprazole (PROTONIX) 40 MG tablet Take 1 tablet (40 mg total) by mouth daily. 30 tablet 0  . sacubitril-valsartan (ENTRESTO) 24-26 MG Take 1 tablet by mouth 2 (two) times daily.    Marland Kitchen torsemide (DEMADEX) 20 MG tablet Take 2 tablets (40 mg total) by mouth daily. 30 tablet 1  . traMADol (ULTRAM) 50 MG tablet Take 2 tablets (100 mg total) by mouth 3 (three) times daily. 42 tablet 0  . insulin aspart (NOVOLOG) 100 UNIT/ML FlexPen Inject 7 Units into the skin 3 (three) times daily with meals. (Patient taking differently: Inject 0-50 Units into the skin 2 (two) times daily. Per sliding scale) 15 mL 11  . Insulin Glargine (LANTUS) 100 UNIT/ML Solostar Pen Inject 53 Units into the skin 2 (two) times daily. (Patient taking differently: Inject  80 Units into the skin 2 (two) times daily. ) 15 mL 11  . metoCLOPramide (REGLAN) 5 MG tablet Take 1 tablet (5 mg total) by mouth at bedtime. (Patient not taking: Reported on 04/10/2019) 30 tablet 0   No current facility-administered medications for this encounter.     Allergies:   Cymbalta [duloxetine hcl] and Toradol [ketorolac tromethamine]   Social History:  The patient  reports that she has never smoked. She has never used smokeless tobacco. She reports that she does not drink alcohol or use drugs.   Family History:  The patient's family history includes Diabetes in her brother and mother; Hypertension in her mother; Stroke in her mother.   ROS:  Please see the history of present illness.   All other systems are personally reviewed and negative.   Exam:  (Video/Tele Health Call; Exam is subjective and or/visual.) General:  Speaks in full sentences. No resp difficulty. Lungs: Normal respiratory effort with conversation.  Abdomen: Non-distended per patient report Extremities: Pt denies edema. Right BKA.  Neuro: Alert & oriented x 3.   Recent Labs: 02/09/2019: B Natriuretic Peptide 706.0 03/14/2019: ALT 19 03/27/2019: Magnesium 1.8 04/04/2019:  BUN 60; Creatinine, Ser 4.90; Hemoglobin 10.7; Platelets 198; Potassium 4.9; Sodium 128  Personally reviewed   Wt Readings from Last 3 Encounters:  03/30/19 86.7 kg (191 lb 2.2 oz)  03/13/19 91 kg (200 lb 9.9 oz)  02/09/19 99.8 kg (220 lb)      ASSESSMENT AND PLAN:  1. CAD: Patient has history of prior anterior MI with LAD stent. She developed hypotension but no significant ECG changes or chest pain post-op 3/9. Troponin up to 29. Suspect peri-op NSTEMI. LHC on 3/13 showed 95% stenosis ostial/proximal small-moderate ramus and suspected ostial diagonal occlusion. One of these vessels may have been culprit lesion, no obstructive disease in the major coronaries, no interventional target (ramus too small for stent placement).  - No s/s ischemia  - Continue ASA and statin.  -Continue Plavix 75 mg daily. - She took herself off Toprol XL 12.5 mg daily. Would like to add back eventually, but will hold off with recent orthostasis.  2. Chronic systolic CHF: Ischemic cardiomyopathy. Baseline EF is in the 25-30% range. Echo 03/07/19:  EF 20-25%.  - Volume status sounds okay. Possibly dry.  Will see if [facility] can check a REDS reading on her.  - Continue torsemide 40 mg daily for now.  -Offmidodrine  - Hold Entresto until we have BMET from Thursday.  - She has taken herself off digoxin and spiro. Would like to restart once renal function stabilized.  - Followed by [facility] PT and RN - We discussed possibility of ICD down the road if her heart does not get stronger. Would have to wait until incisions from BKA have healed. She would like to avoid ICD. We will plan to optimize her medications and repeat echo in 3 months 4. AKI with acute urinary retention - Creatinine had improved to 1.40 on 3/30, up to 4.9 on 4/7 in setting of urinary retention.  - Now has foley catheter. PCP is managing. If creatinine normal, they will plan on removing.  - As above, hold Entresto, dig, and spiro with AKI.  5. Right  BKA - Followed by [facility] RN and PT through [facility].  - Healing well per patient. No fever/chills. Pain well controlled   COVID screen The patient does not have any symptoms that suggest any further testing/ screening at this time.  Social distancing reinforced today.  Relevant cardiac medications were reviewed at length with the patient today.   The patient does not have concerns regarding their medications at this time.   The following changes were made today: Hold Entresto until recheck BMET. She had labs drawn on Thursday. I called PCP office and was told she had labs drawn by [facility]. Will contact them for results. Will also see if it is possible to get a REDS vest reading on her since she is unable to weigh due to BKA and volume is very difficult to assess.  Recommended follow-up:  1 week by telephone. I have asked her to keep track of her BP and HR when [facility] RN checks.   Today, I have spent 17 minutes with the patient with telehealth technology discussing the above issues .    Signed, Georgiana Shore, NP  04/10/2019 10:45 AM  [place] [address] [facility] [place] [address] [phone] (office) [phone] (fax)

## 2019-04-10 NOTE — Addendum Note (Signed)
Encounter addended by: Valeda Malm, RN on: 04/10/2019 1:20 PM  Actions taken: Clinical Note Signed

## 2019-04-10 NOTE — Patient Instructions (Addendum)
HOLD Entresto until we receive labs from [place] will do Reds Vest 4/14. Results will be forwarded to office.   Your physician recommends that you schedule a follow-up appointment in: 04/17/19 at 12pm

## 2019-04-10 NOTE — Progress Notes (Signed)
Per NP, pt needs Red Vest. Spoke with [place], it can be done tomorrow. They will call office with results of vest.   Results of labs faxed to Dr Lamonte Sakai per NP Caryl Pina.    Called patient to discuss AVS, pt aware of all instructions. Pt will have telehealth visit 1 week. Verbalized understanding

## 2019-04-10 NOTE — Addendum Note (Signed)
Encounter addended by: Harvie Junior, CMA on: 04/10/2019 11:59 AM  Actions taken: Clinical Note Signed

## 2019-04-10 NOTE — Addendum Note (Signed)
Encounter addended by: Georgiana Shore, NP on: 04/10/2019 10:58 AM  Actions taken: Clinical Note Signed

## 2019-04-11 ENCOUNTER — Other Ambulatory Visit: Payer: Self-pay

## 2019-04-11 ENCOUNTER — Encounter: Payer: Self-pay | Admitting: Anesthesiology

## 2019-04-11 ENCOUNTER — Telehealth (HOSPITAL_COMMUNITY): Payer: Self-pay | Admitting: *Deleted

## 2019-04-11 ENCOUNTER — Ambulatory Visit: Payer: Medicare Other | Attending: Anesthesiology | Admitting: Anesthesiology

## 2019-04-11 DIAGNOSIS — F451 Undifferentiated somatoform disorder: Secondary | ICD-10-CM | POA: Diagnosis not present

## 2019-04-11 DIAGNOSIS — Z0000 Encounter for general adult medical examination without abnormal findings: Secondary | ICD-10-CM

## 2019-04-11 DIAGNOSIS — M792 Neuralgia and neuritis, unspecified: Secondary | ICD-10-CM

## 2019-04-11 MED ORDER — TRAMADOL HCL 50 MG PO TABS: 100.0000 mg | ORAL_TABLET | Freq: Three times a day (TID) | ORAL | 0 refills | Status: DC

## 2019-04-11 NOTE — Progress Notes (Signed)
Virtual Visit via Telephone Note  I connected with [name] on 04/11/19 at  1:00 PM EDT by telephone and verified that I am speaking with the correct person using two identifiers.   I discussed the limitations, risks, security and privacy concerns of performing an evaluation and management service by telephone and the availability of in person appointments. I also discussed with the patient that there may be a patient responsible charge related to this service. The patient expressed understanding and agreed to proceed.   History of Present Illness:[name] has had recent surgery and reports an amputation of her foot secondary to recent infection.  The pain has been persistent and she does not like the oxycodone.  The quality of her baseline pain is otherwise unchanged.       Observations/Objective:   Assessment and Plan: 1. Neuropathic pain of foot, right   2. Neuropathic pain of hand, unspecified laterality   3. Neuropathic pain of foot, left   4. Somatic symptom disorder, persistent, moderate, with predominant pain   We will refill her ultram for three months.  RTC following the pandemic in 3 months and continue follow up with her primary care doctors.  We have reviewed the PDMP and see no issues.     Follow Up Instructions:    I discussed the assessment and treatment plan with the patient. The patient was provided an opportunity to ask questions and all were answered. The patient agreed with the plan and demonstrated an understanding of the instructions.   The patient was advised to call back or seek an in-person evaluation if the symptoms worsen or if the condition fails to improve as anticipated.  I provided 20 minutes of non-face-to-face time during this encounter.   Molli Barrows, MD

## 2019-04-11 NOTE — Telephone Encounter (Signed)
HHRN called to report ReDS clip reading today was 33% and BP 118/74, she states pt is feeling good and denies SOB, will send info to [place], NP

## 2019-04-12 ENCOUNTER — Telehealth: Payer: Self-pay | Admitting: Anesthesiology

## 2019-04-12 NOTE — Telephone Encounter (Signed)
Pt would like to know which pharmacy her prescriptions were sent to yesterday after her virtual visit with Dr Andree Elk she stated she has 2 pharmacies that she uses one is a mail order and the other is a [facility].

## 2019-04-12 NOTE — Telephone Encounter (Signed)
Patient informed that Tramadol was sent to [place] and [place].

## 2019-04-17 ENCOUNTER — Other Ambulatory Visit: Payer: Self-pay

## 2019-04-17 ENCOUNTER — Ambulatory Visit (HOSPITAL_COMMUNITY)
Admission: RE | Admit: 2019-04-17 | Discharge: 2019-04-17 | Disposition: A | Discharge status: Home / Self Care | Payer: Medicare Other | Source: Ambulatory Visit | Attending: Adult Health | Admitting: Adult Health

## 2019-04-17 DIAGNOSIS — N179 Acute kidney failure, unspecified: Secondary | ICD-10-CM

## 2019-04-17 DIAGNOSIS — I5022 Chronic systolic (congestive) heart failure: Secondary | ICD-10-CM | POA: Diagnosis not present

## 2019-04-17 DIAGNOSIS — I2510 Atherosclerotic heart disease of native coronary artery without angina pectoris: Secondary | ICD-10-CM

## 2019-04-17 MED ORDER — METOPROLOL SUCCINATE ER 25 MG PO TB24: 12.5000 mg | ORAL_TABLET | Freq: Every day | ORAL | 1 refills | Status: AC

## 2019-04-17 NOTE — Progress Notes (Signed)
Heart Failure TeleHealth Note  Due to national recommendations of social distancing due to [place] 19, telehealth visit is felt to be most appropriate for this patient at this time.  I discussed the limitations, risks, security and privacy concerns of performing an evaluation and management service by telephone and the availability of in person appointments. I also discussed with the patient that there may be a patient responsible charge related to this service. The patient expressed understanding and agreed to proceed.   ID:  [name], DOB Dec 28, 1951, MRN [id number]  Location: Home  Provider location: [address], [place] Type of Visit: Established patient   PCP:  Perrin Maltese, MD  Primary HF: Dr Aundra Dubin  Chief Complaint: HF follow up   History of Present Illness: [name] a 68 y.o.femalewith h/o CAD s/p LAD stent, chronic systolic CHF, DM2, neuropathy, gastroparesis, anxiety, panic attacks, and breast cancer s/p right mastectomy.   Admitted 3/9 to 03/13/19 with osteomyelitis in open right ankle fracture. Underwent Right BKA. She became septic and required pressors. Also had peri-op NSTEMI. HF team consulted and medications optimized as BP and renal function improved. Had [facility], see below. Also had AKI, which slowly improved. Admitted to CIR 3/16-03/30/19.  LHC/RHC (3/14): Hemodynamics:  LV 101/39 Ao 92/50 RA 14 CO (Fick) 6.28 CI (Fick) 2.92 Diagnostic  Dominance: Right  Left Main  Minimal disease.  Left Anterior Descending  Large LAD wrapping around apex. There is a patent proximal LAD stent. Diffuse luminal irregularities. There was probably a diagonal originating from the mid LAD that was occluded [place].  Ramus Intermedius  Small to moderate ramus with 95% ostial/proximal stenosis.  Left Circumflex  50% stenosis mid LCx and 50% stenosis distal LCx. 50% stenosis in proximal small-moderate OM1.  Right Coronary Artery  50%  stenosis distal RCA/ostial PLV.   Seen in ED with acute urinary retention on 4/7. Creatinine had increased from 1.4 on 3/30 to 4.9. UA negative. Foley catheter was placed. There is a note from [facility] PT that she was orthostatic on 4/8. BP not charted.   She had a telehealth visit on 4/13. Delene Loll was stopped with AKI (creatinine up to 4.9).   She presents via Psychiatric nurse for a telehealth visit today. Overall doing well. She continues to urinate a lot with clear yellow urine. Able to transfer without SOB or CP. No edema, orthopnea, or PND. No dizziness. Appetite and energy level okay. Still followed by [facility] RN and PT. Stump healing well. Sacrum is healed and is wearing pads. Foley catheter is out. Going to see Dr Mardelle Matte for stump stitch removal this week. Taking all medications. Unable to weigh with recent BKA.  BP: 98/56 HR: 72  ReDS reading 33%, BP 118/74 on 4/14.   Pt denies symptoms of cough, fevers, chills, or new SOB worrisome for COVID 19.   Past Medical History:  Diagnosis Date  . Below-knee amputation of right lower extremity ([place]) 03/06/2019  . 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 CHF (congestive heart failure) ([place])   . Complication of anesthesia   . Coronary artery disease   . Diabetes mellitus without complication ([place])    type 2   . Fracture    right ankle  . Fracture of humerus, proximal, left, closed 02/23/2014  . Full dentures   . Myocardial infarct ([place])   . Neuropathic pain   . Panic attacks    Hx: of  . PONV (postoperative nausea  and vomiting)   . Wears glasses    Past Surgical History:  Procedure Laterality Date  . APPENDECTOMY    . BREAST BIOPSY Left 02/12/15   benign, clip placed  . BREAST SURGERY Right 2012   Right mastectomy with SLN, DCIS, grade 1-2. ER/PR not performed.   Marland Kitchen CARDIAC CATHETERIZATION    . cardiac stents  2007  . CHOLECYSTECTOMY    . COLON SURGERY     Subtotal colectomy for colonic inertia.  . COLONOSCOPY    . DILATION AND CURETTAGE OF  UTERUS    . EYE SURGERY    . LEFT HEART CATH AND CORONARY ANGIOGRAPHY Left 01/14/2018   Procedure: LEFT HEART CATH AND CORONARY ANGIOGRAPHY;  Surgeon: Dionisio David, MD;  Location: [place] CV LAB;  Service: Cardiovascular;  Laterality: Left;  . LEFT HEART CATH AND CORONARY ANGIOGRAPHY N/A 03/10/2019   Procedure: LEFT HEART CATH AND CORONARY ANGIOGRAPHY;  Surgeon: Larey Dresser, MD;  Location: [place] CV LAB;  Service: Cardiovascular;  Laterality: N/A;  . MASTECTOMY Right 2012   Hx: of right breast, DCIS   . MULTIPLE TOOTH EXTRACTIONS    . ORIF ANKLE FRACTURE Right 03/06/2019   Procedure: RIGHT BELOW KNEE AMPUTATION;  Surgeon: Marchia Bond, MD;  Location: [place];  Service: Orthopedics;  Laterality: Right;  . ORIF HUMERUS FRACTURE Left 02/23/2014   Procedure: OPEN REDUCTION INTERNAL FIXATION (ORIF) PROXIMAL HUMERUS FRACTURE;  Surgeon: Johnny Bridge, MD;  Location: [place];  Service: Orthopedics;  Laterality: Left;  . TUBAL LIGATION       Current Outpatient Medications  Medication Sig Dispense Refill  . aspirin EC 81 MG EC tablet Take 1 tablet (81 mg total) by mouth daily.    Marland Kitchen atorvastatin (LIPITOR) 40 MG tablet Take 1 tablet (40 mg total) by mouth at bedtime. 30 tablet 0  . Cholecalciferol (VITAMIN D) 50 MCG (2000 UT) CAPS Take 1 capsule (2,000 Units total) by mouth daily. (Patient taking differently: Take 2,000 Units by mouth at bedtime. ) 30 capsule 0  . clopidogrel (PLAVIX) 75 MG tablet Take 1 tablet (75 mg total) by mouth daily with breakfast. 30 tablet 0  . gabapentin (NEURONTIN) 600 MG tablet Take 1,200 mg by mouth 3 (three) times daily.    . insulin aspart (NOVOLOG) 100 UNIT/ML FlexPen Inject 7 Units into the skin 3 (three) times daily with meals. (Patient taking differently: Inject 0-50 Units into the skin 2 (two) times daily. Per sliding scale) 15 mL 11  . Insulin Glargine (LANTUS) 100 UNIT/ML Solostar Pen Inject 53 Units into the skin 2 (two) times daily. (Patient taking  differently: Inject 80 Units into the skin 2 (two) times daily. ) 15 mL 11  . metoCLOPramide (REGLAN) 10 MG tablet Take 10 mg by mouth at bedtime.     . pantoprazole (PROTONIX) 40 MG tablet Take 1 tablet (40 mg total) by mouth daily. 30 tablet 0  . torsemide (DEMADEX) 20 MG tablet Take 2 tablets (40 mg total) by mouth daily. 30 tablet 1  . traMADol (ULTRAM) 50 MG tablet Take 2 tablets (100 mg total) by mouth 3 (three) times daily. 540 tablet 0  . metoCLOPramide (REGLAN) 5 MG tablet Take 1 tablet (5 mg total) by mouth at bedtime. (Patient not taking: Reported on 04/10/2019) 30 tablet 0  . sacubitril-valsartan (ENTRESTO) 24-26 MG Take 1 tablet by mouth 2 (two) times daily.     No current facility-administered medications for this encounter.     Allergies:  Cymbalta [duloxetine hcl] and Toradol [ketorolac tromethamine]   Social History:  The patient  reports that she has never smoked. She has never used smokeless tobacco. She reports that she does not drink alcohol or use drugs.   Family History:  The patient's family history includes Diabetes in her brother and mother; Hypertension in her mother; Stroke in her mother.   ROS:  Please see the history of present illness.   All other systems are personally reviewed and negative.   Exam:  [facility] Call) Lungs: Normal respiratory effort with conversation.  Neuro: Alert & oriented x 3.  Right BKA. Stump healing well. No edema.   Recent Labs: 02/09/2019: B Natriuretic Peptide 706.0 03/14/2019: ALT 19 03/27/2019: Magnesium 1.8 04/04/2019: BUN 60; Creatinine, Ser 4.90; Hemoglobin 10.7; Platelets 198; Potassium 4.9; Sodium 128  Personally reviewed   Wt Readings from Last 3 Encounters:  03/30/19 86.7 kg (191 lb 2.2 oz)  03/13/19 91 kg (200 lb 9.9 oz)  02/09/19 99.8 kg (220 lb)      ASSESSMENT AND PLAN:  1. CAD: Patient has history of prior anterior MI with LAD stent. She developed hypotension but no significant ECG changes or chest pain  post-op 3/9. Troponin up to 29. Suspect peri-op NSTEMI. LHC on 3/13 showed 95% stenosis ostial/proximal small-moderate ramus and suspected ostial diagonal occlusion. One of these vessels may have been culprit lesion, no obstructive disease in the major coronaries, no interventional target (ramus too small for stent placement). - No s/s ischemia.  - Continue ASA and statin.  -Continue Plavix 75 mg daily. - Restart Toprol XL 12.5 mg qHS 2.Chronic systolic CHF: Ischemic cardiomyopathy. Baseline EF is in the 25-30% range. Echo 03/07/19:  EF 20-25%.  - Volume status sounds stable. ReDS reading 33% (stable) on 4/14 - Continue torsemide 40 mg daily. Recheck BMET and ReDS this week.  - Restart Toprol XL 12.5 mg qHS -Offmidodrine - Off Entresto with AKI and soft BP. - Hold spiro and dig with AKI. Would like to restart once renal function stabilized.  - Followed by [facility] PT and RN - We discussed possibility of ICD down the road if her heart does not get stronger. Would have to wait until incisions from BKA have healed. She would like to avoid ICD. We will plan to optimize her medications and repeat echo in 3 months 4. AKI with acute urinary retention - Creatinine had improved to 1.40 on 3/30, up to 4.9 on 4/7 in setting of urinary retention. We had repeat labs last week and I believe creatinine was around 2.5, but cannot locate the faxed in copy.  - Foley has been removed and she is urinating with no problems. - As above, hold Entresto, dig, and spiro with AKI. Repeat BMET this week.  5. Right BKA - Followed by [facility] RN and PT through [facility].  - Healing well per patient. No fever/chills. Getting stitches out this week.    COVID screen The patient does not have any symptoms that suggest any further testing/ screening at this time.  Social distancing reinforced today.  Patient Risk: After full review of this patients clinical status, I feel that they are at moderate risk for cardiac decompensation  at this time.  Orders/Follow up: Start Toprol XL 12.5 mg qHS ([facility]). Check BMET on Wednesday through [facility]. Will see if they can get another ReDS reading as well. Follow up 2 weeks by telephone with me  Today, I have spent 12 minutes with the patient with telehealth  technology discussing the above issues.     Signed, Georgiana Shore, NP  04/17/2019 11:58 AM  [facility] [address] Heart and [place] [address] [phone] (office) [phone] (fax)

## 2019-04-17 NOTE — Patient Instructions (Addendum)
1. Start Toprol XL 12.5 mg (1/2 tab) every night ([facility]).   2. [facility] to visit on 04/19/19.  They will get blood work Research officer, trade [employer]) and Vest reading (check fluid on lungs).  3. Follow up 2 weeks by telephone with nurse practitioner.

## 2019-04-17 NOTE — Addendum Note (Signed)
Encounter addended by: Valeda Malm, RN on: 04/17/2019 12:28 PM  Actions taken: Pharmacy for encounter modified, Order list changed, Clinical Note Signed

## 2019-04-17 NOTE — Progress Notes (Signed)
Spoke with patient, AVS discussed. All questions answered and verbalized understanding. Message sent to [name] to have lab work and vest reading done at their visit on Wednesday.

## 2019-04-19 ENCOUNTER — Telehealth (HOSPITAL_COMMUNITY): Payer: Self-pay | Admitting: *Deleted

## 2019-04-19 NOTE — Telephone Encounter (Signed)
Called pt no answer. Will try again later.

## 2019-04-19 NOTE — Telephone Encounter (Signed)
Amy received an order to go out today and get a REDS vest reading on pt. Amy called the pt and the pt refused the visit. Stated she felt fine and we should wait a few more weeks to get a reading. Amy tried to inform patient why is was necessary and see if the patient would let her come to her house but pt again refused a visit.   Message routed to Lillia Mountain, NP

## 2019-04-19 NOTE — Telephone Encounter (Signed)
We also really need labs on her. Her kidney function was way up from her baseline after she had the acute urinary retention. We need to make sure it's improving. Can you see if she will agree to at least labs? The vest reading is helpful too because her kidney function may still be up if we're taking too much fluid off and we can adjust her diuretics. Thanks

## 2019-04-20 NOTE — Telephone Encounter (Signed)
Called pt again no answer

## 2019-04-21 ENCOUNTER — Telehealth (HOSPITAL_COMMUNITY): Payer: Self-pay | Admitting: *Deleted

## 2019-04-21 MED ORDER — DIGOXIN 125 MCG PO TABS: 0.1250 mg | ORAL_TABLET | Freq: Every day | ORAL | 3 refills | Status: AC

## 2019-04-21 MED ORDER — SPIRONOLACTONE 25 MG PO TABS: 12.5000 mg | ORAL_TABLET | Freq: Every day | ORAL | 3 refills | Status: AC

## 2019-04-21 MED ORDER — POTASSIUM CHLORIDE CRYS ER 20 MEQ PO TBCR: 20.0000 meq | EXTENDED_RELEASE_TABLET | Freq: Every day | ORAL | 3 refills | Status: AC

## 2019-04-21 NOTE — Telephone Encounter (Signed)
[place] called patient to go over lab results that were received via fax . Pt told her to call back at 2pm to discuss because her daughter would need to make the medication changes for her. I  called pt and did not get an answer.  Creat-1.20 Bun-13 Potassium- 2.9  Per Lanier Prude Spironolactone 12.5mg  at bedtime Take 20 meq of Potassium daily Take 0.125mg  of digoxin daily Recheck bmet and dig x one week (script sent to [facility] for labs)

## 2019-04-21 NOTE — Addendum Note (Signed)
Addended by: Kerry Dory on: 04/21/2019 03:09 PM   Modules accepted: Orders

## 2019-04-21 NOTE — Telephone Encounter (Signed)
Pt aware via daughter and voiced understanding  °

## 2019-04-21 NOTE — Telephone Encounter (Signed)
Pt had repeat labs on 4/22

## 2019-04-28 ENCOUNTER — Telehealth (HOSPITAL_COMMUNITY): Payer: Self-pay

## 2019-04-28 NOTE — Telephone Encounter (Signed)
[facility] orders signed by DM faxed

## 2019-05-01 ENCOUNTER — Other Ambulatory Visit: Payer: Self-pay

## 2019-05-01 ENCOUNTER — Ambulatory Visit (HOSPITAL_COMMUNITY)
Admission: RE | Admit: 2019-05-01 | Discharge: 2019-05-01 | Disposition: A | Discharge status: Home / Self Care | Payer: Medicare Other | Source: Ambulatory Visit | Attending: Internal Medicine | Admitting: Internal Medicine

## 2019-05-01 VITALS — BP 106/63 | HR 94

## 2019-05-01 DIAGNOSIS — I2510 Atherosclerotic heart disease of native coronary artery without angina pectoris: Secondary | ICD-10-CM

## 2019-05-01 DIAGNOSIS — I5022 Chronic systolic (congestive) heart failure: Secondary | ICD-10-CM | POA: Diagnosis not present

## 2019-05-01 DIAGNOSIS — N179 Acute kidney failure, unspecified: Secondary | ICD-10-CM

## 2019-05-01 MED ORDER — TORSEMIDE 20 MG PO TABS: 40.0000 mg | ORAL_TABLET | Freq: Every day | ORAL | 1 refills | Status: AC

## 2019-05-01 NOTE — Addendum Note (Signed)
Encounter addended by: Valeda Malm, RN on: 05/01/2019 12:46 PM  Actions taken: Diagnosis association updated, Order list changed, Clinical Note Signed

## 2019-05-01 NOTE — Patient Instructions (Signed)
Your physician has requested that you have an echocardiogram. Echocardiography is a painless test that uses sound waves to create images of your heart. It provides your doctor with information about the size and shape of your heart and how well your heart's chambers and valves are working. This procedure takes approximately one hour. There are no restrictions for this procedure.  Follow up in 3 months with an ECHO and Dr Haroldine Laws   A refill for torsemide was sent to [facility]

## 2019-05-01 NOTE — Progress Notes (Signed)
Heart Failure TeleHealth Note  Due to national recommendations of social distancing due to [place] 19, Audio/video telehealth visit is felt to be most appropriate for this patient at this time.  See MyChart message from today for patient consent regarding telehealth for [facility].  Date:  05/01/2019   [name], DOB Jan 17, 1951, MRN [id number]  Location: Home  Provider location: [place] [facility] Type of Visit: Established patient   PCP:  Perrin Maltese, MD  Cardiologist:  No primary care provider on file. Primary HF: Dr Haroldine Laws  Chief Complaint: Heart Failure   History of Present Illness: [name] is a 68 y.o. female with a history of CAD s/p LAD stent, chronic systolic CHF, DM2, neuropathy, gastroparesis, anxiety, panic attacks, and breast cancer s/p right mastectomy. Admitted 3/9 to 03/13/19 with osteomyelitis in open right ankle fracture. Underwent Right BKA. She became septic and required pressors. Also had peri-op NSTEMI. HF team consulted and medications optimized as BP and renal function improved. Had [facility], see below. Also had AKI, which slowly improved. Admitted to CIR 3/16-03/30/19.  LHC/RHC (3/14): Hemodynamics:  LV 101/39 Ao 92/50 RA 14 CO (Fick) 6.28 CI (Fick) 2.92 Diagnostic  Dominance: Right  Left Main  Minimal disease.  Left Anterior Descending  Large LAD wrapping around apex. There is a patent proximal LAD stent. Diffuse luminal irregularities. There was probably a diagonal originating from the mid LAD that was occluded [place].  Ramus Intermedius  Small to moderate ramus with 95% ostial/proximal stenosis.  Left Circumflex  50% stenosis mid LCx and 50% stenosis distal LCx. 50% stenosis in proximal small-moderate OM1.  Right Coronary Artery  50% stenosis distal RCA/ostial PLV.   Seen in ED with acute urinary retention on 4/7. Creatinine had increased from 1.4 on 3/30 to 4.9. UA negative. Foley catheter  was placed.There is a note from [facility] PT that she was orthostatic on 4/8. BP not charted.  She had a telehealth visit on 4/13. [name] was stopped with AKI (creatinine up to 4.9).   She presents via Engineer, civil (consulting) for a telehealth visit today.  On 04/17/19 she had telehealth visit and was to start 12.5 toprol xl. dhe did not start digoxin. Overall feeling fine. Able to move around in the wheel chair.  Denies SOB/PND/Orthopnea. No bleeding issues. Appetite ok. No fever or chills. She is unable to weigh due to BKA. Incision has healed Taking all medications.  Her daughter helps with medications. [name] vision problems. Has hard time getting around and will need help with transportation from a friend due to amputation.   she denies symptoms worrisome for COVID 19.   Past Medical History:  Diagnosis Date  . Below-knee amputation of right lower extremity ([place]) 03/06/2019  . 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 CHF (congestive heart failure) ([place])   . Complication of anesthesia   . Coronary artery disease   . Diabetes mellitus without complication ([place])    type 2   . Fracture    right ankle  . Fracture of humerus, proximal, left, closed 02/23/2014  . Full dentures   . Myocardial infarct ([place])   . Neuropathic pain   . Panic attacks    Hx: of  . PONV (postoperative nausea and vomiting)   . Wears glasses    Past Surgical History:  Procedure Laterality Date  . APPENDECTOMY    . BREAST BIOPSY Left 02/12/15   benign, clip placed  . BREAST  SURGERY Right 2012   Right mastectomy with SLN, DCIS, grade 1-2. ER/PR not performed.   Marland Kitchen CARDIAC CATHETERIZATION    . cardiac stents  2007  . CHOLECYSTECTOMY    . COLON SURGERY     Subtotal colectomy for colonic inertia.  . COLONOSCOPY    . DILATION AND CURETTAGE OF UTERUS    . EYE SURGERY    . LEFT HEART CATH AND CORONARY ANGIOGRAPHY Left 01/14/2018   Procedure: LEFT HEART CATH AND CORONARY ANGIOGRAPHY;  Surgeon: Dionisio David, MD;   Location: [place] CV LAB;  Service: Cardiovascular;  Laterality: Left;  . LEFT HEART CATH AND CORONARY ANGIOGRAPHY N/A 03/10/2019   Procedure: LEFT HEART CATH AND CORONARY ANGIOGRAPHY;  Surgeon: Larey Dresser, MD;  Location: [place] CV LAB;  Service: Cardiovascular;  Laterality: N/A;  . MASTECTOMY Right 2012   Hx: of right breast, DCIS   . MULTIPLE TOOTH EXTRACTIONS    . ORIF ANKLE FRACTURE Right 03/06/2019   Procedure: RIGHT BELOW KNEE AMPUTATION;  Surgeon: Marchia Bond, MD;  Location: [place];  Service: Orthopedics;  Laterality: Right;  . ORIF HUMERUS FRACTURE Left 02/23/2014   Procedure: OPEN REDUCTION INTERNAL FIXATION (ORIF) PROXIMAL HUMERUS FRACTURE;  Surgeon: Johnny Bridge, MD;  Location: [place];  Service: Orthopedics;  Laterality: Left;  . TUBAL LIGATION       Current Outpatient Medications  Medication Sig Dispense Refill  . aspirin EC 81 MG EC tablet Take 1 tablet (81 mg total) by mouth daily.    Marland Kitchen atorvastatin (LIPITOR) 40 MG tablet Take 1 tablet (40 mg total) by mouth at bedtime. 30 tablet 0  . Cholecalciferol (VITAMIN D) 50 MCG (2000 UT) CAPS Take 1 capsule (2,000 Units total) by mouth daily. (Patient taking differently: Take 2,000 Units by mouth at bedtime. ) 30 capsule 0  . clopidogrel (PLAVIX) 75 MG tablet Take 1 tablet (75 mg total) by mouth daily with breakfast. 30 tablet 0  . gabapentin (NEURONTIN) 600 MG tablet Take 1,200 mg by mouth 3 (three) times daily.    . insulin aspart (NOVOLOG) 100 UNIT/ML FlexPen Inject 7 Units into the skin 3 (three) times daily with meals. (Patient taking differently: Inject 0-50 Units into the skin 2 (two) times daily. Per sliding scale) 15 mL 11  . Insulin Glargine (LANTUS) 100 UNIT/ML Solostar Pen Inject 53 Units into the skin 2 (two) times daily. (Patient taking differently: Inject 80 Units into the skin 2 (two) times daily. ) 15 mL 11  . metoCLOPramide (REGLAN) 10 MG tablet Take 10 mg by mouth at bedtime.     . metoCLOPramide  (REGLAN) 5 MG tablet Take 1 tablet (5 mg total) by mouth at bedtime. 30 tablet 0  . metoprolol succinate (TOPROL-XL) 25 MG 24 hr tablet Take 0.5 tablets (12.5 mg total) by mouth daily. 45 tablet 1  . pantoprazole (PROTONIX) 40 MG tablet Take 1 tablet (40 mg total) by mouth daily. 30 tablet 0  . potassium chloride SA (K-DUR) 20 MEQ tablet Take 1 tablet (20 mEq total) by mouth daily. 90 tablet 3  . spironolactone (ALDACTONE) 25 MG tablet Take 0.5 tablets (12.5 mg total) by mouth at bedtime. 45 tablet 3  . torsemide (DEMADEX) 20 MG tablet Take 2 tablets (40 mg total) by mouth daily. 30 tablet 1  . traMADol (ULTRAM) 50 MG tablet Take 2 tablets (100 mg total) by mouth 3 (three) times daily. 540 tablet 0  . digoxin (LANOXIN) 0.125 MG tablet Take 1 tablet (0.125  mg total) by mouth daily. (Patient not taking: Reported on 05/01/2019) 90 tablet 3   No current facility-administered medications for this encounter.     Allergies:   Cymbalta [duloxetine hcl] and Toradol [ketorolac tromethamine]   Social History:  The patient  reports that she has never smoked. She has never used smokeless tobacco. She reports that she does not drink alcohol or use drugs.   Family History:  The patient's family history includes Diabetes in her brother and mother; Hypertension in her mother; Stroke in her mother.   ROS:  Please see the history of present illness.   All other systems are personally reviewed and negative.   Exam:  Tele Health Call; Exam is subjective  General:  Speaks in full sentences. No resp difficulty. Lungs: Normal respiratory effort with conversation.  Abdomen: Non-distended per patient report Extremities: Pt denies edema. Neuro: Alert & oriented x 3.   Recent Labs: 02/09/2019: B Natriuretic Peptide 706.0 03/14/2019: ALT 19 03/27/2019: Magnesium 1.8 04/04/2019: BUN 60; Creatinine, Ser 4.90; Hemoglobin 10.7; Platelets 198; Potassium 4.9; Sodium 128  Personally reviewed   Wt Readings from Last 3  Encounters:  03/30/19 86.7 kg (191 lb 2.2 oz)  03/13/19 91 kg (200 lb 9.9 oz)  02/09/19 99.8 kg (220 lb)      ASSESSMENT AND PLAN:  1. CAD: Patient has history of prior anterior MI with LAD stent. She developed hypotension but no significant ECG changes or chest pain post-op 3/9. Troponin up to 29. Suspect peri-op NSTEMI. LHC on 3/13 showed 95% stenosis ostial/proximal small-moderate ramus and suspected ostial diagonal occlusion. One of these vessels may have been culprit lesion, no obstructive disease in the major coronaries, no interventional target (ramus too small for stent placement). - No s/s ischemia   - Continue ASA and statin.  -Continue Plavix 75 mg daily. -Continue Toprol XL 12.5 mg qHS 2.Chronic systolic CHF: Ischemic cardiomyopathy.Baseline EF is in the 25-30% range. Echo 03/07/19:EF 20-25%.  - Functional class seems NYHA II but she is in a wheel chair. Volume status sounds stable - Continue torsemide 40 mg daily. - Continue Toprol XL 12.5 mg qHS - Keep off spiro for now. Not sure what happened.  - Off Entresto with AKI and soft BP. 4. AKIwith acute urinary retention - Creatinine had improved to 1.40 on 3/30, up to 4.9 on 4/7 in setting of urinary retention. We had repeat labs last week and I believe creatinine was around 2.5, but cannot locate the faxed in copy.  - Foley has been removed and she is urinating with no problems. -5. Right BKA    COVID screen The patient does not have any symptoms that suggest any further testing/ screening at this time.  Social distancing reinforced today.  Patient Risk: After full review of this patients clinical status, I feel that they are at moderate risk for cardiac decompensation at this time.  Relevant cardiac medications were reviewed at length with the patient today. The patient does not have concerns regarding their medications at this time.   The following changes were made today: No  Recommended follow-up: Follow  up in 3 months with an ECHO and Dr Haroldine Laws. She does not want a visit any sooner.   Today, I have spent 23 minutes with the patient with telehealth technology discussing the above issues .    Jeanmarie Hubert, NP  05/01/2019 12:17 PM  [place] [address] Heart and [place] [address] [phone] (office) (  [phone] (fax)

## 2019-05-01 NOTE — Progress Notes (Signed)
Called patient, discussed AVS. No questions endorsed. Pt appreciative.

## 2019-05-08 ENCOUNTER — Other Ambulatory Visit (HOSPITAL_COMMUNITY): Payer: Self-pay | Admitting: Internal Medicine

## 2019-05-12 ENCOUNTER — Telehealth: Payer: Self-pay

## 2019-05-12 NOTE — Telephone Encounter (Signed)
Patient called stating that PCP diagnosed her with CHF and was told to contact her Cardiologist. Patient recently on Rehab unit for leg amputated. Let pt know the Cardiologist on her discharge summary and told her to call them to f/u.

## 2019-05-17 ENCOUNTER — Inpatient Hospital Stay: Payer: Medicare Other | Admitting: Physical Medicine & Rehabilitation

## 2019-05-26 ENCOUNTER — Encounter: Payer: Medicare Other | Admitting: Physical Medicine & Rehabilitation

## 2019-06-06 ENCOUNTER — Ambulatory Visit: Payer: Medicare Other | Admitting: Anesthesiology

## 2019-06-09 ENCOUNTER — Other Ambulatory Visit: Payer: Self-pay | Admitting: Internal Medicine

## 2019-06-09 DIAGNOSIS — I509 Heart failure, unspecified: Secondary | ICD-10-CM

## 2019-06-12 ENCOUNTER — Other Ambulatory Visit: Payer: Self-pay | Admitting: Cardiovascular Disease

## 2019-06-12 DIAGNOSIS — R0602 Shortness of breath: Secondary | ICD-10-CM

## 2019-06-14 ENCOUNTER — Other Ambulatory Visit: Payer: Self-pay

## 2019-06-14 ENCOUNTER — Ambulatory Visit
Admission: RE | Admit: 2019-06-14 | Discharge: 2019-06-14 | Disposition: A | Discharge status: Home / Self Care | Payer: Medicare Other | Source: Ambulatory Visit | Attending: Internal Medicine | Admitting: Internal Medicine

## 2019-06-14 DIAGNOSIS — Z89511 Acquired absence of right leg below knee: Secondary | ICD-10-CM | POA: Insufficient documentation

## 2019-06-14 DIAGNOSIS — I2510 Atherosclerotic heart disease of native coronary artery without angina pectoris: Secondary | ICD-10-CM | POA: Diagnosis not present

## 2019-06-14 DIAGNOSIS — I080 Rheumatic disorders of both mitral and aortic valves: Secondary | ICD-10-CM | POA: Insufficient documentation

## 2019-06-14 DIAGNOSIS — E119 Type 2 diabetes mellitus without complications: Secondary | ICD-10-CM | POA: Insufficient documentation

## 2019-06-14 DIAGNOSIS — R0602 Shortness of breath: Secondary | ICD-10-CM | POA: Insufficient documentation

## 2019-06-14 DIAGNOSIS — I509 Heart failure, unspecified: Secondary | ICD-10-CM | POA: Insufficient documentation

## 2019-06-14 DIAGNOSIS — I252 Old myocardial infarction: Secondary | ICD-10-CM | POA: Diagnosis not present

## 2019-06-14 NOTE — Progress Notes (Signed)
*  PRELIMINARY RESULTS* Echocardiogram 2D Echocardiogram has been performed.  [name] 06/14/2019, 11:01 AM

## 2019-07-05 ENCOUNTER — Encounter: Payer: Self-pay | Admitting: Anesthesiology

## 2019-07-05 ENCOUNTER — Other Ambulatory Visit: Payer: Self-pay

## 2019-07-05 ENCOUNTER — Ambulatory Visit: Payer: Medicare Other | Attending: Anesthesiology | Admitting: Anesthesiology

## 2019-07-05 DIAGNOSIS — E1142 Type 2 diabetes mellitus with diabetic polyneuropathy: Secondary | ICD-10-CM

## 2019-07-05 DIAGNOSIS — F451 Undifferentiated somatoform disorder: Secondary | ICD-10-CM

## 2019-07-05 DIAGNOSIS — I2510 Atherosclerotic heart disease of native coronary artery without angina pectoris: Secondary | ICD-10-CM

## 2019-07-05 DIAGNOSIS — G8918 Other acute postprocedural pain: Secondary | ICD-10-CM | POA: Diagnosis not present

## 2019-07-05 DIAGNOSIS — M792 Neuralgia and neuritis, unspecified: Secondary | ICD-10-CM

## 2019-07-05 MED ORDER — TRAMADOL HCL 50 MG PO TABS: 100.0000 mg | ORAL_TABLET | Freq: Three times a day (TID) | ORAL | 0 refills | Status: DC

## 2019-07-05 NOTE — Progress Notes (Signed)
Virtual Visit via Video Note  I connected with [name] on 07/05/19 at 10:00 AM EDT by a video enabled telemedicine application and verified that I am speaking with the correct person using two identifiers.  Location: Patient: Home Provider: Pain control center   I discussed the limitations of evaluation and management by telemedicine and the availability of in person appointments. The patient expressed understanding and agreed to proceed.  History of Present Illness: I spoke with [name] via Risk analyst today.  Her chronic pain syndrome has been stable and she is continuing to take her tramadol 50 mg tablets 2 at a time 3 times daily.  This continues to work well for her.  No changes are noted in the quality of her pain and this is been stable.  Medications continue to give her good functional benefit without side effect.  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  This occurred back in March of the year.  Otherwise she is in her usual state of health at this time.    Observations/Objective:  Current Outpatient Medications:  .  aspirin EC 81 MG EC tablet, Take 1 tablet (81 mg total) by mouth daily., Disp: , Rfl:  .  atorvastatin (LIPITOR) 40 MG tablet, Take 1 tablet (40 mg total) by mouth at bedtime., Disp: 30 tablet, Rfl: 0 .  Cholecalciferol (VITAMIN D) 50 MCG (2000 UT) CAPS, Take 1 capsule (2,000 Units total) by mouth daily. (Patient taking differently: Take 2,000 Units by mouth at bedtime. ), Disp: 30 capsule, Rfl: 0 .  clopidogrel (PLAVIX) 75 MG tablet, Take 1 tablet (75 mg total) by mouth daily with breakfast., Disp: 30 tablet, Rfl: 0 .  digoxin (LANOXIN) 0.125 MG tablet, Take 1 tablet (0.125 mg total) by mouth daily. (Patient not taking: Reported on 05/01/2019), Disp: 90 tablet, Rfl: 3 .  gabapentin (NEURONTIN) 600 MG tablet, Take 1,200 mg  by mouth 3 (three) times daily., Disp: , Rfl:  .  insulin aspart (NOVOLOG) 100 UNIT/ML FlexPen, Inject 7 Units into the skin 3 (three) times daily with meals. (Patient taking differently: Inject 0-50 Units into the skin 2 (two) times daily. Per sliding scale), Disp: 15 mL, Rfl: 11 .  Insulin Glargine (LANTUS) 100 UNIT/ML Solostar Pen, Inject 53 Units into the skin 2 (two) times daily. (Patient taking differently: Inject 80 Units into the skin 2 (two) times daily. ), Disp: 15 mL, Rfl: 11 .  metoCLOPramide (REGLAN) 10 MG tablet, Take 10 mg by mouth at bedtime. , Disp: , Rfl:  .  metoCLOPramide (REGLAN) 5 MG tablet, Take 1 tablet (5 mg total) by mouth at bedtime., Disp: 30 tablet, Rfl: 0 .  metoprolol succinate (TOPROL-XL) 25 MG 24 hr tablet, Take 0.5 tablets (12.5 mg total) by mouth daily., Disp: 45 tablet, Rfl: 1 .  pantoprazole (PROTONIX) 40 MG tablet, Take 1 tablet (40 mg total) by mouth daily., Disp: 30 tablet, Rfl: 0 .  potassium chloride SA (K-DUR) 20 MEQ tablet, Take 1 tablet (20 mEq total) by mouth daily., Disp: 90 tablet, Rfl: 3 .  spironolactone (ALDACTONE) 25 MG tablet, Take 0.5 tablets (12.5 mg total) by mouth at bedtime., Disp: 45 tablet, Rfl: 3 .  torsemide (DEMADEX) 20 MG tablet, Take 2 tablets (40 mg total) by mouth daily., Disp: 180 tablet, Rfl: 1 .  traMADol (ULTRAM) 50 MG tablet, Take 2 tablets (100  mg total) by mouth 3 (three) times daily., Disp: 540 tablet, Rfl: 0  Assessment and Plan: 1. Neuropathic pain of foot, right   2. Neuropathic pain of hand, unspecified laterality   3. Somatic symptom disorder, persistent, moderate, with predominant pain   4. Neuropathic pain of foot, left   5. Diabetic peripheral neuropathy ([place])   6. Postoperative pain   Based on our discussion today and upon review of the [facility] practitioner database information I am going to refill her tramadol for 2 tablets 3 times a day for the next 3 months.  I will schedule her for a virtual video  conferencing in 3 months.  She is to continue follow-up with her primary care physicians for her baseline medical care and her orthopedic physicians to.  Follow Up Instructions:    I discussed the assessment and treatment plan with the patient. The patient was provided an opportunity to ask questions and all were answered. The patient agreed with the plan and demonstrated an understanding of the instructions.   The patient was advised to call back or seek an in-person evaluation if the symptoms worsen or if the condition fails to improve as anticipated.  I provided 30 minutes of non-face-to-face time during this encounter.   Molli Barrows, MD

## 2019-08-04 ENCOUNTER — Other Ambulatory Visit (HOSPITAL_COMMUNITY): Payer: Medicare Other

## 2019-08-04 ENCOUNTER — Encounter (HOSPITAL_COMMUNITY): Payer: Medicare Other | Admitting: Internal Medicine

## 2019-10-02 ENCOUNTER — Other Ambulatory Visit: Payer: Self-pay

## 2019-10-02 ENCOUNTER — Encounter: Payer: Self-pay | Admitting: Anesthesiology

## 2019-10-02 ENCOUNTER — Ambulatory Visit: Payer: Medicare Other | Attending: Anesthesiology | Admitting: Anesthesiology

## 2019-10-02 DIAGNOSIS — F451 Undifferentiated somatoform disorder: Secondary | ICD-10-CM | POA: Diagnosis not present

## 2019-10-02 DIAGNOSIS — G8918 Other acute postprocedural pain: Secondary | ICD-10-CM | POA: Diagnosis not present

## 2019-10-02 DIAGNOSIS — M792 Neuralgia and neuritis, unspecified: Secondary | ICD-10-CM | POA: Diagnosis not present

## 2019-10-02 DIAGNOSIS — E1142 Type 2 diabetes mellitus with diabetic polyneuropathy: Secondary | ICD-10-CM | POA: Diagnosis not present

## 2019-10-02 MED ORDER — TRAMADOL HCL 50 MG PO TABS: 100.0000 mg | ORAL_TABLET | Freq: Three times a day (TID) | ORAL | 0 refills | Status: AC

## 2019-10-02 NOTE — Progress Notes (Signed)
Virtual Visit via Telephone Note  I connected with [name] on 10/02/19 at  2:30 PM EDT by telephone and verified that I am speaking with the correct person using two identifiers.  Location: Patient: Home Provider: Pain control center   I discussed the limitations, risks, security and privacy concerns of performing an evaluation and management service by telephone and the availability of in person appointments. I also discussed with the patient that there may be a patient responsible charge related to this service. The patient expressed understanding and agreed to proceed.   History of Present Illness: I spoke with [name] via telephone conferencing today.  This is for her virtual visit follow-up at the three-month mark.  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  She is tolerating this reasonably well and still taking her tramadol which works effectively for her at 100 mg 3 times a day.  She denies any side effects with the medication is otherwise been doing well.  She is trying to stay active and the medications enable her to do this.  No changes in the quality of the pain are otherwise noted and she is having no problems with the medications.  Otherwise she is in her usual state of health    Observations/Objective:  Current Outpatient Medications:  .  aspirin EC 81 MG EC tablet, Take 1 tablet (81 mg total) by mouth daily., Disp: , Rfl:  .  atorvastatin (LIPITOR) 40 MG tablet, Take 1 tablet (40 mg total) by mouth at bedtime., Disp: 30 tablet, Rfl: 0 .  Cholecalciferol (VITAMIN D) 50 MCG (2000 UT) CAPS, Take 1 capsule (2,000 Units total) by mouth daily. (Patient taking differently: Take 2,000 Units by mouth at bedtime. ), Disp: 30 capsule, Rfl: 0 .  clopidogrel (PLAVIX) 75 MG tablet, Take 1 tablet (75 mg total) by mouth daily with breakfast., Disp: 30 tablet, Rfl: 0 .  digoxin (LANOXIN) 0.125 MG tablet, Take 1 tablet (0.125 mg  total) by mouth daily. (Patient not taking: Reported on 05/01/2019), Disp: 90 tablet, Rfl: 3 .  gabapentin (NEURONTIN) 600 MG tablet, Take 1,200 mg by mouth 3 (three) times daily., Disp: , Rfl:  .  insulin aspart (NOVOLOG) 100 UNIT/ML FlexPen, Inject 7 Units into the skin 3 (three) times daily with meals. (Patient taking differently: Inject 0-50 Units into the skin 2 (two) times daily. Per sliding scale), Disp: 15 mL, Rfl: 11 .  Insulin Glargine (LANTUS) 100 UNIT/ML Solostar Pen, Inject 53 Units into the skin 2 (two) times daily. (Patient taking differently: Inject 80 Units into the skin 2 (two) times daily. ), Disp: 15 mL, Rfl: 11 .  metoCLOPramide (REGLAN) 10 MG tablet, Take 10 mg by mouth at bedtime. , Disp: , Rfl:  .  metoCLOPramide (REGLAN) 5 MG tablet, Take 1 tablet (5 mg total) by mouth at bedtime., Disp: 30 tablet, Rfl: 0 .  metoprolol succinate (TOPROL-XL) 25 MG 24 hr tablet, Take 0.5 tablets (12.5 mg total) by mouth daily., Disp: 45 tablet, Rfl: 1 .  pantoprazole (PROTONIX) 40 MG tablet, Take 1 tablet (40 mg total) by mouth daily., Disp: 30 tablet, Rfl: 0 .  potassium chloride SA (K-DUR) 20 MEQ tablet, Take 1 tablet (20 mEq total) by mouth daily., Disp: 90 tablet, Rfl: 3 .  spironolactone (ALDACTONE) 25 MG tablet, Take 0.5 tablets (12.5 mg total) by mouth at bedtime., Disp: 45 tablet, Rfl: 3 .  torsemide (DEMADEX) 20 MG tablet, Take 2  tablets (40 mg total) by mouth daily., Disp: 180 tablet, Rfl: 1 .  traMADol (ULTRAM) 50 MG tablet, Take 2 tablets (100 mg total) by mouth 3 (three) times daily., Disp: 540 tablet, Rfl: 0  Assessment and Plan: 1. Neuropathic pain of foot, left   2. Neuropathic pain of hand, unspecified laterality   3. Diabetic peripheral neuropathy ([place])   4. Somatic symptom disorder, persistent, moderate, with predominant pain   5. Postoperative pain   Based on our discussion today and upon review of the [facility] practitioner database information going to refill her  medications for the next 3 months with a return to clinic scheduled for 3 months.  If she should have any problems in the meantime she has been instructed to contact us.  We also encouraged her to follow-up with her primary care physicians for baseline medical care with schedule return in 3 months.   Follow Up Instructions:    I discussed the assessment and treatment plan with the patient. The patient was provided an opportunity to ask questions and all were answered. The patient agreed with the plan and demonstrated an understanding of the instructions.   The patient was advised to call back or seek an in-person evaluation if the symptoms worsen or if the condition fails to improve as anticipated.  I provided 30 minutes of non-face-to-face time during this encounter.   Molli Barrows, MD

## 2019-10-24 ENCOUNTER — Other Ambulatory Visit: Payer: Self-pay | Admitting: Nurse Practitioner

## 2019-10-24 DIAGNOSIS — Z1231 Encounter for screening mammogram for malignant neoplasm of breast: Secondary | ICD-10-CM

## 2019-12-29 DEATH — deceased

## 2020-01-15 ENCOUNTER — Inpatient Hospital Stay: Admission: RE | Admit: 2020-01-15 | Payer: Medicare Other | Source: Ambulatory Visit

## 2020-07-05 IMAGING — CR DG CHEST 2V
1 series · 2 of 2 positions shown · non-contrast
Comparison: Radiographs October 13, 2013.

CLINICAL DATA: Shortness of breath.

EXAM:
CHEST - 2 VIEW

[Series 1: w chest pa · 0.14mm/px · 2 of 2 slices shown]
[im 1/2]
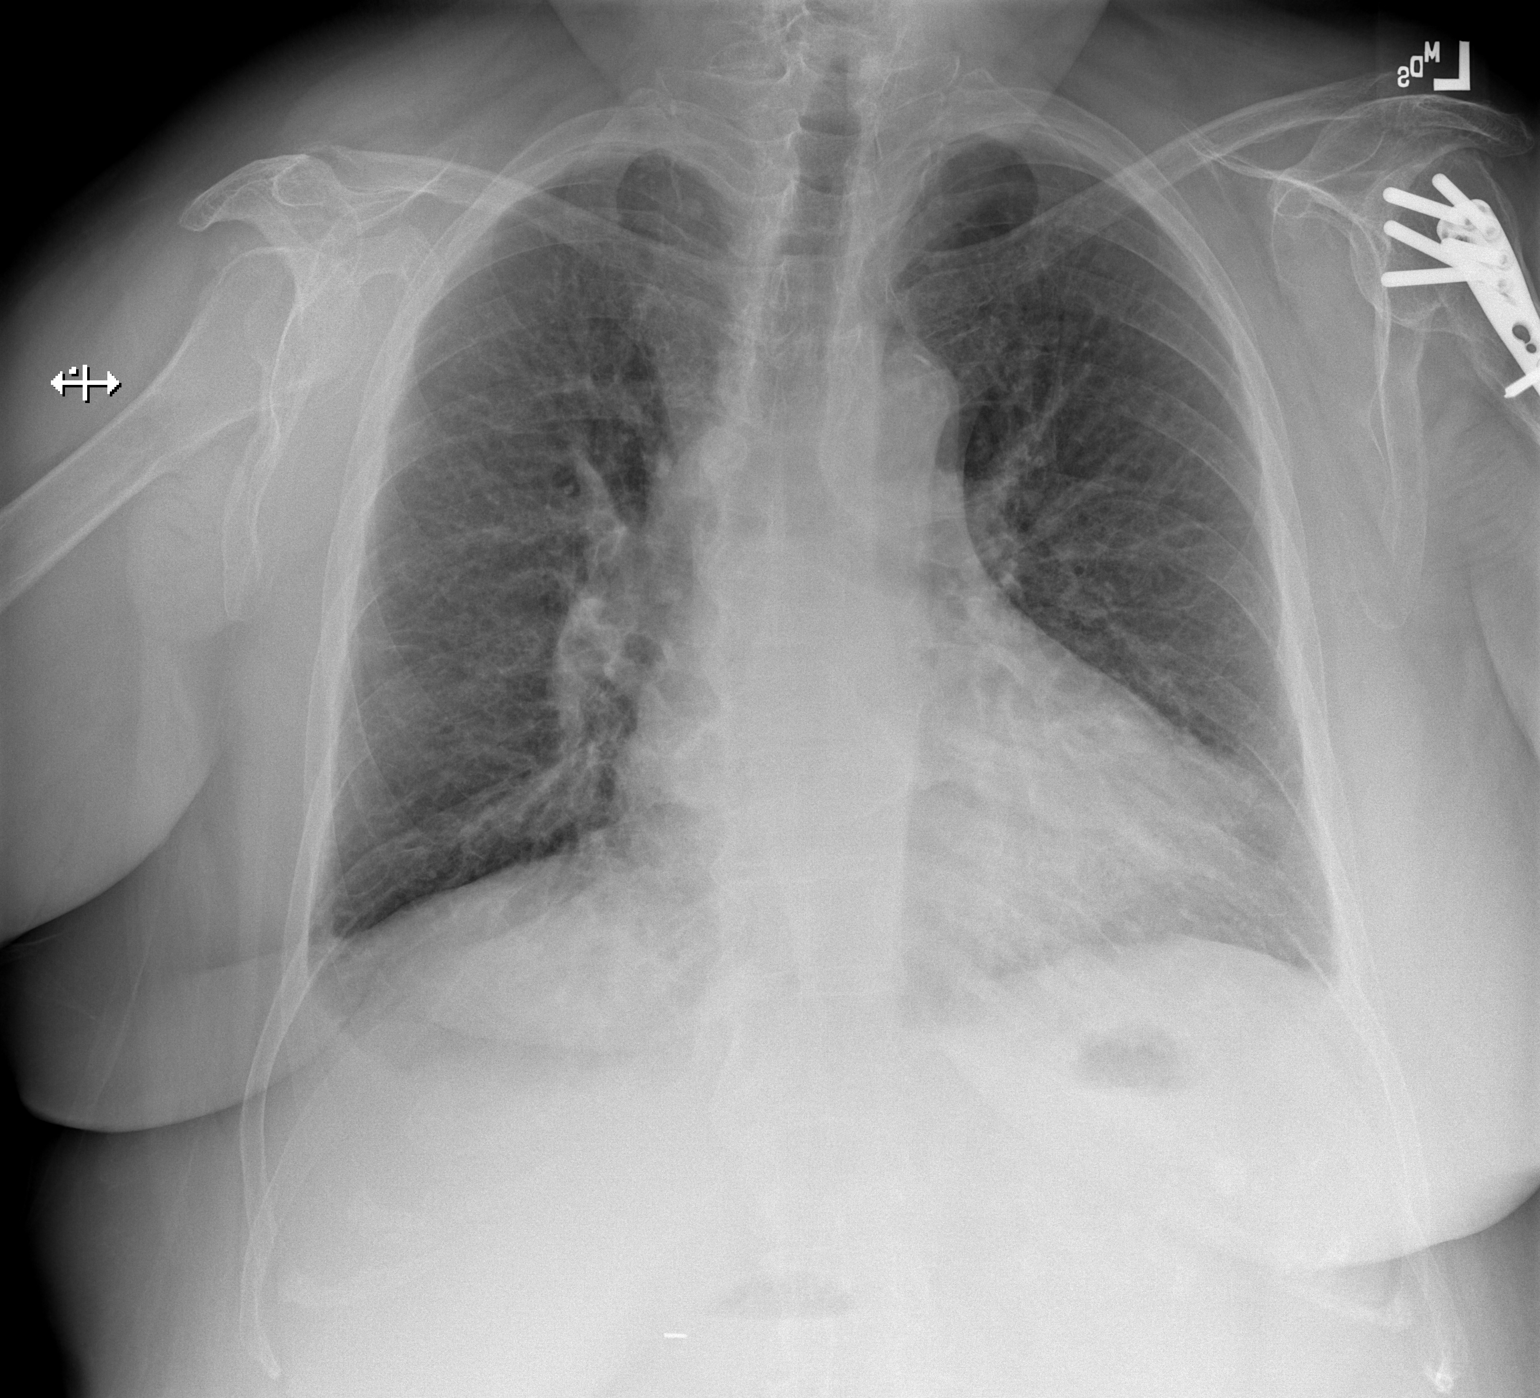
[im 2/2]
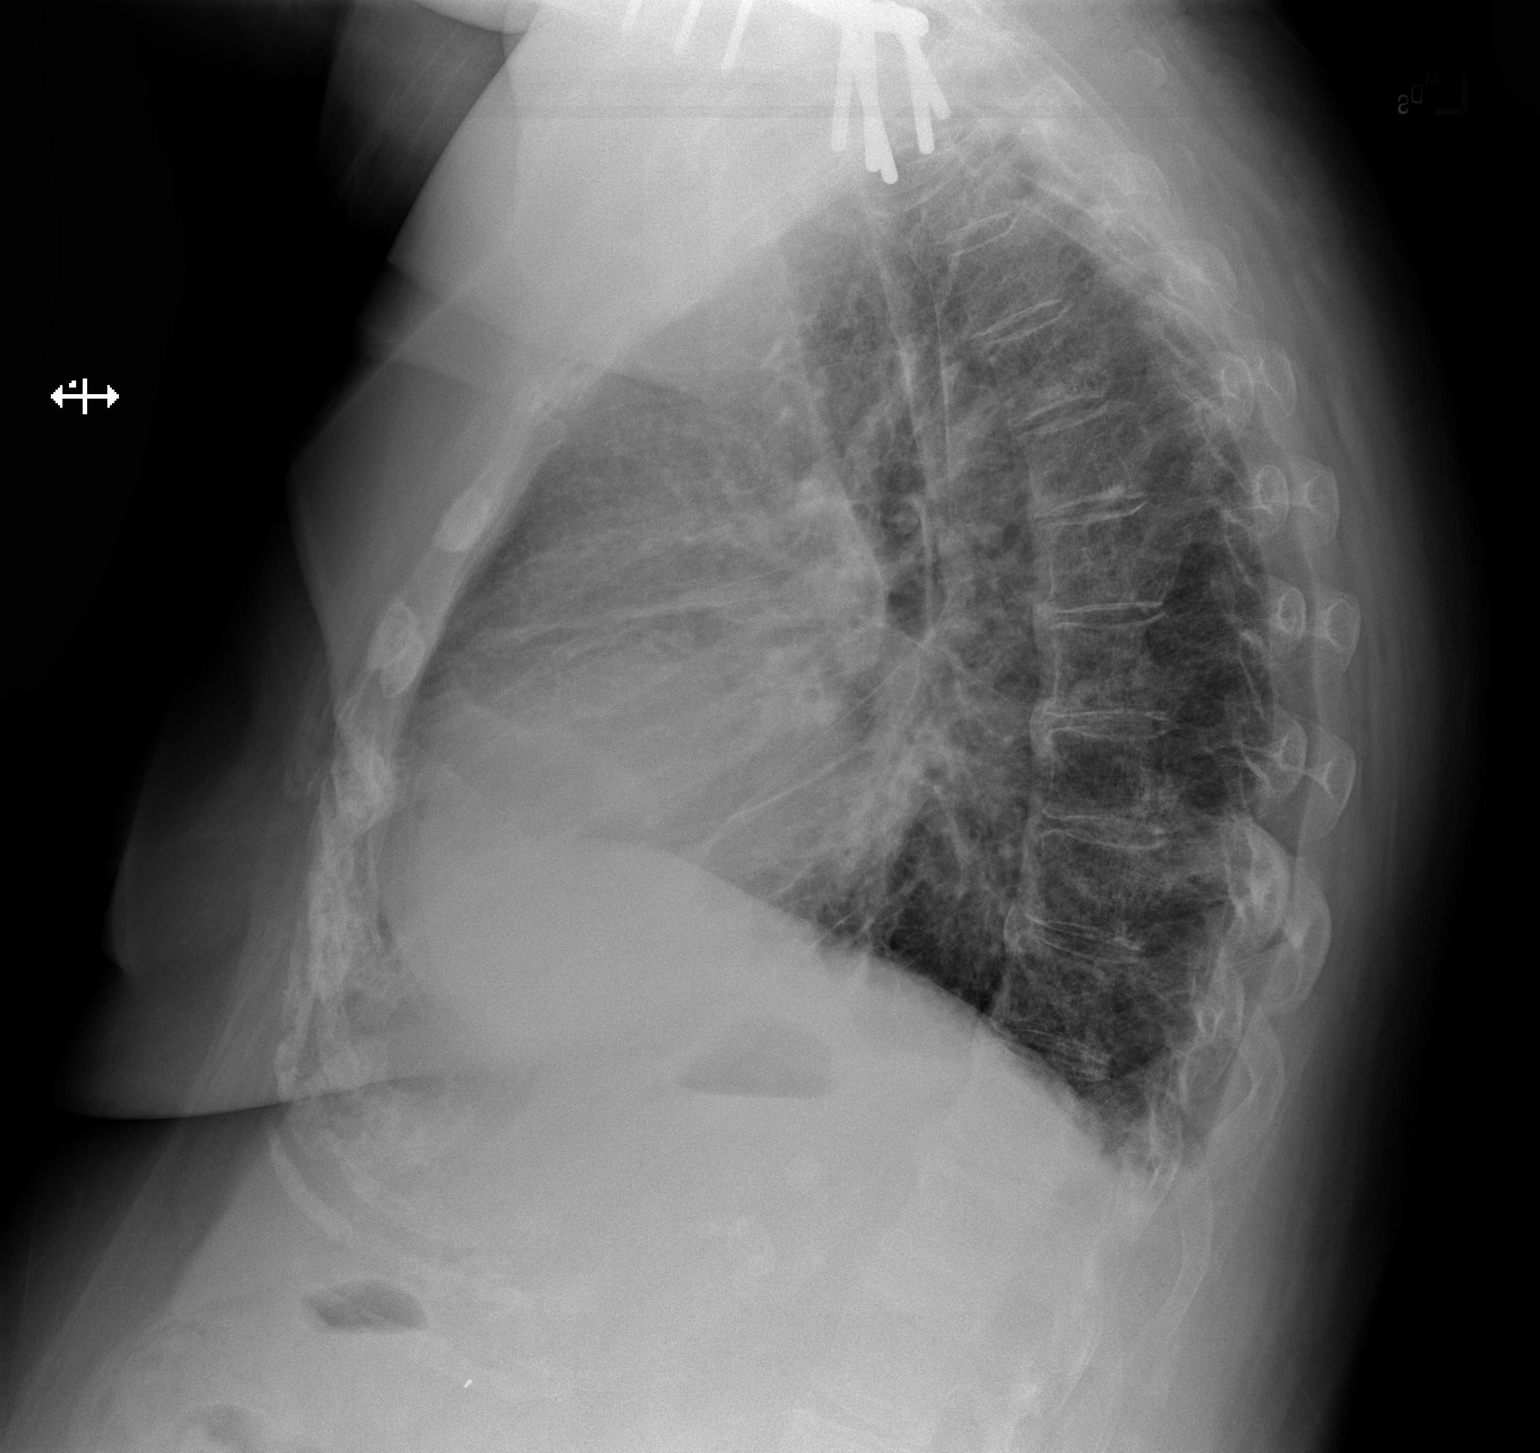

[2 of 2 positions shown; findings below may reference images not displayed]

FINDINGS: Stable cardiomegaly with central pulmonary vascular congestion. No
pneumothorax is noted. No consolidative process is noted. Minimal
pleural effusions may be present. The visualized skeletal structures
are unremarkable.
IMPRESSION: Stable cardiomegaly with central pulmonary vascular congestion.
Minimal pleural effusions may be present.

## 2020-07-30 IMAGING — DX PORTABLE CHEST - 2 VIEW
1 series · 1 of 1 positions shown · non-contrast
Comparison: 02/09/2019

CLINICAL DATA: Hypoxemia

EXAM:
CHEST  2 VIEW PORTABLE

[chest]
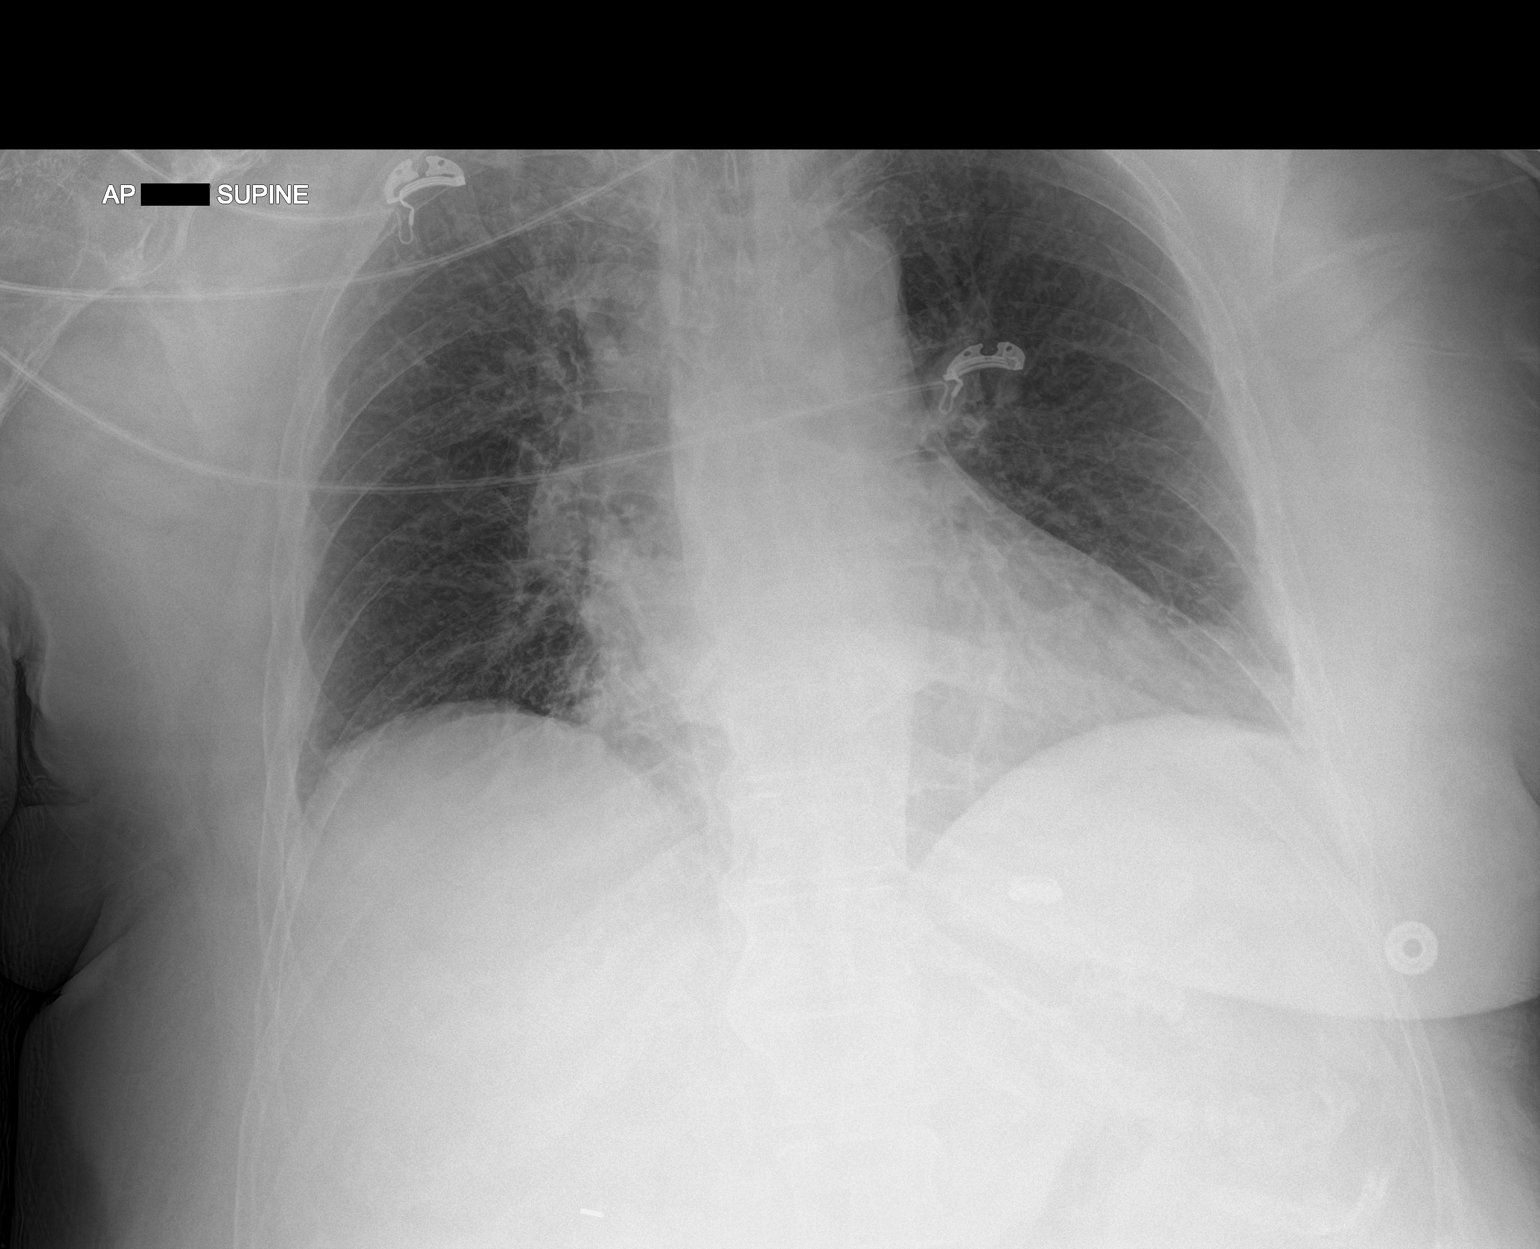

[1 of 1 positions shown; findings below may reference images not displayed]

FINDINGS: Mild cardiomegaly. No focal airspace disease or effusion. Aortic
atherosclerosis. No pneumothorax. Surgical plate and hardware in the
left humerus.
IMPRESSION: No active cardiopulmonary disease.  Stable cardiomegaly.
